# Patient Record
Sex: Male | Born: 1935 | Hispanic: No | Smoking: Never smoker
Health system: Southern US, Community
[De-identification: ages and names within clinical notes are randomized; demographics above are authoritative.]

## PROBLEM LIST (undated history)

## (undated) DIAGNOSIS — H409 Unspecified glaucoma: Secondary | ICD-10-CM

## (undated) DIAGNOSIS — I739 Peripheral vascular disease, unspecified: Secondary | ICD-10-CM

## (undated) DIAGNOSIS — G4730 Sleep apnea, unspecified: Secondary | ICD-10-CM

## (undated) DIAGNOSIS — J45909 Unspecified asthma, uncomplicated: Secondary | ICD-10-CM

## (undated) DIAGNOSIS — I2510 Atherosclerotic heart disease of native coronary artery without angina pectoris: Secondary | ICD-10-CM

## (undated) DIAGNOSIS — E785 Hyperlipidemia, unspecified: Secondary | ICD-10-CM

## (undated) DIAGNOSIS — I639 Cerebral infarction, unspecified: Secondary | ICD-10-CM

## (undated) DIAGNOSIS — I509 Heart failure, unspecified: Secondary | ICD-10-CM

## (undated) DIAGNOSIS — M1990 Unspecified osteoarthritis, unspecified site: Secondary | ICD-10-CM

## (undated) DIAGNOSIS — Z8673 Personal history of transient ischemic attack (TIA), and cerebral infarction without residual deficits: Secondary | ICD-10-CM

## (undated) DIAGNOSIS — K219 Gastro-esophageal reflux disease without esophagitis: Secondary | ICD-10-CM

## (undated) DIAGNOSIS — I10 Essential (primary) hypertension: Secondary | ICD-10-CM

## (undated) DIAGNOSIS — F419 Anxiety disorder, unspecified: Secondary | ICD-10-CM

## (undated) HISTORY — DX: Sleep apnea, unspecified: G47.30

## (undated) HISTORY — DX: Unspecified asthma, uncomplicated: J45.909

## (undated) HISTORY — PX: REPLACEMENT TOTAL KNEE BILATERAL: SUR1225

## (undated) HISTORY — DX: Personal history of transient ischemic attack (TIA), and cerebral infarction without residual deficits: Z86.73

## (undated) HISTORY — DX: Unspecified glaucoma: H40.9

## (undated) HISTORY — DX: Unspecified osteoarthritis, unspecified site: M19.90

## (undated) HISTORY — DX: Heart failure, unspecified: I50.9

## (undated) HISTORY — DX: Hyperlipidemia, unspecified: E78.5

## (undated) HISTORY — DX: Essential (primary) hypertension: I10

## (undated) HISTORY — DX: Atherosclerotic heart disease of native coronary artery without angina pectoris: I25.10

## (undated) HISTORY — DX: Anxiety disorder, unspecified: F41.9

## (undated) HISTORY — DX: Peripheral vascular disease, unspecified: I73.9

## (undated) HISTORY — DX: Cerebral infarction, unspecified: I63.9

## (undated) HISTORY — DX: Gastro-esophageal reflux disease without esophagitis: K21.9

---

## 2004-01-18 ENCOUNTER — Ambulatory Visit: Payer: Self-pay

## 2004-12-05 ENCOUNTER — Ambulatory Visit: Payer: Self-pay | Admitting: Internal Medicine

## 2004-12-12 ENCOUNTER — Ambulatory Visit: Payer: Self-pay | Admitting: Internal Medicine

## 2005-01-12 ENCOUNTER — Ambulatory Visit: Payer: Self-pay | Admitting: Internal Medicine

## 2005-06-05 ENCOUNTER — Other Ambulatory Visit: Payer: Self-pay

## 2005-06-13 ENCOUNTER — Ambulatory Visit: Payer: Self-pay | Admitting: Otolaryngology

## 2005-07-28 ENCOUNTER — Emergency Department: Payer: Self-pay | Admitting: Emergency Medicine

## 2005-07-30 ENCOUNTER — Ambulatory Visit: Payer: Self-pay

## 2008-02-04 ENCOUNTER — Inpatient Hospital Stay: Payer: Self-pay | Admitting: Internal Medicine

## 2009-01-18 ENCOUNTER — Ambulatory Visit: Payer: Self-pay | Admitting: Podiatry

## 2009-05-04 ENCOUNTER — Ambulatory Visit: Payer: Self-pay | Admitting: General Practice

## 2009-05-17 ENCOUNTER — Inpatient Hospital Stay: Payer: Self-pay | Admitting: General Practice

## 2009-05-23 ENCOUNTER — Ambulatory Visit: Payer: Self-pay | Admitting: Cardiology

## 2009-12-30 ENCOUNTER — Emergency Department: Payer: Self-pay | Admitting: Emergency Medicine

## 2010-01-16 ENCOUNTER — Ambulatory Visit: Payer: Self-pay | Admitting: Neurology

## 2010-01-18 ENCOUNTER — Ambulatory Visit: Payer: Self-pay | Admitting: Vascular Surgery

## 2010-09-19 ENCOUNTER — Inpatient Hospital Stay: Payer: Self-pay | Admitting: Emergency Medicine

## 2010-09-22 LAB — CANCER ANTIGEN 19-9: CA 19-9: 57 U/mL — ABNORMAL HIGH

## 2010-09-28 LAB — PATHOLOGY REPORT

## 2011-03-23 ENCOUNTER — Emergency Department: Payer: Self-pay | Admitting: Emergency Medicine

## 2011-03-23 LAB — URINALYSIS, COMPLETE
Ketone: NEGATIVE
Nitrite: NEGATIVE
Ph: 5 (ref 4.5–8.0)
Protein: NEGATIVE
RBC,UR: 3 /HPF (ref 0–5)
Specific Gravity: 1.019 (ref 1.003–1.030)
WBC UR: 2 /HPF (ref 0–5)

## 2011-03-23 LAB — CBC
HCT: 50 % (ref 40.0–52.0)
MCV: 91 fL (ref 80–100)
RBC: 5.49 10*6/uL (ref 4.40–5.90)
WBC: 8.5 10*3/uL (ref 3.8–10.6)

## 2011-03-23 LAB — TROPONIN I: Troponin-I: <0.02 ng/mL

## 2011-03-23 LAB — BASIC METABOLIC PANEL
Anion Gap: 12 (ref 7–16)
Calcium, Total: 8.4 mg/dL — ABNORMAL LOW (ref 8.5–10.1)
Co2: 25 mmol/L (ref 21–32)
Creatinine: 1.17 mg/dL (ref 0.60–1.30)
EGFR (African American): >60
EGFR (Non-African Amer.): >60
Glucose: 126 mg/dL — ABNORMAL HIGH (ref 65–99)
Sodium: 150 mmol/L — ABNORMAL HIGH (ref 136–145)

## 2011-04-18 ENCOUNTER — Encounter: Payer: Self-pay | Admitting: Otolaryngology

## 2011-05-06 ENCOUNTER — Encounter: Payer: Self-pay | Admitting: Otolaryngology

## 2011-06-05 ENCOUNTER — Encounter: Payer: Self-pay | Admitting: Otolaryngology

## 2012-01-23 ENCOUNTER — Ambulatory Visit: Payer: Self-pay | Admitting: Unknown Physician Specialty

## 2012-03-18 DIAGNOSIS — N401 Enlarged prostate with lower urinary tract symptoms: Secondary | ICD-10-CM | POA: Insufficient documentation

## 2012-03-18 DIAGNOSIS — R350 Frequency of micturition: Secondary | ICD-10-CM | POA: Insufficient documentation

## 2012-03-18 DIAGNOSIS — R351 Nocturia: Secondary | ICD-10-CM | POA: Insufficient documentation

## 2012-03-18 DIAGNOSIS — N529 Male erectile dysfunction, unspecified: Secondary | ICD-10-CM | POA: Insufficient documentation

## 2012-03-18 DIAGNOSIS — N400 Enlarged prostate without lower urinary tract symptoms: Secondary | ICD-10-CM | POA: Insufficient documentation

## 2012-04-22 ENCOUNTER — Ambulatory Visit: Payer: Self-pay | Admitting: Specialist

## 2012-09-19 ENCOUNTER — Emergency Department: Payer: Self-pay | Admitting: Emergency Medicine

## 2012-09-19 LAB — BASIC METABOLIC PANEL
Anion Gap: 6 — ABNORMAL LOW (ref 7–16)
BUN: 17 mg/dL (ref 7–18)
Calcium, Total: 8.7 mg/dL (ref 8.5–10.1)
Glucose: 129 mg/dL — ABNORMAL HIGH (ref 65–99)
Sodium: 141 mmol/L (ref 136–145)

## 2012-09-19 LAB — CBC
HCT: 50.1 % (ref 40.0–52.0)
HGB: 17.2 g/dL (ref 13.0–18.0)
MCH: 30.8 pg (ref 26.0–34.0)
MCHC: 34.3 g/dL (ref 32.0–36.0)
MCV: 90 fL (ref 80–100)
RBC: 5.57 10*6/uL (ref 4.40–5.90)
RDW: 15.1 % — ABNORMAL HIGH (ref 11.5–14.5)
WBC: 8.5 10*3/uL (ref 3.8–10.6)

## 2012-09-20 LAB — URINALYSIS, COMPLETE
Bacteria: NONE SEEN
Leukocyte Esterase: NEGATIVE
Specific Gravity: 1.013 (ref 1.003–1.030)
WBC UR: 1 /HPF (ref 0–5)

## 2012-09-20 LAB — TROPONIN I: Troponin-I: <0.02 ng/mL

## 2012-09-28 ENCOUNTER — Ambulatory Visit: Payer: Self-pay | Admitting: Internal Medicine

## 2012-09-28 LAB — CREATININE, SERUM
EGFR (African American): >60
EGFR (Non-African Amer.): >60

## 2012-11-11 ENCOUNTER — Ambulatory Visit: Payer: Self-pay | Admitting: Specialist

## 2013-04-06 DIAGNOSIS — N281 Cyst of kidney, acquired: Secondary | ICD-10-CM | POA: Insufficient documentation

## 2013-05-18 ENCOUNTER — Ambulatory Visit: Payer: Self-pay | Admitting: Specialist

## 2013-06-14 DIAGNOSIS — R918 Other nonspecific abnormal finding of lung field: Secondary | ICD-10-CM | POA: Insufficient documentation

## 2013-06-14 DIAGNOSIS — G4733 Obstructive sleep apnea (adult) (pediatric): Secondary | ICD-10-CM | POA: Insufficient documentation

## 2014-03-03 DIAGNOSIS — D696 Thrombocytopenia, unspecified: Secondary | ICD-10-CM | POA: Insufficient documentation

## 2015-07-16 DIAGNOSIS — R82991 Hypocitraturia: Secondary | ICD-10-CM | POA: Insufficient documentation

## 2016-02-16 ENCOUNTER — Ambulatory Visit (INDEPENDENT_AMBULATORY_CARE_PROVIDER_SITE_OTHER): Payer: Self-pay | Admitting: Vascular Surgery

## 2016-02-16 ENCOUNTER — Encounter (INDEPENDENT_AMBULATORY_CARE_PROVIDER_SITE_OTHER): Payer: Self-pay

## 2016-03-04 ENCOUNTER — Other Ambulatory Visit (INDEPENDENT_AMBULATORY_CARE_PROVIDER_SITE_OTHER): Payer: Self-pay | Admitting: Vascular Surgery

## 2016-03-04 DIAGNOSIS — I6523 Occlusion and stenosis of bilateral carotid arteries: Secondary | ICD-10-CM

## 2016-03-05 ENCOUNTER — Ambulatory Visit (INDEPENDENT_AMBULATORY_CARE_PROVIDER_SITE_OTHER): Payer: Medicare Other

## 2016-03-05 ENCOUNTER — Ambulatory Visit (INDEPENDENT_AMBULATORY_CARE_PROVIDER_SITE_OTHER): Payer: Medicare Other | Admitting: Vascular Surgery

## 2016-03-05 ENCOUNTER — Encounter (INDEPENDENT_AMBULATORY_CARE_PROVIDER_SITE_OTHER): Payer: Self-pay | Admitting: Vascular Surgery

## 2016-03-05 VITALS — BP 129/72 | HR 53 | Resp 18 | Ht 69.0 in | Wt 236.0 lb

## 2016-03-05 DIAGNOSIS — I6529 Occlusion and stenosis of unspecified carotid artery: Secondary | ICD-10-CM | POA: Insufficient documentation

## 2016-03-05 DIAGNOSIS — I6523 Occlusion and stenosis of bilateral carotid arteries: Secondary | ICD-10-CM | POA: Diagnosis not present

## 2016-03-05 DIAGNOSIS — I10 Essential (primary) hypertension: Secondary | ICD-10-CM | POA: Insufficient documentation

## 2016-03-05 DIAGNOSIS — E785 Hyperlipidemia, unspecified: Secondary | ICD-10-CM

## 2016-03-05 NOTE — Assessment & Plan Note (Signed)
The patient remains asymptomatic and has tolerated medical management with Plavix and Crestor. He continues to have less than 50% carotid artery stenosis on duplex today which is stable from his previous studies. There is no role for revascularization at this degree of stenosis. He will continue his medical management and I will see him back in 1 year with a carotid duplex.

## 2016-03-05 NOTE — Assessment & Plan Note (Signed)
lipid control important in reducing the progression of atherosclerotic disease. Continue statin therapy  

## 2016-03-05 NOTE — Progress Notes (Signed)
MRN : [id number]  Chris Kent is a 81 y.o. (09/10/1935) male who presents with chief complaint of  Chief Complaint  Patient presents with  . Re-evaluation    1 year carotid  .  History of Present Illness: Patient returns in follow-up of his carotid disease. He is doing well without any major issues or problems since his last visit 1 year ago. He denies focal neurologic symptoms. Specifically, the patient denies amaurosis fugax, speech or swallowing difficulties, or arm or leg weakness or numbness. He continues to have less than 50% carotid artery stenosis on duplex today which is stable from his previous studies.  Current Outpatient Prescriptions  Medication Sig Dispense Refill  . albuterol (PROVENTIL HFA;VENTOLIN HFA) 108 (90 Base) MCG/ACT inhaler Inhale into the lungs.    Marland Kitchen amLODipine (NORVASC) 5 MG tablet Take 5 mg by mouth.    Marland Kitchen BREO ELLIPTA 200-25 MCG/INH AEPB INL 1 PUFF INTO THE LUNGS QD  5  . clopidogrel (PLAVIX) 75 MG tablet Take by mouth.    . Dorzolamide HCl-Timolol Mal PF 22.3-6.8 MG/ML SOLN Apply to eye.    . dorzolamide-timolol (COSOPT) 22.3-6.8 MG/ML ophthalmic solution     . fluticasone (FLONASE) 50 MCG/ACT nasal spray SHAKE LQ AND U 2 SPRAYS IEN QD PRF RHINITIS OR ALLERGIES    . fluticasone (FLONASE) 50 MCG/ACT nasal spray     . HYDROMET 5-1.5 MG/5ML syrup TK 5 ML PO Q 6 H PRN FOR COUGH  0  . latanoprost (XALATAN) 0.005 % ophthalmic solution INSTILL 1 DROP OU QHS  0  . losartan (COZAAR) 50 MG tablet Take 50 mg by mouth.    . metoprolol succinate (TOPROL-XL) 100 MG 24 hr tablet     . omeprazole (PRILOSEC) 20 MG capsule TK ONE C PO BID  3  . rosuvastatin (CRESTOR) 10 MG tablet Take 10 mg by mouth.    . sildenafil (REVATIO) 20 MG tablet 2-5 tablets 1 hr prior to intercourse as needed    . tamsulosin (FLOMAX) 0.4 MG CAPS capsule Take 0.4 mg by mouth.    . zolpidem (AMBIEN) 10 MG tablet      No current facility-administered medications for this visit.     Past  Medical History:  Diagnosis Date  . Glaucoma   . Hyperlipidemia   . Hypertension   . Peripheral vascular disease ([employer])     No past surgical history on file.  Social History Social History  Substance Use Topics  . Smoking status: Never Smoker  . Smokeless tobacco: Never Used  . Alcohol use No     Family History No bleeding or clotting disorders  No Known Allergies   REVIEW OF SYSTEMS (Negative unless checked)  Constitutional: [] Weight loss  [] Fever  [] Chills Cardiac: [] Chest pain   [] Chest pressure   [] Palpitations   [] Shortness of breath when laying flat   [] Shortness of breath at rest   [x] Shortness of breath with exertion. Vascular:  [] Pain in legs with walking   [] Pain in legs at rest   [] Pain in legs when laying flat   [] Claudication   [] Pain in feet when walking  [] Pain in feet at rest  [] Pain in feet when laying flat   [] History of DVT   [] Phlebitis   [x] Swelling in legs   [] Varicose veins   [] Non-healing ulcers Pulmonary:   [] Uses home oxygen   [] Productive cough   [] Hemoptysis   [] Wheeze  [x] COPD   [] Asthma Neurologic:  [] Dizziness  [] Blackouts   [] Seizures   []   History of stroke   [] History of TIA  [] Aphasia   [] Temporary blindness   [] Dysphagia   [] Weakness or numbness in arms   [] Weakness or numbness in legs Musculoskeletal:  [x] Arthritis   [] Joint swelling   [x] Joint pain   [] Low back pain Hematologic:  [] Easy bruising  [] Easy bleeding   [] Hypercoagulable state   [] Anemic  [] Hepatitis Gastrointestinal:  [] Blood in stool   [] Vomiting blood  [] Gastroesophageal reflux/heartburn   [] Difficulty swallowing. Genitourinary:  [] Chronic kidney disease   [] Difficult urination  [] Frequent urination  [] Burning with urination   [] Blood in urine Skin:  [] Rashes   [] Ulcers   [] Wounds Psychological:  [] History of anxiety   []  History of major depression.  Physical Examination  Vitals:   03/05/16 0948 03/05/16 0949  BP: 133/70 129/72  Pulse: (!) 53   Resp: 18   Weight: 236 lb  (107 kg)   Height: 5\' 9"  (1.753 m)    Body mass index is 34.85 kg/m. Gen:  WD/WN, NAD Head: [place]/AT, No temporalis wasting. Ear/Nose/Throat: Hearing grossly intact, nares w/o erythema or drainage, trachea midline Eyes: Conjunctiva clear. Sclera non-icteric Neck: Supple.  No bruit or JVD.  Pulmonary:  Good air movement, equal and clear to auscultation bilaterally.  Cardiac: RRR, normal S1, S2, no Murmurs, rubs or gallops. Vascular:  Vessel Right Left  Radial Palpable Palpable  Ulnar Palpable Palpable  Brachial Palpable Palpable  Carotid Palpable, without bruit Palpable, without bruit  Aorta Not palpable N/A  Femoral Palpable Palpable  Popliteal Palpable Palpable  PT Palpable Palpable  DP Palpable Palpable   Gastrointestinal: soft, non-tender/non-distended. No guarding/reflex.  Musculoskeletal: M/S 5/5 throughout.  No deformity or atrophy.  Neurologic: CN 2-12 intact. Sensation grossly intact in extremities.  Symmetrical.  Speech is fluent. Motor exam as listed above. Psychiatric: Judgment intact, Mood & affect appropriate for pt's clinical situation. Dermatologic: No rashes or ulcers noted.  No cellulitis or open wounds. Lymph : No Cervical, Axillary, or Inguinal lymphadenopathy.     CBC Lab Results  Component Value Date   WBC 8.5 09/19/2012   HGB 17.2 09/19/2012   HCT 50.1 09/19/2012   MCV 90 09/19/2012   PLT 154 09/19/2012    BMET    Component Value Date/Time   NA 141 09/19/2012 2200   K 4.2 09/19/2012 2200   CL 110 (H) 09/19/2012 2200   CO2 25 09/19/2012 2200   GLUCOSE 129 (H) 09/19/2012 2200   BUN 17 09/19/2012 2200   CREATININE 0.96 09/28/2012 1214   CALCIUM 8.7 09/19/2012 2200   GFRNONAA >60 09/28/2012 1214   GFRAA >60 09/28/2012 1214   CrCl cannot be calculated (Patient's most recent lab result is older than the maximum 21 days allowed.).  COAG No results found for: INR, PROTIME  Radiology No results found.    Assessment/Plan Essential  hypertension, benign blood pressure control important in reducing the progression of atherosclerotic disease. On appropriate oral medications.   Hyperlipidemia lipid control important in reducing the progression of atherosclerotic disease. Continue statin therapy   Carotid stenosis The patient remains asymptomatic and has tolerated medical management with Plavix and Crestor. He continues to have less than 50% carotid artery stenosis on duplex today which is stable from his previous studies. There is no role for revascularization at this degree of stenosis. He will continue his medical management and I will see him back in 1 year with a carotid duplex.    Leotis Pain, MD  03/05/2016 10:15 AM    This note was  created with Dragon medical transcription system.  Any errors from dictation are purely unintentional

## 2016-03-05 NOTE — Assessment & Plan Note (Signed)
blood pressure control important in reducing the progression of atherosclerotic disease. On appropriate oral medications.  

## 2017-01-12 DIAGNOSIS — Z96653 Presence of artificial knee joint, bilateral: Secondary | ICD-10-CM | POA: Insufficient documentation

## 2017-03-07 ENCOUNTER — Ambulatory Visit (INDEPENDENT_AMBULATORY_CARE_PROVIDER_SITE_OTHER): Payer: Medicare Other | Admitting: Vascular Surgery

## 2017-03-07 ENCOUNTER — Encounter (INDEPENDENT_AMBULATORY_CARE_PROVIDER_SITE_OTHER): Payer: Self-pay | Admitting: Vascular Surgery

## 2017-03-07 ENCOUNTER — Ambulatory Visit (INDEPENDENT_AMBULATORY_CARE_PROVIDER_SITE_OTHER): Payer: Medicare Other

## 2017-03-07 VITALS — BP 116/64 | HR 55 | Resp 17 | Ht 69.0 in | Wt 236.0 lb

## 2017-03-07 DIAGNOSIS — I10 Essential (primary) hypertension: Secondary | ICD-10-CM | POA: Diagnosis not present

## 2017-03-07 DIAGNOSIS — E785 Hyperlipidemia, unspecified: Secondary | ICD-10-CM | POA: Diagnosis not present

## 2017-03-07 DIAGNOSIS — I6523 Occlusion and stenosis of bilateral carotid arteries: Secondary | ICD-10-CM

## 2017-03-07 NOTE — Patient Instructions (Signed)
Carotid Artery Disease The carotid arteries are arteries on both sides of the neck. They carry blood to the brain. Carotid artery disease is when the arteries get smaller (narrow) or get blocked. If these arteries get smaller or get blocked, you are more likely to have a stroke or warning stroke (transient ischemic attack). Follow these instructions at home:  Take medicines as told by your doctor. Make sure you understand all your medicine instructions. Do not stop your medicines without talking to your doctor first.  Follow your doctor's diet instructions. It is important to eat a healthy diet that includes plenty of: ? Fresh fruits. ? Vegetables. ? Lean meats.  Avoid: ? High-fat foods. ? High-sodium foods. ? Foods that are fried, overly processed, or have poor nutritional value.  Stay a healthy weight.  Stay active. Get at least 30 minutes of activity every day.  Do not smoke.  Limit alcohol use to: ? No more than 2 drinks a day for men. ? No more than 1 drink a day for women who are not pregnant.  Do not use illegal drugs.  Keep all doctor visits as told. Get help right away if:  You have sudden weakness or loss of feeling (numbness) on one side of the body, such as the face, arm, or leg.  You have sudden confusion.  You have trouble speaking (aphasia) or understanding.  You have sudden trouble seeing out of one or both eyes.  You have sudden trouble walking.  You have dizziness or feel like you might pass out (faint).  You have a loss of balance or your movements are not steady (uncoordinated).  You have a sudden, severe headache with no known cause.  You have trouble swallowing (dysphagia). Call your local emergency services (911 in [place] Do notdrive yourself to the clinic or hospital. This information is not intended to replace advice given to you by your health care provider. Make sure you discuss any questions you have with your health care  provider. Document Released: 01/08/2012 Document Revised: 06/29/2015 Document Reviewed: 07/22/2012 Elsevier Interactive Patient Education  2018 [employer] Inc[employer]  

## 2017-03-07 NOTE — Progress Notes (Signed)
MRN : [id number]  Chris Kent is a 82 y.o. (12-May-1935) male who presents with chief complaint of  Chief Complaint  Patient presents with  . Carotid    1 year carotid f/u  .  History of Present Illness: Patient returns in follow-up of his carotid artery disease.  He is doing well without any focal neurologic symptoms. Specifically, the patient denies amaurosis fugax, speech or swallowing difficulties, or arm or leg weakness or numbness.  He has no new complaints today.  His carotid duplex today shows mild carotid artery stenosis in the bilaterally without significant progression from his previous studies.  Current Outpatient Prescriptions  Medication Sig Dispense Refill  . albuterol (PROVENTIL HFA;VENTOLIN HFA) 108 (90 Base) MCG/ACT inhaler Inhale into the lungs.    Marland Kitchen amLODipine (NORVASC) 5 MG tablet Take 5 mg by mouth.    Marland Kitchen BREO ELLIPTA 200-25 MCG/INH AEPB INL 1 PUFF INTO THE LUNGS QD  5  . clopidogrel (PLAVIX) 75 MG tablet Take by mouth.    . Dorzolamide HCl-Timolol Mal PF 22.3-6.8 MG/ML SOLN Apply to eye.    . dorzolamide-timolol (COSOPT) 22.3-6.8 MG/ML ophthalmic solution     . fluticasone (FLONASE) 50 MCG/ACT nasal spray SHAKE LQ AND U 2 SPRAYS IEN QD PRF RHINITIS OR ALLERGIES    . fluticasone (FLONASE) 50 MCG/ACT nasal spray     . HYDROMET 5-1.5 MG/5ML syrup TK 5 ML PO Q 6 H PRN FOR COUGH  0  . latanoprost (XALATAN) 0.005 % ophthalmic solution INSTILL 1 DROP OU QHS  0  . losartan (COZAAR) 50 MG tablet Take 50 mg by mouth.    . metoprolol succinate (TOPROL-XL) 100 MG 24 hr tablet     . omeprazole (PRILOSEC) 20 MG capsule TK ONE C PO BID  3  . rosuvastatin (CRESTOR) 10 MG tablet Take 10 mg by mouth.    . sildenafil (REVATIO) 20 MG tablet 2-5 tablets 1 hr prior to intercourse as needed    . tamsulosin (FLOMAX) 0.4 MG CAPS capsule Take 0.4 mg by mouth.    . zolpidem (AMBIEN) 10 MG tablet      No current facility-administered medications for  this visit.         Past Medical History:  Diagnosis Date  . Glaucoma   . Hyperlipidemia   . Hypertension   . Peripheral vascular disease ([place])     No past surgical history on file.  Social History     Social History  Substance Use Topics  . Smoking status: Never Smoker  . Smokeless tobacco: Never Used  . Alcohol use No     Family History No bleeding or clotting disorders  No Known Allergies   REVIEW OF SYSTEMS (Negative unless checked)  Constitutional: [] Weight loss  [] Fever  [] Chills Cardiac: [] Chest pain   [] Chest pressure   [] Palpitations   [] Shortness of breath when laying flat   [] Shortness of breath at rest   [x] Shortness of breath with exertion. Vascular:  [] Pain in legs with walking   [] Pain in legs at rest   [] Pain in legs when laying flat   [] Claudication   [] Pain in feet when walking  [] Pain in feet at rest  [] Pain in feet when laying flat   [] History of DVT   [] Phlebitis   [x] Swelling in legs   [] Varicose veins   [] Non-healing ulcers Pulmonary:   [] Uses home oxygen   [] Productive cough   [] Hemoptysis   [] Wheeze  [x] COPD   [] Asthma Neurologic:  [] Dizziness  []   Blackouts   [] Seizures   [] History of stroke   [] History of TIA  [] Aphasia   [] Temporary blindness   [] Dysphagia   [] Weakness or numbness in arms   [] Weakness or numbness in legs Musculoskeletal:  [x] Arthritis   [] Joint swelling   [x] Joint pain   [] Low back pain Hematologic:  [] Easy bruising  [] Easy bleeding   [] Hypercoagulable state   [] Anemic  [] Hepatitis Gastrointestinal:  [] Blood in stool   [] Vomiting blood  [] Gastroesophageal reflux/heartburn   [] Difficulty swallowing. Genitourinary:  [] Chronic kidney disease   [] Difficult urination  [] Frequent urination  [] Burning with urination   [] Blood in urine Skin:  [] Rashes   [] Ulcers   [] Wounds Psychological:  [] History of anxiety   []  History of major depression.      Physical Examination  Vitals:   03/07/17 0959 03/07/17 1000  BP:  125/68 116/64  Pulse: 63 (!) 55  Resp: 17   Weight: 236 lb (107 kg)   Height: 5\' 9"  (1.753 m)    Body mass index is 34.85 kg/m. Gen:  WD/WN, NAD Head: [place]/AT, No temporalis wasting. Ear/Nose/Throat: Hearing decreased, nares w/o erythema or drainage, trachea midline Eyes: Conjunctiva clear. Sclera non-icteric Neck: Supple.  No bruit.  Pulmonary:  Good air movement, equal and clear to auscultation bilaterally.  Cardiac: RRR, no JVD Vascular:  Vessel Right Left  Radial Palpable Palpable                                    Musculoskeletal: M/S 5/5 throughout.  No deformity or atrophy. Trace LE edema. Neurologic: CN 2-12 intact. Sensation grossly intact in extremities.  Symmetrical.  Speech is fluent. Motor exam as listed above. Psychiatric: Judgment intact, Mood & affect appropriate for pt's clinical situation. Dermatologic: No rashes or ulcers noted.  No cellulitis or open wounds.      CBC Lab Results  Component Value Date   WBC 8.5 09/19/2012   HGB 17.2 09/19/2012   HCT 50.1 09/19/2012   MCV 90 09/19/2012   PLT 154 09/19/2012    BMET    Component Value Date/Time   NA 141 09/19/2012 2200   K 4.2 09/19/2012 2200   CL 110 (H) 09/19/2012 2200   CO2 25 09/19/2012 2200   GLUCOSE 129 (H) 09/19/2012 2200   BUN 17 09/19/2012 2200   CREATININE 0.96 09/28/2012 1214   CALCIUM 8.7 09/19/2012 2200   GFRNONAA >60 09/28/2012 1214   GFRAA >60 09/28/2012 1214   CrCl cannot be calculated (Patient's most recent lab result is older than the maximum 21 days allowed.).  COAG No results found for: INR, PROTIME  Radiology No results found.    Assessment/Plan Essential hypertension, benign blood pressure control important in reducing the progression of atherosclerotic disease. On appropriate oral medications.   Hyperlipidemia lipid control important in reducing the progression of atherosclerotic disease. Continue statin therapy   Carotid stenosis The patient  remains asymptomatic and has tolerated medical management with Plavix and Crestor. He continues to have less than 50% carotid artery stenosis on duplex today which is stable from his previous studies. There is no role for revascularization at this degree of stenosis. He will continue his medical management and I will see him back in 1 year with a carotid duplex.      Leotis Pain, MD  03/07/2017 3:11 PM    This note was created with Dragon medical transcription system.  Any errors from dictation are  purely unintentional

## 2017-07-08 ENCOUNTER — Other Ambulatory Visit: Payer: Self-pay | Admitting: Internal Medicine

## 2017-07-08 DIAGNOSIS — R911 Solitary pulmonary nodule: Secondary | ICD-10-CM

## 2017-07-08 DIAGNOSIS — R053 Chronic cough: Secondary | ICD-10-CM

## 2017-07-08 DIAGNOSIS — R61 Generalized hyperhidrosis: Secondary | ICD-10-CM

## 2017-07-08 DIAGNOSIS — R05 Cough: Secondary | ICD-10-CM

## 2017-07-16 ENCOUNTER — Ambulatory Visit
Admission: RE | Admit: 2017-07-16 | Discharge: 2017-07-16 | Disposition: A | Discharge status: Home / Self Care | Payer: Medicare Other | Source: Ambulatory Visit | Attending: Internal Medicine | Admitting: Internal Medicine

## 2017-07-16 DIAGNOSIS — J8410 Pulmonary fibrosis, unspecified: Secondary | ICD-10-CM | POA: Insufficient documentation

## 2017-07-16 DIAGNOSIS — R05 Cough: Secondary | ICD-10-CM | POA: Diagnosis present

## 2017-07-16 DIAGNOSIS — R911 Solitary pulmonary nodule: Secondary | ICD-10-CM

## 2017-07-16 DIAGNOSIS — R61 Generalized hyperhidrosis: Secondary | ICD-10-CM | POA: Diagnosis present

## 2017-07-16 DIAGNOSIS — R918 Other nonspecific abnormal finding of lung field: Secondary | ICD-10-CM | POA: Insufficient documentation

## 2017-07-16 DIAGNOSIS — I700 Atherosclerosis of aorta: Secondary | ICD-10-CM | POA: Insufficient documentation

## 2017-07-16 DIAGNOSIS — R053 Chronic cough: Secondary | ICD-10-CM

## 2017-07-17 ENCOUNTER — Encounter: Payer: Self-pay | Admitting: Emergency Medicine

## 2017-07-17 ENCOUNTER — Other Ambulatory Visit: Payer: Self-pay

## 2017-07-17 ENCOUNTER — Emergency Department
Admission: EM | Admit: 2017-07-17 | Discharge: 2017-07-17 | Disposition: A | Discharge status: Home / Self Care | Payer: Medicare Other | Attending: Emergency Medicine | Admitting: Emergency Medicine

## 2017-07-17 DIAGNOSIS — Z79899 Other long term (current) drug therapy: Secondary | ICD-10-CM | POA: Insufficient documentation

## 2017-07-17 DIAGNOSIS — J181 Lobar pneumonia, unspecified organism: Secondary | ICD-10-CM | POA: Insufficient documentation

## 2017-07-17 DIAGNOSIS — J189 Pneumonia, unspecified organism: Secondary | ICD-10-CM

## 2017-07-17 DIAGNOSIS — R42 Dizziness and giddiness: Secondary | ICD-10-CM | POA: Diagnosis present

## 2017-07-17 DIAGNOSIS — I10 Essential (primary) hypertension: Secondary | ICD-10-CM | POA: Insufficient documentation

## 2017-07-17 LAB — BASIC METABOLIC PANEL
ANION GAP: 7 (ref 5–15)
BUN: 20 mg/dL (ref 6–20)
CALCIUM: 8.5 mg/dL — AB (ref 8.9–10.3)
CHLORIDE: 109 mmol/L (ref 101–111)
CO2: 23 mmol/L (ref 22–32)
CREATININE: 1.03 mg/dL (ref 0.61–1.24)
GFR calc non Af Amer: >60 mL/min (ref >60)
Glucose, Bld: 115 mg/dL — ABNORMAL HIGH (ref 65–99)
Potassium: 4.1 mmol/L (ref 3.5–5.1)
SODIUM: 139 mmol/L (ref 135–145)

## 2017-07-17 LAB — URINALYSIS, COMPLETE (UACMP) WITH MICROSCOPIC
BILIRUBIN URINE: NEGATIVE
Bacteria, UA: NONE SEEN
GLUCOSE, UA: NEGATIVE mg/dL
KETONES UR: NEGATIVE mg/dL
LEUKOCYTES UA: NEGATIVE
Nitrite: NEGATIVE
PROTEIN: NEGATIVE mg/dL
Specific Gravity, Urine: 1.009 (ref 1.005–1.030)
Squamous Epithelial / LPF: NONE SEEN (ref 0–5)
pH: 7 (ref 5.0–8.0)

## 2017-07-17 LAB — CBC
HCT: 48 % (ref 40.0–52.0)
HEMOGLOBIN: 15.8 g/dL (ref 13.0–18.0)
MCH: 30.6 pg (ref 26.0–34.0)
MCHC: 33 g/dL (ref 32.0–36.0)
MCV: 92.6 fL (ref 80.0–100.0)
PLATELETS: 119 10*3/uL — AB (ref 150–440)
RBC: 5.18 MIL/uL (ref 4.40–5.90)
RDW: 14.7 % — ABNORMAL HIGH (ref 11.5–14.5)
WBC: 7.1 10*3/uL (ref 3.8–10.6)

## 2017-07-17 LAB — TROPONIN I: Troponin I: <0.03 ng/mL (ref <0.03)

## 2017-07-17 MED ORDER — AZITHROMYCIN 250 MG PO TABS: ORAL_TABLET | ORAL | 0 refills | Status: AC

## 2017-07-17 NOTE — ED Provider Notes (Signed)
[facility] Emergency Department Provider Note   ____________________________________________   First MD Initiated Contact with Patient 07/17/17 1922     (approximate)  I have reviewed the triage vital signs and the nursing notes.   HISTORY  Chief Complaint No chief complaint on file. Chief complaint is "lightheadedness"  HPI Chris Kent is a 82 y.o. male who comes in today complaining of a "curious feeling" in his head.  He denies any feeling of passing out or lightheadedness he denies any spinning or vertigo or dizziness he just says is a "curious feeling" he is not having any nausea, vomiting, blurry vision, slurry speech, abdominal pain or any other complaints except for a dry cough that is been going on for months.  He does not have a headache or fever   Past Medical History:  Diagnosis Date  . Glaucoma   . Hyperlipidemia   . Hypertension   . Peripheral vascular disease [facility])     Patient Active Problem List   Diagnosis Date Noted  . Essential hypertension, benign 03/05/2016  . Hyperlipidemia 03/05/2016  . Carotid stenosis 03/05/2016    History reviewed. No pertinent surgical history.  Prior to Admission medications   Medication Sig Start Date End Date Taking? Authorizing Provider  albuterol (PROVENTIL HFA;VENTOLIN HFA) 108 (90 Base) MCG/ACT inhaler Inhale into the lungs. 02/27/16 02/26/17  [provider]  amLODipine (NORVASC) 5 MG tablet Take 5 mg by mouth. 04/07/12   [provider]  azithromycin (ZITHROMAX Z-PAK) 250 MG tablet Take 2 tablets (500 mg) on  Day 1,  followed by 1 tablet (250 mg) once daily on Days 2 through 5. 07/17/17 07/22/17  Nena Polio, MD  BREO ELLIPTA 200-25 MCG/INH AEPB INL 1 PUFF INTO THE LUNGS QD 12/29/15   [provider]  clopidogrel (PLAVIX) 75 MG tablet Take by mouth. 03/07/15   [provider]  cyclobenzaprine (FLEXERIL) 10 MG tablet  01/25/17   [provider]    Dorzolamide HCl-Timolol Mal PF 22.3-6.8 MG/ML SOLN Apply to eye.    [provider]  dorzolamide-timolol (COSOPT) 22.3-6.8 MG/ML ophthalmic solution  02/19/16   [provider]  fluticasone (FLONASE) 50 MCG/ACT nasal spray SHAKE LQ AND U 2 SPRAYS IEN QD PRF RHINITIS OR ALLERGIES 06/29/15   [provider]  fluticasone (FLONASE) 50 MCG/ACT nasal spray  02/09/16   [provider]  HYDROMET 5-1.5 MG/5ML syrup TK 5 ML PO Q 6 H PRN FOR COUGH 01/17/16   [provider]  latanoprost (XALATAN) 0.005 % ophthalmic solution INSTILL 1 DROP OU QHS 12/26/15   [provider]  losartan (COZAAR) 50 MG tablet Take 50 mg by mouth.    [provider]  metoprolol succinate (TOPROL-XL) 100 MG 24 hr tablet  02/15/16   [provider]  omeprazole (PRILOSEC) 20 MG capsule TK ONE C PO BID 12/06/15   [provider]  rosuvastatin (CRESTOR) 10 MG tablet Take 10 mg by mouth. 03/18/12   [provider]  sildenafil (REVATIO) 20 MG tablet 2-5 tablets 1 hr prior to intercourse as needed 04/21/15   [provider]  tamsulosin (FLOMAX) 0.4 MG CAPS capsule Take 0.4 mg by mouth.    [provider]  zolpidem (AMBIEN) 10 MG tablet  03/23/13   [provider]    Allergies Patient has no known allergies.  History reviewed. No pertinent family history.  Social History Social History   Tobacco Use  . Smoking status: Never Smoker  .  Smokeless tobacco: Never Used  Substance Use Topics  . Alcohol use: No  . Drug use: No    Review of Systems  Constitutional: No fever/chills Eyes: No visual changes. ENT: No sore throat. Cardiovascular: Denies chest pain. Respiratory: Denies shortness of breath. Gastrointestinal: No abdominal pain.  No nausea, no vomiting.  No diarrhea.  No constipation. Genitourinary: Negative for dysuria. Musculoskeletal: Negative for back pain. Skin: Negative for rash. Neurological: Negative  for headaches, focal weakness  Allergic/Immunilogical: **}  ____________________________________________   PHYSICAL EXAM:  VITAL SIGNS: ED Triage Vitals [07/17/17 1851]  Enc Vitals Group     BP (!) 158/68     Pulse Rate (!) 56     Resp 16     Temp 97.8 F (36.6 C)     Temp Source Oral     SpO2 96 %     Weight      Height      Head Circumference      Peak Flow      Pain Score 0     Pain Loc      Pain Edu?      Excl. in [place]?     Constitutional: Alert and oriented. Well appearing and in no acute distress. Eyes: Conjunctivae are normal. PERRL. EOMI. fundi look normal bilaterally Head: Atraumatic. Nose: No congestion/rhinnorhea. Mouth/Throat: Mucous membranes are moist.  Oropharynx non-erythematous. Neck: No stridor.   Cardiovascular: Normal rate, regular rhythm. Grossly normal heart sounds.  Good peripheral circulation. Respiratory: Normal respiratory effort.  No retractions. Lungs CTAB. Gastrointestinal: Soft and nontender. No distention. No abdominal bruits. No CVA tenderness. Musculoskeletal: No lower extremity tenderness bilateral edema edema.  Patient reports she has a bad right ankle and the edema is worse on the right.  This is old. Neurologic:  Normal speech and language. No gross focal neurologic deficits are appreciated.  Manual nerves II through XII are intact although visual fields were not checked cerebellar finger-nose rapid alternating movements and hands are normal motor strength is 5/5 throughout sensation is intact Skin:  Skin is warm, dry and intact. No rash noted. Psychiatric: Mood and affect are normal. Speech and behavior are normal.  ____________________________________________   LABS (all labs ordered are listed, but only abnormal results are displayed)  Labs Reviewed  BASIC METABOLIC PANEL - Abnormal; Notable for the following components:      Result Value   Glucose, Bld 115 (*)    Calcium 8.5 (*)    All other components within normal limits   CBC - Abnormal; Notable for the following components:   RDW 14.7 (*)    Platelets 119 (*)    All other components within normal limits  URINALYSIS, COMPLETE (UACMP) WITH MICROSCOPIC - Abnormal; Notable for the following components:   Color, Urine STRAW (*)    APPearance CLEAR (*)    Hgb urine dipstick SMALL (*)    All other components within normal limits  TROPONIN I  CBG MONITORING, ED   ____________________________________________  EKG EKG read and interpreted by me shows sinus bradycardia rate of 56 right bundle branch block and left anterior hemiblock no acute changes.  The only difference from previous EKG is that the QRS is slightly longer now. ______________________________________  RADIOLOGY  ED MD interpretation: Chest CT done yesterday shows some faint infiltrates in the right upper lobe.  Official radiology report(s): No results found.  ____________________________________________   PROCEDURES  Procedure(s) performed:   Procedures  Critical Care performed:   ____________________________________________   INITIAL IMPRESSION /  ASSESSMENT AND PLAN / ED COURSE   Discharge patient doing well he wants to go home everything is essentially negative except for the CT from yesterday.  I will let him go and follow-up with his doctor return here if he is worse.        ____________________________________________   FINAL CLINICAL IMPRESSION(S) / ED DIAGNOSES  Final diagnoses:  Community acquired pneumonia of right upper lobe of lung [facility])     ED Discharge Orders        Ordered    azithromycin (ZITHROMAX Z-PAK) 250 MG tablet     07/17/17 2059       Note:  This document was prepared using Dragon voice recognition software and may include unintentional dictation errors.    Nena Polio, MD 07/17/17 2204

## 2017-07-17 NOTE — ED Triage Notes (Signed)
Pt to ED via POV with c/o lightheadedness since yesterday. Denies any CP. VSS. Color WNL, Speech clear

## 2017-07-17 NOTE — Discharge Instructions (Signed)
Drink plenty of fluids.  Take the Zithromax as directed.  Follow-up with your primary care doctor in the next day or 2.  Return here if you are worse.  Worse includes t feeling in your head getting worse or running a fever vomiting or just feeling sicker.

## 2017-11-21 ENCOUNTER — Encounter: Payer: Self-pay | Admitting: Urology

## 2017-11-21 ENCOUNTER — Ambulatory Visit (INDEPENDENT_AMBULATORY_CARE_PROVIDER_SITE_OTHER): Payer: Medicare Other | Admitting: Urology

## 2017-11-21 VITALS — BP 122/63 | HR 58 | Ht 68.0 in | Wt 226.0 lb

## 2017-11-21 DIAGNOSIS — K209 Esophagitis, unspecified without bleeding: Secondary | ICD-10-CM | POA: Insufficient documentation

## 2017-11-21 DIAGNOSIS — N200 Calculus of kidney: Secondary | ICD-10-CM | POA: Diagnosis not present

## 2017-11-21 DIAGNOSIS — R739 Hyperglycemia, unspecified: Secondary | ICD-10-CM | POA: Insufficient documentation

## 2017-11-21 DIAGNOSIS — I10 Essential (primary) hypertension: Secondary | ICD-10-CM | POA: Insufficient documentation

## 2017-11-21 DIAGNOSIS — J45909 Unspecified asthma, uncomplicated: Secondary | ICD-10-CM | POA: Insufficient documentation

## 2017-11-21 DIAGNOSIS — E041 Nontoxic single thyroid nodule: Secondary | ICD-10-CM | POA: Insufficient documentation

## 2017-11-21 DIAGNOSIS — G454 Transient global amnesia: Secondary | ICD-10-CM | POA: Insufficient documentation

## 2017-11-21 DIAGNOSIS — M1990 Unspecified osteoarthritis, unspecified site: Secondary | ICD-10-CM | POA: Insufficient documentation

## 2017-11-21 DIAGNOSIS — H409 Unspecified glaucoma: Secondary | ICD-10-CM | POA: Insufficient documentation

## 2017-11-21 DIAGNOSIS — Z86010 Personal history of colonic polyps: Secondary | ICD-10-CM | POA: Insufficient documentation

## 2017-11-21 DIAGNOSIS — F419 Anxiety disorder, unspecified: Secondary | ICD-10-CM | POA: Insufficient documentation

## 2017-11-21 LAB — URINALYSIS, COMPLETE
BILIRUBIN UA: NEGATIVE
GLUCOSE, UA: NEGATIVE
Ketones, UA: NEGATIVE
LEUKOCYTES UA: NEGATIVE
Nitrite, UA: NEGATIVE
PROTEIN UA: NEGATIVE
Specific Gravity, UA: 1.025 (ref 1.005–1.030)
UUROB: 2 mg/dL — AB (ref 0.2–1.0)
pH, UA: 6 (ref 5.0–7.5)

## 2017-11-21 LAB — MICROSCOPIC EXAMINATION

## 2017-11-21 NOTE — Progress Notes (Signed)
11/21/2017 10:30 AM   Chris Kent 1936-01-17 [id number]  Referring provider: Idelle Crouch, MD [place] [facility] [place], [place] [address]  Chief Complaint  Patient presents with  . Nephrolithiasis    HPI: 82 year old male previously followed for recurrent nephrolithiasis. He was last seen at [facility] in June 2017.  24-hour urine study showed low urine volume and hypocitraturia.  He states he passed 2 stones in August 2019 and 2 in September 2019 and Dr. Doy Hutching recommended follow-up.  He is presently asymptomatic denies flank/abdominal or pelvic pain.  He has no bothersome lower urinary tract symptoms.  He gets up 2-3 times per night to void.  He is on tamsulosin.  PMH: Past Medical History:  Diagnosis Date  . Anxiety   . Arthritis   . Asthma   . GERD (gastroesophageal reflux disease)   . Glaucoma   . Glaucoma   . History of stroke   . Hyperlipidemia   . Hyperlipidemia   . Hypertension   . Peripheral vascular disease ([place])   . Sleep apnea     Surgical History: Past Surgical History:  Procedure Laterality Date  . REPLACEMENT TOTAL KNEE BILATERAL      Home Medications:  Allergies as of 11/21/2017   No Known Allergies     Medication List        Accurate as of 11/21/17 10:30 AM. Always use your most recent med list.          albuterol 108 (90 Base) MCG/ACT inhaler Commonly known as:  PROVENTIL HFA;VENTOLIN HFA Inhale into the lungs.   amLODipine 5 MG tablet Commonly known as:  NORVASC Take 5 mg by mouth.   BREO ELLIPTA 200-25 MCG/INH Aepb Generic drug:  fluticasone furoate-vilanterol INL 1 PUFF INTO THE LUNGS QD   cetirizine 10 MG tablet Commonly known as:  ZYRTEC   clopidogrel 75 MG tablet Commonly known as:  PLAVIX Take by mouth.   cyclobenzaprine 10 MG tablet Commonly known as:  FLEXERIL   dorzolamidel-timolol 22.3-6.8 MG/ML Soln ophthalmic solution Commonly known as:  COSOPT Apply to eye.     dorzolamide-timolol 22.3-6.8 MG/ML ophthalmic solution Commonly known as:  COSOPT   fluticasone 50 MCG/ACT nasal spray Commonly known as:  FLONASE SHAKE LQ AND U 2 SPRAYS IEN QD PRF RHINITIS OR ALLERGIES   fluticasone 50 MCG/ACT nasal spray Commonly known as:  FLONASE   HYDROMET 5-1.5 MG/5ML syrup Generic drug:  HYDROcodone-homatropine TK 5 ML PO Q 6 H PRN FOR COUGH   hydrOXYzine 25 MG tablet Commonly known as:  ATARAX/VISTARIL Take by mouth.   latanoprost 0.005 % ophthalmic solution Commonly known as:  XALATAN INSTILL 1 DROP OU QHS   losartan 50 MG tablet Commonly known as:  COZAAR Take 50 mg by mouth.   metoprolol succinate 100 MG 24 hr tablet Commonly known as:  TOPROL-XL   omeprazole 20 MG capsule Commonly known as:  PRILOSEC TK ONE C PO BID   rosuvastatin 10 MG tablet Commonly known as:  CRESTOR Take 10 mg by mouth.   sildenafil 20 MG tablet Commonly known as:  REVATIO 2-5 tablets 1 hr prior to intercourse as needed   tamsulosin 0.4 MG Caps capsule Commonly known as:  FLOMAX Take 0.4 mg by mouth.   zolpidem 10 MG tablet Commonly known as:  AMBIEN       Allergies: No Known Allergies  Family History: Family History  Problem Relation Age of Onset  . Prostate cancer Father  Social History:  reports that he has never smoked. He has never used smokeless tobacco. He reports that he does not drink alcohol or use drugs.  ROS: UROLOGY Frequent Urination?: Yes Hard to postpone urination?: Yes Burning/pain with urination?: No Get up at night to urinate?: Yes Leakage of urine?: No Urine stream starts and stops?: No Trouble starting stream?: No Do you have to strain to urinate?: No Blood in urine?: No Urinary tract infection?: Yes Sexually transmitted disease?: No Injury to kidneys or bladder?: No Painful intercourse?: No Weak stream?: No Erection problems?: Yes Penile pain?: No  Gastrointestinal Nausea?: No Vomiting?:  No Indigestion/heartburn?: No Diarrhea?: No Constipation?: No  Constitutional Fever: No Night sweats?: No Weight loss?: No Fatigue?: No  Skin Skin rash/lesions?: No Itching?: No  Eyes Blurred vision?: No Double vision?: No  Ears/Nose/Throat Sore throat?: No Sinus problems?: No  Hematologic/Lymphatic Swollen glands?: No Easy bruising?: Yes  Cardiovascular Leg swelling?: No Chest pain?: No  Respiratory Cough?: No Shortness of breath?: No  Endocrine Excessive thirst?: No  Musculoskeletal Back pain?: No Joint pain?: No  Neurological Headaches?: No Dizziness?: No  Psychologic Depression?: No Anxiety?: No  Physical Exam: BP 122/63 (BP Location: Left Arm, Patient Position: Sitting, Cuff Size: Large)   Pulse (!) 58   Ht 5\' 8"  (1.727 m)   Wt 226 lb (102.5 kg)   BMI 34.36 kg/m   Constitutional:  Alert and oriented, No acute distress. HEENT: [place] AT, moist mucus membranes.  Trachea midline, no masses. Cardiovascular: No clubbing, cyanosis, or edema. Respiratory: Normal respiratory effort, no increased work of breathing. GI: Abdomen is soft, nontender, nondistended, no abdominal masses GU: No CVA tenderness Lymph: No cervical or inguinal lymphadenopathy. Skin: No rashes, bruises or suspicious lesions. Neurologic: Grossly intact, no focal deficits, moving all 4 extremities. Psychiatric: Normal mood and affect.   Urinalysis Dipstick 2+ blood Microscopy 3-10 RBC   Assessment & Plan:   82 year old male with recurrent nephrolithiasis.  Recent passage of 4 calculi.  Recommend scheduling KUB/renal ultrasound.  He will be contacted with results and further follow-up recommendations.   Abbie Sons, [place] [address] [place], [place] [address] [phone]

## 2017-11-27 ENCOUNTER — Telehealth: Payer: Self-pay | Admitting: Urology

## 2017-11-27 DIAGNOSIS — N200 Calculus of kidney: Secondary | ICD-10-CM

## 2017-11-27 DIAGNOSIS — Z87442 Personal history of urinary calculi: Secondary | ICD-10-CM

## 2017-11-27 NOTE — Telephone Encounter (Signed)
Can you please put order in for KUB and RUS per last office visit?

## 2017-11-28 NOTE — Telephone Encounter (Signed)
Orders were entered.

## 2017-12-17 ENCOUNTER — Ambulatory Visit
Admission: RE | Admit: 2017-12-17 | Discharge: 2017-12-17 | Disposition: A | Discharge status: Home / Self Care | Payer: Medicare Other | Source: Ambulatory Visit | Attending: Urology | Admitting: Urology

## 2017-12-17 DIAGNOSIS — N200 Calculus of kidney: Secondary | ICD-10-CM

## 2017-12-17 DIAGNOSIS — N281 Cyst of kidney, acquired: Secondary | ICD-10-CM | POA: Diagnosis not present

## 2017-12-19 ENCOUNTER — Telehealth: Payer: Self-pay | Admitting: Family Medicine

## 2017-12-19 NOTE — Telephone Encounter (Signed)
-----   Message from Abbie Sons, MD sent at 12/19/2017  7:47 AM EST ----- Renal ultrasound showed bilateral cysts and a 5 mm right lower pole renal calculus.  There is no evidence of kidney blockage.  The KUB did show smaller calculi in both kidneys.  Recommend follow-up 6 months with a KUB and to return earlier for kidney stone pain.

## 2017-12-19 NOTE — Telephone Encounter (Signed)
Unable to leave message, no voicemail set up.

## 2017-12-24 NOTE — Telephone Encounter (Signed)
Called for pt, someone answered stating pt was unavailable. Unable to discuss further due to no DPR being on file.

## 2017-12-25 NOTE — Telephone Encounter (Signed)
Attempted to contact patient regarding results.  Will send letter to patient regarding results since we are unable to reach patient by phone.

## 2017-12-31 ENCOUNTER — Telehealth: Payer: Self-pay | Admitting: Urology

## 2017-12-31 NOTE — Telephone Encounter (Signed)
Patient called back and results were given over the phone and follow up app made   [facility]

## 2018-02-20 ENCOUNTER — Emergency Department: Payer: Medicare Other

## 2018-02-20 ENCOUNTER — Other Ambulatory Visit: Payer: Self-pay

## 2018-02-20 ENCOUNTER — Emergency Department
Admission: EM | Admit: 2018-02-20 | Discharge: 2018-02-20 | Disposition: A | Discharge status: Other Acute Inpatient Hospital | Payer: Medicare Other | Attending: Emergency Medicine | Admitting: Emergency Medicine

## 2018-02-20 ENCOUNTER — Encounter: Payer: Self-pay | Admitting: Physician Assistant

## 2018-02-20 DIAGNOSIS — I10 Essential (primary) hypertension: Secondary | ICD-10-CM | POA: Diagnosis not present

## 2018-02-20 DIAGNOSIS — Z79899 Other long term (current) drug therapy: Secondary | ICD-10-CM | POA: Diagnosis not present

## 2018-02-20 DIAGNOSIS — S0990XA Unspecified injury of head, initial encounter: Secondary | ICD-10-CM | POA: Diagnosis present

## 2018-02-20 DIAGNOSIS — J45909 Unspecified asthma, uncomplicated: Secondary | ICD-10-CM | POA: Insufficient documentation

## 2018-02-20 DIAGNOSIS — S12600A Unspecified displaced fracture of seventh cervical vertebra, initial encounter for closed fracture: Secondary | ICD-10-CM | POA: Diagnosis not present

## 2018-02-20 DIAGNOSIS — Y999 Unspecified external cause status: Secondary | ICD-10-CM | POA: Diagnosis not present

## 2018-02-20 DIAGNOSIS — Y92410 Unspecified street and highway as the place of occurrence of the external cause: Secondary | ICD-10-CM | POA: Diagnosis not present

## 2018-02-20 DIAGNOSIS — Y9389 Activity, other specified: Secondary | ICD-10-CM | POA: Diagnosis not present

## 2018-02-20 DIAGNOSIS — Z7902 Long term (current) use of antithrombotics/antiplatelets: Secondary | ICD-10-CM | POA: Diagnosis not present

## 2018-02-20 MED ORDER — HYDROCODONE-ACETAMINOPHEN 5-325 MG PO TABS
1.0000 | ORAL_TABLET | Freq: Once | ORAL | Status: AC
  Administered 2018-02-20: 1 via ORAL
  Filled 2018-02-20: qty 1

## 2018-02-20 MED ORDER — ONDANSETRON HCL 4 MG/2ML IJ SOLN
4.0000 mg | Freq: Once | INTRAMUSCULAR | Status: AC
  Administered 2018-02-20: 4 mg via INTRAVENOUS
  Filled 2018-02-20: qty 2

## 2018-02-20 MED ORDER — OXYCODONE HCL 5 MG PO TABS
5.0000 mg | ORAL_TABLET | Freq: Once | ORAL | Status: AC
  Administered 2018-02-20: 5 mg via ORAL
  Filled 2018-02-20: qty 1

## 2018-02-20 MED ORDER — MORPHINE SULFATE (PF) 4 MG/ML IV SOLN
4.0000 mg | Freq: Once | INTRAVENOUS | Status: AC
  Administered 2018-02-20: 4 mg via INTRAVENOUS
  Filled 2018-02-20: qty 1

## 2018-02-20 NOTE — ED Notes (Signed)
Called [facility] for transport to [facility] ED 2152

## 2018-02-20 NOTE — ED Notes (Signed)
Report [phone].

## 2018-02-20 NOTE — ED Notes (Signed)
[facility]  2002
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
## 2018-02-20 NOTE — ED Provider Notes (Signed)
-----------------------------------------   10:34 PM on 02/20/2018 -----------------------------------------   Blood pressure 139/68, pulse (!) 53, temperature 98 F (36.7 C), resp. rate 16, height 5\' 8"  (1.727 m), weight 104.3 kg, SpO2 100 %.  Assuming care from [facility] PA-C  In short, Chris Kent is a 83 y.o. male with a chief complaint of Marine scientist .  Refer to the original H&P for additional details.   The current plan of care is to discuss possible transfer with [facility] neurosurgery team.  Patient presented to the emergency department complaining of neck pain, left shoulder pain, radicular pains in the left upper extremity and numbness in the right hand.  As documented above, patient does have a C7 fracture.  Current plan is to discuss patient history, physical exam, result findings with neurosurgery team at [facility].  I discussed the patient with neurosurgery at [facility] and they are agreeable to transfer for further evaluation.  Patient is in a hard cervical collar at this time.  He is resting comfortably with no complaints.  Patient care is transferred to [facility] for transport to [facility] emergency department for evaluation of his cervical fracture        Brynda Peon 02/20/18 2347    Nena Polio, MD 02/21/18 [phone]

## 2018-02-20 NOTE — ED Notes (Signed)
Called [facility] spoke to [place] for transfer called and spoke to [place]  for images to be poweshared
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
## 2018-02-20 NOTE — ED Notes (Signed)
Called neurosurgery at [facility] for consult  2027

## 2018-02-20 NOTE — ED Notes (Signed)
Accepted by [facility] ED  2142

## 2018-02-20 NOTE — ED Notes (Signed)
EMTALA reviewed by this RN.  

## 2018-03-06 ENCOUNTER — Ambulatory Visit (INDEPENDENT_AMBULATORY_CARE_PROVIDER_SITE_OTHER): Payer: Medicare Other | Admitting: Vascular Surgery

## 2018-03-06 ENCOUNTER — Encounter (INDEPENDENT_AMBULATORY_CARE_PROVIDER_SITE_OTHER): Payer: Medicare Other

## 2018-06-16 ENCOUNTER — Telehealth: Payer: Self-pay | Admitting: Urology

## 2018-06-16 DIAGNOSIS — N200 Calculus of kidney: Secondary | ICD-10-CM

## 2018-06-16 NOTE — Telephone Encounter (Signed)
Patient has a telephone app on 06-25-18 and needs an order for his KUB he said he is going tomorrow   Sharyn Lull

## 2018-06-17 ENCOUNTER — Ambulatory Visit
Admission: RE | Admit: 2018-06-17 | Discharge: 2018-06-17 | Disposition: A | Discharge status: Home / Self Care | Payer: Medicare Other | Source: Ambulatory Visit | Attending: Urology | Admitting: Urology

## 2018-06-17 ENCOUNTER — Other Ambulatory Visit: Payer: Self-pay

## 2018-06-17 DIAGNOSIS — N200 Calculus of kidney: Secondary | ICD-10-CM | POA: Insufficient documentation

## 2018-06-25 ENCOUNTER — Telehealth (INDEPENDENT_AMBULATORY_CARE_PROVIDER_SITE_OTHER): Payer: Medicare Other | Admitting: Urology

## 2018-06-25 ENCOUNTER — Other Ambulatory Visit: Payer: Self-pay

## 2018-06-25 DIAGNOSIS — N401 Enlarged prostate with lower urinary tract symptoms: Secondary | ICD-10-CM | POA: Diagnosis not present

## 2018-06-25 DIAGNOSIS — N200 Calculus of kidney: Secondary | ICD-10-CM | POA: Insufficient documentation
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
## 2018-06-26 ENCOUNTER — Ambulatory Visit: Payer: Medicare Other | Admitting: Urology

## 2019-01-26 ENCOUNTER — Other Ambulatory Visit (INDEPENDENT_AMBULATORY_CARE_PROVIDER_SITE_OTHER): Payer: Self-pay | Admitting: Vascular Surgery

## 2019-01-26 DIAGNOSIS — I6523 Occlusion and stenosis of bilateral carotid arteries: Secondary | ICD-10-CM

## 2019-02-09 ENCOUNTER — Encounter (INDEPENDENT_AMBULATORY_CARE_PROVIDER_SITE_OTHER): Payer: Self-pay | Admitting: Vascular Surgery

## 2019-02-09 ENCOUNTER — Ambulatory Visit (INDEPENDENT_AMBULATORY_CARE_PROVIDER_SITE_OTHER): Payer: Medicare Other | Admitting: Vascular Surgery

## 2019-02-09 ENCOUNTER — Other Ambulatory Visit: Payer: Self-pay

## 2019-02-09 ENCOUNTER — Ambulatory Visit (INDEPENDENT_AMBULATORY_CARE_PROVIDER_SITE_OTHER): Payer: Medicare Other

## 2019-02-09 VITALS — BP 144/67 | HR 55 | Resp 16 | Wt 230.0 lb

## 2019-02-09 DIAGNOSIS — E785 Hyperlipidemia, unspecified: Secondary | ICD-10-CM | POA: Diagnosis not present

## 2019-02-09 DIAGNOSIS — I10 Essential (primary) hypertension: Secondary | ICD-10-CM | POA: Diagnosis not present

## 2019-02-09 DIAGNOSIS — I6523 Occlusion and stenosis of bilateral carotid arteries: Secondary | ICD-10-CM

## 2019-02-09 NOTE — Assessment & Plan Note (Signed)
Bilateral carotid stenosis remains mild in the 1 to 39% range.  No significant progression previous study.  Continue current medical regimen.  Recheck in 1 year.
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
## 2019-06-28 ENCOUNTER — Other Ambulatory Visit: Payer: Self-pay

## 2019-06-28 ENCOUNTER — Ambulatory Visit
Admission: RE | Admit: 2019-06-28 | Discharge: 2019-06-28 | Disposition: A | Discharge status: Home / Self Care | Payer: Medicare Other | Source: Ambulatory Visit | Attending: Urology | Admitting: Urology

## 2019-06-28 ENCOUNTER — Ambulatory Visit: Payer: Medicare Other | Admitting: Urology

## 2019-06-28 DIAGNOSIS — N200 Calculus of kidney: Secondary | ICD-10-CM

## 2019-06-29 ENCOUNTER — Encounter: Payer: Self-pay | Admitting: Urology

## 2019-06-29 ENCOUNTER — Ambulatory Visit (INDEPENDENT_AMBULATORY_CARE_PROVIDER_SITE_OTHER): Payer: Medicare Other | Admitting: Urology

## 2019-06-29 VITALS — BP 136/69 | HR 56 | Ht 69.0 in | Wt 220.0 lb

## 2019-06-29 DIAGNOSIS — N200 Calculus of kidney: Secondary | ICD-10-CM

## 2019-06-29 DIAGNOSIS — N5201 Erectile dysfunction due to arterial insufficiency: Secondary | ICD-10-CM | POA: Insufficient documentation

## 2019-06-29 MED ORDER — SILDENAFIL CITRATE 20 MG PO TABS: ORAL_TABLET | ORAL | 0 refills | Status: DC

## 2019-06-29 NOTE — Progress Notes (Signed)
06/29/2019 9:17 AM   Chris Kent 08-01-1935 [id number]  Referring provider: Idelle Crouch, MD [facility] [place],  [place] [address]  Chief Complaint  Patient presents with  . Follow-up    Urologic history: 1.  Nephrolithiasis -Bilateral, nonobstructing renal calculi -Metabolic evaluation low urine volume, hypocitraturia   HPI: 84 y.o. male presents for annual follow-up  -States he passed to calculi in November approximately 1 week apart -No pain or symptoms at the time of stone passage -Estimated both calculi were approximately 1/4" in diameter - no bothersome LUTS -Denies dysuria, gross hematuria -Requests refill sildenafil  PMH: Past Medical History:  Diagnosis Date  . Anxiety   . Arthritis   . Asthma   . GERD (gastroesophageal reflux disease)   . Glaucoma   . Glaucoma   . History of stroke   . Hyperlipidemia   . Hyperlipidemia   . Hypertension   . Peripheral vascular disease ([place])   . Sleep apnea     Surgical History: Past Surgical History:  Procedure Laterality Date  . REPLACEMENT TOTAL KNEE BILATERAL      Home Medications:  Allergies as of 06/29/2019   No Known Allergies     Medication List       Accurate as of Jun 29, 2019  9:17 AM. If you have any questions, ask your nurse or doctor.        albuterol 108 (90 Base) MCG/ACT inhaler Commonly known as: VENTOLIN HFA Inhale into the lungs.   amLODipine 5 MG tablet Commonly known as: NORVASC Take 5 mg by mouth.   Breo Ellipta 200-25 MCG/INH Aepb Generic drug: fluticasone furoate-vilanterol INL 1 PUFF INTO THE LUNGS QD   cefdinir 300 MG capsule Commonly known as: OMNICEF   cetirizine 10 MG tablet Commonly known as: ZYRTEC   clopidogrel 75 MG tablet Commonly known as: PLAVIX Take by mouth.   cyclobenzaprine 10 MG tablet Commonly known as: FLEXERIL   dorzolamidel-timolol 22.3-6.8 MG/ML Soln ophthalmic solution Commonly known as: COSOPT Apply  to eye.   dorzolamide-timolol 22.3-6.8 MG/ML ophthalmic solution Commonly known as: COSOPT   doxycycline 100 MG tablet Commonly known as: VIBRA-TABS Take 100 mg by mouth 2 (two) times daily.   fluticasone 50 MCG/ACT nasal spray Commonly known as: FLONASE SHAKE LQ AND U 2 SPRAYS IEN QD PRF RHINITIS OR ALLERGIES   fluticasone 50 MCG/ACT nasal spray Commonly known as: FLONASE   Hydromet 5-1.5 MG/5ML syrup Generic drug: HYDROcodone-homatropine TK 5 ML PO Q 6 H PRN FOR COUGH   hydrOXYzine 25 MG tablet Commonly known as: ATARAX/VISTARIL Take by mouth.   latanoprost 0.005 % ophthalmic solution Commonly known as: XALATAN INSTILL 1 DROP OU QHS   losartan 50 MG tablet Commonly known as: COZAAR Take 50 mg by mouth.   metoprolol succinate 100 MG 24 hr tablet Commonly known as: TOPROL-XL   mupirocin ointment 2 % Commonly known as: BACTROBAN APPLY TOPICALLY TO THE AFFECTED AREA THREE TIMES DAILY FOR 7 DAYS   omeprazole 20 MG capsule Commonly known as: PRILOSEC TK ONE C PO BID   rosuvastatin 10 MG tablet Commonly known as: CRESTOR Take 10 mg by mouth.   sildenafil 20 MG tablet Commonly known as: REVATIO 2-5 tablets 1 hr prior to intercourse as needed   tamsulosin 0.4 MG Caps capsule Commonly known as: FLOMAX Take 0.4 mg by mouth.   triamcinolone cream 0.1 % Commonly known as: KENALOG APPLY EXTERNALLY TO THE AFFECTED AREA TWICE DAILY  zolpidem 10 MG tablet Commonly known as: AMBIEN       Allergies: No Known Allergies  Family History: Family History  Problem Relation Age of Onset  . Prostate cancer Father     Social History:  reports that he has never smoked. He has never used smokeless tobacco. He reports that he does not drink alcohol or use drugs.   Physical Exam: BP 136/69   Pulse (!) 56   Ht 5\' 9"  (1.753 m)   Wt 220 lb (99.8 kg)   BMI 32.49 kg/m   Constitutional:  Alert and oriented, No acute distress. HEENT: [place] AT, moist mucus membranes.   Trachea midline, no masses. Cardiovascular: No clubbing, cyanosis, or edema. Respiratory: Normal respiratory effort, no increased work of breathing. Skin: No rashes, bruises or suspicious lesions. Neurologic: Grossly intact, no focal deficits, moving all 4 extremities. Psychiatric: Normal mood and affect.   Assessment & Plan:    1.  Nephrolithiasis -KUB today reviewed and no calcifications were seen.  Prior KUBs did show bilateral renal calculi measuring 3-4 mm and these may have been the calculi he passed in November.  Continue annual follow-up with KUB  2.  Erectile dysfunction -Sildenafil refilled.  Denies use of oral or sublingual nitrates   Abbie Sons, [place] [address], [place], [place] [address] [phone]

## 2019-06-30 ENCOUNTER — Ambulatory Visit: Payer: Self-pay | Admitting: Urology

## 2020-02-11 ENCOUNTER — Encounter (INDEPENDENT_AMBULATORY_CARE_PROVIDER_SITE_OTHER): Payer: Medicare Other

## 2020-02-11 ENCOUNTER — Ambulatory Visit (INDEPENDENT_AMBULATORY_CARE_PROVIDER_SITE_OTHER): Payer: Medicare Other | Admitting: Nurse Practitioner

## 2020-02-23 DIAGNOSIS — D696 Thrombocytopenia, unspecified: Secondary | ICD-10-CM | POA: Diagnosis not present

## 2020-02-23 DIAGNOSIS — R0989 Other specified symptoms and signs involving the circulatory and respiratory systems: Secondary | ICD-10-CM | POA: Diagnosis not present

## 2020-02-23 DIAGNOSIS — I10 Essential (primary) hypertension: Secondary | ICD-10-CM | POA: Diagnosis not present

## 2020-02-23 DIAGNOSIS — J90 Pleural effusion, not elsewhere classified: Secondary | ICD-10-CM | POA: Diagnosis not present

## 2020-02-23 DIAGNOSIS — R739 Hyperglycemia, unspecified: Secondary | ICD-10-CM | POA: Diagnosis not present

## 2020-02-23 DIAGNOSIS — I517 Cardiomegaly: Secondary | ICD-10-CM | POA: Diagnosis not present

## 2020-02-23 DIAGNOSIS — R0689 Other abnormalities of breathing: Secondary | ICD-10-CM | POA: Diagnosis not present

## 2020-02-23 DIAGNOSIS — E7800 Pure hypercholesterolemia, unspecified: Secondary | ICD-10-CM | POA: Diagnosis not present

## 2020-02-23 DIAGNOSIS — Z79899 Other long term (current) drug therapy: Secondary | ICD-10-CM | POA: Diagnosis not present

## 2020-02-25 ENCOUNTER — Ambulatory Visit (INDEPENDENT_AMBULATORY_CARE_PROVIDER_SITE_OTHER): Payer: PPO

## 2020-02-25 ENCOUNTER — Other Ambulatory Visit: Payer: Self-pay

## 2020-02-25 DIAGNOSIS — I6523 Occlusion and stenosis of bilateral carotid arteries: Secondary | ICD-10-CM

## 2020-03-06 ENCOUNTER — Encounter (INDEPENDENT_AMBULATORY_CARE_PROVIDER_SITE_OTHER): Payer: Self-pay | Admitting: *Deleted

## 2020-03-09 DIAGNOSIS — I517 Cardiomegaly: Secondary | ICD-10-CM | POA: Diagnosis not present

## 2020-03-09 DIAGNOSIS — R9389 Abnormal findings on diagnostic imaging of other specified body structures: Secondary | ICD-10-CM | POA: Diagnosis not present

## 2020-03-09 DIAGNOSIS — R4182 Altered mental status, unspecified: Secondary | ICD-10-CM | POA: Diagnosis not present

## 2020-03-09 DIAGNOSIS — J90 Pleural effusion, not elsewhere classified: Secondary | ICD-10-CM | POA: Diagnosis not present

## 2020-03-10 DIAGNOSIS — I872 Venous insufficiency (chronic) (peripheral): Secondary | ICD-10-CM | POA: Diagnosis not present

## 2020-03-10 DIAGNOSIS — R600 Localized edema: Secondary | ICD-10-CM | POA: Diagnosis not present

## 2020-03-10 DIAGNOSIS — I10 Essential (primary) hypertension: Secondary | ICD-10-CM | POA: Diagnosis not present

## 2020-03-10 DIAGNOSIS — D696 Thrombocytopenia, unspecified: Secondary | ICD-10-CM | POA: Diagnosis not present

## 2020-03-15 DIAGNOSIS — J449 Chronic obstructive pulmonary disease, unspecified: Secondary | ICD-10-CM | POA: Diagnosis not present

## 2020-03-24 DIAGNOSIS — J984 Other disorders of lung: Secondary | ICD-10-CM | POA: Diagnosis not present

## 2020-03-24 DIAGNOSIS — Z981 Arthrodesis status: Secondary | ICD-10-CM | POA: Diagnosis not present

## 2020-03-24 DIAGNOSIS — R9389 Abnormal findings on diagnostic imaging of other specified body structures: Secondary | ICD-10-CM | POA: Diagnosis not present

## 2020-03-24 DIAGNOSIS — R918 Other nonspecific abnormal finding of lung field: Secondary | ICD-10-CM | POA: Diagnosis not present

## 2020-04-12 DIAGNOSIS — J449 Chronic obstructive pulmonary disease, unspecified: Secondary | ICD-10-CM | POA: Diagnosis not present

## 2020-05-13 DIAGNOSIS — J449 Chronic obstructive pulmonary disease, unspecified: Secondary | ICD-10-CM | POA: Diagnosis not present

## 2020-05-25 DIAGNOSIS — L820 Inflamed seborrheic keratosis: Secondary | ICD-10-CM | POA: Diagnosis not present

## 2020-05-25 DIAGNOSIS — D225 Melanocytic nevi of trunk: Secondary | ICD-10-CM | POA: Diagnosis not present

## 2020-05-25 DIAGNOSIS — L570 Actinic keratosis: Secondary | ICD-10-CM | POA: Diagnosis not present

## 2020-05-25 DIAGNOSIS — L538 Other specified erythematous conditions: Secondary | ICD-10-CM | POA: Diagnosis not present

## 2020-05-25 DIAGNOSIS — D2262 Melanocytic nevi of left upper limb, including shoulder: Secondary | ICD-10-CM | POA: Diagnosis not present

## 2020-05-25 DIAGNOSIS — D2261 Melanocytic nevi of right upper limb, including shoulder: Secondary | ICD-10-CM | POA: Diagnosis not present

## 2020-05-25 DIAGNOSIS — Z85828 Personal history of other malignant neoplasm of skin: Secondary | ICD-10-CM | POA: Diagnosis not present

## 2020-05-25 DIAGNOSIS — D2271 Melanocytic nevi of right lower limb, including hip: Secondary | ICD-10-CM | POA: Diagnosis not present

## 2020-05-25 DIAGNOSIS — X32XXXA Exposure to sunlight, initial encounter: Secondary | ICD-10-CM | POA: Diagnosis not present

## 2020-06-12 DIAGNOSIS — J449 Chronic obstructive pulmonary disease, unspecified: Secondary | ICD-10-CM | POA: Diagnosis not present

## 2020-06-14 DIAGNOSIS — Z125 Encounter for screening for malignant neoplasm of prostate: Secondary | ICD-10-CM | POA: Diagnosis not present

## 2020-06-14 DIAGNOSIS — R739 Hyperglycemia, unspecified: Secondary | ICD-10-CM | POA: Diagnosis not present

## 2020-06-14 DIAGNOSIS — Z79899 Other long term (current) drug therapy: Secondary | ICD-10-CM | POA: Diagnosis not present

## 2020-06-14 DIAGNOSIS — E7800 Pure hypercholesterolemia, unspecified: Secondary | ICD-10-CM | POA: Diagnosis not present

## 2020-06-14 DIAGNOSIS — I10 Essential (primary) hypertension: Secondary | ICD-10-CM | POA: Diagnosis not present

## 2020-06-14 DIAGNOSIS — D696 Thrombocytopenia, unspecified: Secondary | ICD-10-CM | POA: Diagnosis not present

## 2020-06-14 DIAGNOSIS — Z0000 Encounter for general adult medical examination without abnormal findings: Secondary | ICD-10-CM | POA: Diagnosis not present

## 2020-06-26 ENCOUNTER — Ambulatory Visit: Payer: Self-pay | Admitting: Urology

## 2020-06-29 ENCOUNTER — Other Ambulatory Visit: Payer: Self-pay

## 2020-06-29 ENCOUNTER — Ambulatory Visit: Payer: PPO | Admitting: Urology

## 2020-06-29 ENCOUNTER — Ambulatory Visit
Admission: RE | Admit: 2020-06-29 | Discharge: 2020-06-29 | Disposition: A | Discharge status: Home / Self Care | Payer: PPO | Attending: Urology | Admitting: Urology

## 2020-06-29 ENCOUNTER — Ambulatory Visit
Admission: RE | Admit: 2020-06-29 | Discharge: 2020-06-29 | Disposition: A | Discharge status: Home / Self Care | Payer: PPO | Source: Ambulatory Visit | Attending: Urology | Admitting: Urology

## 2020-06-29 ENCOUNTER — Encounter: Payer: Self-pay | Admitting: Urology

## 2020-06-29 VITALS — BP 152/76 | HR 61 | Ht 68.0 in | Wt 225.0 lb

## 2020-06-29 DIAGNOSIS — N401 Enlarged prostate with lower urinary tract symptoms: Secondary | ICD-10-CM | POA: Diagnosis not present

## 2020-06-29 DIAGNOSIS — N200 Calculus of kidney: Secondary | ICD-10-CM

## 2020-06-29 DIAGNOSIS — Z87442 Personal history of urinary calculi: Secondary | ICD-10-CM | POA: Diagnosis not present

## 2020-06-29 DIAGNOSIS — M4328 Fusion of spine, sacral and sacrococcygeal region: Secondary | ICD-10-CM | POA: Diagnosis not present

## 2020-06-29 NOTE — Progress Notes (Signed)
06/29/2020 11:13 AM   Chris Kent Feb 26, 1935 [id number]  Referring provider: Idelle Crouch, MD [facility] [place],  [place] [address]  Chief Complaint  Patient presents with  . Nephrolithiasis     Urologic history: 1.  Nephrolithiasis -Bilateral, nonobstructing renal calculi -Metabolic evaluation low urine volume, hypocitraturia  2.  BPH with LUTS -Tamsulosin  HPI: 85 y.o. male presents for annual follow-up.   He states he has done well since last years visit and denies flank, abdominal, pelvic pain or stone passage  Since last years visit he has noted some worsening voiding symptoms with some increased daytime frequency urgency and nocturia x4; denies urge incontinence  Remains on tamsulosin  Denies dysuria, gross hematuria  Denies flank, abdominal or pelvic pain   PMH: Past Medical History:  Diagnosis Date  . Anxiety   . Arthritis   . Asthma   . GERD (gastroesophageal reflux disease)   . Glaucoma   . Glaucoma   . History of stroke   . Hyperlipidemia   . Hyperlipidemia   . Hypertension   . Peripheral vascular disease ([place])   . Sleep apnea     Surgical History: Past Surgical History:  Procedure Laterality Date  . REPLACEMENT TOTAL KNEE BILATERAL      Home Medications:  Allergies as of 06/29/2020   No Known Allergies     Medication List       Accurate as of Jun 29, 2020 11:13 AM. If you have any questions, ask your nurse or doctor.        albuterol 108 (90 Base) MCG/ACT inhaler Commonly known as: VENTOLIN HFA Inhale into the lungs.   amLODipine 5 MG tablet Commonly known as: NORVASC Take 5 mg by mouth.   Breo Ellipta 200-25 MCG/INH Aepb Generic drug: fluticasone furoate-vilanterol INL 1 PUFF INTO THE LUNGS QD   cefdinir 300 MG capsule Commonly known as: OMNICEF   cetirizine 10 MG tablet Commonly known as: ZYRTEC   clopidogrel 75 MG tablet Commonly known as: PLAVIX Take by mouth.    cyclobenzaprine 10 MG tablet Commonly known as: FLEXERIL   dorzolamidel-timolol 22.3-6.8 MG/ML Soln ophthalmic solution Commonly known as: COSOPT Apply to eye.   dorzolamide-timolol 22.3-6.8 MG/ML ophthalmic solution Commonly known as: COSOPT   doxycycline 100 MG tablet Commonly known as: VIBRA-TABS Take 100 mg by mouth 2 (two) times daily.   fluticasone 50 MCG/ACT nasal spray Commonly known as: FLONASE SHAKE LQ AND U 2 SPRAYS IEN QD PRF RHINITIS OR ALLERGIES   fluticasone 50 MCG/ACT nasal spray Commonly known as: FLONASE   furosemide 20 MG tablet Commonly known as: LASIX Take 20 mg by mouth daily.   Hydromet 5-1.5 MG/5ML syrup Generic drug: HYDROcodone-homatropine TK 5 ML PO Q 6 H PRN FOR COUGH   hydrOXYzine 25 MG tablet Commonly known as: ATARAX/VISTARIL Take by mouth.   latanoprost 0.005 % ophthalmic solution Commonly known as: XALATAN INSTILL 1 DROP OU QHS   losartan 50 MG tablet Commonly known as: COZAAR Take 50 mg by mouth.   metoprolol succinate 100 MG 24 hr tablet Commonly known as: TOPROL-XL   mupirocin ointment 2 % Commonly known as: BACTROBAN APPLY TOPICALLY TO THE AFFECTED AREA THREE TIMES DAILY FOR 7 DAYS   omeprazole 20 MG capsule Commonly known as: PRILOSEC TK ONE C PO BID   rosuvastatin 10 MG tablet Commonly known as: CRESTOR Take 10 mg by mouth.   sildenafil 20 MG tablet Commonly known as: REVATIO 2-5 tablets  1 hr prior to intercourse as needed   tamsulosin 0.4 MG Caps capsule Commonly known as: FLOMAX Take 0.4 mg by mouth.   triamcinolone cream 0.1 % Commonly known as: KENALOG APPLY EXTERNALLY TO THE AFFECTED AREA TWICE DAILY   zolpidem 10 MG tablet Commonly known as: AMBIEN       Allergies: No Known Allergies  Family History: Family History  Problem Relation Age of Onset  . Prostate cancer Father     Social History:  reports that he has never smoked. He has never used smokeless tobacco. He reports that he does not  drink alcohol and does not use drugs.   Physical Exam: BP (!) 152/76   Pulse 61   Ht 5\' 8"  (1.727 m)   Wt 225 lb (102.1 kg)   BMI 34.21 kg/m   Constitutional:  Alert and oriented, No acute distress. HEENT: [place] AT, moist mucus membranes.  Trachea midline, no masses. Cardiovascular: No clubbing, cyanosis, or edema. Respiratory: Normal respiratory effort, no increased work of breathing. Skin: No rashes, bruises or suspicious lesions. Neurologic: Grossly intact, no focal deficits, moving all 4 extremities. Psychiatric: Normal mood and affect.    Assessment & Plan:    1.  Nephrolithiasis  Asymptomatic  KUB was ordered and he will be notified with results  Continue annual follow-up and prn visit for flank pain  2.  BPH with LUTS  Worsening nocturia, frequency and urgency  States his voiding symptoms are not bothersome enough that he would desire to add a another medication and he will continue tamsulosin  Instructed to call for worsening symptoms   Abbie Sons, MD  [place] [address] [place], [place] [address] [phone]

## 2020-06-30 ENCOUNTER — Encounter: Payer: Self-pay | Admitting: Urology

## 2020-07-04 ENCOUNTER — Telehealth: Payer: Self-pay | Admitting: *Deleted

## 2020-07-04 NOTE — Telephone Encounter (Signed)
Notified patient as instructed, patient pleased °

## 2020-07-04 NOTE — Telephone Encounter (Signed)
-----   Message from Abbie Sons, MD sent at 07/03/2020 10:25 AM EDT ----- KUB with stable left renal calculus

## 2020-07-13 DIAGNOSIS — J449 Chronic obstructive pulmonary disease, unspecified: Secondary | ICD-10-CM | POA: Diagnosis not present

## 2020-07-18 DIAGNOSIS — H401131 Primary open-angle glaucoma, bilateral, mild stage: Secondary | ICD-10-CM | POA: Diagnosis not present

## 2020-08-12 DIAGNOSIS — J449 Chronic obstructive pulmonary disease, unspecified: Secondary | ICD-10-CM | POA: Diagnosis not present

## 2020-09-12 DIAGNOSIS — J449 Chronic obstructive pulmonary disease, unspecified: Secondary | ICD-10-CM | POA: Diagnosis not present

## 2020-09-25 DIAGNOSIS — I872 Venous insufficiency (chronic) (peripheral): Secondary | ICD-10-CM | POA: Diagnosis not present

## 2020-10-10 DIAGNOSIS — M7632 Iliotibial band syndrome, left leg: Secondary | ICD-10-CM | POA: Diagnosis not present

## 2020-10-10 DIAGNOSIS — I779 Disorder of arteries and arterioles, unspecified: Secondary | ICD-10-CM | POA: Diagnosis not present

## 2020-10-10 DIAGNOSIS — Z96653 Presence of artificial knee joint, bilateral: Secondary | ICD-10-CM | POA: Diagnosis not present

## 2020-10-13 DIAGNOSIS — J449 Chronic obstructive pulmonary disease, unspecified: Secondary | ICD-10-CM | POA: Diagnosis not present

## 2020-10-16 DIAGNOSIS — Z79899 Other long term (current) drug therapy: Secondary | ICD-10-CM | POA: Diagnosis not present

## 2020-10-16 DIAGNOSIS — D696 Thrombocytopenia, unspecified: Secondary | ICD-10-CM | POA: Diagnosis not present

## 2020-10-16 DIAGNOSIS — Z1211 Encounter for screening for malignant neoplasm of colon: Secondary | ICD-10-CM | POA: Diagnosis not present

## 2020-10-16 DIAGNOSIS — E7800 Pure hypercholesterolemia, unspecified: Secondary | ICD-10-CM | POA: Diagnosis not present

## 2020-10-16 DIAGNOSIS — R739 Hyperglycemia, unspecified: Secondary | ICD-10-CM | POA: Diagnosis not present

## 2020-10-16 DIAGNOSIS — J4520 Mild intermittent asthma, uncomplicated: Secondary | ICD-10-CM | POA: Diagnosis not present

## 2020-10-16 DIAGNOSIS — I10 Essential (primary) hypertension: Secondary | ICD-10-CM | POA: Diagnosis not present

## 2020-10-20 DIAGNOSIS — Z1211 Encounter for screening for malignant neoplasm of colon: Secondary | ICD-10-CM | POA: Diagnosis not present

## 2020-10-26 ENCOUNTER — Emergency Department: Payer: PPO

## 2020-10-26 ENCOUNTER — Other Ambulatory Visit: Payer: Self-pay

## 2020-10-26 ENCOUNTER — Observation Stay
Admission: EM | Admit: 2020-10-26 | Discharge: 2020-11-01 | Disposition: A | Discharge status: Home / Self Care | Payer: PPO | Attending: Internal Medicine | Admitting: Internal Medicine

## 2020-10-26 DIAGNOSIS — J4520 Mild intermittent asthma, uncomplicated: Secondary | ICD-10-CM | POA: Diagnosis not present

## 2020-10-26 DIAGNOSIS — Z79899 Other long term (current) drug therapy: Secondary | ICD-10-CM | POA: Insufficient documentation

## 2020-10-26 DIAGNOSIS — E782 Mixed hyperlipidemia: Secondary | ICD-10-CM

## 2020-10-26 DIAGNOSIS — G4733 Obstructive sleep apnea (adult) (pediatric): Secondary | ICD-10-CM

## 2020-10-26 DIAGNOSIS — I700 Atherosclerosis of aorta: Secondary | ICD-10-CM | POA: Diagnosis not present

## 2020-10-26 DIAGNOSIS — Z96653 Presence of artificial knee joint, bilateral: Secondary | ICD-10-CM | POA: Diagnosis not present

## 2020-10-26 DIAGNOSIS — R1011 Right upper quadrant pain: Secondary | ICD-10-CM | POA: Diagnosis not present

## 2020-10-26 DIAGNOSIS — T1490XA Injury, unspecified, initial encounter: Secondary | ICD-10-CM

## 2020-10-26 DIAGNOSIS — I10 Essential (primary) hypertension: Secondary | ICD-10-CM | POA: Diagnosis not present

## 2020-10-26 DIAGNOSIS — J45909 Unspecified asthma, uncomplicated: Secondary | ICD-10-CM | POA: Insufficient documentation

## 2020-10-26 DIAGNOSIS — I2510 Atherosclerotic heart disease of native coronary artery without angina pectoris: Secondary | ICD-10-CM | POA: Insufficient documentation

## 2020-10-26 DIAGNOSIS — W19XXXA Unspecified fall, initial encounter: Secondary | ICD-10-CM | POA: Diagnosis present

## 2020-10-26 DIAGNOSIS — I313 Pericardial effusion (noninflammatory): Secondary | ICD-10-CM | POA: Diagnosis not present

## 2020-10-26 DIAGNOSIS — Z8673 Personal history of transient ischemic attack (TIA), and cerebral infarction without residual deficits: Secondary | ICD-10-CM | POA: Diagnosis not present

## 2020-10-26 DIAGNOSIS — K4020 Bilateral inguinal hernia, without obstruction or gangrene, not specified as recurrent: Secondary | ICD-10-CM | POA: Diagnosis not present

## 2020-10-26 DIAGNOSIS — Z20822 Contact with and (suspected) exposure to covid-19: Secondary | ICD-10-CM | POA: Insufficient documentation

## 2020-10-26 DIAGNOSIS — G454 Transient global amnesia: Secondary | ICD-10-CM | POA: Diagnosis not present

## 2020-10-26 DIAGNOSIS — Z7902 Long term (current) use of antithrombotics/antiplatelets: Secondary | ICD-10-CM | POA: Insufficient documentation

## 2020-10-26 DIAGNOSIS — R52 Pain, unspecified: Secondary | ICD-10-CM | POA: Diagnosis not present

## 2020-10-26 DIAGNOSIS — S299XXA Unspecified injury of thorax, initial encounter: Secondary | ICD-10-CM | POA: Diagnosis not present

## 2020-10-26 DIAGNOSIS — W1839XA Other fall on same level, initial encounter: Secondary | ICD-10-CM | POA: Insufficient documentation

## 2020-10-26 DIAGNOSIS — Z043 Encounter for examination and observation following other accident: Secondary | ICD-10-CM | POA: Diagnosis not present

## 2020-10-26 DIAGNOSIS — S199XXA Unspecified injury of neck, initial encounter: Secondary | ICD-10-CM | POA: Diagnosis not present

## 2020-10-26 DIAGNOSIS — R109 Unspecified abdominal pain: Secondary | ICD-10-CM | POA: Diagnosis not present

## 2020-10-26 DIAGNOSIS — J929 Pleural plaque without asbestos: Secondary | ICD-10-CM | POA: Diagnosis not present

## 2020-10-26 DIAGNOSIS — F419 Anxiety disorder, unspecified: Secondary | ICD-10-CM | POA: Diagnosis present

## 2020-10-26 DIAGNOSIS — E785 Hyperlipidemia, unspecified: Secondary | ICD-10-CM | POA: Diagnosis present

## 2020-10-26 DIAGNOSIS — G319 Degenerative disease of nervous system, unspecified: Secondary | ICD-10-CM | POA: Diagnosis not present

## 2020-10-26 LAB — CBC WITH DIFFERENTIAL/PLATELET
Abs Immature Granulocytes: 0.05 10*3/uL (ref 0.00–0.07)
Basophils Absolute: 0.1 10*3/uL (ref 0.0–0.1)
Basophils Relative: 1 %
Eosinophils Absolute: 0.1 10*3/uL (ref 0.0–0.5)
Eosinophils Relative: 1 %
HCT: 46.5 % (ref 39.0–52.0)
Hemoglobin: 16.3 g/dL (ref 13.0–17.0)
Immature Granulocytes: 1 %
Lymphocytes Relative: 22 %
Lymphs Abs: 2.2 10*3/uL (ref 0.7–4.0)
MCH: 32.5 pg (ref 26.0–34.0)
MCHC: 35.1 g/dL (ref 30.0–36.0)
MCV: 92.6 fL (ref 80.0–100.0)
Monocytes Absolute: 0.7 10*3/uL (ref 0.1–1.0)
Monocytes Relative: 7 %
Neutro Abs: 7 10*3/uL (ref 1.7–7.7)
Neutrophils Relative %: 68 %
Platelets: 122 10*3/uL — ABNORMAL LOW (ref 150–400)
RBC: 5.02 MIL/uL (ref 4.22–5.81)
RDW: 14.8 % (ref 11.5–15.5)
WBC: 10.2 10*3/uL (ref 4.0–10.5)
nRBC: 0 % (ref 0.0–0.2)

## 2020-10-26 LAB — SURGICAL PCR SCREEN
MRSA, PCR: NEGATIVE
Staphylococcus aureus: NEGATIVE

## 2020-10-26 LAB — RESP PANEL BY RT-PCR (FLU A&B, COVID) ARPGX2
Influenza A by PCR: NEGATIVE
Influenza B by PCR: NEGATIVE
SARS Coronavirus 2 by RT PCR: NEGATIVE

## 2020-10-26 LAB — COMPREHENSIVE METABOLIC PANEL
ALT: 15 U/L (ref 0–44)
AST: 18 U/L (ref 15–41)
Albumin: 3.5 g/dL (ref 3.5–5.0)
Alkaline Phosphatase: 61 U/L (ref 38–126)
Anion gap: 5 (ref 5–15)
BUN: 18 mg/dL (ref 8–23)
CO2: 25 mmol/L (ref 22–32)
Calcium: 8.5 mg/dL — ABNORMAL LOW (ref 8.9–10.3)
Chloride: 110 mmol/L (ref 98–111)
Creatinine, Ser: 1.2 mg/dL (ref 0.61–1.24)
GFR, Estimated: >60 mL/min (ref >60)
Glucose, Bld: 175 mg/dL — ABNORMAL HIGH (ref 70–99)
Potassium: 4 mmol/L (ref 3.5–5.1)
Sodium: 140 mmol/L (ref 135–145)
Total Bilirubin: 1.1 mg/dL (ref 0.3–1.2)
Total Protein: 6.2 g/dL — ABNORMAL LOW (ref 6.5–8.1)

## 2020-10-26 LAB — URINALYSIS, COMPLETE (UACMP) WITH MICROSCOPIC
Bilirubin Urine: NEGATIVE
Glucose, UA: NEGATIVE mg/dL
Ketones, ur: NEGATIVE mg/dL
Leukocytes,Ua: NEGATIVE
Nitrite: NEGATIVE
Protein, ur: 30 mg/dL — AB
Specific Gravity, Urine: 1.035 — ABNORMAL HIGH (ref 1.005–1.030)
Squamous Epithelial / HPF: NONE SEEN (ref 0–5)
pH: 6 (ref 5.0–8.0)

## 2020-10-26 LAB — TSH: TSH: 1.295 u[IU]/mL (ref 0.350–4.500)

## 2020-10-26 LAB — PROCALCITONIN: Procalcitonin: <0.1 ng/mL

## 2020-10-26 LAB — CK: Total CK: 358 U/L (ref 49–397)

## 2020-10-26 MED ORDER — ACETAMINOPHEN 650 MG RE SUPP: 650.0000 mg | Freq: Four times a day (QID) | RECTAL | Status: DC | PRN

## 2020-10-26 MED ORDER — MORPHINE SULFATE (PF) 2 MG/ML IV SOLN
1.0000 mg | INTRAVENOUS | Status: AC | PRN
  Administered 2020-10-26 – 2020-10-27 (×2): 1 mg via INTRAVENOUS
  Filled 2020-10-26 (×2): qty 1

## 2020-10-26 MED ORDER — ACETAMINOPHEN 325 MG PO TABS: 650.0000 mg | ORAL_TABLET | Freq: Four times a day (QID) | ORAL | Status: DC | PRN

## 2020-10-26 MED ORDER — ALBUTEROL SULFATE (2.5 MG/3ML) 0.083% IN NEBU: 3.0000 mL | INHALATION_SOLUTION | Freq: Four times a day (QID) | RESPIRATORY_TRACT | Status: DC | PRN

## 2020-10-26 MED ORDER — LOSARTAN POTASSIUM 50 MG PO TABS
50.0000 mg | ORAL_TABLET | Freq: Two times a day (BID) | ORAL | Status: DC
  Administered 2020-10-26 – 2020-11-01 (×12): 50 mg via ORAL
  Filled 2020-10-26 (×12): qty 1

## 2020-10-26 MED ORDER — OXYCODONE-ACETAMINOPHEN 5-325 MG PO TABS
1.0000 | ORAL_TABLET | Freq: Once | ORAL | Status: AC
  Administered 2020-10-26: 1 via ORAL
  Filled 2020-10-26: qty 1

## 2020-10-26 MED ORDER — TAMSULOSIN HCL 0.4 MG PO CAPS
0.4000 mg | ORAL_CAPSULE | Freq: Every day | ORAL | Status: DC
  Administered 2020-10-28 – 2020-10-30 (×3): 0.4 mg via ORAL
  Filled 2020-10-26 (×4): qty 1

## 2020-10-26 MED ORDER — MELATONIN 5 MG PO TABS
5.0000 mg | ORAL_TABLET | Freq: Every day | ORAL | Status: DC
  Administered 2020-10-27 – 2020-10-31 (×5): 5 mg via ORAL
  Filled 2020-10-26 (×5): qty 1

## 2020-10-26 MED ORDER — ENOXAPARIN SODIUM 60 MG/0.6ML IJ SOSY
0.5000 mg/kg | PREFILLED_SYRINGE | INTRAMUSCULAR | Status: DC
  Administered 2020-10-26 – 2020-10-31 (×6): 52.5 mg via SUBCUTANEOUS
  Filled 2020-10-26 (×6): qty 0.6

## 2020-10-26 MED ORDER — KETOROLAC TROMETHAMINE 15 MG/ML IJ SOLN
15.0000 mg | Freq: Four times a day (QID) | INTRAMUSCULAR | Status: AC | PRN
  Administered 2020-10-26 – 2020-10-27 (×3): 15 mg via INTRAVENOUS
  Filled 2020-10-26 (×5): qty 1

## 2020-10-26 MED ORDER — AMLODIPINE BESYLATE 5 MG PO TABS
5.0000 mg | ORAL_TABLET | Freq: Every day | ORAL | Status: DC
  Administered 2020-10-27 – 2020-10-31 (×5): 5 mg via ORAL
  Filled 2020-10-26 (×5): qty 1

## 2020-10-26 MED ORDER — FLUTICASONE FUROATE-VILANTEROL 200-25 MCG/INH IN AEPB
1.0000 | INHALATION_SPRAY | Freq: Every day | RESPIRATORY_TRACT | Status: DC
  Administered 2020-10-27 – 2020-11-01 (×6): 1 via RESPIRATORY_TRACT
  Filled 2020-10-26: qty 28

## 2020-10-26 MED ORDER — ONDANSETRON 4 MG PO TBDP
4.0000 mg | ORAL_TABLET | Freq: Once | ORAL | Status: AC
  Administered 2020-10-26: 4 mg via ORAL
  Filled 2020-10-26: qty 1

## 2020-10-26 MED ORDER — ROSUVASTATIN CALCIUM 10 MG PO TABS
10.0000 mg | ORAL_TABLET | Freq: Every day | ORAL | Status: DC
  Administered 2020-10-26 – 2020-10-31 (×6): 10 mg via ORAL
  Filled 2020-10-26 (×7): qty 1

## 2020-10-26 MED ORDER — ONDANSETRON HCL 4 MG PO TABS: 4.0000 mg | ORAL_TABLET | Freq: Four times a day (QID) | ORAL | Status: DC | PRN

## 2020-10-26 MED ORDER — CLOPIDOGREL BISULFATE 75 MG PO TABS
75.0000 mg | ORAL_TABLET | Freq: Every day | ORAL | Status: DC
  Administered 2020-10-27 – 2020-11-01 (×6): 75 mg via ORAL
  Filled 2020-10-26 (×6): qty 1

## 2020-10-26 MED ORDER — IOHEXOL 350 MG/ML SOLN
75.0000 mL | Freq: Once | INTRAVENOUS | Status: AC | PRN
  Administered 2020-10-26: 75 mL via INTRAVENOUS

## 2020-10-26 MED ORDER — METOPROLOL SUCCINATE ER 50 MG PO TB24
100.0000 mg | ORAL_TABLET | Freq: Every day | ORAL | Status: DC
  Administered 2020-10-27 – 2020-10-31 (×5): 100 mg via ORAL
  Filled 2020-10-26 (×5): qty 2

## 2020-10-26 MED ORDER — PANTOPRAZOLE SODIUM 40 MG PO TBEC
40.0000 mg | DELAYED_RELEASE_TABLET | Freq: Every day | ORAL | Status: DC
  Administered 2020-10-27 – 2020-11-01 (×6): 40 mg via ORAL
  Filled 2020-10-26 (×6): qty 1

## 2020-10-26 MED ORDER — ONDANSETRON HCL 4 MG/2ML IJ SOLN
4.0000 mg | Freq: Four times a day (QID) | INTRAMUSCULAR | Status: DC | PRN
Start: 2020-10-26 — End: 2020-11-01
  Filled 2020-10-26: qty 2

## 2020-10-26 MED ORDER — LIDOCAINE 5 % EX PTCH
2.0000 | MEDICATED_PATCH | CUTANEOUS | Status: AC
Start: 2020-10-26 — End: 2020-10-29
  Administered 2020-10-26 – 2020-10-28 (×3): 2 via TRANSDERMAL
  Filled 2020-10-26 (×3): qty 2

## 2020-10-26 MED ORDER — MUPIROCIN 2 % EX OINT
1.0000 "application " | TOPICAL_OINTMENT | Freq: Two times a day (BID) | CUTANEOUS | Status: AC
  Administered 2020-10-27 – 2020-10-30 (×9): 1 via NASAL
  Filled 2020-10-26: qty 22

## 2020-10-26 NOTE — ED Notes (Signed)
Pt unable to stand to give a urine sample at this time due to pain, pt will attempt after pain medicine gives some relief

## 2020-10-26 NOTE — ED Notes (Signed)
Pt to CT

## 2020-10-26 NOTE — Progress Notes (Signed)
PHARMACIST - PHYSICIAN COMMUNICATION  CONCERNING:  Enoxaparin (Lovenox) for DVT Prophylaxis    RECOMMENDATION: Patient was prescribed enoxaparin 40mg  q24 hours for VTE prophylaxis.   Filed Weights   10/26/20 1820  Weight: 104.3 kg (230 lb)    Body mass index is 33.97 kg/m.  Estimated Creatinine Clearance: 55.5 mL/min (by C-G formula based on SCr of 1.2 mg/dL).   Based on [place] patient is candidate for enoxaparin 0.5mg /kg TBW SQ every 24 hours based on BMI being >30.  DESCRIPTION: Pharmacy has adjusted enoxaparin dose per [facility] policy.  Patient is now receiving enoxaparin 52.5 mg every 24 hours    Benita Gutter 10/26/2020 7:13 PM

## 2020-10-26 NOTE — ED Provider Notes (Signed)
[facility]-EMERGENCY DEPARTMENT  ____________________________________________  Time seen: Approximately 5:28 PM  I have reviewed the triage vital signs and the nursing notes.   HISTORY  Chief Complaint Fall   Historian Patient     HPI Chris Kent is a 85 y.o. male presents to the emergency department after patient took a mechanical fall earlier today.  Patient initially complained of right upper quadrant and left-sided flank pain.  Patient is also complaining of pain in his upper back.  Patient states that he is unsure if he is lost consciousness or not.  He denies chest pain, chest tightness or nausea or vomiting.  Patient states that he has been unable to ambulate due to the pain.   Past Medical History:  Diagnosis Date   Anxiety    Arthritis    Asthma    GERD (gastroesophageal reflux disease)    Glaucoma    Glaucoma    History of stroke    Hyperlipidemia    Hyperlipidemia    Hypertension    Peripheral vascular disease ([place])    Sleep apnea      Immunizations up to date:  Yes.     Past Medical History:  Diagnosis Date   Anxiety    Arthritis    Asthma    GERD (gastroesophageal reflux disease)    Glaucoma    Glaucoma    History of stroke    Hyperlipidemia    Hyperlipidemia    Hypertension    Peripheral vascular disease ([place])    Sleep apnea     Patient Active Problem List   Diagnosis Date Noted   Erectile dysfunction due to arterial insufficiency 06/29/2019   Nephrolithiasis 06/25/2018   Anxiety 11/21/2017   Asthma without status asthmaticus 11/21/2017   Esophagitis 11/21/2017   Glaucoma 11/21/2017   History of colonic polyps 11/21/2017   Hyperglycemia 11/21/2017   Hypertension 11/21/2017   Osteoarthritis 11/21/2017   Thyroid nodule 11/21/2017   Transient global amnesia 11/21/2017   Status post total bilateral knee replacement 01/12/2017   Essential hypertension, benign 03/05/2016   Hyperlipidemia 03/05/2016   Carotid stenosis 03/05/2016    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   Thrombocytopenia ([place]) 03/03/2014   Obstructive sleep apnea hypopnea, moderate 06/14/2013   Pulmonary nodules/lesions, multiple 06/14/2013   Renal cyst, acquired 04/06/2013   Enlarged prostate with lower urinary tract symptoms (LUTS) 03/18/2012   Erectile dysfunction 03/18/2012   Increased frequency of urination 03/18/2012   Nocturia 03/18/2012    Past Surgical History:  Procedure Laterality Date   REPLACEMENT TOTAL KNEE BILATERAL      Prior to Admission medications   Medication Sig Start Date End Date Taking? Authorizing Provider  albuterol (PROVENTIL HFA;VENTOLIN HFA) 108 (90 Base) MCG/ACT inhaler Inhale into the lungs. 02/27/16 02/26/17  [provider]  amLODipine (NORVASC) 5 MG tablet Take 5 mg by mouth. 04/07/12   [provider]  Adair Patter 200-25 MCG/INH AEPB INL 1 PUFF INTO THE LUNGS QD 12/29/15   [provider]  cefdinir (OMNICEF) 300 MG capsule  06/23/19   [provider]  cetirizine (ZYRTEC) 10 MG tablet  10/10/17   [provider]  clopidogrel (PLAVIX) 75 MG tablet Take by mouth. 03/07/15   [provider]  cyclobenzaprine (FLEXERIL) 10 MG tablet  01/25/17   [provider]  Dorzolamide HCl-Timolol Mal PF 22.3-6.8 MG/ML SOLN Apply to eye.    [provider]  dorzolamide-timolol (COSOPT) 22.3-6.8 MG/ML ophthalmic solution  02/19/16   [provider]  doxycycline (VIBRA-TABS) 100 MG  tablet Take 100 mg by mouth 2 (two) times daily. 06/21/19   [provider]  fluticasone (FLONASE) 50 MCG/ACT nasal spray SHAKE LQ AND U 2 SPRAYS IEN QD PRF RHINITIS OR ALLERGIES 06/29/15   [provider]  fluticasone Asencion Islam) 50 MCG/ACT nasal spray  02/09/16   [provider]  furosemide (LASIX) 20 MG tablet Take 20 mg by mouth daily. 06/14/20   [provider]  HYDROMET 5-1.5 MG/5ML syrup TK 5 ML PO Q 6 H PRN FOR COUGH 01/17/16   [provider]   hydrOXYzine (ATARAX/VISTARIL) 25 MG tablet Take by mouth. 04/07/17   [provider]  latanoprost (XALATAN) 0.005 % ophthalmic solution INSTILL 1 DROP OU QHS 12/26/15   [provider]  losartan (COZAAR) 50 MG tablet Take 50 mg by mouth.    [provider]  metoprolol succinate (TOPROL-XL) 100 MG 24 hr tablet  02/15/16   [provider]  mupirocin ointment (BACTROBAN) 2 % APPLY TOPICALLY TO THE AFFECTED AREA THREE TIMES DAILY FOR 7 DAYS 06/21/19   [provider]  omeprazole (PRILOSEC) 20 MG capsule TK ONE C PO BID 12/06/15   [provider]  rosuvastatin (CRESTOR) 10 MG tablet Take 10 mg by mouth. 03/18/12   [provider]  sildenafil (REVATIO) 20 MG tablet 2-5 tablets 1 hr prior to intercourse as needed 06/29/19   Stoioff, Ronda Fairly, MD  tamsulosin (FLOMAX) 0.4 MG CAPS capsule Take 0.4 mg by mouth.    [provider]  triamcinolone cream (KENALOG) 0.1 % APPLY EXTERNALLY TO THE AFFECTED AREA TWICE DAILY 03/29/19   [provider]  zolpidem (AMBIEN) 10 MG tablet  03/23/13   [provider]    Allergies Patient has no known allergies.  Family History  Problem Relation Age of Onset   Prostate cancer Father     Social History Social History   Tobacco Use   Smoking status: Never   Smokeless tobacco: Never  Vaping Use   Vaping Use: Never used  Substance Use Topics   Alcohol use: No   Drug use: No     Review of Systems  Constitutional: No fever/chills Eyes:  No discharge ENT: No upper respiratory complaints. Respiratory: no cough. No SOB/ use of accessory muscles to breath Gastrointestinal:   No nausea, no vomiting.  No diarrhea.  No constipation. Musculoskeletal: Patient has upper back pain.  Skin: Negative for rash, abrasions, lacerations, ecchymosis.   ____________________________________________   PHYSICAL EXAM:  VITAL SIGNS: ED Triage Vitals [10/26/20 1304]  Enc Vitals Group     BP  137/62     Pulse Rate (!) 56     Resp 16     Temp 97.9 F (36.6 C)     Temp Source Oral     SpO2 96 %     Weight      Height      Head Circumference      Peak Flow      Pain Score      Pain Loc      Pain Edu?      Excl. in [place]?      Constitutional: Alert and oriented. Well appearing and in no acute distress. Eyes: Conjunctivae are normal. PERRL. EOMI. Head: Atraumatic. ENT:      Nose: No congestion/rhinnorhea.      Mouth/Throat: Mucous membranes are moist.  Neck: No stridor.  No cervical spine tenderness to palpation. Cardiovascular: Normal rate, regular rhythm. Normal S1 and S2.  Good  peripheral circulation. Respiratory: Normal respiratory effort without tachypnea or retractions. Lungs CTAB. Good air entry to the bases with no decreased or absent breath sounds Gastrointestinal: Bowel sounds x 4 quadrants. Soft and nontender to palpation. No guarding or rigidity. No distention. Musculoskeletal: Patient has symmetric strength in the upper and lower extremities.  Full range of motion to all extremities. No obvious deformities noted.  Patient does have paraspinal muscle tenderness along the thoracic spine. Neurologic:  Normal for age. No gross focal neurologic deficits are appreciated.  Skin:  Skin is warm, dry and intact. No rash noted. Psychiatric: Mood and affect are normal for age. Speech and behavior are normal.   ____________________________________________   LABS (all labs ordered are listed, but only abnormal results are displayed)  Labs Reviewed  COMPREHENSIVE METABOLIC PANEL - Abnormal; Notable for the following components:      Result Value   Glucose, Bld 175 (*)    Calcium 8.5 (*)    Total Protein 6.2 (*)    All other components within normal limits  CBC WITH DIFFERENTIAL/PLATELET - Abnormal; Notable for the following components:   Platelets 122 (*)    All other components within normal limits  RESP PANEL BY RT-PCR (FLU A&B, COVID) [id number]    ____________________________________________  EKG   ____________________________________________  RADIOLOGY Unk Pinto, personally viewed and evaluated these images (plain radiographs) as part of my medical decision making, as well as reviewing the written report by the radiologist.  CT CHEST ABDOMEN PELVIS W CONTRAST  Result Date: 10/26/2020 CLINICAL DATA:  Fall from ramp, pain EXAM: CT CHEST, ABDOMEN, AND PELVIS WITH CONTRAST TECHNIQUE: Multidetector CT imaging of the chest, abdomen and pelvis was performed following the standard protocol during bolus administration of intravenous contrast. CONTRAST:  49mL OMNIPAQUE IOHEXOL 350 MG/ML SOLN COMPARISON:  07/16/2017 FINDINGS: CT CHEST FINDINGS Cardiovascular: Aortic atherosclerosis. Cardiomegaly. Left coronary artery calcifications. Trace pericardial effusion. Mediastinum/Nodes: No enlarged mediastinal, hilar, or axillary lymph nodes. Thyroid gland, trachea, and esophagus demonstrate no significant findings. Lungs/Pleura: Diffuse bilateral bronchial wall thickening with dependent bibasilar scarring and or atelectasis. Multiple small pulmonary nodules are present bilaterally, stable compared to prior examination dated 07/16/2017 and benign, for example a 5 mm nodule of the right middle lobe (series 3, image 74). No pleural effusion or pneumothorax. Musculoskeletal: No chest wall mass or suspicious bone lesions identified. 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 There is adjacent perivertebral fat stranding (series 2, image 54). CT ABDOMEN PELVIS FINDINGS Hepatobiliary: No focal liver abnormality is seen. Status post cholecystectomy. No biliary dilatation. Pancreas: Unremarkable. No pancreatic ductal dilatation or surrounding inflammatory changes. Spleen: Normal in size without significant abnormality. Adrenals/Urinary Tract:  Adrenal glands are unremarkable. Kidneys are normal, without renal calculi, solid lesion, or hydronephrosis. Bladder is unremarkable. Stomach/Bowel: Stomach is within normal limits. Appendix appears normal. No evidence of bowel wall thickening, distention, or inflammatory changes. Vascular/Lymphatic: Aortic atherosclerosis. No enlarged abdominal or pelvic lymph nodes. Reproductive: Prostatomegaly. Other: Small, fat containing bilateral inguinal hernias. No abdominopelvic ascites. Musculoskeletal: No acute or significant osseous findings. IMPRESSION: 1. No CT evidence of acute traumatic injury to the organs of the chest, abdomen, or pelvis. 2. 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 There is adjacent perivertebral fat stranding. No obvious epidural hematoma. 3. Multiple small pulmonary nodules  are stable and definitively benign. 4. Prostatomegaly. 5. Coronary artery disease. Aortic Atherosclerosis (ICD10-I70.0). Electronically Signed   By: Eddie Candle M.D.   On: 10/26/2020 14:52    ____________________________________________    PROCEDURES  Procedure(s) performed:     Procedures     Medications  oxyCODONE-acetaminophen (PERCOCET/ROXICET) 5-325 MG per tablet 1 tablet (has no administration in time range)  ondansetron (ZOFRAN-ODT) disintegrating tablet 4 mg (has no administration in time range)  iohexol (OMNIPAQUE) 350 MG/ML injection 75 mL (75 mLs Intravenous Contrast Given 10/26/20 1353)     ____________________________________________   INITIAL IMPRESSION / ASSESSMENT AND PLAN / ED COURSE  Pertinent labs & imaging results that were available during my care of the patient were reviewed by me and considered in my medical decision making (see chart for details).      Assessment and Plan: Fall:  85 year old male presents to the emergency department with abdominal pain and upper back pain  after mechanical fall.  Vital signs were reassuring at triage.  On physical exam, patient was alert and oriented but stated that he could not ambulate due to the pain.  He had symmetric strength in the upper and lower extremities.  CT head and CT cervical spine showed no evidence of intracranial bleed or skull fracture.  CT chest, abdomen and pelvis indicated possible acute fractures of osteophytes of T11 and T12 but no visceral lacerations or other concerning findings.  Patient was given Percocet in the emergency department but states that he still does not feel well enough to return home as he lives independently.  Patient was admitted to the emergency department under the care of attending Dr. Tobie Poet for pain control and social work consult.   ____________________________________________  FINAL CLINICAL IMPRESSION(S) / ED DIAGNOSES  Final diagnoses:  Trauma      NEW MEDICATIONS STARTED DURING THIS VISIT:  ED Discharge Orders     None           This chart was dictated using voice recognition software/Dragon. Despite best efforts to proofread, errors can occur which can change the meaning. Any change was purely unintentional.     Lannie Fields, PA-C 10/26/20 2018    Vladimir Crofts, MD 10/26/20 [phone]

## 2020-10-26 NOTE — H&P (Addendum)
History and Physical   Chris Kent BJS:[id number] DOB: Oct 16, 1935 DOA: 10/26/2020  PCP: Idelle Crouch, MD  Outpatient Specialists: Dr. Bernardo Heater, urology Patient coming from: home via EMS  I have personally briefly reviewed patient's old medical records in [facility].  Chief Concern: Tripped and fell  HPI: Chris Kent is a 85 y.o. male with medical history significant for BPH, hyperlipidemia, hypertension, GERD, CAD, who presents to the emergency department for chief concerns of falling.  He fell while putting a weed feeder up and walked down ramp with a loose board and he tripped over it. He endorses 8/10, sharp pain, that is persistent and improved with the pain medication given in the emergency department. He denies chest pain, shortness of breath, abdominal pain, dysuria, diarrhea, cough, nausea, vomiting. He denies lost of consciousness.   Social history: He currently lives by himself.  He is a widower.  His wife passed away in Mar 09, 2020.  ROS: Constitutional: no weight change, no fever ENT/Mouth: no sore throat, no rhinorrhea Eyes: no eye pain, no vision changes Cardiovascular: no chest pain, no dyspnea,  no edema, no palpitations Respiratory: no cough, no sputum, no wheezing Gastrointestinal: no nausea, no vomiting, no diarrhea, no constipation Genitourinary: no urinary incontinence, no dysuria, no hematuria Musculoskeletal: no arthralgias, no myalgias, +mid back pain Skin: no skin lesions, no pruritus, Neuro: + weakness, no loss of consciousness, no syncope Psych: no anxiety, no depression, no decrease appetite Heme/Lymph: no bruising, no bleeding  ED Course: Discussed with emergency medicine provider, patient requiring hospitalization for chief concerns of mechanical fall and not able to ambulate.  Vitals in the emergency department was remarkable for temperature 97.9, respiration rate of 16, heart rate of 56, initial blood pressure 137/62, improved to  147/89, SPO2 of 96% on room air.  Labs in the emergency department was remarkable for serum sodium 140, potassium 4.0, chloride 110, bicarb 25, BUN 18, serum creatinine of 1.20, nonfasting blood glucose 175, EGFR greater than 60, WBC 10.2, hemoglobin 16.3, platelets 122.  COVID is pending  Assessment/Plan  Active Problems:   Essential hypertension, benign   Hyperlipidemia   Anxiety   Asthma without status asthmaticus   Hypertension   Obstructive sleep apnea hypopnea, moderate   Status post total bilateral knee replacement   Fall   # Mechanical fall - Patient admits states that he cannot go home and he is unable to care for himself and he is requesting placement - Check for reversible etiology including procalcitonin, B12, vitamin D, TSH - PT, OT, TOC consulted - Fall precautions  # Back pain secondary to mechanical fall - Pain control with lidocaine patch daily, 3 days ordered - Ketorolac 15 mg IV every 6 hours as needed for moderate pain, 1 day ordered; morphine 1 mg IV every 4 hours as needed for severe pain, 2 doses ordered  # Acute fracture of the anterior bridging osteophyte of T11 and T12-secondary to mechanical fall, pain control as above  # History of hypertension-amlodipine 5 mg daily, losartan 50 mg twice daily, metoprolol succinate 100 mg daily  # Hyperlipidemia-rosuvastatin 10 mg nightly  # BPH-Flomax resumed  # History of asthma without status asthmaticus-resumed home Breo Ellipta daily, albuterol nebulizer every 6 hours as needed for wheezing and shortness of breath  # Post discharge care-I discussed with patient extensively that if able, I recommended patient move in with his daughter who lives in [place].  I expressed to patient that patient will receive better care  from family members then at a nursing facility. [facility] consulted, PT, OT  Chart reviewed.   DVT prophylaxis: Enoxaparin weightbase, subcutaneous, every 24 hours Code Status: Full code Diet: Heart  healthy Family Communication: No Disposition Plan: Pending clinical course Consults called: TOC, PT, OT Admission status: MedSurg, observation, no telemetry  Past Medical History:  Diagnosis Date   Anxiety    Arthritis    Asthma    GERD (gastroesophageal reflux disease)    Glaucoma    Glaucoma    History of stroke    Hyperlipidemia    Hyperlipidemia    Hypertension    Peripheral vascular disease ([place])    Sleep apnea    Past Surgical History:  Procedure Laterality Date   REPLACEMENT TOTAL KNEE BILATERAL     Social History:  reports that he has never smoked. He has never used smokeless tobacco. He reports that he does not drink alcohol and does not use drugs.  No Known Allergies Family History  Problem Relation Age of Onset   Prostate cancer Father    Family history: Family history reviewed and not pertinent  Prior to Admission medications   Medication Sig Start Date End Date Taking? Authorizing Provider  albuterol (PROVENTIL HFA;VENTOLIN HFA) 108 (90 Base) MCG/ACT inhaler Inhale into the lungs. 02/27/16 02/26/17  [provider]  amLODipine (NORVASC) 5 MG tablet Take 5 mg by mouth. 04/07/12   [provider]  Adair Patter 200-25 MCG/INH AEPB INL 1 PUFF INTO THE LUNGS QD 12/29/15   [provider]  cefdinir (OMNICEF) 300 MG capsule  06/23/19   [provider]  cetirizine (ZYRTEC) 10 MG tablet  10/10/17   [provider]  clopidogrel (PLAVIX) 75 MG tablet Take by mouth. 03/07/15   [provider]  cyclobenzaprine (FLEXERIL) 10 MG tablet  01/25/17   [provider]  Dorzolamide HCl-Timolol Mal PF 22.3-6.8 MG/ML SOLN Apply to eye.    [provider]  dorzolamide-timolol (COSOPT) 22.3-6.8 MG/ML ophthalmic solution  02/19/16   [provider]  doxycycline (VIBRA-TABS) 100 MG tablet Take 100 mg by mouth 2 (two) times daily. 06/21/19   [provider]  fluticasone (FLONASE) 50 MCG/ACT nasal spray  SHAKE LQ AND U 2 SPRAYS IEN QD PRF RHINITIS OR ALLERGIES 06/29/15   [provider]  fluticasone Asencion Islam) 50 MCG/ACT nasal spray  02/09/16   [provider]  furosemide (LASIX) 20 MG tablet Take 20 mg by mouth daily. 06/14/20   [provider]  HYDROMET 5-1.5 MG/5ML syrup TK 5 ML PO Q 6 H PRN FOR COUGH 01/17/16   [provider]  hydrOXYzine (ATARAX/VISTARIL) 25 MG tablet Take by mouth. 04/07/17   [provider]  latanoprost (XALATAN) 0.005 % ophthalmic solution INSTILL 1 DROP OU QHS 12/26/15   [provider]  losartan (COZAAR) 50 MG tablet Take 50 mg by mouth.    [provider]  metoprolol succinate (TOPROL-XL) 100 MG 24 hr tablet  02/15/16   [provider]  mupirocin ointment (BACTROBAN) 2 % APPLY TOPICALLY TO THE AFFECTED AREA THREE TIMES DAILY FOR 7 DAYS 06/21/19   [provider]  omeprazole (PRILOSEC) 20 MG capsule TK ONE C PO BID 12/06/15   [provider]  rosuvastatin (CRESTOR) 10 MG tablet Take 10 mg by mouth. 03/18/12   [provider]  sildenafil (REVATIO) 20 MG tablet 2-5 tablets 1 hr prior to intercourse as needed 06/29/19   Stoioff, Ronda Fairly, MD  tamsulosin (FLOMAX) 0.4 MG  CAPS capsule Take 0.4 mg by mouth.    [provider]  triamcinolone cream (KENALOG) 0.1 % APPLY EXTERNALLY TO THE AFFECTED AREA TWICE DAILY 03/29/19   [provider]  zolpidem (AMBIEN) 10 MG tablet  03/23/13   [provider]   Physical Exam: Vitals:   10/26/20 1304 10/26/20 1626 10/26/20 1820  BP: 137/62 (!) 151/76 (!) 147/83  Pulse: (!) 56 66 71  Resp: '16 16 18  ' Temp: 97.9 F (36.6 C)    TempSrc: Oral    SpO2: 96% 97% 96%  Weight:   104.3 kg  Height:   '5\' 9"'  (1.753 m)   Constitutional: appears age-appropriate, NAD, calm, comfortable Eyes: PERRL, lids and conjunctivae normal ENMT: Mucous membranes are moist. Posterior pharynx clear of any exudate or lesions. Age-appropriate dentition.  Hearing appropriate Neck: normal, supple, no masses, no thyromegaly Respiratory: clear to auscultation bilaterally, no wheezing, no crackles. Normal respiratory effort. No accessory muscle use.  Cardiovascular: Regular rate and rhythm, no murmurs / rubs / gallops. No extremity edema. 2+ pedal pulses. No carotid bruits.  Abdomen: Obese abdomen, no tenderness, no masses palpated, no hepatosplenomegaly. Bowel sounds positive.  Musculoskeletal: no clubbing / cyanosis. No joint deformity upper and lower extremities. Good ROM, no contractures, no atrophy. Normal muscle tone.  Skin: no rashes, lesions, ulcers. No induration Neurologic: Sensation intact. Strength 5/5 in all 4.  Psychiatric: Normal judgment and insight. Alert and oriented x 3. Normal mood.   EKG: independently reviewed, showing sinus rhythm with rate of 73, Qtc 491  Imaging on Admission: I personally reviewed and I agree with radiologist reading as below.  CT HEAD WO CONTRAST (5MM)  Result Date: 10/26/2020 CLINICAL DATA:  Cerebral hemorrhage suspected Patient reports fall this morning. EXAM: CT HEAD WITHOUT CONTRAST TECHNIQUE: Contiguous axial images were obtained from the base of the skull through the vertex without intravenous contrast. COMPARISON:  Head CT 02/20/2018 FINDINGS: Brain: Stable degree of atrophy and chronic small vessel ischemia from prior exam. No intracranial hemorrhage, mass effect, or midline shift. No hydrocephalus. The basilar cisterns are patent. No evidence of territorial infarct or acute ischemia. No extra-axial or intracranial fluid collection. Vascular: Atherosclerosis of skullbase vasculature without hyperdense vessel or abnormal calcification. Skull: No fracture or focal lesion. Sinuses/Orbits: Small mucous retention cyst left maxillary sinus no acute findings. No mastoid effusion. Other: None. IMPRESSION: 1. No acute intracranial abnormality. No skull fracture. 2. Stable atrophy and chronic small vessel ischemia.  Electronically Signed   By: Keith Rake M.D.   On: 10/26/2020 18:23   CT Cervical Spine Wo Contrast  Result Date: 10/26/2020 CLINICAL DATA:  Neck trauma (Age >= 65y) Fall this morning. EXAM: CT CERVICAL SPINE WITHOUT CONTRAST TECHNIQUE: Multidetector CT imaging of the cervical spine was performed without intravenous contrast. Multiplanar CT image reconstructions were also generated. COMPARISON:  Cervical spine CT 02/20/2018 FINDINGS: Alignment: No traumatic subluxation. Skull base and vertebrae: Interval posterior rod with intrapedicular screw fusion from C4 through T2 fixating prior C7 fracture. Intact hardware. Previous fracture has healed. There is no new or acute fracture. Bulky flowing anterior osteophytes throughout the cervical spine are not significantly changed. Chronic degenerative change at C1-C2 with partial bony ankylosis, also stable. Intact dens and skull base. Soft tissues and spinal canal: Partially obscured by streak artifact from spinal fusion hardware. There is no prevertebral soft tissue edema. Disc levels: Large flowing anterior osteophytes. No high-grade canal stenosis. Upper chest: Assessed on earlier chest CT, reported separately. No acute apical findings. Other:  None. IMPRESSION: 1. No acute fracture or subluxation of the cervical spine. 2. Interval posterior rod with intrapedicular screw fusion from C4 through T2 fixating prior C7 fracture. Previous fracture has healed. Intact hardware. 3. Large flowing anterior osteophytes throughout the cervical spine. Electronically Signed   By: Keith Rake M.D.   On: 10/26/2020 18:28   CT CHEST ABDOMEN PELVIS W CONTRAST  Result Date: 10/26/2020 CLINICAL DATA:  Fall from ramp, pain EXAM: CT CHEST, ABDOMEN, AND PELVIS WITH CONTRAST TECHNIQUE: Multidetector CT imaging of the chest, abdomen and pelvis was performed following the standard protocol during bolus administration of intravenous contrast. CONTRAST:  76m OMNIPAQUE IOHEXOL 350  MG/ML SOLN COMPARISON:  07/16/2017 FINDINGS: CT CHEST FINDINGS Cardiovascular: Aortic atherosclerosis. Cardiomegaly. Left coronary artery calcifications. Trace pericardial effusion. Mediastinum/Nodes: No enlarged mediastinal, hilar, or axillary lymph nodes. Thyroid gland, trachea, and esophagus demonstrate no significant findings. Lungs/Pleura: Diffuse bilateral bronchial wall thickening with dependent bibasilar scarring and or atelectasis. Multiple small pulmonary nodules are present bilaterally, stable compared to prior examination dated 07/16/2017 and benign, for example a 5 mm nodule of the right middle lobe (series 3, image 74). No pleural effusion or pneumothorax. Musculoskeletal: No chest wall mass or suspicious bone lesions identified. 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 There is adjacent perivertebral fat stranding (series 2, image 54). CT ABDOMEN PELVIS FINDINGS Hepatobiliary: No focal liver abnormality is seen. Status post cholecystectomy. No biliary dilatation. Pancreas: Unremarkable. No pancreatic ductal dilatation or surrounding inflammatory changes. Spleen: Normal in size without significant abnormality. Adrenals/Urinary Tract: Adrenal glands are unremarkable. Kidneys are normal, without renal calculi, solid lesion, or hydronephrosis. Bladder is unremarkable. Stomach/Bowel: Stomach is within normal limits. Appendix appears normal. No evidence of bowel wall thickening, distention, or inflammatory changes. Vascular/Lymphatic: Aortic atherosclerosis. No enlarged abdominal or pelvic lymph nodes. Reproductive: Prostatomegaly. Other: Small, fat containing bilateral inguinal hernias. No abdominopelvic ascites. Musculoskeletal: No acute or significant osseous findings. IMPRESSION: 1. No CT evidence of acute traumatic injury to the organs of the chest, abdomen, or pelvis. 2. There is  ankylosis of the thoracic and lumbar spine, with a new, likely acute fracture of the anterior bridging osteophytes of T11 and T12. There is adjacent perivertebral fat stranding. No obvious epidural hematoma. 3. Multiple small pulmonary nodules are stable and definitively benign. 4. Prostatomegaly. 5. Coronary artery disease. Aortic Atherosclerosis (ICD10-I70.0). Electronically Signed   By: AEddie CandleM.D.   On: 10/26/2020 14:52    Labs on Admission: I have personally reviewed following labs  CBC: Recent Labs  Lab 10/26/20 1307  WBC 10.2  NEUTROABS 7.0  HGB 16.3  HCT 46.5  MCV 92.6  PLT 1572   Basic Metabolic Panel: Recent Labs  Lab 10/26/20 1307  NA 140  K 4.0  CL 110  CO2 25  GLUCOSE 175*  BUN 18  CREATININE 1.20  CALCIUM 8.5*   GFR: Estimated Creatinine Clearance: 55.5 mL/min (by C-G formula based on SCr of 1.2 mg/dL).  Liver Function Tests: Recent Labs  Lab 10/26/20 1307  AST 18  ALT 15  ALKPHOS 61  BILITOT 1.1  PROT 6.2*  ALBUMIN 3.5   Urine analysis:    Component Value Date/Time   COLORURINE STRAW (A) 07/17/2017 2131   APPEARANCEUR Clear 11/21/2017 1011   LABSPEC 1.009 07/17/2017 2131   LABSPEC 1.013 09/20/2012 0122   PHURINE 7.0 07/17/2017 2131   GLUCOSEU Negative 11/21/2017 1011  GLUCOSEU Negative 09/20/2012 0122   HGBUR SMALL (A) 07/17/2017 2131   BILIRUBINUR Negative 11/21/2017 1011   BILIRUBINUR Negative 09/20/2012 0122   KETONESUR NEGATIVE 07/17/2017 2131   PROTEINUR Negative 11/21/2017 1011   PROTEINUR NEGATIVE 07/17/2017 2131   NITRITE Negative 11/21/2017 1011   NITRITE NEGATIVE 07/17/2017 2131   LEUKOCYTESUR Negative 11/21/2017 1011   LEUKOCYTESUR Negative 09/20/2012 0122   Dr. Tobie Poet [facility]  If 7PM-7AM, please contact overnight-coverage provider If 7AM-7PM, please contact day coverage provider [email or web address]  10/26/2020, 7:08 PM

## 2020-10-26 NOTE — ED Triage Notes (Signed)
Pt comes into the Ed via EMS from home with c/o tripped and fell landing in to the grass, pt c/o RUQ and left flank pain, on plavix  139/75 CBG150 60HR 94%RA

## 2020-10-26 NOTE — ED Provider Notes (Signed)
Emergency Medicine Provider Triage Evaluation Note  Chris Kent , a 85 y.o. male  was evaluated in triage.  Pt complains of left rib, left back, left abdomen pain.  Patient was in his shed, tripped over a broken board and fell off the ramp onto the ground.  He landed on the left side.  He did not hit his head or lose consciousness.  He is complaining of sharp left rib, left upper abdominal pain and left back pain.  Denies frank shortness of breath.  Known substernal chest pain.  No numbness or tingling in his lower extremities, saddle anesthesia or paresthesias..  Review of Systems  Positive: Left rib, left abdomen, left back pain Negative: Did not his head, lose consciousness, no neck pain.  No radicular or neurodeficit symptoms in the lower extremities  Physical Exam  BP 137/62   Pulse (!) 56   Temp 97.9 F (36.6 C) (Oral)   Resp 16   SpO2 96%  Gen:   Awake, no distress   Resp:  Normal effort  MSK:   Moves extremities without difficulty  Other:  Patient is very tender along the inferior left anterolateral ribs, left upper quadrant abdomen, left flank and left paraspinal muscle groups.  Patient is guarding with palpation over his abdomen  Medical Decision Making  Medically screening exam initiated at 1:10 PM.  Appropriate orders placed.  Chris Kent was informed that the remainder of the evaluation will be completed by another provider, this initial triage assessment does not replace that evaluation, and the importance of remaining in the ED until their evaluation is complete.  Patient fell off a ramp onto his left side.  Complaining of left rib, left abdomen and left back pain.  No concerning neuro deficits on exam.  Given the extreme tenderness in the left upper quadrant with guarding and landing on the side patient will have CT scans of the chest, abdomen and pelvis.  Basic labs will also be obtained.   Brynda Peon 10/26/20 1313    Blake Divine,  MD 10/26/20 1710

## 2020-10-26 NOTE — ED Triage Notes (Signed)
Pt denies any abd now, states he had some initially but it has resolved, pt c/o mid/lower back pain. Pt is a/ox4, in NAD

## 2020-10-27 DIAGNOSIS — F419 Anxiety disorder, unspecified: Secondary | ICD-10-CM | POA: Diagnosis not present

## 2020-10-27 DIAGNOSIS — J4520 Mild intermittent asthma, uncomplicated: Secondary | ICD-10-CM | POA: Diagnosis not present

## 2020-10-27 DIAGNOSIS — I10 Essential (primary) hypertension: Secondary | ICD-10-CM | POA: Diagnosis not present

## 2020-10-27 DIAGNOSIS — W19XXXA Unspecified fall, initial encounter: Secondary | ICD-10-CM | POA: Diagnosis not present

## 2020-10-27 LAB — BASIC METABOLIC PANEL
Anion gap: 7 (ref 5–15)
BUN: 18 mg/dL (ref 8–23)
CO2: 27 mmol/L (ref 22–32)
Calcium: 8.3 mg/dL — ABNORMAL LOW (ref 8.9–10.3)
Chloride: 108 mmol/L (ref 98–111)
Creatinine, Ser: 0.71 mg/dL (ref 0.61–1.24)
GFR, Estimated: >60 mL/min (ref >60)
Glucose, Bld: 107 mg/dL — ABNORMAL HIGH (ref 70–99)
Potassium: 3.8 mmol/L (ref 3.5–5.1)
Sodium: 142 mmol/L (ref 135–145)

## 2020-10-27 LAB — CBC
HCT: 46.2 % (ref 39.0–52.0)
Hemoglobin: 15.3 g/dL (ref 13.0–17.0)
MCH: 30.5 pg (ref 26.0–34.0)
MCHC: 33.1 g/dL (ref 30.0–36.0)
MCV: 92.2 fL (ref 80.0–100.0)
Platelets: 100 10*3/uL — ABNORMAL LOW (ref 150–400)
RBC: 5.01 MIL/uL (ref 4.22–5.81)
RDW: 15 % (ref 11.5–15.5)
WBC: 7.6 10*3/uL (ref 4.0–10.5)
nRBC: 0 % (ref 0.0–0.2)

## 2020-10-27 LAB — VITAMIN B12: Vitamin B-12: 156 pg/mL — ABNORMAL LOW (ref 180–914)

## 2020-10-27 LAB — VITAMIN D 25 HYDROXY (VIT D DEFICIENCY, FRACTURES): Vit D, 25-Hydroxy: 28.83 ng/mL — ABNORMAL LOW (ref 30–100)

## 2020-10-27 MED ORDER — ACETAMINOPHEN 500 MG PO TABS
1000.0000 mg | ORAL_TABLET | Freq: Three times a day (TID) | ORAL | Status: DC
Start: 2020-10-27 — End: 2020-11-01
  Administered 2020-10-27 – 2020-11-01 (×15): 1000 mg via ORAL
  Filled 2020-10-27 (×16): qty 2

## 2020-10-27 MED ORDER — OXYCODONE HCL 5 MG PO TABS
5.0000 mg | ORAL_TABLET | Freq: Four times a day (QID) | ORAL | Status: DC | PRN
Start: 2020-10-27 — End: 2020-11-01
  Administered 2020-10-27 – 2020-11-01 (×13): 5 mg via ORAL
  Filled 2020-10-27 (×13): qty 1

## 2020-10-27 MED ORDER — DORZOLAMIDE HCL-TIMOLOL MAL 2-0.5 % OP SOLN
1.0000 [drp] | Freq: Two times a day (BID) | OPHTHALMIC | Status: DC
  Administered 2020-10-27 – 2020-11-01 (×11): 1 [drp] via OPHTHALMIC
  Filled 2020-10-27: qty 10

## 2020-10-27 MED ORDER — LATANOPROST 0.005 % OP SOLN
1.0000 [drp] | Freq: Every day | OPHTHALMIC | Status: DC
  Administered 2020-10-27 – 2020-10-31 (×5): 1 [drp] via OPHTHALMIC
  Filled 2020-10-27: qty 2.5

## 2020-10-27 NOTE — Progress Notes (Signed)
[facility] PROGRESS NOTE    LOGAN BALTIMORE  EYC:[id number] DOB: May 27, 1935 DOA: 10/26/2020 PCP: Idelle Crouch, MD      Brief Narrative:  Mr. Domine is a 85 y.o. M with HTN, obesity, who presented after a fall.  Fell and had flank and upper back pain. In the ER, CT imaging showed no rib or vertebral fractures.        Assessment & Plan:  Back pain Fall resulted in likely contusion, muscle strain.  The chipped osteophyte may be contributing to inflammation/pain but is not a structural bone has no physical limitations nor treatment necessary - Schedule acetaminophen - PRN oxycodone - PT eval   Hypertension -Continue amlodipine  Atherosclerosis of coronary arteries and peripheral arteries, secondary prevention -Continue Plavix, Crestor, metoprolol, ARB  COPD -Continue ICS/LABA  Glaucoma -Continue eye drops  BPH -Continue Flomax  Obesity  BMI >30 -Continue PPI   Thrombocytopenia Mild - Trend with PCP      Disposition: Status is: Observation  The patient remains OBS appropriate and will d/c before 2 midnights.  Dispo: The patient is from: Home              Anticipated d/c is to: Home              Patient currently is medically stable to d/c.   Difficult to place patient No       Level of care: Med-Surg       MDM: The below labs and imaging reports were reviewed and summarized above.  Medication management as above.    DVT prophylaxis: Place TED hose Start: 10/26/20 1904  Code Status: FULL           Subjective: Patient has back pain.  No fever, confusion.  Objective: Vitals:   10/26/20 2206 10/26/20 2339 10/27/20 0459 10/27/20 0814  BP:  128/83 122/73 (!) 145/77  Pulse:  70 64 65  Resp:  20 20 16   Temp:  98.6 F (37 C) 97.8 F (36.6 C) 97.6 F (36.4 C)  TempSrc:   Oral   SpO2: 94% 97% 96%   Weight:      Height:        Intake/Output Summary (Last 24 hours) at 10/27/2020 1302 Last data filed  at 10/27/2020 0500 Gross per 24 hour  Intake --  Output 600 ml  Net -600 ml   Filed Weights   10/26/20 1820  Weight: 104.3 kg    Examination: General appearance: elderly adult male, alert and in no acute distress.   HEENT: Anicteric, conjunctiva pink, lids and lashes normal. No nasal deformity, discharge, epistaxis.  Lips moist.   Skin: Warm and dry.  no jaundice.  No suspicious rashes or lesions. Cardiac: RRR, nl S1-S2, no murmurs appreciated.    Respiratory: Normal respiratory rate and rhythm.  CTAB without rales or wheezes. Abdomen: Abdomen soft.  No TTP. No ascites, distension, hepatosplenomegaly.   MSK: No deformities or effusions. Neuro: Awake and alert.  EOMI, moves upper extremities with generalized weakness, does not lift himself off bed due to pain.  Speech normal   Psych: Sensorium intact and responding to questions, attention normal. Affect appropraite.  Judgment and insight appear normal.    Data Reviewed: I have personally reviewed following labs and imaging studies:  CBC: Recent Labs  Lab 10/26/20 1307 10/27/20 0438  WBC 10.2 7.6  NEUTROABS 7.0  --   HGB 16.3 15.3  HCT 46.5 46.2  MCV 92.6 92.2  PLT 122*  409*   Basic Metabolic Panel: Recent Labs  Lab 10/26/20 1307 10/27/20 0438  NA 140 142  K 4.0 3.8  CL 110 108  CO2 25 27  GLUCOSE 175* 107*  BUN 18 18  CREATININE 1.20 0.71  CALCIUM 8.5* 8.3*   GFR: Estimated Creatinine Clearance: 83.2 mL/min (by C-G formula based on SCr of 0.71 mg/dL). Liver Function Tests: Recent Labs  Lab 10/26/20 1307  AST 18  ALT 15  ALKPHOS 61  BILITOT 1.1  PROT 6.2*  ALBUMIN 3.5   No results for input(s): LIPASE, AMYLASE in the last 168 hours. No results for input(s): AMMONIA in the last 168 hours. Coagulation Profile: No results for input(s): INR, PROTIME in the last 168 hours. Cardiac Enzymes: Recent Labs  Lab 10/26/20 1925  CKTOTAL 358   BNP (last 3 results) No results for input(s): PROBNP in the last  8760 hours. HbA1C: No results for input(s): HGBA1C in the last 72 hours. CBG: No results for input(s): GLUCAP in the last 168 hours. Lipid Profile: No results for input(s): CHOL, HDL, LDLCALC, TRIG, CHOLHDL, LDLDIRECT in the last 72 hours. Thyroid Function Tests: Recent Labs    10/26/20 1905  TSH 1.295   Anemia Panel: Recent Labs    10/26/20 1906  VITAMINB12 156*   Urine analysis:    Component Value Date/Time   COLORURINE YELLOW (A) 10/26/2020 1915   APPEARANCEUR CLEAR (A) 10/26/2020 1915   APPEARANCEUR Clear 11/21/2017 1011   LABSPEC 1.035 (H) 10/26/2020 1915   LABSPEC 1.013 09/20/2012 0122   PHURINE 6.0 10/26/2020 1915   GLUCOSEU NEGATIVE 10/26/2020 1915   GLUCOSEU Negative 09/20/2012 0122   HGBUR MODERATE (A) 10/26/2020 1915   BILIRUBINUR NEGATIVE 10/26/2020 1915   BILIRUBINUR Negative 11/21/2017 1011   BILIRUBINUR Negative 09/20/2012 0122   KETONESUR NEGATIVE 10/26/2020 1915   PROTEINUR 30 (A) 10/26/2020 1915   NITRITE NEGATIVE 10/26/2020 1915   LEUKOCYTESUR NEGATIVE 10/26/2020 1915   LEUKOCYTESUR Negative 09/20/2012 0122   Sepsis Labs: @LABRCNTIP (procalcitonin:4,lacticacidven:4)  ) Recent Results (from the past 240 hour(s))  Resp Panel by RT-PCR (Flu A&B, Covid) Nasopharyngeal Swab     Status: None   Collection Time: 10/26/20  5:23 PM   Specimen: Nasopharyngeal Swab; Nasopharyngeal(NP) swabs in vial transport medium  Result Value Ref Range Status   SARS Coronavirus 2 by RT PCR NEGATIVE NEGATIVE Final    Comment: (NOTE) SARS-CoV-2 target nucleic acids are NOT DETECTED.  The SARS-CoV-2 RNA is generally detectable in upper respiratory specimens during the acute phase of infection. The lowest concentration of SARS-CoV-2 viral copies this assay can detect is 138 copies/mL. A negative result does not preclude SARS-Cov-2 infection and should not be used as the sole basis for treatment or other patient management decisions. A negative result may occur with   improper specimen collection/handling, submission of specimen other than nasopharyngeal swab, presence of viral mutation(s) within the areas targeted by this assay, and inadequate number of viral copies(<138 copies/mL). A negative result must be combined with clinical observations, patient history, and epidemiological information. The expected result is Negative.  Fact Sheet for Patients:  EntrepreneurPulse.com.au  Fact Sheet for Healthcare Providers:  IncredibleEmployment.be  This test is no t yet approved or cleared by the [place] FDA and  has been authorized for detection and/or diagnosis of SARS-CoV-2 by FDA under an Emergency Use Authorization (EUA). This EUA will remain  in effect (meaning this test can be used) for the duration of the COVID-19 declaration under Section 564(b)(1) of the Act,  21 U.S.C.section 360bbb-3(b)(1), unless the authorization is terminated  or revoked sooner.       Influenza A by PCR NEGATIVE NEGATIVE Final   Influenza B by PCR NEGATIVE NEGATIVE Final    Comment: (NOTE) The Xpert Xpress SARS-CoV-2/FLU/RSV plus assay is intended as an aid in the diagnosis of influenza from Nasopharyngeal swab specimens and should not be used as a sole basis for treatment. Nasal washings and aspirates are unacceptable for Xpert Xpress SARS-CoV-2/FLU/RSV testing.  Fact Sheet for Patients: EntrepreneurPulse.com.au  Fact Sheet for Healthcare Providers: IncredibleEmployment.be  This test is not yet approved or cleared by the [place] FDA and has been authorized for detection and/or diagnosis of SARS-CoV-2 by FDA under an Emergency Use Authorization (EUA). This EUA will remain in effect (meaning this test can be used) for the duration of the COVID-19 declaration under Section 564(b)(1) of the Act, 21 U.S.C. section 360bbb-3(b)(1), unless the authorization is terminated  or revoked.  Performed at [facility], [address], [place], [place] [address]   Surgical PCR screen     Status: None   Collection Time: 10/26/20 10:17 PM   Specimen: Nasal Mucosa; Nasal Swab  Result Value Ref Range Status   MRSA, PCR NEGATIVE NEGATIVE Final   Staphylococcus aureus NEGATIVE NEGATIVE Final    Comment: (NOTE) The Xpert SA Assay (FDA approved for NASAL specimens in patients 82 years of age and older), is one component of a comprehensive surveillance program. It is not intended to diagnose infection nor to guide or monitor treatment. Performed at [facility], [address] [place],  [address]          Radiology Studies: CT HEAD WO CONTRAST (5MM)  Result Date: 10/26/2020 CLINICAL DATA:  Cerebral hemorrhage suspected Patient reports fall this morning. EXAM: CT HEAD WITHOUT CONTRAST TECHNIQUE: Contiguous axial images were obtained from the base of the skull through the vertex without intravenous contrast. COMPARISON:  Head CT 02/20/2018 FINDINGS: Brain: Stable degree of atrophy and chronic small vessel ischemia from prior exam. No intracranial hemorrhage, mass effect, or midline shift. No hydrocephalus. The basilar cisterns are patent. No evidence of territorial infarct or acute ischemia. No extra-axial or intracranial fluid collection. Vascular: Atherosclerosis of skullbase vasculature without hyperdense vessel or abnormal calcification. Skull: No fracture or focal lesion. Sinuses/Orbits: Small mucous retention cyst left maxillary sinus no acute findings. No mastoid effusion. Other: None. IMPRESSION: 1. No acute intracranial abnormality. No skull fracture. 2. Stable atrophy and chronic small vessel ischemia. Electronically Signed   By: Keith Rake M.D.   On: 10/26/2020 18:23   CT Cervical Spine Wo Contrast  Result Date: 10/26/2020 CLINICAL DATA:  Neck trauma (Age >= 65y) Fall this morning. EXAM: CT CERVICAL SPINE WITHOUT CONTRAST  TECHNIQUE: Multidetector CT imaging of the cervical spine was performed without intravenous contrast. Multiplanar CT image reconstructions were also generated. COMPARISON:  Cervical spine CT 02/20/2018 FINDINGS: Alignment: No traumatic subluxation. Skull base and vertebrae: Interval posterior rod with intrapedicular screw fusion from C4 through T2 fixating prior C7 fracture. Intact hardware. Previous fracture has healed. There is no new or acute fracture. Bulky flowing anterior osteophytes throughout the cervical spine are not significantly changed. Chronic degenerative change at C1-C2 with partial bony ankylosis, also stable. Intact dens and skull base. Soft tissues and spinal canal: Partially obscured by streak artifact from spinal fusion hardware. There is no prevertebral soft tissue edema. Disc levels: Large flowing anterior osteophytes. No high-grade canal stenosis. Upper chest: Assessed on earlier chest CT,  reported separately. No acute apical findings. Other: None. IMPRESSION: 1. No acute fracture or subluxation of the cervical spine. 2. Interval posterior rod with intrapedicular screw fusion from C4 through T2 fixating prior C7 fracture. Previous fracture has healed. Intact hardware. 3. Large flowing anterior osteophytes throughout the cervical spine. Electronically Signed   By: Keith Rake M.D.   On: 10/26/2020 18:28   CT CHEST ABDOMEN PELVIS W CONTRAST  Result Date: 10/26/2020 CLINICAL DATA:  Fall from ramp, pain EXAM: CT CHEST, ABDOMEN, AND PELVIS WITH CONTRAST TECHNIQUE: Multidetector CT imaging of the chest, abdomen and pelvis was performed following the standard protocol during bolus administration of intravenous contrast. CONTRAST:  25mL OMNIPAQUE IOHEXOL 350 MG/ML SOLN COMPARISON:  07/16/2017 FINDINGS: CT CHEST FINDINGS Cardiovascular: Aortic atherosclerosis. Cardiomegaly. Left coronary artery calcifications. Trace pericardial effusion. Mediastinum/Nodes: No enlarged mediastinal, hilar, or  axillary lymph nodes. Thyroid gland, trachea, and esophagus demonstrate no significant findings. Lungs/Pleura: Diffuse bilateral bronchial wall thickening with dependent bibasilar scarring and or atelectasis. Multiple small pulmonary nodules are present bilaterally, stable compared to prior examination dated 07/16/2017 and benign, for example a 5 mm nodule of the right middle lobe (series 3, image 74). No pleural effusion or pneumothorax. Musculoskeletal: No chest wall mass or suspicious bone lesions identified. 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 There is adjacent perivertebral fat stranding (series 2, image 54). CT ABDOMEN PELVIS FINDINGS Hepatobiliary: No focal liver abnormality is seen. Status post cholecystectomy. No biliary dilatation. Pancreas: Unremarkable. No pancreatic ductal dilatation or surrounding inflammatory changes. Spleen: Normal in size without significant abnormality. Adrenals/Urinary Tract: Adrenal glands are unremarkable. Kidneys are normal, without renal calculi, solid lesion, or hydronephrosis. Bladder is unremarkable. Stomach/Bowel: Stomach is within normal limits. Appendix appears normal. No evidence of bowel wall thickening, distention, or inflammatory changes. Vascular/Lymphatic: Aortic atherosclerosis. No enlarged abdominal or pelvic lymph nodes. Reproductive: Prostatomegaly. Other: Small, fat containing bilateral inguinal hernias. No abdominopelvic ascites. Musculoskeletal: No acute or significant osseous findings. IMPRESSION: 1. No CT evidence of acute traumatic injury to the organs of the chest, abdomen, or pelvis. 2. 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 There is adjacent perivertebral fat stranding. No obvious epidural hematoma. 3. Multiple small pulmonary nodules  are stable and definitively benign. 4. Prostatomegaly. 5. Coronary artery disease. Aortic Atherosclerosis (ICD10-I70.0). Electronically Signed   By: Eddie Candle M.D.   On: 10/26/2020 14:52        Scheduled Meds:  acetaminophen  1,000 mg Oral TID   amLODipine  5 mg Oral Daily   clopidogrel  75 mg Oral Daily   dorzolamide-timolol  1 drop Right Eye BID   enoxaparin (LOVENOX) injection  0.5 mg/kg Subcutaneous Q24H   fluticasone furoate-vilanterol  1 puff Inhalation Daily   latanoprost  1 drop Both Eyes QHS   lidocaine  2 patch Transdermal Q24H   losartan  50 mg Oral BID   melatonin  5 mg Oral QHS   metoprolol succinate  100 mg Oral Daily   mupirocin ointment  1 application Nasal BID   pantoprazole  40 mg Oral Daily   rosuvastatin  10 mg Oral QHS   tamsulosin  0.4 mg Oral QPC breakfast   Continuous Infusions:   LOS: 0 days    Time spent: 15 minutes    Edwin Dada, MD  [facility] 10/27/2020, 1:02 PM     Please page though [place] or Epic secure chat:  For [employer], Adult nurse

## 2020-10-27 NOTE — Evaluation (Signed)
Physical Therapy Evaluation Patient Details Name: Chris Kent MRN: [id number] DOB: 02/13/1935 Today's Date: 10/27/2020  History of Present Illness  Pt is an 85 y/o M admitted from home via EMS on 10/26/20 with c/c of tripping & falling. Pt found to have Acute fracture of the anterior bridging osteophyte of T11 and T12-secondary to mechanical fall. PMH: BPH, HLD, HTN, GERD, CAD  Clinical Impression  Pt seen for PT evaluation with pt reporting significant back pain but agreeable to tx. PT educates pt on log rolling with pt requiring mod assist with use of bed rail for bed mobility. Pt is able to complete transfers & gait in room with RW & min assist. Pt requires extra time to complete all mobility tasks 2/2 pain. Pt reports his daughter can assist him at d/c but cannot provide 24/7 assist & is eager for more rehab. Pt would benefit from STR upon d/c to maximize independence with functional mobility & reduce fall risk prior to return home.         Recommendations for follow up therapy are one component of a multi-disciplinary discharge planning process, led by the attending physician.  Recommendations may be updated based on patient status, additional functional criteria and insurance authorization.  Follow Up Recommendations SNF    Equipment Recommendations  None recommended by PT    Recommendations for Other Services       Precautions / Restrictions Precautions Precautions: Fall;Back Restrictions Weight Bearing Restrictions: No      Mobility  Bed Mobility Overal bed mobility: Needs Assistance Bed Mobility: Rolling;Sidelying to Sit Rolling: Mod assist Sidelying to sit: Mod assist;Min assist;HOB elevated       General bed mobility comments: use of bed rails, cuing for log rolling, extra time to complete movement as pt is limited by pain    Transfers Overall transfer level: Needs assistance   Transfers: Sit to/from Stand Sit to Stand: Min assist;From elevated surface          General transfer comment: significantly elevated EOB, cuing for hand placement  Ambulation/Gait Ambulation/Gait assistance: Min assist;Min guard Gait Distance (Feet): 20 Feet Assistive device: Rolling walker (2 wheeled) Gait Pattern/deviations: Decreased step length - right;Decreased step length - left;Decreased stride length;Trunk flexed Gait velocity: decreased gait speed      Stairs            Wheelchair Mobility    Modified Rankin (Stroke Patients Only)       Balance Overall balance assessment: Needs assistance Sitting-balance support: Feet supported;Bilateral upper extremity supported Sitting balance-Leahy Scale: Fair     Standing balance support: Bilateral upper extremity supported;During functional activity Standing balance-Leahy Scale: Poor Standing balance comment: BUE support on RW                             Pertinent Vitals/Pain Pain Assessment: Faces Faces Pain Scale: Hurts whole lot Pain Location: back, it feels like I'm being "cut in two" Pain Descriptors / Indicators: Aching;Discomfort;Grimacing;Guarding Pain Intervention(s): Monitored during session;Limited activity within patient's tolerance;Patient requesting pain meds-RN notified    Home Living Family/patient expects to be discharged to:: Private residence Living Arrangements: Alone Available Help at Discharge: Family;Available PRN/intermittently Type of Home: House Home Access: Stairs to enter Entrance Stairs-Rails: None Entrance Stairs-Number of Steps: 1 Home Layout: One level        Prior Function Level of Independence: Independent         Comments: only this 1 fall in the  past 6 months, was going to weed-eat yard when this occurred, driving, living alone (wife passed away in 03-14-20)     Hand Dominance        Extremity/Trunk Assessment   Upper Extremity Assessment Upper Extremity Assessment: Generalized weakness    Lower Extremity  Assessment Lower Extremity Assessment: Generalized weakness    Cervical / Trunk Assessment Cervical / Trunk Assessment:  (pt with no cervical ROM, maintains neutral alignment, unable to rotate head L<>R, reports this is chronic)  Communication      Cognition Arousal/Alertness: Awake/alert Behavior During Therapy: WFL for tasks assessed/performed Overall Cognitive Status: Within Functional Limits for tasks assessed                                 General Comments: Pleasant      General Comments General comments (skin integrity, edema, etc.): Pt received on 1L/min via nasal cannula, SpO2 >90%, pt placed on room air & SpO2 reading as low as 81% on one finger but 89% on other, but then pt able to maintain >/= 90% on room air throughout remainder of session & pt left on room air.    Exercises     Assessment/Plan    PT Assessment Patient needs continued PT services  PT Problem List Decreased strength;Decreased mobility;Decreased knowledge of precautions;Decreased activity tolerance;Cardiopulmonary status limiting activity;Pain;Decreased knowledge of use of DME;Decreased balance       PT Treatment Interventions DME instruction;Therapeutic exercise;Gait training;Balance training;Stair training;Neuromuscular re-education;Functional mobility training;Therapeutic activities;Patient/family education;Modalities    PT Goals (Current goals can be found in the Care Plan section)  Acute Rehab PT Goals Patient Stated Goal: decreased pain, go to rehab PT Goal Formulation: With patient Time For Goal Achievement: 11/10/20 Potential to Achieve Goals: Good    Frequency 7X/week   Barriers to discharge Decreased caregiver support lives alone    Co-evaluation               AM-PAC PT "6 Clicks" Mobility  Outcome Measure Help needed turning from your back to your side while in a flat bed without using bedrails?: A Lot Help needed moving from lying on your back to sitting on  the side of a flat bed without using bedrails?: A Lot Help needed moving to and from a bed to a chair (including a wheelchair)?: A Little Help needed standing up from a chair using your arms (e.g., wheelchair or bedside chair)?: A Little Help needed to walk in hospital room?: A Little Help needed climbing 3-5 steps with a railing? : A Lot 6 Click Score: 15    End of Session Equipment Utilized During Treatment: Gait belt Activity Tolerance: Patient limited by pain;Patient tolerated treatment well Patient left: in chair;with chair alarm set;with call bell/phone within reach Nurse Communication: Mobility status (O2, pain) PT Visit Diagnosis: Muscle weakness (generalized) (M62.81);Pain;Difficulty in walking, not elsewhere classified (R26.2) Pain - part of body:  (back)    Time: 1040-1109 PT Time Calculation (min) (ACUTE ONLY): 29 min   Charges:   PT Evaluation $PT Eval Moderate Complexity: 1 Mod PT Treatments $Therapeutic Activity: 8-22 mins        Lavone Nian, PT, DPT 10/27/20, 1:52 PM   Waunita Schooner 10/27/2020, 1:50 PM

## 2020-10-27 NOTE — Plan of Care (Signed)
Patient alert and oriented x 4, complains of severe back pain status post fall at home. Pain relieved with prn pain medications. Vitals stable, no respiratory distress on 2 liters oxygen via nasal canula. Status update provided to daughter. Will continue to monitor.  Problem: Education: Goal: Knowledge of General Education information will improve Description: Including pain rating scale, medication(s)/side effects and non-pharmacologic comfort measures Outcome: Progressing   Problem: Health Behavior/Discharge Planning: Goal: Ability to manage health-related needs will improve Outcome: Progressing   Problem: Clinical Measurements: Goal: Ability to maintain clinical measurements within normal limits will improve Outcome: Progressing Goal: Will remain free from infection Outcome: Progressing Goal: Diagnostic test results will improve Outcome: Progressing Goal: Respiratory complications will improve Outcome: Progressing Goal: Cardiovascular complication will be avoided Outcome: Progressing   Problem: Nutrition: Goal: Adequate nutrition will be maintained Outcome: Progressing   Problem: Elimination: Goal: Will not experience complications related to bowel motility Outcome: Progressing Goal: Will not experience complications related to urinary retention Outcome: Progressing

## 2020-10-28 DIAGNOSIS — J4520 Mild intermittent asthma, uncomplicated: Secondary | ICD-10-CM | POA: Diagnosis not present

## 2020-10-28 DIAGNOSIS — F419 Anxiety disorder, unspecified: Secondary | ICD-10-CM | POA: Diagnosis not present

## 2020-10-28 DIAGNOSIS — W19XXXA Unspecified fall, initial encounter: Secondary | ICD-10-CM | POA: Diagnosis not present

## 2020-10-28 DIAGNOSIS — I10 Essential (primary) hypertension: Secondary | ICD-10-CM | POA: Diagnosis not present

## 2020-10-28 MED ORDER — NAPROXEN 250 MG PO TABS
250.0000 mg | ORAL_TABLET | Freq: Two times a day (BID) | ORAL | Status: DC | PRN
  Administered 2020-10-28 – 2020-10-30 (×3): 250 mg via ORAL
  Filled 2020-10-28 (×4): qty 1

## 2020-10-28 NOTE — Progress Notes (Signed)
Physical Therapy Treatment Patient Details Name: Chris Kent MRN: [id number] DOB: 03-06-1935 Today's Date: 10/28/2020   History of Present Illness Pt is an 85 y/o M admitted from home via EMS on 10/26/20 with c/c of tripping & falling. Pt found to have Acute fracture of the anterior bridging osteophyte of T11 and T12-secondary to mechanical fall. PMH: BPH, HLD, HTN, GERD, CAD    PT Comments    Pt seen for PT tx with pt requiring encouragement for OOB mobility. Pt continues to be limited by pain but requires most physical assistance for bed mobility. Pt is able to complete sit>stand transfers with min assist but with very modified environment (EOB significantly elevated). Pt is able to ambulate around bed to recliner with RW & supervision but requires 4 minutes to do so. Encouraged pt to sit in recliner as much as possible throughout the day. Continue to recommend STR upon d/c as pt is not safe to d/c home alone at this time & does not have 24 hr supervision at d/c.     Recommendations for follow up therapy are one component of a multi-disciplinary discharge planning process, led by the attending physician.  Recommendations may be updated based on patient status, additional functional criteria and insurance authorization.  Follow Up Recommendations  SNF     Equipment Recommendations  None recommended by PT    Recommendations for Other Services       Precautions / Restrictions Precautions Precautions: Fall;Back Restrictions Weight Bearing Restrictions: No     Mobility  Bed Mobility Overal bed mobility: Needs Assistance Bed Mobility: Rolling;Sidelying to Sit Rolling: Max assist Sidelying to sit: Min assist;HOB elevated       General bed mobility comments: max cuing for technique, assistance to reach over to hold to bed rail to participate in rolling, continues to be limited by no cervical ROM (cannot turn to locate bed rail)    Transfers Overall transfer level: Needs  assistance Equipment used: Rolling walker (2 wheeled) Transfers: Sit to/from Stand Sit to Stand: Min assist;From elevated surface         General transfer comment: cuing for hand placement, significantly elevated EOB, 2 attempts before success  Ambulation/Gait Ambulation/Gait assistance: Supervision Gait Distance (Feet): 13 Feet Assistive device: Rolling walker (2 wheeled) Gait Pattern/deviations: Decreased step length - right;Decreased step length - left;Decreased stride length;Trunk flexed Gait velocity: significantly decreased gait speed, requires 4 minutes to ambulate 13 ft       Stairs             Wheelchair Mobility    Modified Rankin (Stroke Patients Only)       Balance Overall balance assessment: Needs assistance Sitting-balance support: Feet supported;Bilateral upper extremity supported Sitting balance-Leahy Scale: Fair     Standing balance support: Bilateral upper extremity supported;During functional activity Standing balance-Leahy Scale: Fair Standing balance comment: BUE support on RW                            Cognition Arousal/Alertness: Awake/alert Behavior During Therapy: Flat affect Overall Cognitive Status: Within Functional Limits for tasks assessed                                        Exercises General Exercises - Lower Extremity Long Arc Quad: AROM;Strengthening;Both;10 reps;Seated Hip Flexion/Marching: AROM;Strengthening;Both;10 reps;Seated    General Comments General comments (skin integrity,  edema, etc.): Pt received on 2L/min & SPO2 >90%, pt placed on room air & after transferring to sitting EOB SPO2 dropped to 86% so placed back on 2L/min via nasal cannula, one reading of 88% but otherwise able to maintain >90% - nurse notified; Reviewed use & frequency of use of incentive spirometer with pt with good return demo      Pertinent Vitals/Pain Pain Assessment: Faces Faces Pain Scale: Hurts whole  lot Pain Location: back Pain Descriptors / Indicators: Aching;Discomfort;Grimacing;Guarding Pain Intervention(s): Monitored during session;Limited activity within patient's tolerance (rest breaks provided)    Home Living                      Prior Function            PT Goals (current goals can now be found in the care plan section) Acute Rehab PT Goals Patient Stated Goal: decreased pain, go to rehab PT Goal Formulation: With patient Time For Goal Achievement: 11/10/20 Potential to Achieve Goals: Good Progress towards PT goals: Progressing toward goals    Frequency    7X/week      PT Plan Current plan remains appropriate    Co-evaluation              AM-PAC PT "6 Clicks" Mobility   Outcome Measure  Help needed turning from your back to your side while in a flat bed without using bedrails?: Total Help needed moving from lying on your back to sitting on the side of a flat bed without using bedrails?: Total Help needed moving to and from a bed to a chair (including a wheelchair)?: A Little Help needed standing up from a chair using your arms (e.g., wheelchair or bedside chair)?: A Little Help needed to walk in hospital room?: A Little Help needed climbing 3-5 steps with a railing? : Total 6 Click Score: 12    End of Session Equipment Utilized During Treatment: Gait belt Activity Tolerance: Patient limited by pain Patient left: in chair;with chair alarm set Nurse Communication:  (O2) PT Visit Diagnosis: Muscle weakness (generalized) (M62.81);Pain;Difficulty in walking, not elsewhere classified (R26.2) Pain - part of body:  (back)     Time: 7972-8206 PT Time Calculation (min) (ACUTE ONLY): 23 min  Charges:  $Therapeutic Activity: 23-37 mins                     Lavone Nian, PT, DPT 10/28/20, 10:15 AM    Chris Kent 10/28/2020, 10:13 AM

## 2020-10-28 NOTE — TOC Initial Note (Signed)
Transition of Care [facility]) - Initial/Assessment Note    Patient Details  Name: Chris Kent MRN: [id number] Date of Birth: [birth year]  Transition of Care [facility]) CM/SW Contact:    Harriet Masson, RN Phone Number:  [phone] 10/28/2020, 3:40 PM  Clinical Narrative:                 Recommendation for SNF (PT only). RN spoke with pt today and inquired on possible choice of facility placement at SNF. Pt preference was [facility] but receptive to other facilities. Pt had a fall and now requires dressing changes and night time only home O2 in liters ([employer]). Pt lives alone with a supportive daughter Henriette Combs Tomasita Crumble) in [place]. Daughter provides all transportation and obtains pt's medications when prescribed at his local pharmacy.  Pt has a cane, rolling walker, shower chair, 3-1 commode, home O2 (1 lts) and a reacher.   TOC will continue to follow for any additional discharge recommendations.  Expected Discharge Plan: Skilled Nursing Facility Barriers to Discharge: Continued Medical Work up   Patient Goals and CMS Choice     Choice offered to / list presented to : Patient  Expected Discharge Plan and Services Expected Discharge Plan: [place]   Discharge Planning Services: CM Consult Post Acute Care Choice: [place] Living arrangements for the past 2 months: Single Family Home                                      Prior Living Arrangements/Services Living arrangements for the past 2 months: Single Family Home Lives with:: Self Patient language and need for interpreter reviewed:: Yes Do you feel safe going back to the place where you live?: Yes      Need for Family Participation in Patient Care: Yes (Comment) Care giver support system in place?: Yes (comment) Current home services: Home OT, Home PT Criminal Activity/Legal Involvement Pertinent to Current Situation/Hospitalization: No - Comment as needed  Activities of Daily  Living Home Assistive Devices/Equipment: Oxygen ADL Screening (condition at time of admission) Patient's cognitive ability adequate to safely complete daily activities?: Yes Is the patient deaf or have difficulty hearing?: No Does the patient have difficulty seeing, even when wearing glasses/contacts?: No Does the patient have difficulty concentrating, remembering, or making decisions?: No Patient able to express need for assistance with ADLs?: Yes Does the patient have difficulty dressing or bathing?: No Independently performs ADLs?: No Communication: Independent Dressing (OT): Needs assistance Is this a change from baseline?: Change from baseline, expected to last <3days Grooming: Independent Feeding: Independent Bathing: Needs assistance Is this a change from baseline?: Change from baseline, expected to last <3 days Toileting: Needs assistance Is this a change from baseline?: Change from baseline, expected to last <3 days In/Out Bed: Needs assistance Is this a change from baseline?: Change from baseline, expected to last <3 days Walks in Home: Independent Does the patient have difficulty walking or climbing stairs?: No Weakness of Legs: None Weakness of Arms/Hands: None  Permission Sought/Granted                  Emotional Assessment Appearance:: Appears stated age Attitude/Demeanor/Rapport: Engaged Affect (typically observed): Accepting Orientation: : Oriented to Self, Oriented to Place, Oriented to  Time, Oriented to Situation Alcohol / Substance Use: Not Applicable Psych Involvement: No (comment)  Admission diagnosis:  Trauma [T14.90XA] Fall [W19.XXXA] Patient Active Problem List  Diagnosis Date Noted   Fall 10/26/2020   Erectile dysfunction due to arterial insufficiency 06/29/2019   Nephrolithiasis 06/25/2018   Anxiety 11/21/2017   Asthma without status asthmaticus 11/21/2017   Esophagitis 11/21/2017   Glaucoma 11/21/2017   History of colonic polyps  11/21/2017   Hyperglycemia 11/21/2017   Hypertension 11/21/2017   Osteoarthritis 11/21/2017   Thyroid nodule 11/21/2017   Transient global amnesia 11/21/2017   Status post total bilateral knee replacement 01/12/2017   Essential hypertension, benign 03/05/2016   Hyperlipidemia 03/05/2016   Carotid stenosis 03/05/2016   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   Thrombocytopenia ([place]) 03/03/2014   Obstructive sleep apnea hypopnea, moderate 06/14/2013   Pulmonary nodules/lesions, multiple 06/14/2013   Renal cyst, acquired 04/06/2013   Enlarged prostate with lower urinary tract symptoms (LUTS) 03/18/2012   Erectile dysfunction 03/18/2012   Increased frequency of urination 03/18/2012   Nocturia 03/18/2012   PCP:  Idelle Crouch, MD Pharmacy:   [facility] [phone] [address], [place] [address] [place] [address] Phone: [phone] Fax: [phone]     Social Determinants of Health (SDOH) Interventions    Readmission Risk Interventions No flowsheet data found.

## 2020-10-28 NOTE — NC FL2 (Signed)
[place] LEVEL OF CARE SCREENING TOOL     IDENTIFICATION  Patient Name: Chris Kent Birthdate: 1935-12-05 Sex: male Admission Date (Current Location): 10/26/2020  [facility] and [place] Number:  Engineering geologist and Address:  [facility], [address], [place], [place] [address]      Provider Number: [id number]  Attending Physician Name and Address:  Edwin Dada, *  Relative Name and Phone Number:  June Tomasita Crumble [phone] (daughter)    Current Level of Care: SNF Recommended Level of Care: [place] Prior Approval Number:    Date Approved/Denied: 02/24/18 PASRR Number: [id number]  Discharge Plan: SNF    Current Diagnoses: Patient Active Problem List   Diagnosis Date Noted   Fall 10/26/2020   Erectile dysfunction due to arterial insufficiency 06/29/2019   Nephrolithiasis 06/25/2018   Anxiety 11/21/2017   Asthma without status asthmaticus 11/21/2017   Esophagitis 11/21/2017   Glaucoma 11/21/2017   History of colonic polyps 11/21/2017   Hyperglycemia 11/21/2017   Hypertension 11/21/2017   Osteoarthritis 11/21/2017   Thyroid nodule 11/21/2017   Transient global amnesia 11/21/2017   Status post total bilateral knee replacement 01/12/2017   Essential hypertension, benign 03/05/2016   Hyperlipidemia 03/05/2016   Carotid stenosis 03/05/2016   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   Thrombocytopenia ([place]) 03/03/2014   Obstructive sleep apnea hypopnea, moderate 06/14/2013   Pulmonary nodules/lesions, multiple 06/14/2013   Renal cyst, acquired 04/06/2013   Enlarged prostate with lower urinary tract symptoms (LUTS) 03/18/2012   Erectile dysfunction 03/18/2012   Increased frequency of urination 03/18/2012   Nocturia 03/18/2012    Orientation RESPIRATION BLADDER Height & Weight     Self, Time, Situation, Place  O2 (1 liter at night (home oxygen)) Continent Weight: 104.3 kg Height:  5\' 9"  (175.3 cm)   BEHAVIORAL SYMPTOMS/MOOD NEUROLOGICAL BOWEL NUTRITION STATUS      Continent Diet  AMBULATORY STATUS COMMUNICATION OF NEEDS Skin   Limited Assist Verbally Skin abrasions                       Personal Care Assistance Level of Assistance  Dressing, Bathing Bathing Assistance: Limited assistance   Dressing Assistance: Limited assistance     Functional Limitations Info             SPECIAL CARE FACTORS FREQUENCY  PT (By licensed PT)     PT Frequency: 5 X weekly              Contractures Contractures Info: Not present    Additional Factors Info                  Current Medications (10/28/2020):  This is the current hospital active medication list Current Facility-Administered Medications  Medication Dose Route Frequency Provider Last Rate Last Admin   acetaminophen (TYLENOL) tablet 1,000 mg  1,000 mg Oral TID Edwin Dada, MD   1,000 mg at 10/28/20 0913   albuterol (PROVENTIL) (2.5 MG/3ML) 0.083% nebulizer solution 3 mL  3 mL Nebulization Q6H PRN Cox, Amy N, DO       amLODipine (NORVASC) tablet 5 mg  5 mg Oral Daily Cox, Amy N, DO   5 mg at 10/28/20 0913   clopidogrel (PLAVIX) tablet 75 mg  75 mg Oral Daily Cox, Amy N, DO   75 mg at 10/28/20 0912   dorzolamide-timolol (COSOPT) 22.3-6.8 MG/ML ophthalmic solution 1 drop  1 drop Right Eye BID Sharion Settler, NP   1 drop at  10/28/20 0910   enoxaparin (LOVENOX) injection 52.5 mg  0.5 mg/kg Subcutaneous Q24H Cox, Amy N, DO   52.5 mg at 10/27/20 2216   fluticasone furoate-vilanterol (BREO ELLIPTA) 200-25 MCG/INH 1 puff  1 puff Inhalation Daily Cox, Amy N, DO   1 puff at 10/28/20 0911   latanoprost (XALATAN) 0.005 % ophthalmic solution 1 drop  1 drop Both Eyes QHS Sharion Settler, NP   1 drop at 10/27/20 2217   lidocaine (LIDODERM) 5 % 2 patch  2 patch Transdermal Q24H Cox, Amy N, DO   2 patch at 10/27/20 1958   losartan (COZAAR) tablet 50 mg  50 mg Oral BID Cox, Amy N, DO   50 mg at 10/28/20 0913    melatonin tablet 5 mg  5 mg Oral QHS Sharion Settler, NP   5 mg at 10/27/20 2214   metoprolol succinate (TOPROL-XL) 24 hr tablet 100 mg  100 mg Oral Daily Cox, Amy N, DO   100 mg at 10/28/20 0912   mupirocin ointment (BACTROBAN) 2 % 1 application  1 application Nasal BID Cox, Amy N, DO   1 application at 00/34/91 1054   naproxen (NAPROSYN) tablet 250 mg  250 mg Oral BID PRN Danford, Suann Larry, MD       ondansetron (ZOFRAN) tablet 4 mg  4 mg Oral Q6H PRN Cox, Amy N, DO       Or   ondansetron (ZOFRAN) injection 4 mg  4 mg Intravenous Q6H PRN Cox, Amy N, DO       oxyCODONE (Oxy IR/ROXICODONE) immediate release tablet 5 mg  5 mg Oral Q6H PRN Edwin Dada, MD   5 mg at 10/28/20 0913   pantoprazole (PROTONIX) EC tablet 40 mg  40 mg Oral Daily Cox, Amy N, DO   40 mg at 10/28/20 0913   rosuvastatin (CRESTOR) tablet 10 mg  10 mg Oral QHS Cox, Amy N, DO   10 mg at 10/27/20 2217   tamsulosin (FLOMAX) capsule 0.4 mg  0.4 mg Oral QPC breakfast Cox, Amy N, DO   0.4 mg at 10/28/20 7915     Discharge Medications: Please see discharge summary for a list of discharge medications.  Relevant Imaging Results:  Relevant Lab Results:   Additional Information SS# [id number]  Harriet Masson, RN

## 2020-10-28 NOTE — Progress Notes (Addendum)
[facility] PROGRESS NOTE    [name]  AST:[id number] DOB: 02/07/1935 DOA: 10/26/2020 PCP: Idelle Crouch, MD      Brief Narrative:  Chris Kent is a 85 y.o. M with HTN, obesity, who presented after a fall.  Fell and had flank and upper back pain. In the ER, CT imaging showed no rib or vertebral fractures only a small age indeterminate osteophyte chip.          Assessment & Plan:  Back pain Fall resulted in likely contusion, muscle strain.  The chipped osteophyte may be contributing to inflammation/pain but is not a structural bone has no physical limitations nor treatment necessary  - Continue acetaminophen  - May have PRN oxycodone, renal function allows PRN NSAIDs - PT eval   Hypertension BP controlled - Continue amlodipine, metoprolol, ARB  Atherosclerosis of coronary arteries and peripheral arteries, secondary prevention - Continue Plavix, Crestor, metoprolol, ARB  COPD - Continue ICS/LAMA  Glaucoma - Continue eyedrops  BPH - Continue Flomax  Obesity  BMI 34 - Continue PPI  Thrombocytopenia Stable - Follow-up with PCP  B12 and vitamin D deficiency - Recommend follow up with PCP             Disposition: Status is: Observation  The patient remains OBS appropriate and has no indication for admission, he is stable for discharge home when a safe disposition can be found by his family and Transitions of care team.  Dispo: The patient is from: Home              Anticipated d/c is to: Home              Patient currently is medically stable to d/c.   Difficult to place patient No       Level of care: Med-Surg       MDM: The below labs and imaging reports were reviewed and summarized above.  Medication management as above.    DVT prophylaxis: Place TED hose Start: 10/26/20 1904  Code Status: FULL           Subjective: Patient complains of back pain, no fever,  confusion.  Objective: Vitals:   10/27/20 2000 10/27/20 2341 10/28/20 0530 10/28/20 0751  BP: 125/65 112/62 115/61 119/64  Pulse: 61 (!) 59 (!) 57 (!) 57  Resp: 17 18  17   Temp: 98.1 F (36.7 C) 98.1 F (36.7 C) 97.7 F (36.5 C) 97.9 F (36.6 C)  TempSrc:   Oral   SpO2: 96% 94% 96% 96%  Weight:      Height:        Intake/Output Summary (Last 24 hours) at 10/28/2020 1216 Last data filed at 10/28/2020 1129 Gross per 24 hour  Intake 360 ml  Output 1000 ml  Net -640 ml   Filed Weights   10/26/20 1820  Weight: 104.3 kg    Examination: General appearance: Elderly adult male, lying in bed, no acute distress, interactive HEENT:    Skin:   Cardiac:    Respiratory:       Abdomen:     MSK: Normal muscle bulk and tone of the arms and legs.  Normal range of motion of the shoulders and elbows (given age).  He has no deformities of the large joints of the upper lower extremities bilaterally.  The patient refuses examination of the back due to pain. Neuro: Awake and alert, extraocular movements intact, face symmetric, memory seems normal, recall both short-term and long-term seems normal,  speech is fluent, upper extremity strength is 5 -/5 bilaterally, no sensory deficits reported. Psych: Sensorium intact, responding to questions, attention normal, affect appropriate, judgment and insight appear impaired    Data Reviewed: I have personally reviewed following labs and imaging studies:  CBC: Recent Labs  Lab 10/26/20 1307 10/27/20 0438  WBC 10.2 7.6  NEUTROABS 7.0  --   HGB 16.3 15.3  HCT 46.5 46.2  MCV 92.6 92.2  PLT 122* 124*   Basic Metabolic Panel: Recent Labs  Lab 10/26/20 1307 10/27/20 0438  NA 140 142  K 4.0 3.8  CL 110 108  CO2 25 27  GLUCOSE 175* 107*  BUN 18 18  CREATININE 1.20 0.71  CALCIUM 8.5* 8.3*   GFR: Estimated Creatinine Clearance: 83.2 mL/min (by C-G formula based on SCr of 0.71 mg/dL). Liver Function Tests: Recent Labs  Lab 10/26/20 1307   AST 18  ALT 15  ALKPHOS 61  BILITOT 1.1  PROT 6.2*  ALBUMIN 3.5   No results for input(s): LIPASE, AMYLASE in the last 168 hours. No results for input(s): AMMONIA in the last 168 hours. Coagulation Profile: No results for input(s): INR, PROTIME in the last 168 hours. Cardiac Enzymes: Recent Labs  Lab 10/26/20 1925  CKTOTAL 358   BNP (last 3 results) No results for input(s): PROBNP in the last 8760 hours. HbA1C: No results for input(s): HGBA1C in the last 72 hours. CBG: No results for input(s): GLUCAP in the last 168 hours. Lipid Profile: No results for input(s): CHOL, HDL, LDLCALC, TRIG, CHOLHDL, LDLDIRECT in the last 72 hours. Thyroid Function Tests: Recent Labs    10/26/20 1905  TSH 1.295   Anemia Panel: Recent Labs    10/26/20 1906  VITAMINB12 156*   Urine analysis:    Component Value Date/Time   COLORURINE YELLOW (A) 10/26/2020 1915   APPEARANCEUR CLEAR (A) 10/26/2020 1915   APPEARANCEUR Clear 11/21/2017 1011   LABSPEC 1.035 (H) 10/26/2020 1915   LABSPEC 1.013 09/20/2012 0122   PHURINE 6.0 10/26/2020 1915   GLUCOSEU NEGATIVE 10/26/2020 1915   GLUCOSEU Negative 09/20/2012 0122   HGBUR MODERATE (A) 10/26/2020 1915   BILIRUBINUR NEGATIVE 10/26/2020 1915   BILIRUBINUR Negative 11/21/2017 1011   BILIRUBINUR Negative 09/20/2012 0122   KETONESUR NEGATIVE 10/26/2020 1915   PROTEINUR 30 (A) 10/26/2020 1915   NITRITE NEGATIVE 10/26/2020 1915   LEUKOCYTESUR NEGATIVE 10/26/2020 1915   LEUKOCYTESUR Negative 09/20/2012 0122   Sepsis Labs: @LABRCNTIP (procalcitonin:4,lacticacidven:4)  ) Recent Results (from the past 240 hour(s))  Resp Panel by RT-PCR (Flu A&B, Covid) Nasopharyngeal Swab     Status: None   Collection Time: 10/26/20  5:23 PM   Specimen: Nasopharyngeal Swab; Nasopharyngeal(NP) swabs in vial transport medium  Result Value Ref Range Status   SARS Coronavirus 2 by RT PCR NEGATIVE NEGATIVE Final    Comment: (NOTE) SARS-CoV-2 target nucleic acids  are NOT DETECTED.  The SARS-CoV-2 RNA is generally detectable in upper respiratory specimens during the acute phase of infection. The lowest concentration of SARS-CoV-2 viral copies this assay can detect is 138 copies/mL. A negative result does not preclude SARS-Cov-2 infection and should not be used as the sole basis for treatment or other patient management decisions. A negative result may occur with  improper specimen collection/handling, submission of specimen other than nasopharyngeal swab, presence of viral mutation(s) within the areas targeted by this assay, and inadequate number of viral copies(<138 copies/mL). A negative result must be combined with clinical observations, patient history, and epidemiological information. The expected  result is Negative.  Fact Sheet for Patients:  EntrepreneurPulse.com.au  Fact Sheet for Healthcare Providers:  IncredibleEmployment.be  This test is no t yet approved or cleared by the [place] FDA and  has been authorized for detection and/or diagnosis of SARS-CoV-2 by FDA under an Emergency Use Authorization (EUA). This EUA will remain  in effect (meaning this test can be used) for the duration of the COVID-19 declaration under Section 564(b)(1) of the Act, 21 U.S.C.section 360bbb-3(b)(1), unless the authorization is terminated  or revoked sooner.       Influenza A by PCR NEGATIVE NEGATIVE Final   Influenza B by PCR NEGATIVE NEGATIVE Final    Comment: (NOTE) The Xpert Xpress SARS-CoV-2/FLU/RSV plus assay is intended as an aid in the diagnosis of influenza from Nasopharyngeal swab specimens and should not be used as a sole basis for treatment. Nasal washings and aspirates are unacceptable for Xpert Xpress SARS-CoV-2/FLU/RSV testing.  Fact Sheet for Patients: EntrepreneurPulse.com.au  Fact Sheet for Healthcare Providers: IncredibleEmployment.be  This test is  not yet approved or cleared by the [place] FDA and has been authorized for detection and/or diagnosis of SARS-CoV-2 by FDA under an Emergency Use Authorization (EUA). This EUA will remain in effect (meaning this test can be used) for the duration of the COVID-19 declaration under Section 564(b)(1) of the Act, 21 U.S.C. section 360bbb-3(b)(1), unless the authorization is terminated or revoked.  Performed at [facility], [address], [place], [place] [address]   Surgical PCR screen     Status: None   Collection Time: 10/26/20 10:17 PM   Specimen: Nasal Mucosa; Nasal Swab  Result Value Ref Range Status   MRSA, PCR NEGATIVE NEGATIVE Final   Staphylococcus aureus NEGATIVE NEGATIVE Final    Comment: (NOTE) The Xpert SA Assay (FDA approved for NASAL specimens in patients 57 years of age and older), is one component of a comprehensive surveillance program. It is not intended to diagnose infection nor to guide or monitor treatment. Performed at [facility], [address], [place], [place] [address]          Radiology Studies: CT HEAD WO CONTRAST (5MM)  Result Date: 10/26/2020 CLINICAL DATA:  Cerebral hemorrhage suspected Patient reports fall this morning. EXAM: CT HEAD WITHOUT CONTRAST TECHNIQUE: Contiguous axial images were obtained from the base of the skull through the vertex without intravenous contrast. COMPARISON:  Head CT 02/20/2018 FINDINGS: Brain: Stable degree of atrophy and chronic small vessel ischemia from prior exam. No intracranial hemorrhage, mass effect, or midline shift. No hydrocephalus. The basilar cisterns are patent. No evidence of territorial infarct or acute ischemia. No extra-axial or intracranial fluid collection. Vascular: Atherosclerosis of skullbase vasculature without hyperdense vessel or abnormal calcification. Skull: No fracture or focal lesion. Sinuses/Orbits: Small mucous retention cyst left maxillary sinus no acute findings.  No mastoid effusion. Other: None. IMPRESSION: 1. No acute intracranial abnormality. No skull fracture. 2. Stable atrophy and chronic small vessel ischemia. Electronically Signed   By: Keith Rake M.D.   On: 10/26/2020 18:23   CT Cervical Spine Wo Contrast  Result Date: 10/26/2020 CLINICAL DATA:  Neck trauma (Age >= 65y) Fall this morning. EXAM: CT CERVICAL SPINE WITHOUT CONTRAST TECHNIQUE: Multidetector CT imaging of the cervical spine was performed without intravenous contrast. Multiplanar CT image reconstructions were also generated. COMPARISON:  Cervical spine CT 02/20/2018 FINDINGS: Alignment: No traumatic subluxation. Skull base and vertebrae: Interval posterior rod with intrapedicular screw fusion from C4 through T2 fixating prior C7 fracture. Intact  hardware. Previous fracture has healed. There is no new or acute fracture. Bulky flowing anterior osteophytes throughout the cervical spine are not significantly changed. Chronic degenerative change at C1-C2 with partial bony ankylosis, also stable. Intact dens and skull base. Soft tissues and spinal canal: Partially obscured by streak artifact from spinal fusion hardware. There is no prevertebral soft tissue edema. Disc levels: Large flowing anterior osteophytes. No high-grade canal stenosis. Upper chest: Assessed on earlier chest CT, reported separately. No acute apical findings. Other: None. IMPRESSION: 1. No acute fracture or subluxation of the cervical spine. 2. Interval posterior rod with intrapedicular screw fusion from C4 through T2 fixating prior C7 fracture. Previous fracture has healed. Intact hardware. 3. Large flowing anterior osteophytes throughout the cervical spine. Electronically Signed   By: Keith Rake M.D.   On: 10/26/2020 18:28   CT CHEST ABDOMEN PELVIS W CONTRAST  Result Date: 10/26/2020 CLINICAL DATA:  Fall from ramp, pain EXAM: CT CHEST, ABDOMEN, AND PELVIS WITH CONTRAST TECHNIQUE: Multidetector CT imaging of the chest,  abdomen and pelvis was performed following the standard protocol during bolus administration of intravenous contrast. CONTRAST:  33mL OMNIPAQUE IOHEXOL 350 MG/ML SOLN COMPARISON:  07/16/2017 FINDINGS: CT CHEST FINDINGS Cardiovascular: Aortic atherosclerosis. Cardiomegaly. Left coronary artery calcifications. Trace pericardial effusion. Mediastinum/Nodes: No enlarged mediastinal, hilar, or axillary lymph nodes. Thyroid gland, trachea, and esophagus demonstrate no significant findings. Lungs/Pleura: Diffuse bilateral bronchial wall thickening with dependent bibasilar scarring and or atelectasis. Multiple small pulmonary nodules are present bilaterally, stable compared to prior examination dated 07/16/2017 and benign, for example a 5 mm nodule of the right middle lobe (series 3, image 74). No pleural effusion or pneumothorax. Musculoskeletal: No chest wall mass or suspicious bone lesions identified. 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 There is adjacent perivertebral fat stranding (series 2, image 54). CT ABDOMEN PELVIS FINDINGS Hepatobiliary: No focal liver abnormality is seen. Status post cholecystectomy. No biliary dilatation. Pancreas: Unremarkable. No pancreatic ductal dilatation or surrounding inflammatory changes. Spleen: Normal in size without significant abnormality. Adrenals/Urinary Tract: Adrenal glands are unremarkable. Kidneys are normal, without renal calculi, solid lesion, or hydronephrosis. Bladder is unremarkable. Stomach/Bowel: Stomach is within normal limits. Appendix appears normal. No evidence of bowel wall thickening, distention, or inflammatory changes. Vascular/Lymphatic: Aortic atherosclerosis. No enlarged abdominal or pelvic lymph nodes. Reproductive: Prostatomegaly. Other: Small, fat containing bilateral inguinal hernias. No abdominopelvic ascites. Musculoskeletal: No acute  or significant osseous findings. IMPRESSION: 1. No CT evidence of acute traumatic injury to the organs of the chest, abdomen, or pelvis. 2. 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 There is adjacent perivertebral fat stranding. No obvious epidural hematoma. 3. Multiple small pulmonary nodules are stable and definitively benign. 4. Prostatomegaly. 5. Coronary artery disease. Aortic Atherosclerosis (ICD10-I70.0). Electronically Signed   By: Eddie Candle M.D.   On: 10/26/2020 14:52        Scheduled Meds:  acetaminophen  1,000 mg Oral TID   amLODipine  5 mg Oral Daily   clopidogrel  75 mg Oral Daily   dorzolamide-timolol  1 drop Right Eye BID   enoxaparin (LOVENOX) injection  0.5 mg/kg Subcutaneous Q24H   fluticasone furoate-vilanterol  1 puff Inhalation Daily   latanoprost  1 drop Both Eyes QHS   lidocaine  2 patch Transdermal Q24H   losartan  50 mg Oral BID   melatonin  5 mg Oral QHS   metoprolol succinate  100 mg Oral Daily   mupirocin ointment  1 application Nasal BID   pantoprazole  40 mg Oral Daily   rosuvastatin  10 mg Oral QHS   tamsulosin  0.4 mg Oral QPC breakfast   Continuous Infusions:   LOS: 0 days    Time spent: 15 minutes    Edwin Dada, MD [facility] 10/28/2020, 12:16 PM     Please page though Many or Epic secure chat:  For [employer], Adult nurse

## 2020-10-29 DIAGNOSIS — M549 Dorsalgia, unspecified: Secondary | ICD-10-CM | POA: Diagnosis not present

## 2020-10-29 DIAGNOSIS — W19XXXA Unspecified fall, initial encounter: Secondary | ICD-10-CM | POA: Diagnosis not present

## 2020-10-29 DIAGNOSIS — R338 Other retention of urine: Secondary | ICD-10-CM | POA: Diagnosis not present

## 2020-10-29 DIAGNOSIS — N401 Enlarged prostate with lower urinary tract symptoms: Secondary | ICD-10-CM | POA: Diagnosis not present

## 2020-10-29 DIAGNOSIS — E538 Deficiency of other specified B group vitamins: Secondary | ICD-10-CM | POA: Diagnosis not present

## 2020-10-29 DIAGNOSIS — I10 Essential (primary) hypertension: Secondary | ICD-10-CM | POA: Diagnosis not present

## 2020-10-29 MED ORDER — TAMSULOSIN HCL 0.4 MG PO CAPS: 0.4000 mg | ORAL_CAPSULE | Freq: Every day | ORAL | Status: DC

## 2020-10-29 MED ORDER — SODIUM CHLORIDE 0.45 % IV SOLN: INTRAVENOUS | Status: AC

## 2020-10-29 MED ORDER — SENNOSIDES-DOCUSATE SODIUM 8.6-50 MG PO TABS
2.0000 | ORAL_TABLET | Freq: Every day | ORAL | Status: DC
  Administered 2020-10-29 – 2020-10-31 (×2): 2 via ORAL
  Filled 2020-10-29 (×3): qty 2

## 2020-10-29 MED ORDER — VITAMIN B-12 1000 MCG PO TABS
1000.0000 ug | ORAL_TABLET | Freq: Every day | ORAL | Status: DC
  Administered 2020-10-29 – 2020-11-01 (×4): 1000 ug via ORAL
  Filled 2020-10-29 (×4): qty 1

## 2020-10-29 MED ORDER — VITAMIN D (ERGOCALCIFEROL) 1.25 MG (50000 UNIT) PO CAPS
50000.0000 [IU] | ORAL_CAPSULE | ORAL | Status: DC
  Filled 2020-10-29: qty 1

## 2020-10-29 MED ORDER — BISACODYL 10 MG RE SUPP
10.0000 mg | Freq: Every day | RECTAL | Status: DC | PRN
  Administered 2020-10-30: 10 mg via RECTAL
  Filled 2020-10-29: qty 1

## 2020-10-29 MED ORDER — POLYETHYLENE GLYCOL 3350 17 G PO PACK
17.0000 g | PACK | Freq: Every day | ORAL | Status: DC
  Administered 2020-10-29 – 2020-11-01 (×3): 17 g via ORAL
  Filled 2020-10-29 (×4): qty 1

## 2020-10-29 NOTE — Progress Notes (Addendum)
[facility] PROGRESS NOTE    Chris Kent  FKC:[id number] DOB: Jan 02, 1936 DOA: 10/26/2020 PCP: Idelle Crouch, MD    Brief Narrative:  Chris Kent is a 85 y.o. M with HTN, obesity, who presented after a fall. Had flank and upper back pain. In the ER, CT imaging showed no rib or vertebral fractures only a small age indeterminate osteophyte chip.    Consultants: None  Procedures: None yet   Assessment & Plan:  Back pain Fall resulted in likely contusion, muscle strain.  The chipped osteophyte may be contributing to inflammation/pain but is not a structural bone has no physical limitations nor treatment necessary Pain is reasonably well controlled as long as he is resting.  Experiences worsening pain with movement.  Continue current pain regimen.  PT and OT evaluation.  Essential hypertension Blood pressure is reasonably well controlled. Continue amlodipine, metoprolol, ARB.  Urine noted to be dark in color.  Could be mildly dehydrated.  We will gently hydrate him for a few hours.  Atherosclerosis of coronary arteries and peripheral arteries, secondary prevention Continue Plavix, Crestor, metoprolol, ARB  COPD Continue inhalers.  Stable.  Uses oxygen mainly at nighttime at home.  Glaucoma Continue eyedrops  BPH Continue Flomax  Obesity  Estimated body mass index is 33.97 kg/m as calculated from the following:   Height as of this encounter: 5\' 9"  (1.753 m).   Weight as of this encounter: 104.3 kg.  Thrombocytopenia Stable. Follow-up with PCP  B12 and vitamin D deficiency Will supplement.  Levels to be rechecked in a few weeks.   Disposition: Status is: Observation  The patient remains OBS appropriate and has no indication for admission, he is stable for discharge home when a safe disposition can be found by his family and Transitions of care team.  Dispo: The patient is from: Home              Anticipated d/c is to: Home               Patient currently is medically stable to d/c.   Difficult to place patient No   DVT prophylaxis: Lovenox  Code Status: FULL      Subjective: Feels well.  Denies any pain currently.  No shortness of breath.  No nausea or vomiting   Objective: Vitals:   10/28/20 1940 10/29/20 0118 10/29/20 0443 10/29/20 0801  BP: 130/61 105/61 127/67 126/62  Pulse: (!) 58 (!) 53 (!) 57 (!) 55  Resp: 17 17 16 16   Temp: 98 F (36.7 C) 97.8 F (36.6 C) 98.2 F (36.8 C) 98.1 F (36.7 C)  TempSrc:  Oral    SpO2: 99% 95% 95% 94%  Weight:      Height:        Intake/Output Summary (Last 24 hours) at 10/29/2020 1025 Last data filed at 10/29/2020 1009 Gross per 24 hour  Intake 120 ml  Output 600 ml  Net -480 ml    Filed Weights   10/26/20 1820  Weight: 104.3 kg    Examination:  General appearance: Awake alert.  In no distress Resp: Clear to auscultation bilaterally.  Normal effort Cardio: S1-S2 is normal regular.  No S3-S4.  No rubs murmurs or bruit GI: Abdomen is soft.  Nontender nondistended.  Bowel sounds are present normal.  No masses organomegaly Extremities: Able to move all of his extremities.  Some limitation is noted in the lower extremities but no focal weakness Neurologic: Alert and oriented x3.  No  focal neurological deficits.      Data Reviewed: I have personally reviewed following labs and imaging studies:  CBC: Recent Labs  Lab 10/26/20 1307 10/27/20 0438  WBC 10.2 7.6  NEUTROABS 7.0  --   HGB 16.3 15.3  HCT 46.5 46.2  MCV 92.6 92.2  PLT 122* 100*    Basic Metabolic Panel: Recent Labs  Lab 10/26/20 1307 10/27/20 0438  NA 140 142  K 4.0 3.8  CL 110 108  CO2 25 27  GLUCOSE 175* 107*  BUN 18 18  CREATININE 1.20 0.71  CALCIUM 8.5* 8.3*    GFR: Estimated Creatinine Clearance: 83.2 mL/min (by C-G formula based on SCr of 0.71 mg/dL). Liver Function Tests: Recent Labs  Lab 10/26/20 1307  AST 18  ALT 15  ALKPHOS 61  BILITOT 1.1  PROT 6.2*   ALBUMIN 3.5     Cardiac Enzymes: Recent Labs  Lab 10/26/20 1925  CKTOTAL 358     Thyroid Function Tests: Recent Labs    10/26/20 1905  TSH 1.295    Anemia Panel: Recent Labs    10/26/20 1906  VITAMINB12 156*    Recent Results (from the past 240 hour(s))  Resp Panel by RT-PCR (Flu A&B, Covid) Nasopharyngeal Swab     Status: None   Collection Time: 10/26/20  5:23 PM   Specimen: Nasopharyngeal Swab; Nasopharyngeal(NP) swabs in vial transport medium  Result Value Ref Range Status   SARS Coronavirus 2 by RT PCR NEGATIVE NEGATIVE Final    Comment: (NOTE) SARS-CoV-2 target nucleic acids are NOT DETECTED.  The SARS-CoV-2 RNA is generally detectable in upper respiratory specimens during the acute phase of infection. The lowest concentration of SARS-CoV-2 viral copies this assay can detect is 138 copies/mL. A negative result does not preclude SARS-Cov-2 infection and should not be used as the sole basis for treatment or other patient management decisions. A negative result may occur with  improper specimen collection/handling, submission of specimen other than nasopharyngeal swab, presence of viral mutation(s) within the areas targeted by this assay, and inadequate number of viral copies(<138 copies/mL). A negative result must be combined with clinical observations, patient history, and epidemiological information. The expected result is Negative.  Fact Sheet for Patients:  EntrepreneurPulse.com.au  Fact Sheet for Healthcare Providers:  IncredibleEmployment.be  This test is no t yet approved or cleared by the [place] FDA and  has been authorized for detection and/or diagnosis of SARS-CoV-2 by FDA under an Emergency Use Authorization (EUA). This EUA will remain  in effect (meaning this test can be used) for the duration of the COVID-19 declaration under Section 564(b)(1) of the Act, 21 U.S.C.section 360bbb-3(b)(1), unless the  authorization is terminated  or revoked sooner.       Influenza A by PCR NEGATIVE NEGATIVE Final   Influenza B by PCR NEGATIVE NEGATIVE Final    Comment: (NOTE) The Xpert Xpress SARS-CoV-2/FLU/RSV plus assay is intended as an aid in the diagnosis of influenza from Nasopharyngeal swab specimens and should not be used as a sole basis for treatment. Nasal washings and aspirates are unacceptable for Xpert Xpress SARS-CoV-2/FLU/RSV testing.  Fact Sheet for Patients: EntrepreneurPulse.com.au  Fact Sheet for Healthcare Providers: IncredibleEmployment.be  This test is not yet approved or cleared by the [place] FDA and has been authorized for detection and/or diagnosis of SARS-CoV-2 by FDA under an Emergency Use Authorization (EUA). This EUA will remain in effect (meaning this test can be used) for the duration of the COVID-19 declaration under Section  564(b)(1) of the Act, 21 U.S.C. section 360bbb-3(b)(1), unless the authorization is terminated or revoked.  Performed at [facility], [address], [place], [place] [address]   Surgical PCR screen     Status: None   Collection Time: 10/26/20 10:17 PM   Specimen: Nasal Mucosa; Nasal Swab  Result Value Ref Range Status   MRSA, PCR NEGATIVE NEGATIVE Final   Staphylococcus aureus NEGATIVE NEGATIVE Final    Comment: (NOTE) The Xpert SA Assay (FDA approved for NASAL specimens in patients 68 years of age and older), is one component of a comprehensive surveillance program. It is not intended to diagnose infection nor to guide or monitor treatment. Performed at [facility], [address] [place], [place] [address]          Radiology Studies: No results found.      Scheduled Meds:  acetaminophen  1,000 mg Oral TID   amLODipine  5 mg Oral Daily   clopidogrel  75 mg Oral Daily   dorzolamide-timolol  1 drop Right Eye BID   enoxaparin (LOVENOX) injection  0.5  mg/kg Subcutaneous Q24H   fluticasone furoate-vilanterol  1 puff Inhalation Daily   latanoprost  1 drop Both Eyes QHS   lidocaine  2 patch Transdermal Q24H   losartan  50 mg Oral BID   melatonin  5 mg Oral QHS   metoprolol succinate  100 mg Oral Daily   mupirocin ointment  1 application Nasal BID   pantoprazole  40 mg Oral Daily   polyethylene glycol  17 g Oral Daily   rosuvastatin  10 mg Oral QHS   senna-docusate  2 tablet Oral QHS   tamsulosin  0.4 mg Oral QPC breakfast   vitamin B-12  1,000 mcg Oral Daily   Vitamin D (Ergocalciferol)  50,000 Units Oral Q7 days   Continuous Infusions:  sodium chloride 75 mL/hr at 10/29/20 0859     LOS: 0 days     Bonnielee Haff, MD [facility] 10/29/2020, 10:25 AM     Please page though AMION or Epic secure chat:  For [employer], Adult nurse

## 2020-10-29 NOTE — Progress Notes (Signed)
Physical Therapy Treatment Patient Details Name: Chris Kent MRN: [id number] DOB: Jan 08, 1936 Today's Date: 10/29/2020   History of Present Illness Pt is an 85 y/o M admitted from home via EMS on 10/26/20 with c/c of tripping & falling. Pt found to have Acute fracture of the anterior bridging osteophyte of T11 and T12-secondary to mechanical fall. PMH: BPH, HLD, HTN, GERD, CAD    PT Comments    Pt seen for PT tx with pt still lying in bed. PT educated pt on need for OOB mobility to recliner/BSC throughout the day. Pt demonstrates improvement on this date as he is able to complete bed mobility, sit<>stand, and ambulate in room with RW & supervision overall. Pt is deconditioned & unable to tolerate much physical activity as he required rest break after short distance gait in room. Pt has significant pain & requires significantly extra time to complete all mobility & is still unsafe to d/c home alone. Continue to recommend STR upon d/c to maximize independence with functional mobility & reduce fall risk prior to return home.    Recommendations for follow up therapy are one component of a multi-disciplinary discharge planning process, led by the attending physician.  Recommendations may be updated based on patient status, additional functional criteria and insurance authorization.  Follow Up Recommendations  SNF     Equipment Recommendations  Rolling walker with 5" wheels;3in1 (PT)    Recommendations for Other Services       Precautions / Restrictions Precautions Precautions: Fall;Back Restrictions Weight Bearing Restrictions: No     Mobility  Bed Mobility Overal bed mobility: Needs Assistance Bed Mobility: Rolling;Sidelying to Sit Rolling: Supervision Sidelying to sit: Supervision       General bed mobility comments: use of bed rails, extra time    Transfers Overall transfer level: Needs assistance Equipment used: Rolling walker (2 wheeled) Transfers: Sit to/from  Stand Sit to Stand: Supervision         General transfer comment: 2 attempts for successful sit>stand from EOB  Ambulation/Gait Ambulation/Gait assistance: Supervision Gait Distance (Feet): 20 Feet Assistive device: Rolling walker (2 wheeled) Gait Pattern/deviations: Decreased step length - right;Decreased step length - left;Decreased dorsiflexion - right;Decreased dorsiflexion - left;Decreased stride length;Trunk flexed Gait velocity: significantly decreased gait speed       Stairs             Wheelchair Mobility    Modified Rankin (Stroke Patients Only)       Balance Overall balance assessment: Needs assistance Sitting-balance support: Feet supported;Bilateral upper extremity supported Sitting balance-Leahy Scale: Good     Standing balance support: Bilateral upper extremity supported;During functional activity Standing balance-Leahy Scale: Fair Standing balance comment: BUE support on RW                            Cognition Arousal/Alertness: Awake/alert Behavior During Therapy: Flat affect Overall Cognitive Status: Within Functional Limits for tasks assessed                                 General Comments: Pleasant      Exercises      General Comments General comments (skin integrity, edema, etc.): Pt on 2L/min via nasal cannula, SpO2 >90% throughout session      Pertinent Vitals/Pain Pain Assessment: Faces Faces Pain Scale: Hurts whole lot Pain Location: back, "feels like a knife sticking me" Pain Descriptors / Indicators:  Grimacing;Discomfort;Sharp Pain Intervention(s): Monitored during session;Limited activity within patient's tolerance    Home Living                      Prior Function            PT Goals (current goals can now be found in the care plan section) Acute Rehab PT Goals Patient Stated Goal: decreased pain, go to rehab PT Goal Formulation: With patient Time For Goal Achievement:  11/10/20 Potential to Achieve Goals: Good Progress towards PT goals: Progressing toward goals    Frequency    7X/week      PT Plan Current plan remains appropriate    Co-evaluation              AM-PAC PT "6 Clicks" Mobility   Outcome Measure  Help needed turning from your back to your side while in a flat bed without using bedrails?: A Little Help needed moving from lying on your back to sitting on the side of a flat bed without using bedrails?: A Little Help needed moving to and from a bed to a chair (including a wheelchair)?: A Little Help needed standing up from a chair using your arms (e.g., wheelchair or bedside chair)?: A Little Help needed to walk in hospital room?: A Little Help needed climbing 3-5 steps with a railing? : A Lot 6 Click Score: 17    End of Session Equipment Utilized During Treatment: Gait belt;Oxygen Activity Tolerance: Patient tolerated treatment well;Patient limited by pain Patient left: in chair;with call bell/phone within reach Nurse Communication: Mobility status PT Visit Diagnosis: Muscle weakness (generalized) (M62.81);Pain;Difficulty in walking, not elsewhere classified (R26.2) Pain - part of body:  (back)     Time: 3086-5784 PT Time Calculation (min) (ACUTE ONLY): 16 min  Charges:  $Therapeutic Activity: 8-22 mins                     Lavone Nian, PT, DPT 10/29/20, 12:30 PM    Waunita Schooner 10/29/2020, 12:28 PM

## 2020-10-29 NOTE — Progress Notes (Signed)
Patient alert and oriented x 4, complains of severe back pain status post fall at home. Pain relieved with prn pain medications. Vitals stable, no respiratory distress on 2 liters oxygen via nasal canula. No adverse events during shift. Will continue to monitor.

## 2020-10-29 NOTE — TOC Progression Note (Signed)
Transition of Care [facility]) - Progression Note    Patient Details  Name: Chris Kent MRN: [id number] Date of Birth: 1935/03/13  Transition of Care [facility]) CM/SW [place], RN Phone Number: 10/29/2020, 10:14 AM  Clinical Narrative:    Sent out Bedsearch to all area facilities thru [employer], The patient prefers [facility], [facility] CM left a Secure VM for [employer] at [facility] requesting a bed   Expected Discharge Plan: [place] Barriers to Discharge: Continued Medical Work up  Expected Discharge Plan and Services Expected Discharge Plan: [place]   Discharge Planning Services: CM Consult Post Acute Care Choice: [place] Living arrangements for the past 2 months: Single Family Home                                       Social Determinants of Health (SDOH) Interventions    Readmission Risk Interventions No flowsheet data found.

## 2020-10-30 DIAGNOSIS — E538 Deficiency of other specified B group vitamins: Secondary | ICD-10-CM

## 2020-10-30 DIAGNOSIS — I10 Essential (primary) hypertension: Secondary | ICD-10-CM | POA: Diagnosis not present

## 2020-10-30 DIAGNOSIS — R338 Other retention of urine: Secondary | ICD-10-CM

## 2020-10-30 DIAGNOSIS — N401 Enlarged prostate with lower urinary tract symptoms: Secondary | ICD-10-CM

## 2020-10-30 DIAGNOSIS — W19XXXA Unspecified fall, initial encounter: Secondary | ICD-10-CM | POA: Diagnosis not present

## 2020-10-30 DIAGNOSIS — M549 Dorsalgia, unspecified: Secondary | ICD-10-CM | POA: Diagnosis not present

## 2020-10-30 MED ORDER — TAMSULOSIN HCL 0.4 MG PO CAPS
0.8000 mg | ORAL_CAPSULE | Freq: Every day | ORAL | Status: DC
  Administered 2020-10-30 – 2020-10-31 (×2): 0.8 mg via ORAL
  Filled 2020-10-30 (×2): qty 2

## 2020-10-30 MED ORDER — POTASSIUM CHLORIDE CRYS ER 20 MEQ PO TBCR
40.0000 meq | EXTENDED_RELEASE_TABLET | Freq: Once | ORAL | Status: AC
  Administered 2020-10-30: 40 meq via ORAL
  Filled 2020-10-30: qty 2

## 2020-10-30 NOTE — TOC Progression Note (Signed)
Transition of Care [facility]) - Progression Note    Patient Details  Name: ROCKFORD LEINEN MRN: [id number] Date of Birth: 29-May-1935  Transition [facility]) CM/SW Contact  Su Hilt, RN Phone Number: 10/30/2020, 10:56 AM  Clinical Narrative:   Reached out to all area facilities and requested a bed, awaiting bed offers to review    Expected Discharge Plan: [place] Barriers to Discharge: Continued Medical Work up  Expected Discharge Plan and Services Expected Discharge Plan: [place]   Discharge Planning Services: CM Consult Post Acute Care Choice: [place] arrangements for the past 2 months: Single Family Home                                       Social Determinants of Health (SDOH) Interventions    Readmission Risk Interventions No flowsheet data found.

## 2020-10-30 NOTE — TOC Progression Note (Signed)
Transition of [facility]) - Progression Note    Patient Details  Name: Chris Kent MRN: [id number] Date of Birth: 10-16-1935  Transition of Care [facility]) CM/SW Contact  Su Hilt, RN Phone Number: 10/30/2020, 2:01 PM  Clinical Narrative:   Spoke with the patient and reviewed the bed offers, he chose [facility], I called and notified his daughter and called [employer] and requested auth approval    Expected Discharge Plan: [place] Barriers to Discharge: Continued Medical Work up  Expected Discharge Plan and Services Expected Discharge Plan: [place]   Discharge Planning Services: CM Consult Post Acute Care Choice: [place] Living arrangements for the past 2 months: Single Family Home                                       Social Determinants of Health (SDOH) Interventions    Readmission Risk Interventions No flowsheet data found.

## 2020-10-30 NOTE — Progress Notes (Signed)
[facility] PROGRESS NOTE    Chris Kent  YTK:[id number] DOB: 06-14-1935 DOA: 10/26/2020 PCP: Chris Crouch, MD    Brief Narrative:  Chris Kent is a 85 y.o. M with HTN, obesity, who presented after a fall. Had flank and upper back pain. In the ER, CT imaging showed no rib or vertebral fractures only a small age indeterminate osteophyte chip.    Consultants: None  Procedures: None yet   Assessment & Plan:  Back pain secondary to fall Fall resulted in likely contusion, muscle strain.  The chipped osteophyte may be contributing to inflammation/pain.  Likely contributing to limited range of motion as well as need for assistance with ambulation.  Pain is reasonably well controlled at this time. Continue physical and Occupational Therapy.  Continue pain medications.  No neurological deficits noted.  Essential hypertension Blood pressure is reasonably well controlled. Continue amlodipine, metoprolol, ARB.  Noted to be mildly dehydrated yesterday for which he was given IV fluids.  Atherosclerosis of coronary arteries and peripheral arteries, secondary prevention Continue Plavix, Crestor, metoprolol, ARB  COPD Continue inhalers.  Stable.  Uses oxygen mainly at nighttime at home.  Glaucoma Continue eyedrops  BPH/urinary retention Noted to have a more than 500 mL of urine in his bladder yesterday evening.  In and out catheterization was ordered.  It does not look like this was done but patient mentions that he was eventually able to urinate on his own.  We will do bladder scans every shift.  Increase his Flomax to twice a day.  Patient also mentions that recently his PCP had discontinued his Flomax for unclear reasons.  PCP notes reviewed.  No mention of reasoning for discontinuation of Flomax.  Patient does not report any side effects.  We will continue for now.  Obesity  Estimated body mass index is 33.97 kg/m as calculated from the following:   Height as of  this encounter: 5\' 9"  (1.753 m).   Weight as of this encounter: 104.3 kg.  Thrombocytopenia Possibly due to nutritional deficiencies.  Follow-up with PCP.  B12 and vitamin D deficiency Will supplement.  Levels to be rechecked in a few weeks.   Disposition: Status is: Observation  The patient remains OBS appropriate and has no indication for admission, he is stable for discharge home when a safe disposition can be found by his family and Transitions of care team.  Patient lives by himself which makes discharging home an unsafe option.  Dispo: The patient is from: Home              Anticipated d/c is to: Home              Patient currently is medically stable to d/c.   Difficult to place patient No   DVT prophylaxis: Lovenox  Code Status: FULL      Subjective: Feels well.  Mentions that he was able to urinate on his own last evening.  Denies any shortness of breath.  Back pain is reasonably well controlled.   Objective: Vitals:   10/29/20 1550 10/29/20 1957 10/30/20 0501 10/30/20 0751  BP: (!) 105/58 128/63 136/69 139/69  Pulse: (!) 49 62 63 61  Resp: 16 18 18 14   Temp: (!) 97.3 F (36.3 C) 98.4 F (36.9 C) 98.2 F (36.8 C) 97.7 F (36.5 C)  TempSrc: Oral Oral Oral Oral  SpO2: 99% 98% 96% 95%  Weight:      Height:        Intake/Output Summary (  Last 24 hours) at 10/30/2020 1113 Last data filed at 10/30/2020 1007 Gross per 24 hour  Intake 1840.45 ml  Output 1300 ml  Net 540.45 ml    Filed Weights   10/26/20 1820  Weight: 104.3 kg    Examination:  General appearance: Awake alert.  In no distress Resp: Clear to auscultation bilaterally.  Normal effort Cardio: S1-S2 is normal regular.  No S3-S4.  No rubs murmurs or bruit GI: Abdomen is soft.  Nontender nondistended.  Bowel sounds are present normal.  No masses organomegaly Extremities: Able to move both of his lower extremities.  Was able to lift them off the bed. Neurologic: Alert and oriented x3.  No  focal neurological deficits.      Data Reviewed: I have personally reviewed following labs and imaging studies:  CBC: Recent Labs  Lab 10/26/20 1307 10/27/20 0438  WBC 10.2 7.6  NEUTROABS 7.0  --   HGB 16.3 15.3  HCT 46.5 46.2  MCV 92.6 92.2  PLT 122* 100*    Basic Metabolic Panel: Recent Labs  Lab 10/26/20 1307 10/27/20 0438  NA 140 142  K 4.0 3.8  CL 110 108  CO2 25 27  GLUCOSE 175* 107*  BUN 18 18  CREATININE 1.20 0.71  CALCIUM 8.5* 8.3*    GFR: Estimated Creatinine Clearance: 83.2 mL/min (by C-G formula based on SCr of 0.71 mg/dL). Liver Function Tests: Recent Labs  Lab 10/26/20 1307  AST 18  ALT 15  ALKPHOS 61  BILITOT 1.1  PROT 6.2*  ALBUMIN 3.5     Cardiac Enzymes: Recent Labs  Lab 10/26/20 1925  CKTOTAL 358      Recent Results (from the past 240 hour(s))  Resp Panel by RT-PCR (Flu A&B, Covid) Nasopharyngeal Swab     Status: None   Collection Time: 10/26/20  5:23 PM   Specimen: Nasopharyngeal Swab; Nasopharyngeal(NP) swabs in vial transport medium  Result Value Ref Range Status   SARS Coronavirus 2 by RT PCR NEGATIVE NEGATIVE Final    Comment: (NOTE) SARS-CoV-2 target nucleic acids are NOT DETECTED.  The SARS-CoV-2 RNA is generally detectable in upper respiratory specimens during the acute phase of infection. The lowest concentration of SARS-CoV-2 viral copies this assay can detect is 138 copies/mL. A negative result does not preclude SARS-Cov-2 infection and should not be used as the sole basis for treatment or other patient management decisions. A negative result may occur with  improper specimen collection/handling, submission of specimen other than nasopharyngeal swab, presence of viral mutation(s) within the areas targeted by this assay, and inadequate number of viral copies(<138 copies/mL). A negative result must be combined with clinical observations, patient history, and epidemiological information. The expected result is  Negative.  Fact Sheet for Patients:  EntrepreneurPulse.com.au  Fact Sheet for Healthcare Providers:  IncredibleEmployment.be  This test is no t yet approved or cleared by the [place] FDA and  has been authorized for detection and/or diagnosis of SARS-CoV-2 by FDA under an Emergency Use Authorization (EUA). This EUA will remain  in effect (meaning this test can be used) for the duration of the COVID-19 declaration under Section 564(b)(1) of the Act, 21 U.S.C.section 360bbb-3(b)(1), unless the authorization is terminated  or revoked sooner.       Influenza A by PCR NEGATIVE NEGATIVE Final   Influenza B by PCR NEGATIVE NEGATIVE Final    Comment: (NOTE) The Xpert Xpress SARS-CoV-2/FLU/RSV plus assay is intended as an aid in the diagnosis of influenza from Nasopharyngeal swab specimens  and should not be used as a sole basis for treatment. Nasal washings and aspirates are unacceptable for Xpert Xpress SARS-CoV-2/FLU/RSV testing.  Fact Sheet for Patients: EntrepreneurPulse.com.au  Fact Sheet for Healthcare Providers: IncredibleEmployment.be  This test is not yet approved or cleared by the [place] FDA and has been authorized for detection and/or diagnosis of SARS-CoV-2 by FDA under an Emergency Use Authorization (EUA). This EUA will remain in effect (meaning this test can be used) for the duration of the COVID-19 declaration under Section 564(b)(1) of the Act, 21 U.S.C. section 360bbb-3(b)(1), unless the authorization is terminated or revoked.  Performed at [facility], [address], [place], [place] [address]   Surgical PCR screen     Status: None   Collection Time: 10/26/20 10:17 PM   Specimen: Nasal Mucosa; Nasal Swab  Result Value Ref Range Status   MRSA, PCR NEGATIVE NEGATIVE Final   Staphylococcus aureus NEGATIVE NEGATIVE Final    Comment: (NOTE) The Xpert SA Assay (FDA  approved for NASAL specimens in patients 15 years of age and older), is one component of a comprehensive surveillance program. It is not intended to diagnose infection nor to guide or monitor treatment. Performed at [facility], [address], [place], [place] [address]          Radiology Studies: No results found.      Scheduled Meds:  acetaminophen  1,000 mg Oral TID   amLODipine  5 mg Oral Daily   clopidogrel  75 mg Oral Daily   dorzolamide-timolol  1 drop Right Eye BID   enoxaparin (LOVENOX) injection  0.5 mg/kg Subcutaneous Q24H   fluticasone furoate-vilanterol  1 puff Inhalation Daily   latanoprost  1 drop Both Eyes QHS   losartan  50 mg Oral BID   melatonin  5 mg Oral QHS   metoprolol succinate  100 mg Oral Daily   mupirocin ointment  1 application Nasal BID   pantoprazole  40 mg Oral Daily   polyethylene glycol  17 g Oral Daily   rosuvastatin  10 mg Oral QHS   senna-docusate  2 tablet Oral QHS   tamsulosin  0.4 mg Oral QPC breakfast   vitamin B-12  1,000 mcg Oral Daily   Vitamin D (Ergocalciferol)  50,000 Units Oral Q7 days   Continuous Infusions:    LOS: 0 days     Bonnielee Haff, MD Chris [facility] 10/30/2020, 11:13 AM     Please page though AMION or Epic secure chat:  For [employer], Adult nurse

## 2020-10-30 NOTE — Progress Notes (Signed)
Physical Therapy Treatment Patient Details Name: Chris Kent MRN: [id number] DOB: 11-15-1935 Today's Date: 10/30/2020   History of Present Illness Pt is an 85 y/o M admitted from home via EMS on 10/26/20 with c/c of tripping & falling. Pt found to have Acute fracture of the anterior bridging osteophyte of T11 and T12-secondary to mechanical fall. PMH: BPH, HLD, HTN, GERD, CAD    PT Comments    Pt seated in recliner beginning of session, denies pain at rest, elevates to 9/10 with mobility. Progressed amb distance w/ RW, SUPV. Overall amb remains limited 2/2 to pain but improves with standing rest breaks. Pt required 3x attempts for STS transfer w/ MIN A on last attempt and cues for forward weight shift. Skilled PT intervention is indicated to address deficits in function, mobility, and to return to PLOF as able.  Discharge recommendations remain SNF to maximize function.    Recommendations for follow up therapy are one component of a multi-disciplinary discharge planning process, led by the attending physician.  Recommendations may be updated based on patient status, additional functional criteria and insurance authorization.  Follow Up Recommendations  SNF     Equipment Recommendations  Rolling walker with 5" wheels;3in1 (PT)    Recommendations for Other Services       Precautions / Restrictions Precautions Precautions: Fall;Back Restrictions Weight Bearing Restrictions: No     Mobility  Bed Mobility               General bed mobility comments: Pt seated in recliner beginning & end of session    Transfers Overall transfer level: Needs assistance Equipment used: Rolling walker (2 wheeled) Transfers: Sit to/from Stand Sit to Stand: Min assist         General transfer comment: x 3 attempts w/ cues to keep COM over BOS, MIN A for momentum  Ambulation/Gait Ambulation/Gait assistance: Supervision Gait Distance (Feet): 35 Feet Assistive device: Rolling walker (2  wheeled) Gait Pattern/deviations: Decreased step length - right;Decreased step length - left;Decreased dorsiflexion - right;Decreased dorsiflexion - left;Decreased stride length;Trunk flexed     General Gait Details: Decreased gait speed, rest breaks x 3 2/2 to pain   Stairs             Wheelchair Mobility    Modified Rankin (Stroke Patients Only)       Balance Overall balance assessment: Needs assistance Sitting-balance support: Feet supported;Bilateral upper extremity supported Sitting balance-Leahy Scale: Good     Standing balance support: Bilateral upper extremity supported;During functional activity Standing balance-Leahy Scale: Fair Standing balance comment: requires BUE support                            Cognition Arousal/Alertness: Awake/alert Behavior During Therapy: Flat affect Overall Cognitive Status: Within Functional Limits for tasks assessed                                 General Comments: cooperative, follows one step commands      Exercises      General Comments General comments (skin integrity, edema, etc.): Room air 94% throughout mobility      Pertinent Vitals/Pain Pain Assessment: 0-10 Pain Score: 9  Pain Location: Back Pain Descriptors / Indicators: Stabbing Pain Intervention(s): Limited activity within patient's tolerance;Monitored during session;Repositioned    Home Living  Prior Function            PT Goals (current goals can now be found in the care plan section) Progress towards PT goals: Progressing toward goals    Frequency    7X/week      PT Plan Current plan remains appropriate    Co-evaluation              AM-PAC PT "6 Clicks" Mobility   Outcome Measure  Help needed turning from your back to your side while in a flat bed without using bedrails?: A Little Help needed moving from lying on your back to sitting on the side of a flat bed without  using bedrails?: A Little   Help needed standing up from a chair using your arms (e.g., wheelchair or bedside chair)?: A Little Help needed to walk in hospital room?: A Little Help needed climbing 3-5 steps with a railing? : A Lot 6 Click Score: 14    End of Session Equipment Utilized During Treatment: Gait belt Activity Tolerance: Patient tolerated treatment well;Patient limited by pain Patient left: in chair;with call bell/phone within reach;with chair alarm set Nurse Communication: Mobility status PT Visit Diagnosis: Muscle weakness (generalized) (M62.81);Pain;Difficulty in walking, not elsewhere classified (R26.2)     Time: 6568-1275 PT Time Calculation (min) (ACUTE ONLY): 26 min  Charges:                        [employer], SPT

## 2020-10-31 DIAGNOSIS — M549 Dorsalgia, unspecified: Secondary | ICD-10-CM | POA: Diagnosis not present

## 2020-10-31 LAB — RESP PANEL BY RT-PCR (FLU A&B, COVID) ARPGX2
Influenza A by PCR: NEGATIVE
Influenza B by PCR: NEGATIVE
SARS Coronavirus 2 by RT PCR: NEGATIVE

## 2020-10-31 MED ORDER — POLYETHYLENE GLYCOL 3350 17 G PO PACK: 17.0000 g | PACK | Freq: Every day | ORAL | 0 refills | Status: DC

## 2020-10-31 MED ORDER — CYCLOBENZAPRINE HCL 10 MG PO TABS: 10.0000 mg | ORAL_TABLET | Freq: Three times a day (TID) | ORAL | 0 refills | Status: DC | PRN

## 2020-10-31 MED ORDER — BISACODYL 10 MG RE SUPP: 10.0000 mg | Freq: Every day | RECTAL | 0 refills | Status: DC | PRN

## 2020-10-31 MED ORDER — METHOCARBAMOL 500 MG PO TABS: 500.0000 mg | ORAL_TABLET | Freq: Three times a day (TID) | ORAL | 0 refills | Status: DC | PRN

## 2020-10-31 MED ORDER — NAPROXEN 250 MG PO TABS: 250.0000 mg | ORAL_TABLET | Freq: Two times a day (BID) | ORAL | Status: DC | PRN

## 2020-10-31 MED ORDER — TAMSULOSIN HCL 0.4 MG PO CAPS: 0.8000 mg | ORAL_CAPSULE | Freq: Every day | ORAL | Status: DC

## 2020-10-31 MED ORDER — VITAMIN D (ERGOCALCIFEROL) 1.25 MG (50000 UNIT) PO CAPS: 50000.0000 [IU] | ORAL_CAPSULE | ORAL | Status: DC

## 2020-10-31 MED ORDER — MELATONIN 5 MG PO TABS: 5.0000 mg | ORAL_TABLET | Freq: Every day | ORAL | 0 refills | Status: DC

## 2020-10-31 MED ORDER — CYANOCOBALAMIN 1000 MCG PO TABS: 1000.0000 ug | ORAL_TABLET | Freq: Every day | ORAL | Status: DC

## 2020-10-31 MED ORDER — BUDESONIDE-FORMOTEROL FUMARATE 160-4.5 MCG/ACT IN AERO: 2.0000 | INHALATION_SPRAY | Freq: Every day | RESPIRATORY_TRACT | 12 refills | Status: DC

## 2020-10-31 MED ORDER — PANTOPRAZOLE SODIUM 40 MG PO TBEC: 40.0000 mg | DELAYED_RELEASE_TABLET | Freq: Every day | ORAL | 3 refills | Status: DC

## 2020-10-31 MED ORDER — ACETAMINOPHEN 500 MG PO TABS: 1000.0000 mg | ORAL_TABLET | Freq: Three times a day (TID) | ORAL | 0 refills | Status: DC

## 2020-10-31 MED ORDER — SENNOSIDES-DOCUSATE SODIUM 8.6-50 MG PO TABS: 2.0000 | ORAL_TABLET | Freq: Every day | ORAL | Status: DC

## 2020-10-31 MED ORDER — METHOCARBAMOL 500 MG PO TABS
500.0000 mg | ORAL_TABLET | Freq: Three times a day (TID) | ORAL | Status: DC | PRN
  Administered 2020-10-31: 500 mg via ORAL
  Filled 2020-10-31: qty 1

## 2020-10-31 MED ORDER — ENOXAPARIN SODIUM 60 MG/0.6ML IJ SOSY: 0.5000 mg/kg | PREFILLED_SYRINGE | INTRAMUSCULAR | Status: DC

## 2020-10-31 MED ORDER — ALBUTEROL SULFATE (2.5 MG/3ML) 0.083% IN NEBU: 3.0000 mL | INHALATION_SOLUTION | Freq: Four times a day (QID) | RESPIRATORY_TRACT | 12 refills | Status: DC | PRN

## 2020-10-31 MED ORDER — OXYCODONE HCL 5 MG PO TABS: 5.0000 mg | ORAL_TABLET | Freq: Four times a day (QID) | ORAL | 0 refills | Status: DC | PRN

## 2020-10-31 NOTE — Discharge Summary (Signed)
[facility]  Physician Discharge Summary   Patient ID: Chris Kent MRN: [id number] DOB/AGE: May 04, 1935 85 y.o.  Admit date: 10/26/2020 Discharge date:   10/31/2020   PCP: Idelle Crouch, MD  DISCHARGE DIAGNOSES:  Back pain secondary to fall Essential hypertension Atherosclerosis of coronary arteries COPD Glaucoma History of BPH Thrombocytopenia Vitamin B12 and vitamin D deficiencies  RECOMMENDATIONS FOR OUTPATIENT FOLLOW UP: Please check vitamin B12 and vitamin D levels in 4 to 6 weeks CBC and basic metabolic panel every week   Home Health: SNF Equipment/Devices: None  CODE STATUS: Full code  DISCHARGE CONDITION: fair  Diet recommendation: Heart healthy  INITIAL HISTORY: Chris Kent is a 85 y.o. M with HTN, obesity, who presented after a fall. Had flank and upper back pain. In the ER, CT imaging showed no rib or vertebral fractures only a small age indeterminate osteophyte chip.    HOSPITAL COURSE:   Back pain secondary to fall Fall resulted in likely contusion, muscle strain.  The chipped osteophyte may be contributing to inflammation/pain.  Likely contributing to limited range of motion as well as need for assistance with ambulation.  Pain is reasonably well controlled at this time.  No neurological deficits noted.  Seen by PT and OT who recommend skilled nursing facility for short-term rehab.  Patient lives by himself and will not be able to go back home and is present condition.  Medications.  Muscle relaxants.   Essential hypertension Has been on amlodipine, metoprolol and ARB.  Blood pressure is noted to be borderline today.  We will stop his amlodipine going forward.  Can be reintroduced depending on his blood pressure trends at skilled nursing facility.     Atherosclerosis of coronary arteries and peripheral arteries, secondary prevention Continue Plavix, Crestor, metoprolol, ARB   COPD Continue inhalers.  Stable.  Uses oxygen mainly at  nighttime at home.   Glaucoma Continue eyedrops  BPH/urinary retention Flomax being continued.  Did retain urine versus was able to void on his own.  Did not require Foley catheterization.  Continue to monitor for now.  Outpatient follow-up with urology.  He follows with Dr. Glori Luis   Thrombocytopenia Possibly due to nutritional deficiencies.  Follow-up with PCP.  Check levels periodically at skilled nursing facility.   B12 and vitamin D deficiency Will supplement.  Levels to be rechecked in a few weeks.    Obesity  Estimated body mass index is 33.97 kg/m as calculated from the following:   Height as of this encounter: 5\' 9"  (1.753 m).   Weight as of this encounter: 104.3 kg.   Patient is stable.  Okay for discharge to SNF.  It is available   PERTINENT LABS:  The results of significant diagnostics from this hospitalization (including imaging, microbiology, ancillary and laboratory) are listed below for reference.    Microbiology: Recent Results (from the past 240 hour(s))  Resp Panel by RT-PCR (Flu A&B, Covid) Nasopharyngeal Swab     Status: None   Collection Time: 10/26/20  5:23 PM   Specimen: Nasopharyngeal Swab; Nasopharyngeal(NP) swabs in vial transport medium  Result Value Ref Range Status   SARS Coronavirus 2 by RT PCR NEGATIVE NEGATIVE Final    Comment: (NOTE) SARS-CoV-2 target nucleic acids are NOT DETECTED.  The SARS-CoV-2 RNA is generally detectable in upper respiratory specimens during the acute phase of infection. The lowest concentration of SARS-CoV-2 viral copies this assay can detect is 138 copies/mL. A negative result does not preclude SARS-Cov-2 infection and should not be  used as the sole basis for treatment or other patient management decisions. A negative result may occur with  improper specimen collection/handling, submission of specimen other than nasopharyngeal swab, presence of viral mutation(s) within the areas targeted by this assay, and  inadequate number of viral copies(<138 copies/mL). A negative result must be combined with clinical observations, patient history, and epidemiological information. The expected result is Negative.  Fact Sheet for Patients:  EntrepreneurPulse.com.au  Fact Sheet for Healthcare Providers:  IncredibleEmployment.be  This test is no t yet approved or cleared by the [place] FDA and  has been authorized for detection and/or diagnosis of SARS-CoV-2 by FDA under an Emergency Use Authorization (EUA). This EUA will remain  in effect (meaning this test can be used) for the duration of the COVID-19 declaration under Section 564(b)(1) of the Act, 21 U.S.C.section 360bbb-3(b)(1), unless the authorization is terminated  or revoked sooner.       Influenza A by PCR NEGATIVE NEGATIVE Final   Influenza B by PCR NEGATIVE NEGATIVE Final    Comment: (NOTE) The Xpert Xpress SARS-CoV-2/FLU/RSV plus assay is intended as an aid in the diagnosis of influenza from Nasopharyngeal swab specimens and should not be used as a sole basis for treatment. Nasal washings and aspirates are unacceptable for Xpert Xpress SARS-CoV-2/FLU/RSV testing.  Fact Sheet for Patients: EntrepreneurPulse.com.au  Fact Sheet for Healthcare Providers: IncredibleEmployment.be  This test is not yet approved or cleared by the [place] FDA and has been authorized for detection and/or diagnosis of SARS-CoV-2 by FDA under an Emergency Use Authorization (EUA). This EUA will remain in effect (meaning this test can be used) for the duration of the COVID-19 declaration under Section 564(b)(1) of the Act, 21 U.S.C. section 360bbb-3(b)(1), unless the authorization is terminated or revoked.  Performed at [facility], [address] [place], [place] [address]   Surgical PCR screen     Status: None   Collection Time: 10/26/20 10:17 PM   Specimen:  Nasal Mucosa; Nasal Swab  Result Value Ref Range Status   MRSA, PCR NEGATIVE NEGATIVE Final   Staphylococcus aureus NEGATIVE NEGATIVE Final    Comment: (NOTE) The Xpert SA Assay (FDA approved for NASAL specimens in patients 37 years of age and older), is one component of a comprehensive surveillance program. It is not intended to diagnose infection nor to guide or monitor treatment. Performed at [facility], [address] [place], [place] [address]      Labs:  COVID-19 Labs  Lab Results  Component Value Date   [place] 10/26/2020      Basic Metabolic Panel: Recent Labs  Lab 10/26/20 1307 10/27/20 0438  NA 140 142  K 4.0 3.8  CL 110 108  CO2 25 27  GLUCOSE 175* 107*  BUN 18 18  CREATININE 1.20 0.71  CALCIUM 8.5* 8.3*   Liver Function Tests: Recent Labs  Lab 10/26/20 1307  AST 18  ALT 15  ALKPHOS 61  BILITOT 1.1  PROT 6.2*  ALBUMIN 3.5    CBC: Recent Labs  Lab 10/26/20 1307 10/27/20 0438  WBC 10.2 7.6  NEUTROABS 7.0  --   HGB 16.3 15.3  HCT 46.5 46.2  MCV 92.6 92.2  PLT 122* 100*   Cardiac Enzymes: Recent Labs  Lab 10/26/20 1925  CKTOTAL 26      IMAGING STUDIES CT HEAD WO CONTRAST (5MM)  Result Date: 10/26/2020 CLINICAL DATA:  Cerebral hemorrhage suspected Patient reports fall this morning. EXAM: CT HEAD WITHOUT CONTRAST TECHNIQUE:  Contiguous axial images were obtained from the base of the skull through the vertex without intravenous contrast. COMPARISON:  Head CT 02/20/2018 FINDINGS: Brain: Stable degree of atrophy and chronic small vessel ischemia from prior exam. No intracranial hemorrhage, mass effect, or midline shift. No hydrocephalus. The basilar cisterns are patent. No evidence of territorial infarct or acute ischemia. No extra-axial or intracranial fluid collection. Vascular: Atherosclerosis of skullbase vasculature without hyperdense vessel or abnormal calcification. Skull: No fracture or focal lesion.  Sinuses/Orbits: Small mucous retention cyst left maxillary sinus no acute findings. No mastoid effusion. Other: None. IMPRESSION: 1. No acute intracranial abnormality. No skull fracture. 2. Stable atrophy and chronic small vessel ischemia. Electronically Signed   By: Keith Rake M.D.   On: 10/26/2020 18:23   CT Cervical Spine Wo Contrast  Result Date: 10/26/2020 CLINICAL DATA:  Neck trauma (Age >= 65y) Fall this morning. EXAM: CT CERVICAL SPINE WITHOUT CONTRAST TECHNIQUE: Multidetector CT imaging of the cervical spine was performed without intravenous contrast. Multiplanar CT image reconstructions were also generated. COMPARISON:  Cervical spine CT 02/20/2018 FINDINGS: Alignment: No traumatic subluxation. Skull base and vertebrae: Interval posterior rod with intrapedicular screw fusion from C4 through T2 fixating prior C7 fracture. Intact hardware. Previous fracture has healed. There is no new or acute fracture. Bulky flowing anterior osteophytes throughout the cervical spine are not significantly changed. Chronic degenerative change at C1-C2 with partial bony ankylosis, also stable. Intact dens and skull base. Soft tissues and spinal canal: Partially obscured by streak artifact from spinal fusion hardware. There is no prevertebral soft tissue edema. Disc levels: Large flowing anterior osteophytes. No high-grade canal stenosis. Upper chest: Assessed on earlier chest CT, reported separately. No acute apical findings. Other: None. IMPRESSION: 1. No acute fracture or subluxation of the cervical spine. 2. Interval posterior rod with intrapedicular screw fusion from C4 through T2 fixating prior C7 fracture. Previous fracture has healed. Intact hardware. 3. Large flowing anterior osteophytes throughout the cervical spine. Electronically Signed   By: Keith Rake M.D.   On: 10/26/2020 18:28   CT CHEST ABDOMEN PELVIS W CONTRAST  Result Date: 10/26/2020 CLINICAL DATA:  Fall from ramp, pain EXAM: CT CHEST,  ABDOMEN, AND PELVIS WITH CONTRAST TECHNIQUE: Multidetector CT imaging of the chest, abdomen and pelvis was performed following the standard protocol during bolus administration of intravenous contrast. CONTRAST:  69mL OMNIPAQUE IOHEXOL 350 MG/ML SOLN COMPARISON:  07/16/2017 FINDINGS: CT CHEST FINDINGS Cardiovascular: Aortic atherosclerosis. Cardiomegaly. Left coronary artery calcifications. Trace pericardial effusion. Mediastinum/Nodes: No enlarged mediastinal, hilar, or axillary lymph nodes. Thyroid gland, trachea, and esophagus demonstrate no significant findings. Lungs/Pleura: Diffuse bilateral bronchial wall thickening with dependent bibasilar scarring and or atelectasis. Multiple small pulmonary nodules are present bilaterally, stable compared to prior examination dated 07/16/2017 and benign, for example a 5 mm nodule of the right middle lobe (series 3, image 74). No pleural effusion or pneumothorax. Musculoskeletal: No chest wall mass or suspicious bone lesions identified. 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 There is adjacent perivertebral fat stranding (series 2, image 54). CT ABDOMEN PELVIS FINDINGS Hepatobiliary: No focal liver abnormality is seen. Status post cholecystectomy. No biliary dilatation. Pancreas: Unremarkable. No pancreatic ductal dilatation or surrounding inflammatory changes. Spleen: Normal in size without significant abnormality. Adrenals/Urinary Tract: Adrenal glands are unremarkable. Kidneys are normal, without renal calculi, solid lesion, or hydronephrosis. Bladder is unremarkable. Stomach/Bowel: Stomach is within normal limits. Appendix  appears normal. No evidence of bowel wall thickening, distention, or inflammatory changes. Vascular/Lymphatic: Aortic atherosclerosis. No enlarged abdominal or pelvic lymph nodes. Reproductive: Prostatomegaly. Other: Small, fat  containing bilateral inguinal hernias. No abdominopelvic ascites. Musculoskeletal: No acute or significant osseous findings. IMPRESSION: 1. No CT evidence of acute traumatic injury to the organs of the chest, abdomen, or pelvis. 2. 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 There is adjacent perivertebral fat stranding. No obvious epidural hematoma. 3. Multiple small pulmonary nodules are stable and definitively benign. 4. Prostatomegaly. 5. Coronary artery disease. Aortic Atherosclerosis (ICD10-I70.0). Electronically Signed   By: Eddie Candle M.D.   On: 10/26/2020 14:52    DISCHARGE EXAMINATION: Vitals:   10/30/20 2004 10/31/20 0515 10/31/20 0830 10/31/20 1104  BP: 126/60 129/66 (!) 110/58 (!) 113/58  Pulse: (!) 58 (!) 58 60 (!) 50  Resp: 19 18 15 14   Temp: 97.9 F (36.6 C) 98 F (36.7 C) 97.7 F (36.5 C) 97.6 F (36.4 C)  TempSrc:      SpO2: 99% 97% 98% 100%  Weight:      Height:       General appearance: Awake alert.  In no distress Resp: Clear to auscultation bilaterally.  Normal effort Cardio: S1-S2 is normal regular.  No S3-S4.  No rubs murmurs or bruit GI: Abdomen is soft.  Nontender nondistended.  Bowel sounds are present normal.  No masses organomegaly Extremities: No edema.  Able to lift both legs off the bed. Neurologic: Alert and oriented x3.  No focal neurological deficits.    DISPOSITION: SNF  Discharge Instructions     Call MD for:  difficulty breathing, headache or visual disturbances   Complete by: As directed    Call MD for:  extreme fatigue   Complete by: As directed    Call MD for:  persistant dizziness or light-headedness   Complete by: As directed    Call MD for:  persistant nausea and vomiting   Complete by: As directed    Call MD for:  severe uncontrolled pain   Complete by: As directed    Call MD for:  temperature >100.4   Complete by: As directed    Diet  - low sodium heart healthy   Complete by: As directed    Discharge instructions   Complete by: As directed    Please review instructions o the discharge summary.  You were cared for by a hospitalist during your hospital stay. If you have any questions about your discharge medications or the care you received while you were in the hospital after you are discharged, you can call the unit and asked to speak with the hospitalist on call if the hospitalist that took care of you is not available. Once you are discharged, your primary care physician will handle any further medical issues. Please note that NO REFILLS for any discharge medications will be authorized once you are discharged, as it is imperative that you return to your primary care physician (or establish a relationship with a primary care physician if you do not have one) for your aftercare needs so that they can reassess your need for medications and monitor your lab values. If you do not have a primary care physician, you can call [phone] for a physician referral.   Increase activity slowly   Complete by: As directed          Allergies as  of 10/31/2020   No Known Allergies      Medication List     STOP taking these medications    albuterol 108 (90 Base) MCG/ACT inhaler Commonly known as: VENTOLIN HFA Replaced by: albuterol (2.5 MG/3ML) 0.083% nebulizer solution   amLODipine 5 MG tablet Commonly known as: NORVASC   Breo Ellipta 200-25 MCG/INH Aepb Generic drug: fluticasone furoate-vilanterol Replaced by: budesonide-formoterol 160-4.5 MCG/ACT inhaler   cyclobenzaprine 10 MG tablet Commonly known as: FLEXERIL   omeprazole 20 MG capsule Commonly known as: PRILOSEC Replaced by: pantoprazole 40 MG tablet   zolpidem 10 MG tablet Commonly known as: AMBIEN       TAKE these medications    acetaminophen 500 MG tablet Commonly known as: TYLENOL Take 2 tablets (1,000 mg total) by mouth 3 (three) times daily.    albuterol (2.5 MG/3ML) 0.083% nebulizer solution Commonly known as: PROVENTIL Take 3 mLs by nebulization every 6 (six) hours as needed for wheezing or shortness of breath. Replaces: albuterol 108 (90 Base) MCG/ACT inhaler   bisacodyl 10 MG suppository Commonly known as: DULCOLAX Place 1 suppository (10 mg total) rectally daily as needed for moderate constipation.   budesonide-formoterol 160-4.5 MCG/ACT inhaler Commonly known as: SYMBICORT Inhale 2 puffs into the lungs daily. Replaces: Breo Ellipta 200-25 MCG/INH Aepb   cetirizine 10 MG tablet Commonly known as: ZYRTEC Take 10 mg by mouth at bedtime.   clopidogrel 75 MG tablet Commonly known as: PLAVIX Take 75 mg by mouth daily.   cyanocobalamin 1000 MCG tablet Take 1 tablet (1,000 mcg total) by mouth daily.   dorzolamidel-timolol 22.3-6.8 MG/ML Soln ophthalmic solution Commonly known as: COSOPT Place 1 drop into the right eye 2 (two) times daily.   enoxaparin 60 MG/0.6ML injection Commonly known as: LOVENOX Inject 0.525 mLs (52.5 mg total) into the skin daily for 7 days. May discontinue once mobility improves   latanoprost 0.005 % ophthalmic solution Commonly known as: XALATAN Place 1 drop into both eyes at bedtime.   losartan 50 MG tablet Commonly known as: COZAAR Take 50 mg by mouth 2 (two) times daily.   melatonin 5 MG Tabs Take 1 tablet (5 mg total) by mouth at bedtime.   methocarbamol 500 MG tablet Commonly known as: ROBAXIN Take 1 tablet (500 mg total) by mouth every 8 (eight) hours as needed for muscle spasms.   metoprolol succinate 100 MG 24 hr tablet Commonly known as: TOPROL-XL Take 100 mg by mouth daily.   naproxen 250 MG tablet Commonly known as: NAPROSYN Take 1 tablet (250 mg total) by mouth 2 (two) times daily as needed (for pain not relieved with oxycodone).   oxyCODONE 5 MG immediate release tablet Commonly known as: Oxy IR/ROXICODONE Take 1 tablet (5 mg total) by mouth every 6 (six) hours  as needed for breakthrough pain.   pantoprazole 40 MG tablet Commonly known as: PROTONIX Take 1 tablet (40 mg total) by mouth daily. Replaces: omeprazole 20 MG capsule   polyethylene glycol 17 g packet Commonly known as: MIRALAX / GLYCOLAX Take 17 g by mouth daily.   rosuvastatin 10 MG tablet Commonly known as: CRESTOR Take 10 mg by mouth at bedtime.   senna-docusate 8.6-50 MG tablet Commonly known as: Senokot-S Take 2 tablets by mouth at bedtime.   tamsulosin 0.4 MG Caps capsule Commonly known as: FLOMAX Take 2 capsules (0.8 mg total) by mouth daily after supper. What changed:  how much to take when to take this   triamcinolone cream 0.1 % Commonly known  as: KENALOG Apply 1 application topically 3 (three) times daily. (Apply to rash on legs)   Vitamin D (Ergocalciferol) 1.25 MG (50000 UNIT) Caps capsule Commonly known as: DRISDOL Take 1 capsule (50,000 Units total) by mouth every 7 (seven) days. Start taking on: November 05, 2020          Contact information for after-discharge care     Destination     [facility] .   Service: Skilled Nursing Contact information: [place] [phone]                     TOTAL DISCHARGE TIME: 35-minute  Bonnielee Haff  [facility] Pager on [email or web address]  10/31/2020, 11:27 AM

## 2020-10-31 NOTE — TOC Progression Note (Signed)
Transition of Care [facility]) - Progression Note    Patient Details  Name: Chris Kent MRN: [id number] Date of Birth: 1935-11-23  [facility]) CM/SW [place], RN Phone Number: 10/31/2020, 3:02 PM  Clinical Narrative:    Received a call from [employer] with auth approval to go to [facility], [place] number [phone], EMS approval for [facility] [id number], I notified the patient and [facility]   Expected Discharge Plan: [place] Barriers to Discharge: Continued Medical Work up  Expected Discharge Plan and Services Expected Discharge Plan: [place]   Discharge Planning Services: CM Consult Post Acute Care Choice: [place] arrangements for the past 2 months: Single Family Home                                       Social Determinants of Health (SDOH) Interventions    Readmission Risk Interventions No flowsheet data found.

## 2020-10-31 NOTE — Plan of Care (Signed)
Pt remains A&O X4, on 2L O2 via [place]. Pt has been urinating adequately with qshift bladder scan 171ml. No BM overnight but patient passing gas. Pt c/o 5/10 pain but declined PRN oxy stating, he is comfortable and would like to take it if pain doesn't get better with rest. Pt remains on falls precaution but has been ambulating from [facility] to bed with contact guard assist.

## 2020-10-31 NOTE — TOC Progression Note (Signed)
Transition of Care [facility]) - Progression Note    Patient Details  Name: Chris Kent MRN: [id number] Date of Birth: 20-Apr-1935  Transition of Care [facility]) CM/SW [place], RN Phone Number: 10/31/2020, 1:24 PM  Clinical Narrative:   Reached out to [employer] and inquired about insurance auth, left a secure VM for a call back with status    Expected Discharge Plan: [place] Barriers to Discharge: Continued Medical Work up  Expected Discharge Plan and Services Expected Discharge Plan: [place]   Discharge Planning Services: CM Consult Post Acute Care Choice: [place] Living arrangements for the past 2 months: Single Family Home                                       Social Determinants of Health (SDOH) Interventions    Readmission Risk Interventions No flowsheet data found.

## 2020-10-31 NOTE — Progress Notes (Signed)
Physical Therapy Treatment Patient Details Name: Chris Kent MRN: [id number] DOB: 1936/01/07 Today's Date: 10/31/2020   History of Present Illness Pt is an 85 y/o M admitted from home via EMS on 10/26/20 with c/c of tripping & falling. Pt found to have Acute fracture of the anterior bridging osteophyte of T11 and T12-secondary to mechanical fall. PMH: BPH, HLD, HTN, GERD, CAD    PT Comments    Pt alert sitting in recliner beginning of session stating pain 2/10 to mid back. Progressed ambulation distance w/ RW, SUPERVISION w/ ~ 4 standing rest breaks secondary to pain on RA (SpO2 > 90%). Pt demonstrated improvement in cadence and overall gait velocity this session compared to previous. Transfers still require MIN A due to lack of strength at end of transfer w/ RW. Discharge recommendations remain SNF to maximize pt function. Skilled PT intervention is indicated to address deficits in function, mobility, and to return to PLOF as able.     Recommendations for follow up therapy are one component of a multi-disciplinary discharge planning process, led by the attending physician.  Recommendations may be updated based on patient status, additional functional criteria and insurance authorization.  Follow Up Recommendations  SNF     Equipment Recommendations  Rolling walker with 5" wheels;3in1 (PT)    Recommendations for Other Services       Precautions / Restrictions Precautions Precautions: Fall;Back Restrictions Weight Bearing Restrictions: No     Mobility  Bed Mobility               General bed mobility comments: Pt seated in recliner beginning & end of session    Transfers Overall transfer level: Needs assistance Equipment used: Rolling walker (2 wheeled) Transfers: Sit to/from Stand Sit to Stand: Min assist         General transfer comment: MIN A for end of transfer, physical support  Ambulation/Gait Ambulation/Gait assistance: Supervision Gait Distance  (Feet): 190 Feet Assistive device: Rolling walker (2 wheeled) Gait Pattern/deviations: Decreased step length - right;Decreased step length - left;Decreased dorsiflexion - right;Decreased dorsiflexion - left;Decreased stride length;Trunk flexed     General Gait Details: ~ 4 standing rest breaks 2/2 to pain, improved cadence and gait speed,   Stairs             Wheelchair Mobility    Modified Rankin (Stroke Patients Only)       Balance Overall balance assessment: Needs assistance Sitting-balance support: Single extremity supported;Feet supported Sitting balance-Leahy Scale: Good     Standing balance support: Bilateral upper extremity supported;During functional activity Standing balance-Leahy Scale: Fair Standing balance comment: requires BUE support                            Cognition Arousal/Alertness: Awake/alert Behavior During Therapy: Flat affect;WFL for tasks assessed/performed Overall Cognitive Status: Within Functional Limits for tasks assessed                                        Exercises      General Comments General comments (skin integrity, edema, etc.): RA throughout mobility 92% SpO2      Pertinent Vitals/Pain Pain Assessment: 0-10 Pain Score: 2  Pain Location: Back Pain Descriptors / Indicators: Aching Pain Intervention(s): Limited activity within patient's tolerance;Monitored during session;Premedicated before session;Repositioned    Home Living  Prior Function            PT Goals (current goals can now be found in the care plan section) Progress towards PT goals: Progressing toward goals    Frequency    7X/week      PT Plan Current plan remains appropriate    Co-evaluation              AM-PAC PT "6 Clicks" Mobility   Outcome Measure  Help needed turning from your back to your side while in a flat bed without using bedrails?: None Help needed moving from  lying on your back to sitting on the side of a flat bed without using bedrails?: A Little Help needed moving to and from a bed to a chair (including a wheelchair)?: A Little Help needed standing up from a chair using your arms (e.g., wheelchair or bedside chair)?: A Little Help needed to walk in hospital room?: A Little Help needed climbing 3-5 steps with a railing? : A Lot 6 Click Score: 18    End of Session Equipment Utilized During Treatment: Gait belt Activity Tolerance: Patient tolerated treatment well Patient left: in chair;with call bell/phone within reach;with chair alarm set Nurse Communication: Mobility status PT Visit Diagnosis: Muscle weakness (generalized) (M62.81);Pain;Difficulty in walking, not elsewhere classified (R26.2)     Time: 9741-6384 PT Time Calculation (min) (ACUTE ONLY): 24 min  Charges:  $Gait Training: 8-22 mins $Therapeutic Exercise: 8-22 mins                     [employer], SPT

## 2020-11-01 DIAGNOSIS — R5381 Other malaise: Secondary | ICD-10-CM | POA: Diagnosis not present

## 2020-11-01 DIAGNOSIS — R270 Ataxia, unspecified: Secondary | ICD-10-CM | POA: Diagnosis not present

## 2020-11-01 DIAGNOSIS — I10 Essential (primary) hypertension: Secondary | ICD-10-CM | POA: Diagnosis not present

## 2020-11-01 DIAGNOSIS — M5440 Lumbago with sciatica, unspecified side: Secondary | ICD-10-CM | POA: Diagnosis not present

## 2020-11-01 DIAGNOSIS — R296 Repeated falls: Secondary | ICD-10-CM | POA: Diagnosis not present

## 2020-11-01 DIAGNOSIS — S299XXA Unspecified injury of thorax, initial encounter: Secondary | ICD-10-CM | POA: Diagnosis not present

## 2020-11-01 DIAGNOSIS — N400 Enlarged prostate without lower urinary tract symptoms: Secondary | ICD-10-CM | POA: Diagnosis not present

## 2020-11-01 DIAGNOSIS — J449 Chronic obstructive pulmonary disease, unspecified: Secondary | ICD-10-CM | POA: Diagnosis not present

## 2020-11-01 DIAGNOSIS — I2510 Atherosclerotic heart disease of native coronary artery without angina pectoris: Secondary | ICD-10-CM | POA: Diagnosis not present

## 2020-11-01 DIAGNOSIS — M6281 Muscle weakness (generalized): Secondary | ICD-10-CM | POA: Diagnosis not present

## 2020-11-01 DIAGNOSIS — R279 Unspecified lack of coordination: Secondary | ICD-10-CM | POA: Diagnosis not present

## 2020-11-01 LAB — CBC
HCT: 43.4 % (ref 39.0–52.0)
Hemoglobin: 15.2 g/dL (ref 13.0–17.0)
MCH: 32.3 pg (ref 26.0–34.0)
MCHC: 35 g/dL (ref 30.0–36.0)
MCV: 92.1 fL (ref 80.0–100.0)
Platelets: 116 10*3/uL — ABNORMAL LOW (ref 150–400)
RBC: 4.71 MIL/uL (ref 4.22–5.81)
RDW: 14.6 % (ref 11.5–15.5)
WBC: 6 10*3/uL (ref 4.0–10.5)
nRBC: 0 % (ref 0.0–0.2)

## 2020-11-01 LAB — BASIC METABOLIC PANEL
Anion gap: 8 (ref 5–15)
BUN: 27 mg/dL — ABNORMAL HIGH (ref 8–23)
CO2: 26 mmol/L (ref 22–32)
Calcium: 8.6 mg/dL — ABNORMAL LOW (ref 8.9–10.3)
Chloride: 105 mmol/L (ref 98–111)
Creatinine, Ser: 0.84 mg/dL (ref 0.61–1.24)
GFR, Estimated: >60 mL/min (ref >60)
Glucose, Bld: 95 mg/dL (ref 70–99)
Potassium: 4.1 mmol/L (ref 3.5–5.1)
Sodium: 139 mmol/L (ref 135–145)

## 2020-11-01 MED ORDER — METOPROLOL SUCCINATE ER 50 MG PO TB24
50.0000 mg | ORAL_TABLET | Freq: Every day | ORAL | Status: DC
  Administered 2020-11-01: 50 mg via ORAL
  Filled 2020-11-01: qty 1

## 2020-11-01 MED ORDER — METOPROLOL SUCCINATE ER 50 MG PO TB24: 50.0000 mg | ORAL_TABLET | Freq: Every day | ORAL | Status: DC

## 2020-11-01 NOTE — Discharge Summary (Signed)
[facility]  Physician Discharge Summary   Patient ID: Chris Kent MRN: [id number] DOB/AGE: [birth year] 85 y.o.  Admit date: 10/26/2020 Discharge date:   11/01/2020   PCP: Chris Crouch, MD  DISCHARGE DIAGNOSES:  Back pain secondary to fall Essential hypertension Atherosclerosis of coronary arteries COPD Glaucoma History of BPH Thrombocytopenia Vitamin B12 and vitamin D deficiencies  RECOMMENDATIONS FOR OUTPATIENT FOLLOW UP: Please check vitamin B12 and vitamin D levels in 4 to 6 weeks CBC and basic metabolic panel every week starting next week   Home Health: SNF Equipment/Devices: None  CODE STATUS: Full code  DISCHARGE CONDITION: fair  Diet recommendation: Heart healthy  INITIAL HISTORY: Chris Kent is a 85 y.o. M with HTN, obesity, who presented after a fall. Had flank and upper back pain. In the ER, CT imaging showed no rib or vertebral fractures only a small age indeterminate osteophyte chip.   HOSPITAL COURSE:   Back pain secondary to fall Fall resulted in likely contusion, muscle strain.  The chipped osteophyte may be contributing to inflammation/pain.  Likely contributing to limited range of motion as well as need for assistance with ambulation.  Pain is reasonably well controlled at this time.  No neurological deficits noted.  Seen by PT and OT who recommend skilled nursing facility for short-term rehab.  Patient lives by himself and will not be able to go back home in his present condition.  Pain medications.  Muscle relaxants.   Essential hypertension Has been on amlodipine, metoprolol and ARB.  Blood pressure is noted to be borderline low.  We will stop his amlodipine and decrease dose of metoprolol. Can be reintroduced depending on his blood pressure trends at skilled nursing facility.     Atherosclerosis of coronary arteries and peripheral arteries, secondary prevention Continue Plavix, Crestor, metoprolol, ARB   COPD Continue  inhalers.  Stable.  Uses oxygen mainly at nighttime at home.   Glaucoma Continue eyedrops  BPH/urinary retention Flomax being continued.  Did retain urine versus was able to void on his own.  Did not require Foley catheterization.  Continue to monitor for now.  Outpatient follow-up with urology.  He follows with Dr. Glori Luis   Thrombocytopenia Possibly due to nutritional deficiencies.  Follow-up with PCP.  Check levels periodically at skilled nursing facility.   B12 and vitamin D deficiency Will supplement.  Levels to be rechecked in a few weeks.    Obesity  Estimated body mass index is 33.97 kg/m as calculated from the following:   Height as of this encounter: 5\' 9"  (1.753 m).   Weight as of this encounter: 104.3 kg.   Patient is stable.  Okay for discharge to SNF.    PERTINENT LABS:  The results of significant diagnostics from this hospitalization (including imaging, microbiology, ancillary and laboratory) are listed below for reference.    Microbiology: Recent Results (from the past 240 hour(s))  Resp Panel by RT-PCR (Flu A&B, Covid) Nasopharyngeal Swab     Status: None   Collection Time: 10/26/20  5:23 PM   Specimen: Nasopharyngeal Swab; Nasopharyngeal(NP) swabs in vial transport medium  Result Value Ref Range Status   SARS Coronavirus 2 by RT PCR NEGATIVE NEGATIVE Final    Comment: (NOTE) SARS-CoV-2 target nucleic acids are NOT DETECTED.  The SARS-CoV-2 RNA is generally detectable in upper respiratory specimens during the acute phase of infection. The lowest concentration of SARS-CoV-2 viral copies this assay can detect is 138 copies/mL. A negative result does not preclude SARS-Cov-2 infection and should  not be used as the sole basis for treatment or other patient management decisions. A negative result may occur with  improper specimen collection/handling, submission of specimen other than nasopharyngeal swab, presence of viral mutation(s) within the areas targeted  by this assay, and inadequate number of viral copies(<138 copies/mL). A negative result must be combined with clinical observations, patient history, and epidemiological information. The expected result is Negative.  Fact Sheet for Patients:  EntrepreneurPulse.com.au  Fact Sheet for Healthcare Providers:  IncredibleEmployment.be  This test is no t yet approved or cleared by the [place] FDA and  has been authorized for detection and/or diagnosis of SARS-CoV-2 by FDA under an Emergency Use Authorization (EUA). This EUA will remain  in effect (meaning this test can be used) for the duration of the COVID-19 declaration under Section 564(b)(1) of the Act, 21 U.S.C.section 360bbb-3(b)(1), unless the authorization is terminated  or revoked sooner.       Influenza A by PCR NEGATIVE NEGATIVE Final   Influenza B by PCR NEGATIVE NEGATIVE Final    Comment: (NOTE) The Xpert Xpress SARS-CoV-2/FLU/RSV plus assay is intended as an aid in the diagnosis of influenza from Nasopharyngeal swab specimens and should not be used as a sole basis for treatment. Nasal washings and aspirates are unacceptable for Xpert Xpress SARS-CoV-2/FLU/RSV testing.  Fact Sheet for Patients: EntrepreneurPulse.com.au  Fact Sheet for Healthcare Providers: IncredibleEmployment.be  This test is not yet approved or cleared by the [place] FDA and has been authorized for detection and/or diagnosis of SARS-CoV-2 by FDA under an Emergency Use Authorization (EUA). This EUA will remain in effect (meaning this test can be used) for the duration of the COVID-19 declaration under Section 564(b)(1) of the Act, 21 U.S.C. section 360bbb-3(b)(1), unless the authorization is terminated or revoked.  Performed at [facility], [address], [place], [place] [address]   Surgical PCR screen     Status: None   Collection Time: 10/26/20 10:17  PM   Specimen: Nasal Mucosa; Nasal Swab  Result Value Ref Range Status   MRSA, PCR NEGATIVE NEGATIVE Final   Staphylococcus aureus NEGATIVE NEGATIVE Final    Comment: (NOTE) The Xpert SA Assay (FDA approved for NASAL specimens in patients 60 years of age and older), is one component of a comprehensive surveillance program. It is not intended to diagnose infection nor to guide or monitor treatment. Performed at [facility], [address] [place],  [address]   Resp Panel by RT-PCR (Flu A&B, Covid) Nasopharyngeal Swab     Status: None   Collection Time: 10/31/20  2:29 PM   Specimen: Nasopharyngeal Swab; Nasopharyngeal(NP) swabs in vial transport medium  Result Value Ref Range Status   SARS Coronavirus 2 by RT PCR NEGATIVE NEGATIVE Final    Comment: (NOTE) SARS-CoV-2 target nucleic acids are NOT DETECTED.  The SARS-CoV-2 RNA is generally detectable in upper respiratory specimens during the acute phase of infection. The lowest concentration of SARS-CoV-2 viral copies this assay can detect is 138 copies/mL. A negative result does not preclude SARS-Cov-2 infection and should not be used as the sole basis for treatment or other patient management decisions. A negative result may occur with  improper specimen collection/handling, submission of specimen other than nasopharyngeal swab, presence of viral mutation(s) within the areas targeted by this assay, and inadequate number of viral copies(<138 copies/mL). A negative result must be combined with clinical observations, patient history, and epidemiological information. The expected result is Negative.  Fact Sheet for Patients:  EntrepreneurPulse.com.au  Fact Sheet for Healthcare Providers:  IncredibleEmployment.be  This test is no t yet approved or cleared by the [place] FDA and  has been authorized for detection and/or diagnosis of SARS-CoV-2 by FDA under an Emergency Use  Authorization (EUA). This EUA will remain  in effect (meaning this test can be used) for the duration of the COVID-19 declaration under Section 564(b)(1) of the Act, 21 U.S.C.section 360bbb-3(b)(1), unless the authorization is terminated  or revoked sooner.       Influenza A by PCR NEGATIVE NEGATIVE Final   Influenza B by PCR NEGATIVE NEGATIVE Final    Comment: (NOTE) The Xpert Xpress SARS-CoV-2/FLU/RSV plus assay is intended as an aid in the diagnosis of influenza from Nasopharyngeal swab specimens and should not be used as a sole basis for treatment. Nasal washings and aspirates are unacceptable for Xpert Xpress SARS-CoV-2/FLU/RSV testing.  Fact Sheet for Patients: EntrepreneurPulse.com.au  Fact Sheet for Healthcare Providers: IncredibleEmployment.be  This test is not yet approved or cleared by the [place] FDA and has been authorized for detection and/or diagnosis of SARS-CoV-2 by FDA under an Emergency Use Authorization (EUA). This EUA will remain in effect (meaning this test can be used) for the duration of the COVID-19 declaration under Section 564(b)(1) of the Act, 21 U.S.C. section 360bbb-3(b)(1), unless the authorization is terminated or revoked.  Performed at [facility], [address] [place], [place] [address]      Labs:  COVID-19 Labs  Lab Results  Component Value Date   [place]abel 10/31/2020   [place] NEGATIVE 10/26/2020      Basic Metabolic Panel: Recent Labs  Lab 10/26/20 1307 10/27/20 0438 11/01/20 0608  NA 140 142 139  K 4.0 3.8 4.1  CL 110 108 105  CO2 25 27 26   GLUCOSE 175* 107* 95  BUN 18 18 27*  CREATININE 1.20 0.71 0.84  CALCIUM 8.5* 8.3* 8.6*   Liver Function Tests: Recent Labs  Lab 10/26/20 1307  AST 18  ALT 15  ALKPHOS 61  BILITOT 1.1  PROT 6.2*  ALBUMIN 3.5    CBC: Recent Labs  Lab 10/26/20 1307 10/27/20 0438 11/01/20 0608  WBC 10.2 7.6 6.0   NEUTROABS 7.0  --   --   HGB 16.3 15.3 15.2  HCT 46.5 46.2 43.4  MCV 92.6 92.2 92.1  PLT 122* 100* 116*   Cardiac Enzymes: Recent Labs  Lab 10/26/20 1925  CKTOTAL 68      IMAGING STUDIES CT HEAD WO CONTRAST (5MM)  Result Date: 10/26/2020 CLINICAL DATA:  Cerebral hemorrhage suspected Patient reports fall this morning. EXAM: CT HEAD WITHOUT CONTRAST TECHNIQUE: Contiguous axial images were obtained from the base of the skull through the vertex without intravenous contrast. COMPARISON:  Head CT 02/20/2018 FINDINGS: Brain: Stable degree of atrophy and chronic small vessel ischemia from prior exam. No intracranial hemorrhage, mass effect, or midline shift. No hydrocephalus. The basilar cisterns are patent. No evidence of territorial infarct or acute ischemia. No extra-axial or intracranial fluid collection. Vascular: Atherosclerosis of skullbase vasculature without hyperdense vessel or abnormal calcification. Skull: No fracture or focal lesion. Sinuses/Orbits: Small mucous retention cyst left maxillary sinus no acute findings. No mastoid effusion. Other: None. IMPRESSION: 1. No acute intracranial abnormality. No skull fracture. 2. Stable atrophy and chronic small vessel ischemia. Electronically Signed   By: Keith Rake M.D.   On: 10/26/2020 18:23   CT Cervical Spine Wo Contrast  Result Date: 10/26/2020 CLINICAL DATA:  Neck trauma (Age >=  65y) Fall this morning. EXAM: CT CERVICAL SPINE WITHOUT CONTRAST TECHNIQUE: Multidetector CT imaging of the cervical spine was performed without intravenous contrast. Multiplanar CT image reconstructions were also generated. COMPARISON:  Cervical spine CT 02/20/2018 FINDINGS: Alignment: No traumatic subluxation. Skull base and vertebrae: Interval posterior rod with intrapedicular screw fusion from C4 through T2 fixating prior C7 fracture. Intact hardware. Previous fracture has healed. There is no new or acute fracture. Bulky flowing anterior osteophytes  throughout the cervical spine are not significantly changed. Chronic degenerative change at C1-C2 with partial bony ankylosis, also stable. Intact dens and skull base. Soft tissues and spinal canal: Partially obscured by streak artifact from spinal fusion hardware. There is no prevertebral soft tissue edema. Disc levels: Large flowing anterior osteophytes. No high-grade canal stenosis. Upper chest: Assessed on earlier chest CT, reported separately. No acute apical findings. Other: None. IMPRESSION: 1. No acute fracture or subluxation of the cervical spine. 2. Interval posterior rod with intrapedicular screw fusion from C4 through T2 fixating prior C7 fracture. Previous fracture has healed. Intact hardware. 3. Large flowing anterior osteophytes throughout the cervical spine. Electronically Signed   By: Keith Rake M.D.   On: 10/26/2020 18:28   CT CHEST ABDOMEN PELVIS W CONTRAST  Result Date: 10/26/2020 CLINICAL DATA:  Fall from ramp, pain EXAM: CT CHEST, ABDOMEN, AND PELVIS WITH CONTRAST TECHNIQUE: Multidetector CT imaging of the chest, abdomen and pelvis was performed following the standard protocol during bolus administration of intravenous contrast. CONTRAST:  18mL OMNIPAQUE IOHEXOL 350 MG/ML SOLN COMPARISON:  07/16/2017 FINDINGS: CT CHEST FINDINGS Cardiovascular: Aortic atherosclerosis. Cardiomegaly. Left coronary artery calcifications. Trace pericardial effusion. Mediastinum/Nodes: No enlarged mediastinal, hilar, or axillary lymph nodes. Thyroid gland, trachea, and esophagus demonstrate no significant findings. Lungs/Pleura: Diffuse bilateral bronchial wall thickening with dependent bibasilar scarring and or atelectasis. Multiple small pulmonary nodules are present bilaterally, stable compared to prior examination dated 07/16/2017 and benign, for example a 5 mm nodule of the right middle lobe (series 3, image 74). No pleural effusion or pneumothorax. Musculoskeletal: No chest wall mass or suspicious  bone lesions identified. 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 There is adjacent perivertebral fat stranding (series 2, image 54). CT ABDOMEN PELVIS FINDINGS Hepatobiliary: No focal liver abnormality is seen. Status post cholecystectomy. No biliary dilatation. Pancreas: Unremarkable. No pancreatic ductal dilatation or surrounding inflammatory changes. Spleen: Normal in size without significant abnormality. Adrenals/Urinary Tract: Adrenal glands are unremarkable. Kidneys are normal, without renal calculi, solid lesion, or hydronephrosis. Bladder is unremarkable. Stomach/Bowel: Stomach is within normal limits. Appendix appears normal. No evidence of bowel wall thickening, distention, or inflammatory changes. Vascular/Lymphatic: Aortic atherosclerosis. No enlarged abdominal or pelvic lymph nodes. Reproductive: Prostatomegaly. Other: Small, fat containing bilateral inguinal hernias. No abdominopelvic ascites. Musculoskeletal: No acute or significant osseous findings. IMPRESSION: 1. No CT evidence of acute traumatic injury to the organs of the chest, abdomen, or pelvis. 2. 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 There is adjacent perivertebral fat stranding. No obvious epidural hematoma. 3. Multiple small pulmonary nodules are stable and definitively benign. 4. Prostatomegaly. 5. Coronary artery disease. Aortic Atherosclerosis (ICD10-I70.0). Electronically Signed   By: Eddie Candle M.D.   On: 10/26/2020 14:52    DISCHARGE EXAMINATION: Vitals:   10/31/20 2007 10/31/20 2359 11/01/20 0425 11/01/20 0730  BP: (!) 104/56  117/65 118/63 122/70  Pulse: (!) 55 (!) 58 (!) 53 (!) 58  Resp: 19 19 17 14   Temp: 98 F (36.7 C) 97.7 F (36.5 C) 97.8 F (36.6 C) 98 F (36.7 C)  TempSrc:    Oral  SpO2: 95% 93%  93%   Weight:      Height:       General appearance: Awake alert.  In no distress Resp: Clear to auscultation bilaterally.  Normal effort Cardio: S1-S2 is normal regular.  No S3-S4.  No rubs murmurs or bruit GI: Abdomen is soft.  Nontender nondistended.  Bowel sounds are present normal.  No masses organomegaly Extremities: No edema.  Able to lift both legs off the bed Neurologic: Alert and oriented x3.  No focal neurological deficits.     DISPOSITION: SNF  Discharge Instructions     Call MD for:  difficulty breathing, headache or visual disturbances   Complete by: As directed    Call MD for:  extreme fatigue   Complete by: As directed    Call MD for:  persistant dizziness or light-headedness   Complete by: As directed    Call MD for:  persistant nausea and vomiting   Complete by: As directed    Call MD for:  severe uncontrolled pain   Complete by: As directed    Call MD for:  temperature >100.4   Complete by: As directed    Diet - low sodium heart healthy   Complete by: As directed    Discharge instructions   Complete by: As directed    Please review instructions o the discharge summary.  You were cared for by a hospitalist during your hospital stay. If you have any questions about your discharge medications or the care you received while you were in the hospital after you are discharged, you can call the unit and asked to speak with the hospitalist on call if the hospitalist that took care of you is not available. Once you are discharged, your primary care physician will handle any further medical issues. Please note that NO REFILLS for any discharge medications will be authorized once you are discharged, as it is imperative that you return to your primary care physician (or establish a relationship with a primary care physician if you do not have one) for your aftercare needs so that they can reassess your need for medications and monitor your lab values. If you do not have a  primary care physician, you can call [phone] for a physician referral.   Increase activity slowly   Complete by: As directed          Allergies as of 11/01/2020   No Known Allergies      Medication List     STOP taking these medications    albuterol 108 (90 Base) MCG/ACT inhaler Commonly known as: VENTOLIN HFA Replaced by: albuterol (2.5 MG/3ML) 0.083% nebulizer solution   amLODipine 5 MG tablet Commonly known as: NORVASC   Breo Ellipta 200-25 MCG/INH Aepb Generic drug: fluticasone furoate-vilanterol Replaced by: budesonide-formoterol 160-4.5 MCG/ACT inhaler   cyclobenzaprine 10 MG tablet Commonly known as: FLEXERIL   omeprazole 20 MG capsule Commonly known as: PRILOSEC Replaced by: pantoprazole 40 MG tablet   zolpidem 10 MG tablet Commonly known as: AMBIEN       TAKE these medications    acetaminophen 500 MG tablet Commonly known as: TYLENOL Take 2 tablets (1,000 mg total) by mouth 3 (three) times daily.   albuterol (2.5 MG/3ML) 0.083% nebulizer  solution Commonly known as: PROVENTIL Take 3 mLs by nebulization every 6 (six) hours as needed for wheezing or shortness of breath. Replaces: albuterol 108 (90 Base) MCG/ACT inhaler   bisacodyl 10 MG suppository Commonly known as: DULCOLAX Place 1 suppository (10 mg total) rectally daily as needed for moderate constipation.   budesonide-formoterol 160-4.5 MCG/ACT inhaler Commonly known as: SYMBICORT Inhale 2 puffs into the lungs daily. Replaces: Breo Ellipta 200-25 MCG/INH Aepb   cetirizine 10 MG tablet Commonly known as: ZYRTEC Take 10 mg by mouth at bedtime.   clopidogrel 75 MG tablet Commonly known as: PLAVIX Take 75 mg by mouth daily.   cyanocobalamin 1000 MCG tablet Take 1 tablet (1,000 mcg total) by mouth daily.   dorzolamidel-timolol 22.3-6.8 MG/ML Soln ophthalmic solution Commonly known as: COSOPT Place 1 drop into the right eye 2 (two) times daily.   enoxaparin 60 MG/0.6ML  injection Commonly known as: LOVENOX Inject 0.525 mLs (52.5 mg total) into the skin daily for 7 days. May discontinue once mobility improves   latanoprost 0.005 % ophthalmic solution Commonly known as: XALATAN Place 1 drop into both eyes at bedtime.   losartan 50 MG tablet Commonly known as: COZAAR Take 50 mg by mouth 2 (two) times daily.   melatonin 5 MG Tabs Take 1 tablet (5 mg total) by mouth at bedtime.   methocarbamol 500 MG tablet Commonly known as: ROBAXIN Take 1 tablet (500 mg total) by mouth every 8 (eight) hours as needed for muscle spasms.   metoprolol succinate 50 MG 24 hr tablet Commonly known as: TOPROL-XL Take 1 tablet (50 mg total) by mouth daily. Take with or immediately following a meal. What changed:  medication strength how much to take additional instructions   naproxen 250 MG tablet Commonly known as: NAPROSYN Take 1 tablet (250 mg total) by mouth 2 (two) times daily as needed (for pain not relieved with oxycodone).   oxyCODONE 5 MG immediate release tablet Commonly known as: Oxy IR/ROXICODONE Take 1 tablet (5 mg total) by mouth every 6 (six) hours as needed for breakthrough pain.   pantoprazole 40 MG tablet Commonly known as: PROTONIX Take 1 tablet (40 mg total) by mouth daily. Replaces: omeprazole 20 MG capsule   polyethylene glycol 17 g packet Commonly known as: MIRALAX / GLYCOLAX Take 17 g by mouth daily.   rosuvastatin 10 MG tablet Commonly known as: CRESTOR Take 10 mg by mouth at bedtime.   senna-docusate 8.6-50 MG tablet Commonly known as: Senokot-S Take 2 tablets by mouth at bedtime.   tamsulosin 0.4 MG Caps capsule Commonly known as: FLOMAX Take 2 capsules (0.8 mg total) by mouth daily after supper. What changed:  how much to take when to take this   triamcinolone cream 0.1 % Commonly known as: KENALOG Apply 1 application topically 3 (three) times daily. (Apply to rash on legs)   Vitamin D (Ergocalciferol) 1.25 MG (50000  UNIT) Caps capsule Commonly known as: DRISDOL Take 1 capsule (50,000 Units total) by mouth every 7 (seven) days. Start taking on: November 05, 2020          Contact information for after-discharge care     Destination     [facility] .   Service: Skilled Nursing Contact information: [place] [phone]                     TOTAL DISCHARGE TIME: 35-minute  Bonnielee Haff  [facility] Pager on  [email or web address]  11/01/2020, 9:09 AM

## 2020-11-01 NOTE — TOC Progression Note (Addendum)
Transition of Care [facility]) - Progression Note    Patient Details  Name: Chris Kent MRN: [id number] Date of Birth: 21-Sep-1935  Transition of Care [facility]) CM/SW [place], RN Phone Number: 11/01/2020, 8:50 AM  Clinical Narrative:    The patient has insurance auth to go to [facility] and transport by [employer], He will go to room [address] [place] [address], The bedside nurse to call report to [phone], [facility] to pick up at 1 PM, Daughter June aware   Expected Discharge Plan: [place] Barriers to Discharge: Continued Medical Work up  Expected Discharge Plan and Services Expected Discharge Plan: [place]   Discharge Planning Services: CM Consult Post Acute Care Choice: [place] Living arrangements for the past 2 months: Single Family Home Expected Discharge Date: 10/31/20                                     Social Determinants of Health (SDOH) Interventions    Readmission Risk Interventions No flowsheet data found.

## 2020-11-01 NOTE — Plan of Care (Signed)
Patient discharged per MD orders at this time.All discharge instructions,education and medications was reviewed with patient.Pt expressed understanding and will comply with dc instructions.follow up appointments was also communicated to patient.no verbal c/o or any ssx of distress.Pt was discharged to [facility] for PT/skill nursing per order. Report was called to Ms Dante Gang, admissions nurse before transport.Pt was transported by 2 EMS personnel on a stretcher.

## 2020-11-06 DIAGNOSIS — J449 Chronic obstructive pulmonary disease, unspecified: Secondary | ICD-10-CM | POA: Diagnosis not present

## 2020-11-06 DIAGNOSIS — R296 Repeated falls: Secondary | ICD-10-CM | POA: Diagnosis not present

## 2020-11-06 DIAGNOSIS — I10 Essential (primary) hypertension: Secondary | ICD-10-CM | POA: Diagnosis not present

## 2020-11-06 DIAGNOSIS — N400 Enlarged prostate without lower urinary tract symptoms: Secondary | ICD-10-CM | POA: Diagnosis not present

## 2020-11-06 DIAGNOSIS — M5440 Lumbago with sciatica, unspecified side: Secondary | ICD-10-CM | POA: Diagnosis not present

## 2020-11-13 DIAGNOSIS — M5440 Lumbago with sciatica, unspecified side: Secondary | ICD-10-CM | POA: Diagnosis not present

## 2020-11-13 DIAGNOSIS — N400 Enlarged prostate without lower urinary tract symptoms: Secondary | ICD-10-CM | POA: Diagnosis not present

## 2020-11-13 DIAGNOSIS — J449 Chronic obstructive pulmonary disease, unspecified: Secondary | ICD-10-CM | POA: Diagnosis not present

## 2020-11-13 DIAGNOSIS — I10 Essential (primary) hypertension: Secondary | ICD-10-CM | POA: Diagnosis not present

## 2020-11-17 DIAGNOSIS — R296 Repeated falls: Secondary | ICD-10-CM | POA: Diagnosis not present

## 2020-11-17 DIAGNOSIS — I10 Essential (primary) hypertension: Secondary | ICD-10-CM | POA: Diagnosis not present

## 2020-11-17 DIAGNOSIS — J449 Chronic obstructive pulmonary disease, unspecified: Secondary | ICD-10-CM | POA: Diagnosis not present

## 2020-11-17 DIAGNOSIS — M5440 Lumbago with sciatica, unspecified side: Secondary | ICD-10-CM | POA: Diagnosis not present

## 2020-11-17 DIAGNOSIS — R270 Ataxia, unspecified: Secondary | ICD-10-CM | POA: Diagnosis not present

## 2020-11-17 DIAGNOSIS — N400 Enlarged prostate without lower urinary tract symptoms: Secondary | ICD-10-CM | POA: Diagnosis not present

## 2020-11-20 DIAGNOSIS — N401 Enlarged prostate with lower urinary tract symptoms: Secondary | ICD-10-CM | POA: Diagnosis not present

## 2020-11-20 DIAGNOSIS — Z79891 Long term (current) use of opiate analgesic: Secondary | ICD-10-CM | POA: Diagnosis not present

## 2020-11-20 DIAGNOSIS — R918 Other nonspecific abnormal finding of lung field: Secondary | ICD-10-CM | POA: Diagnosis not present

## 2020-11-20 DIAGNOSIS — E785 Hyperlipidemia, unspecified: Secondary | ICD-10-CM | POA: Diagnosis not present

## 2020-11-20 DIAGNOSIS — W19XXXD Unspecified fall, subsequent encounter: Secondary | ICD-10-CM | POA: Diagnosis not present

## 2020-11-20 DIAGNOSIS — E559 Vitamin D deficiency, unspecified: Secondary | ICD-10-CM | POA: Diagnosis not present

## 2020-11-20 DIAGNOSIS — K2100 Gastro-esophageal reflux disease with esophagitis, without bleeding: Secondary | ICD-10-CM | POA: Diagnosis not present

## 2020-11-20 DIAGNOSIS — Z9181 History of falling: Secondary | ICD-10-CM | POA: Diagnosis not present

## 2020-11-20 DIAGNOSIS — S22082D Unstable burst fracture of T11-T12 vertebra, subsequent encounter for fracture with routine healing: Secondary | ICD-10-CM | POA: Diagnosis not present

## 2020-11-20 DIAGNOSIS — F419 Anxiety disorder, unspecified: Secondary | ICD-10-CM | POA: Diagnosis not present

## 2020-11-20 DIAGNOSIS — I2510 Atherosclerotic heart disease of native coronary artery without angina pectoris: Secondary | ICD-10-CM | POA: Diagnosis not present

## 2020-11-20 DIAGNOSIS — R350 Frequency of micturition: Secondary | ICD-10-CM | POA: Diagnosis not present

## 2020-11-20 DIAGNOSIS — J449 Chronic obstructive pulmonary disease, unspecified: Secondary | ICD-10-CM | POA: Diagnosis not present

## 2020-11-20 DIAGNOSIS — Z7951 Long term (current) use of inhaled steroids: Secondary | ICD-10-CM | POA: Diagnosis not present

## 2020-11-20 DIAGNOSIS — M1990 Unspecified osteoarthritis, unspecified site: Secondary | ICD-10-CM | POA: Diagnosis not present

## 2020-11-20 DIAGNOSIS — G4733 Obstructive sleep apnea (adult) (pediatric): Secondary | ICD-10-CM | POA: Diagnosis not present

## 2020-11-20 DIAGNOSIS — I10 Essential (primary) hypertension: Secondary | ICD-10-CM | POA: Diagnosis not present

## 2020-11-20 DIAGNOSIS — D696 Thrombocytopenia, unspecified: Secondary | ICD-10-CM | POA: Diagnosis not present

## 2020-11-20 DIAGNOSIS — G454 Transient global amnesia: Secondary | ICD-10-CM | POA: Diagnosis not present

## 2020-11-20 DIAGNOSIS — I739 Peripheral vascular disease, unspecified: Secondary | ICD-10-CM | POA: Diagnosis not present

## 2020-11-20 DIAGNOSIS — Z9981 Dependence on supplemental oxygen: Secondary | ICD-10-CM | POA: Diagnosis not present

## 2020-11-20 DIAGNOSIS — M5440 Lumbago with sciatica, unspecified side: Secondary | ICD-10-CM | POA: Diagnosis not present

## 2020-11-20 DIAGNOSIS — Z6833 Body mass index (BMI) 33.0-33.9, adult: Secondary | ICD-10-CM | POA: Diagnosis not present

## 2020-11-20 DIAGNOSIS — E669 Obesity, unspecified: Secondary | ICD-10-CM | POA: Diagnosis not present

## 2020-11-20 DIAGNOSIS — R338 Other retention of urine: Secondary | ICD-10-CM | POA: Diagnosis not present

## 2020-11-20 DIAGNOSIS — R351 Nocturia: Secondary | ICD-10-CM | POA: Diagnosis not present

## 2020-11-20 DIAGNOSIS — H409 Unspecified glaucoma: Secondary | ICD-10-CM | POA: Diagnosis not present

## 2020-11-20 DIAGNOSIS — Z7902 Long term (current) use of antithrombotics/antiplatelets: Secondary | ICD-10-CM | POA: Diagnosis not present

## 2020-11-20 DIAGNOSIS — E538 Deficiency of other specified B group vitamins: Secondary | ICD-10-CM | POA: Diagnosis not present

## 2020-11-20 DIAGNOSIS — S32019D Unspecified fracture of first lumbar vertebra, subsequent encounter for fracture with routine healing: Secondary | ICD-10-CM | POA: Diagnosis not present

## 2020-11-20 DIAGNOSIS — R296 Repeated falls: Secondary | ICD-10-CM | POA: Diagnosis not present

## 2020-11-21 ENCOUNTER — Other Ambulatory Visit: Payer: Self-pay

## 2020-11-21 ENCOUNTER — Other Ambulatory Visit: Payer: Self-pay | Admitting: Family Medicine

## 2020-11-21 ENCOUNTER — Ambulatory Visit
Admission: RE | Admit: 2020-11-21 | Discharge: 2020-11-21 | Disposition: A | Discharge status: Home / Self Care | Payer: PPO | Source: Ambulatory Visit | Attending: Family Medicine | Admitting: Family Medicine

## 2020-11-21 ENCOUNTER — Encounter: Payer: Self-pay | Admitting: *Deleted

## 2020-11-21 ENCOUNTER — Other Ambulatory Visit (HOSPITAL_COMMUNITY): Payer: Self-pay | Admitting: Family Medicine

## 2020-11-21 DIAGNOSIS — W1839XA Other fall on same level, initial encounter: Secondary | ICD-10-CM | POA: Diagnosis present

## 2020-11-21 DIAGNOSIS — J45909 Unspecified asthma, uncomplicated: Secondary | ICD-10-CM | POA: Diagnosis present

## 2020-11-21 DIAGNOSIS — M4314 Spondylolisthesis, thoracic region: Secondary | ICD-10-CM | POA: Diagnosis not present

## 2020-11-21 DIAGNOSIS — N401 Enlarged prostate with lower urinary tract symptoms: Secondary | ICD-10-CM | POA: Diagnosis present

## 2020-11-21 DIAGNOSIS — M549 Dorsalgia, unspecified: Secondary | ICD-10-CM

## 2020-11-21 DIAGNOSIS — I739 Peripheral vascular disease, unspecified: Secondary | ICD-10-CM | POA: Diagnosis present

## 2020-11-21 DIAGNOSIS — Y846 Urinary catheterization as the cause of abnormal reaction of the patient, or of later complication, without mention of misadventure at the time of the procedure: Secondary | ICD-10-CM | POA: Diagnosis not present

## 2020-11-21 DIAGNOSIS — Z8673 Personal history of transient ischemic attack (TIA), and cerebral infarction without residual deficits: Secondary | ICD-10-CM | POA: Diagnosis not present

## 2020-11-21 DIAGNOSIS — Z8042 Family history of malignant neoplasm of prostate: Secondary | ICD-10-CM | POA: Diagnosis not present

## 2020-11-21 DIAGNOSIS — K219 Gastro-esophageal reflux disease without esophagitis: Secondary | ICD-10-CM | POA: Diagnosis present

## 2020-11-21 DIAGNOSIS — Z20822 Contact with and (suspected) exposure to covid-19: Secondary | ICD-10-CM | POA: Diagnosis present

## 2020-11-21 DIAGNOSIS — S22082A Unstable burst fracture of T11-T12 vertebra, initial encounter for closed fracture: Secondary | ICD-10-CM | POA: Diagnosis present

## 2020-11-21 DIAGNOSIS — E785 Hyperlipidemia, unspecified: Secondary | ICD-10-CM | POA: Diagnosis present

## 2020-11-21 DIAGNOSIS — N390 Urinary tract infection, site not specified: Secondary | ICD-10-CM | POA: Diagnosis not present

## 2020-11-21 DIAGNOSIS — Z96653 Presence of artificial knee joint, bilateral: Secondary | ICD-10-CM | POA: Diagnosis present

## 2020-11-21 DIAGNOSIS — S22089D Unspecified fracture of T11-T12 vertebra, subsequent encounter for fracture with routine healing: Secondary | ICD-10-CM | POA: Diagnosis not present

## 2020-11-21 DIAGNOSIS — I10 Essential (primary) hypertension: Secondary | ICD-10-CM | POA: Diagnosis present

## 2020-11-21 DIAGNOSIS — S22089A Unspecified fracture of T11-T12 vertebra, initial encounter for closed fracture: Secondary | ICD-10-CM | POA: Diagnosis not present

## 2020-11-21 DIAGNOSIS — S22080A Wedge compression fracture of T11-T12 vertebra, initial encounter for closed fracture: Secondary | ICD-10-CM | POA: Diagnosis not present

## 2020-11-21 DIAGNOSIS — M545 Low back pain, unspecified: Secondary | ICD-10-CM | POA: Diagnosis not present

## 2020-11-21 DIAGNOSIS — Z7902 Long term (current) use of antithrombotics/antiplatelets: Secondary | ICD-10-CM

## 2020-11-21 DIAGNOSIS — M454 Ankylosing spondylitis of thoracic region: Secondary | ICD-10-CM | POA: Diagnosis present

## 2020-11-21 DIAGNOSIS — S22082G Unstable burst fracture of T11-T12 vertebra, subsequent encounter for fracture with delayed healing: Secondary | ICD-10-CM | POA: Diagnosis not present

## 2020-11-21 DIAGNOSIS — R296 Repeated falls: Secondary | ICD-10-CM | POA: Diagnosis not present

## 2020-11-21 DIAGNOSIS — Y92008 Other place in unspecified non-institutional (private) residence as the place of occurrence of the external cause: Secondary | ICD-10-CM | POA: Diagnosis not present

## 2020-11-21 DIAGNOSIS — M4324 Fusion of spine, thoracic region: Secondary | ICD-10-CM | POA: Diagnosis not present

## 2020-11-21 DIAGNOSIS — J449 Chronic obstructive pulmonary disease, unspecified: Secondary | ICD-10-CM | POA: Diagnosis not present

## 2020-11-21 DIAGNOSIS — Z79899 Other long term (current) drug therapy: Secondary | ICD-10-CM | POA: Diagnosis not present

## 2020-11-21 DIAGNOSIS — T83518A Infection and inflammatory reaction due to other urinary catheter, initial encounter: Secondary | ICD-10-CM | POA: Diagnosis not present

## 2020-11-21 DIAGNOSIS — G4733 Obstructive sleep apnea (adult) (pediatric): Secondary | ICD-10-CM | POA: Diagnosis not present

## 2020-11-21 DIAGNOSIS — Z9981 Dependence on supplemental oxygen: Secondary | ICD-10-CM

## 2020-11-21 DIAGNOSIS — Z23 Encounter for immunization: Secondary | ICD-10-CM

## 2020-11-21 DIAGNOSIS — Z7951 Long term (current) use of inhaled steroids: Secondary | ICD-10-CM | POA: Diagnosis not present

## 2020-11-21 DIAGNOSIS — I2510 Atherosclerotic heart disease of native coronary artery without angina pectoris: Secondary | ICD-10-CM | POA: Diagnosis present

## 2020-11-21 DIAGNOSIS — M532X4 Spinal instabilities, thoracic region: Secondary | ICD-10-CM | POA: Diagnosis not present

## 2020-11-21 DIAGNOSIS — Z09 Encounter for follow-up examination after completed treatment for conditions other than malignant neoplasm: Secondary | ICD-10-CM | POA: Diagnosis not present

## 2020-11-21 DIAGNOSIS — F419 Anxiety disorder, unspecified: Secondary | ICD-10-CM | POA: Diagnosis present

## 2020-11-21 DIAGNOSIS — E559 Vitamin D deficiency, unspecified: Secondary | ICD-10-CM | POA: Diagnosis not present

## 2020-11-21 DIAGNOSIS — H409 Unspecified glaucoma: Secondary | ICD-10-CM | POA: Diagnosis not present

## 2020-11-21 DIAGNOSIS — B952 Enterococcus as the cause of diseases classified elsewhere: Secondary | ICD-10-CM | POA: Diagnosis present

## 2020-11-21 NOTE — ED Notes (Signed)
Reviewed pt's cc and results with Dr Kerman Passey; no further orders given at this time

## 2020-11-21 NOTE — ED Triage Notes (Signed)
Pt brought in by step son.  Pt had a ctscan today with abnormal findings.  Pt told to come to er for eval.  Pt has back pain with movement.  Pt alert   speech clear

## 2020-11-22 ENCOUNTER — Emergency Department: Payer: PPO

## 2020-11-22 ENCOUNTER — Inpatient Hospital Stay
Admission: EM | Admit: 2020-11-22 | Discharge: 2020-11-30 | DRG: 517 | Disposition: A | Discharge status: Home Health | Payer: PPO | Attending: Hospitalist | Admitting: Hospitalist

## 2020-11-22 DIAGNOSIS — S22089A Unspecified fracture of T11-T12 vertebra, initial encounter for closed fracture: Secondary | ICD-10-CM | POA: Diagnosis not present

## 2020-11-22 DIAGNOSIS — N390 Urinary tract infection, site not specified: Secondary | ICD-10-CM | POA: Diagnosis not present

## 2020-11-22 DIAGNOSIS — N401 Enlarged prostate with lower urinary tract symptoms: Secondary | ICD-10-CM | POA: Diagnosis present

## 2020-11-22 DIAGNOSIS — S22082G Unstable burst fracture of T11-T12 vertebra, subsequent encounter for fracture with delayed healing: Secondary | ICD-10-CM | POA: Diagnosis not present

## 2020-11-22 DIAGNOSIS — S22009A Unspecified fracture of unspecified thoracic vertebra, initial encounter for closed fracture: Secondary | ICD-10-CM | POA: Diagnosis present

## 2020-11-22 DIAGNOSIS — B952 Enterococcus as the cause of diseases classified elsewhere: Secondary | ICD-10-CM | POA: Diagnosis present

## 2020-11-22 DIAGNOSIS — Y92008 Other place in unspecified non-institutional (private) residence as the place of occurrence of the external cause: Secondary | ICD-10-CM | POA: Diagnosis not present

## 2020-11-22 DIAGNOSIS — Z419 Encounter for procedure for purposes other than remedying health state, unspecified: Secondary | ICD-10-CM

## 2020-11-22 DIAGNOSIS — I10 Essential (primary) hypertension: Secondary | ICD-10-CM | POA: Diagnosis present

## 2020-11-22 DIAGNOSIS — Z8673 Personal history of transient ischemic attack (TIA), and cerebral infarction without residual deficits: Secondary | ICD-10-CM

## 2020-11-22 DIAGNOSIS — N400 Enlarged prostate without lower urinary tract symptoms: Secondary | ICD-10-CM | POA: Diagnosis present

## 2020-11-22 DIAGNOSIS — S22082A Unstable burst fracture of T11-T12 vertebra, initial encounter for closed fracture: Secondary | ICD-10-CM | POA: Diagnosis present

## 2020-11-22 DIAGNOSIS — Z79899 Other long term (current) drug therapy: Secondary | ICD-10-CM | POA: Diagnosis not present

## 2020-11-22 DIAGNOSIS — K219 Gastro-esophageal reflux disease without esophagitis: Secondary | ICD-10-CM | POA: Diagnosis present

## 2020-11-22 DIAGNOSIS — J45909 Unspecified asthma, uncomplicated: Secondary | ICD-10-CM | POA: Diagnosis present

## 2020-11-22 DIAGNOSIS — Z20822 Contact with and (suspected) exposure to covid-19: Secondary | ICD-10-CM | POA: Diagnosis present

## 2020-11-22 DIAGNOSIS — Z7951 Long term (current) use of inhaled steroids: Secondary | ICD-10-CM | POA: Diagnosis not present

## 2020-11-22 DIAGNOSIS — E785 Hyperlipidemia, unspecified: Secondary | ICD-10-CM | POA: Diagnosis present

## 2020-11-22 DIAGNOSIS — Z23 Encounter for immunization: Secondary | ICD-10-CM | POA: Diagnosis not present

## 2020-11-22 DIAGNOSIS — F419 Anxiety disorder, unspecified: Secondary | ICD-10-CM | POA: Diagnosis present

## 2020-11-22 DIAGNOSIS — Z8042 Family history of malignant neoplasm of prostate: Secondary | ICD-10-CM | POA: Diagnosis not present

## 2020-11-22 DIAGNOSIS — M454 Ankylosing spondylitis of thoracic region: Secondary | ICD-10-CM | POA: Diagnosis present

## 2020-11-22 DIAGNOSIS — I2510 Atherosclerotic heart disease of native coronary artery without angina pectoris: Secondary | ICD-10-CM | POA: Diagnosis present

## 2020-11-22 DIAGNOSIS — G4733 Obstructive sleep apnea (adult) (pediatric): Secondary | ICD-10-CM | POA: Diagnosis not present

## 2020-11-22 DIAGNOSIS — Z9981 Dependence on supplemental oxygen: Secondary | ICD-10-CM | POA: Diagnosis not present

## 2020-11-22 DIAGNOSIS — T83518A Infection and inflammatory reaction due to other urinary catheter, initial encounter: Secondary | ICD-10-CM | POA: Diagnosis not present

## 2020-11-22 DIAGNOSIS — W1839XA Other fall on same level, initial encounter: Secondary | ICD-10-CM | POA: Diagnosis present

## 2020-11-22 DIAGNOSIS — Z981 Arthrodesis status: Secondary | ICD-10-CM

## 2020-11-22 DIAGNOSIS — I739 Peripheral vascular disease, unspecified: Secondary | ICD-10-CM | POA: Diagnosis present

## 2020-11-22 DIAGNOSIS — Y846 Urinary catheterization as the cause of abnormal reaction of the patient, or of later complication, without mention of misadventure at the time of the procedure: Secondary | ICD-10-CM | POA: Diagnosis not present

## 2020-11-22 DIAGNOSIS — Z7902 Long term (current) use of antithrombotics/antiplatelets: Secondary | ICD-10-CM | POA: Diagnosis not present

## 2020-11-22 DIAGNOSIS — Z96653 Presence of artificial knee joint, bilateral: Secondary | ICD-10-CM | POA: Diagnosis present

## 2020-11-22 LAB — RESP PANEL BY RT-PCR (FLU A&B, COVID) ARPGX2
Influenza A by PCR: NEGATIVE
Influenza B by PCR: NEGATIVE
SARS Coronavirus 2 by RT PCR: NEGATIVE

## 2020-11-22 LAB — CBC
HCT: 46.9 % (ref 39.0–52.0)
Hemoglobin: 15.7 g/dL (ref 13.0–17.0)
MCH: 31.8 pg (ref 26.0–34.0)
MCHC: 33.5 g/dL (ref 30.0–36.0)
MCV: 95.1 fL (ref 80.0–100.0)
Platelets: 146 10*3/uL — ABNORMAL LOW (ref 150–400)
RBC: 4.93 MIL/uL (ref 4.22–5.81)
RDW: 15 % (ref 11.5–15.5)
WBC: 9.9 10*3/uL (ref 4.0–10.5)
nRBC: 0 % (ref 0.0–0.2)

## 2020-11-22 LAB — BASIC METABOLIC PANEL
Anion gap: 5 (ref 5–15)
BUN: 21 mg/dL (ref 8–23)
CO2: 30 mmol/L (ref 22–32)
Calcium: 8.9 mg/dL (ref 8.9–10.3)
Chloride: 108 mmol/L (ref 98–111)
Creatinine, Ser: 0.82 mg/dL (ref 0.61–1.24)
GFR, Estimated: >60 mL/min (ref >60)
Glucose, Bld: 100 mg/dL — ABNORMAL HIGH (ref 70–99)
Potassium: 4.1 mmol/L (ref 3.5–5.1)
Sodium: 143 mmol/L (ref 135–145)

## 2020-11-22 LAB — BRAIN NATRIURETIC PEPTIDE: B Natriuretic Peptide: 36.8 pg/mL (ref 0.0–100.0)

## 2020-11-22 LAB — TYPE AND SCREEN
ABO/RH(D): A POS
Antibody Screen: NEGATIVE

## 2020-11-22 LAB — PROTIME-INR
INR: 1.1 (ref 0.8–1.2)
Prothrombin Time: 13.9 seconds (ref 11.4–15.2)

## 2020-11-22 LAB — APTT: aPTT: 25 seconds (ref 24–36)

## 2020-11-22 MED ORDER — MORPHINE SULFATE (PF) 2 MG/ML IV SOLN
0.5000 mg | INTRAVENOUS | Status: DC | PRN
  Administered 2020-11-23: 0.5 mg via INTRAVENOUS
  Filled 2020-11-22: qty 1

## 2020-11-22 MED ORDER — LATANOPROST 0.005 % OP SOLN
1.0000 [drp] | Freq: Every day | OPHTHALMIC | Status: DC
  Administered 2020-11-22 – 2020-11-29 (×6): 1 [drp] via OPHTHALMIC
  Filled 2020-11-22 (×3): qty 2.5

## 2020-11-22 MED ORDER — IPRATROPIUM-ALBUTEROL 0.5-2.5 (3) MG/3ML IN SOLN
3.0000 mL | Freq: Four times a day (QID) | RESPIRATORY_TRACT | Status: DC
  Administered 2020-11-22: 3 mL via RESPIRATORY_TRACT
  Filled 2020-11-22: qty 3

## 2020-11-22 MED ORDER — PNEUMOCOCCAL VAC POLYVALENT 25 MCG/0.5ML IJ INJ
0.5000 mL | INJECTION | INTRAMUSCULAR | Status: AC
  Administered 2020-11-23: 0.5 mL via INTRAMUSCULAR
  Filled 2020-11-22: qty 0.5

## 2020-11-22 MED ORDER — HYDRALAZINE HCL 20 MG/ML IJ SOLN: 5.0000 mg | INTRAMUSCULAR | Status: DC | PRN

## 2020-11-22 MED ORDER — MOMETASONE FURO-FORMOTEROL FUM 200-5 MCG/ACT IN AERO
2.0000 | INHALATION_SPRAY | Freq: Two times a day (BID) | RESPIRATORY_TRACT | Status: DC
Start: 2020-11-22 — End: 2020-11-30
  Administered 2020-11-22 – 2020-11-30 (×15): 2 via RESPIRATORY_TRACT
  Filled 2020-11-22: qty 8.8

## 2020-11-22 MED ORDER — FENTANYL CITRATE PF 50 MCG/ML IJ SOSY
50.0000 ug | PREFILLED_SYRINGE | INTRAMUSCULAR | Status: DC | PRN
  Administered 2020-11-22 – 2020-11-27 (×6): 50 ug via INTRAVENOUS
  Filled 2020-11-22 (×4): qty 1

## 2020-11-22 MED ORDER — LORATADINE 10 MG PO TABS
10.0000 mg | ORAL_TABLET | Freq: Every day | ORAL | Status: DC
  Administered 2020-11-22 – 2020-11-30 (×9): 10 mg via ORAL
  Filled 2020-11-22 (×9): qty 1

## 2020-11-22 MED ORDER — BISACODYL 10 MG RE SUPP: 10.0000 mg | Freq: Every day | RECTAL | Status: DC | PRN

## 2020-11-22 MED ORDER — LACTATED RINGERS IV SOLN: INTRAVENOUS | Status: DC

## 2020-11-22 MED ORDER — ALBUTEROL SULFATE (2.5 MG/3ML) 0.083% IN NEBU
2.5000 mg | INHALATION_SOLUTION | RESPIRATORY_TRACT | Status: DC | PRN
  Administered 2020-11-28: 2.5 mg via RESPIRATORY_TRACT
  Filled 2020-11-22: qty 3

## 2020-11-22 MED ORDER — LOSARTAN POTASSIUM 50 MG PO TABS
50.0000 mg | ORAL_TABLET | Freq: Two times a day (BID) | ORAL | Status: DC
  Administered 2020-11-22 – 2020-11-30 (×15): 50 mg via ORAL
  Filled 2020-11-22 (×15): qty 1

## 2020-11-22 MED ORDER — TAMSULOSIN HCL 0.4 MG PO CAPS
0.8000 mg | ORAL_CAPSULE | Freq: Every day | ORAL | Status: DC
  Administered 2020-11-22 – 2020-11-29 (×7): 0.8 mg via ORAL
  Filled 2020-11-22 (×8): qty 2

## 2020-11-22 MED ORDER — ROSUVASTATIN CALCIUM 10 MG PO TABS
10.0000 mg | ORAL_TABLET | Freq: Every day | ORAL | Status: DC
  Administered 2020-11-22 – 2020-11-29 (×7): 10 mg via ORAL
  Filled 2020-11-22 (×7): qty 1

## 2020-11-22 MED ORDER — DM-GUAIFENESIN ER 30-600 MG PO TB12
1.0000 | ORAL_TABLET | Freq: Two times a day (BID) | ORAL | Status: DC | PRN
  Filled 2020-11-22: qty 1

## 2020-11-22 MED ORDER — ZOLPIDEM TARTRATE 5 MG PO TABS: 10.0000 mg | ORAL_TABLET | Freq: Every evening | ORAL | Status: DC | PRN

## 2020-11-22 MED ORDER — OXYCODONE-ACETAMINOPHEN 5-325 MG PO TABS
1.0000 | ORAL_TABLET | ORAL | Status: DC | PRN
  Administered 2020-11-23 – 2020-11-26 (×8): 1 via ORAL
  Filled 2020-11-22 (×9): qty 1

## 2020-11-22 MED ORDER — DORZOLAMIDE HCL-TIMOLOL MAL PF 22.3-6.8 MG/ML OP SOLN
1.0000 [drp] | Freq: Two times a day (BID) | OPHTHALMIC | Status: DC
  Administered 2020-11-22 – 2020-11-30 (×15): 1 [drp] via OPHTHALMIC
  Filled 2020-11-22 (×9): qty 0.1

## 2020-11-22 MED ORDER — FENTANYL CITRATE PF 50 MCG/ML IJ SOSY
50.0000 ug | PREFILLED_SYRINGE | Freq: Once | INTRAMUSCULAR | Status: AC
  Administered 2020-11-22: 50 ug via INTRAVENOUS
  Filled 2020-11-22: qty 1

## 2020-11-22 MED ORDER — IPRATROPIUM-ALBUTEROL 0.5-2.5 (3) MG/3ML IN SOLN: 3.0000 mL | Freq: Four times a day (QID) | RESPIRATORY_TRACT | Status: DC | PRN

## 2020-11-22 MED ORDER — FENTANYL CITRATE PF 50 MCG/ML IJ SOSY
PREFILLED_SYRINGE | INTRAMUSCULAR | Status: AC
  Filled 2020-11-22: qty 1

## 2020-11-22 MED ORDER — ACETAMINOPHEN 325 MG PO TABS: 650.0000 mg | ORAL_TABLET | Freq: Four times a day (QID) | ORAL | Status: DC | PRN

## 2020-11-22 MED ORDER — METHOCARBAMOL 500 MG PO TABS
500.0000 mg | ORAL_TABLET | Freq: Three times a day (TID) | ORAL | Status: DC | PRN
  Administered 2020-11-22 – 2020-11-28 (×7): 500 mg via ORAL
  Filled 2020-11-22 (×8): qty 1

## 2020-11-22 MED ORDER — ZOLPIDEM TARTRATE 5 MG PO TABS
5.0000 mg | ORAL_TABLET | Freq: Every evening | ORAL | Status: DC | PRN
  Filled 2020-11-22: qty 1

## 2020-11-22 MED ORDER — METOPROLOL SUCCINATE ER 50 MG PO TB24
50.0000 mg | ORAL_TABLET | Freq: Every day | ORAL | Status: DC
  Administered 2020-11-22 – 2020-11-30 (×9): 50 mg via ORAL
  Filled 2020-11-22 (×9): qty 1

## 2020-11-22 MED ORDER — LACTATED RINGERS IV BOLUS: 500.0000 mL | Freq: Once | INTRAVENOUS | Status: DC

## 2020-11-22 MED ORDER — ONDANSETRON HCL 4 MG/2ML IJ SOLN: 4.0000 mg | Freq: Three times a day (TID) | INTRAMUSCULAR | Status: DC | PRN

## 2020-11-22 MED ORDER — ONDANSETRON HCL 4 MG/2ML IJ SOLN
4.0000 mg | Freq: Once | INTRAMUSCULAR | Status: AC
  Administered 2020-11-22: 4 mg via INTRAVENOUS
  Filled 2020-11-22: qty 2

## 2020-11-22 NOTE — ED Provider Notes (Signed)
[facility] Emergency Department Provider Note  ____________________________________________  Time seen: Approximately 1:50 AM  I have reviewed the triage vital signs and the nursing notes.   HISTORY  Chief Complaint No chief complaint on file.   HPI Chris Kent is a 85 y.o. male with a history of hypertension, hyperlipidemia, peripheral vascular disease on Plavix, OSA, glaucoma, asthma who presents for evaluation of an abnormal outpatient CT.  Patient is here accompanied by his granddaughter.  According to them, patient had a fall almost a month ago.  Came to the emergency room was found to have an acute T11 fracture.  He was admitted for pain control and seen by PT OT.  He was sent to rehab.  Today followed up with his primary care doctor after being sent home from rehab 5 days ago due to persistent pain.  According to the family, patient was very independent and took care of his entire yard prior to the fall.  Now he can barely walk due to the severity of the pain.  The pain is located in the middle of his back, 5 out of 10 at rest and sitting down and 10 out of 10 with movement or ambulation.  Patient denies weakness or numbness of his lower extremities, saddle anesthesia, urinary or bowel incontinence or retention.  His primary care doctor ordered a CT of the lumbar and thoracic spine which showed an unstable T11 fracture and therefore patient was sent here for evaluation.   Past Medical History:  Diagnosis Date   Anxiety    Arthritis    Asthma    GERD (gastroesophageal reflux disease)    Glaucoma    Glaucoma    History of stroke    Hyperlipidemia    Hyperlipidemia    Hypertension    Peripheral vascular disease ([place])    Sleep apnea     Patient Active Problem List   Diagnosis Date Noted   Fall 10/26/2020   Erectile dysfunction due to arterial insufficiency 06/29/2019   Nephrolithiasis 06/25/2018   Anxiety 11/21/2017   Asthma without status  asthmaticus 11/21/2017   Esophagitis 11/21/2017   Glaucoma 11/21/2017   History of colonic polyps 11/21/2017   Hyperglycemia 11/21/2017   Hypertension 11/21/2017   Osteoarthritis 11/21/2017   Thyroid nodule 11/21/2017   Transient global amnesia 11/21/2017   Status post total bilateral knee replacement 01/12/2017   Essential hypertension, benign 03/05/2016   Hyperlipidemia 03/05/2016   Carotid stenosis 03/05/2016   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   Thrombocytopenia ([place]) 03/03/2014   Obstructive sleep apnea hypopnea, moderate 06/14/2013   Pulmonary nodules/lesions, multiple 06/14/2013   Renal cyst, acquired 04/06/2013   Enlarged prostate with lower urinary tract symptoms (LUTS) 03/18/2012   Erectile dysfunction 03/18/2012   Increased frequency of urination 03/18/2012   Nocturia 03/18/2012    Past Surgical History:  Procedure Laterality Date   REPLACEMENT TOTAL KNEE BILATERAL      Prior to Admission medications   Medication Sig Start Date End Date Taking? Authorizing Provider  acetaminophen (TYLENOL) 500 MG tablet Take 2 tablets (1,000 mg total) by mouth 3 (three) times daily. 10/31/20   Bonnielee Haff, MD  albuterol (PROVENTIL) (2.5 MG/3ML) 0.083% nebulizer solution Take 3 mLs by nebulization every 6 (six) hours as needed for wheezing or shortness of breath. 10/31/20   Bonnielee Haff, MD  bisacodyl (DULCOLAX) 10 MG suppository Place 1 suppository (10 mg total) rectally daily as needed for moderate constipation. 10/31/20   Bonnielee Haff, MD  budesonide-formoterol [facility]) 160-4.5  MCG/ACT inhaler Inhale 2 puffs into the lungs daily. 10/31/20   Bonnielee Haff, MD  cetirizine (ZYRTEC) 10 MG tablet Take 10 mg by mouth at bedtime.    [provider]  clopidogrel (PLAVIX) 75 MG tablet Take 75 mg by mouth daily.    [provider]  Dorzolamide HCl-Timolol Mal PF 22.3-6.8 MG/ML SOLN Place 1 drop into the right eye 2 (two) times daily.    [provider]   enoxaparin (LOVENOX) 60 MG/0.6ML injection Inject 0.525 mLs (52.5 mg total) into the skin daily for 7 days. May discontinue once mobility improves 10/31/20 11/07/20  Bonnielee Haff, MD  latanoprost (XALATAN) 0.005 % ophthalmic solution Place 1 drop into both eyes at bedtime.    [provider]  losartan (COZAAR) 50 MG tablet Take 50 mg by mouth 2 (two) times daily.    [provider]  melatonin 5 MG TABS Take 1 tablet (5 mg total) by mouth at bedtime. 10/31/20   Bonnielee Haff, MD  methocarbamol (ROBAXIN) 500 MG tablet Take 1 tablet (500 mg total) by mouth every 8 (eight) hours as needed for muscle spasms. 10/31/20   Bonnielee Haff, MD  metoprolol succinate (TOPROL-XL) 50 MG 24 hr tablet Take 1 tablet (50 mg total) by mouth daily. Take with or immediately following a meal. 11/01/20   Bonnielee Haff, MD  naproxen (NAPROSYN) 250 MG tablet Take 1 tablet (250 mg total) by mouth 2 (two) times daily as needed (for pain not relieved with oxycodone). 10/31/20   Bonnielee Haff, MD  oxyCODONE (OXY IR/ROXICODONE) 5 MG immediate release tablet Take 1 tablet (5 mg total) by mouth every 6 (six) hours as needed for breakthrough pain. 10/31/20   Bonnielee Haff, MD  pantoprazole (PROTONIX) 40 MG tablet Take 1 tablet (40 mg total) by mouth daily. 10/31/20   Bonnielee Haff, MD  polyethylene glycol (MIRALAX / GLYCOLAX) 17 g packet Take 17 g by mouth daily. 10/31/20   Bonnielee Haff, MD  rosuvastatin (CRESTOR) 10 MG tablet Take 10 mg by mouth at bedtime.    [provider]  senna-docusate (SENOKOT-S) 8.6-50 MG tablet Take 2 tablets by mouth at bedtime. 10/31/20   Bonnielee Haff, MD  tamsulosin (FLOMAX) 0.4 MG CAPS capsule Take 2 capsules (0.8 mg total) by mouth daily after supper. 10/31/20   Bonnielee Haff, MD  triamcinolone cream (KENALOG) 0.1 % Apply 1 application topically 3 (three) times daily. (Apply to rash on legs)    [provider]  vitamin B-12 1000 MCG tablet Take 1 tablet  (1,000 mcg total) by mouth daily. 10/31/20   Bonnielee Haff, MD  Vitamin D, Ergocalciferol, (DRISDOL) 1.25 MG (50000 UNIT) CAPS capsule Take 1 capsule (50,000 Units total) by mouth every 7 (seven) days. 11/05/20   Bonnielee Haff, MD    Allergies Patient has no known allergies.  Family History  Problem Relation Age of Onset   Prostate cancer Father     Social History Social History   Tobacco Use   Smoking status: Never   Smokeless tobacco: Never  Vaping Use   Vaping Use: Never used  Substance Use Topics   Alcohol use: No   Drug use: No    Review of Systems  Constitutional: Negative for fever. Eyes: Negative for visual changes. ENT: Negative for sore throat. Neck: No neck pain  Cardiovascular: Negative for chest pain. Respiratory: Negative for shortness of breath. Gastrointestinal: Negative for abdominal pain, vomiting or diarrhea. Genitourinary: Negative for dysuria. Musculoskeletal: + back pain. Skin: Negative for rash.  Neurological: Negative for headaches, weakness or numbness. Psych: No SI or HI  ____________________________________________   PHYSICAL EXAM:  VITAL SIGNS: ED Triage Vitals  Enc Vitals Group     BP 11/21/20 1727 (!) 144/72     Pulse Rate 11/21/20 1727 79     Resp 11/21/20 1727 20     Temp 11/21/20 1727 98.2 F (36.8 C)     Temp Source 11/21/20 1727 Oral     SpO2 11/21/20 1727 100 %     Weight 11/21/20 1725 225 lb (102.1 kg)     Height 11/21/20 1725 5\' 9"  (1.753 m)     Head Circumference --      Peak Flow --      Pain Score 11/21/20 1725 4     Pain Loc --      Pain Edu? --      Excl. in [place]? --     Constitutional: Alert and oriented. Well appearing and in no apparent distress. HEENT:      Head: Normocephalic and atraumatic.         Eyes: Conjunctivae are normal. Sclera is non-icteric.       Mouth/Throat: Mucous membranes are moist.       Neck: Supple with no signs of meningismus. Cardiovascular: Regular rate and rhythm. No murmurs,  gallops, or rubs. 2+ symmetrical distal pulses are present in all extremities. No JVD. Respiratory: Normal respiratory effort. Lungs are clear to auscultation bilaterally.  Gastrointestinal: Soft, non tender, and non distended with positive bowel sounds. No rebound or guarding. Genitourinary: No CVA tenderness. Musculoskeletal:  No edema, cyanosis, or erythema of extremities. Midline ttp over the lower thoracic spine Neurologic: Normal speech and language. Face is symmetric. Intact strength and sensation x4.  2+ patellar DTR bilaterally Skin: Skin is warm, dry and intact. No rash noted. Psychiatric: Mood and affect are normal. Speech and behavior are normal.  ____________________________________________   LABS (all labs ordered are listed, but only abnormal results are displayed)  Labs Reviewed  RESP PANEL BY RT-PCR (FLU A&B, COVID) ARPGX2  CBC  BASIC METABOLIC PANEL  PROTIME-INR  APTT  TYPE AND SCREEN   ____________________________________________  EKG  none  ____________________________________________  RADIOLOGY  I have personally reviewed the images performed during this visit and I agree with the Radiologist's read.   Interpretation by Radiologist:  CT THORACIC SPINE WO CONTRAST  Result Date: 11/21/2020 CLINICAL DATA:  Thoracic region back pain. EXAM: CT THORACIC SPINE WITHOUT CONTRAST TECHNIQUE: Multidetector CT images of the thoracic were obtained using the standard protocol without intravenous contrast. COMPARISON:  Chest CT 10/26/2020 FINDINGS: Alignment: Normal except for 2 mm of anterolisthesis at the T11 level related to the fracture described below. Vertebrae: Solid ankylosis from the lower cervical region 2 T11. Previous posterior fusion procedure in the cervical and upper thoracic region to the level of T2. There is an unstable fracture of the T11 level involving the posterior elements and inferior vertebral body. There is anterolisthesis of 2 mm. T12 and L1  appear normal. Paraspinal and other soft tissues: Negative Disc levels: No evidence of compressive canal or foraminal stenosis. IMPRESSION: Background pattern of ankylosis throughout the region consistent with the diagnosis of ankylosing spondylitis. Fracture at T11 affecting the posterior elements and vertebral body with anterolisthesis of 2 mm. This would be an unstable or potentially unstable fracture. Call report in progress. Electronically Signed   By: Nelson Chimes M.D.   On: 11/21/2020 15:55   CT LUMBAR SPINE WO CONTRAST  Result Date: 11/21/2020 CLINICAL DATA:  Back and lumbar region pain EXAM: CT LUMBAR SPINE WITHOUT CONTRAST TECHNIQUE: Multidetector CT imaging of the lumbar spine was performed without intravenous contrast administration. Multiplanar CT image reconstructions were also generated. COMPARISON:  None. FINDINGS: Segmentation: 5 lumbar type vertebral bodies. Alignment: No malalignment. Vertebrae and disc levels: Ankylosis throughout the region at each intervertebral level and complete ankylosis of the sacroiliac joints, typical of ankylosing spondylitis. No evidence of regional fracture. There is chronic bony foraminal stenosis on the right at C2-3. Other foramina appear sufficiently patent. No canal stenosis. Paraspinal and other soft tissues: Negative other than aortic atherosclerosis without visible aneurysm. IMPRESSION: Chronic findings of ankylosing spondylitis with solid ankylosis throughout the region, including both sacroiliac joints. No acute or traumatic finding. 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 No other compressive canal or foraminal narrowing suspected. Electronically Signed   By: Nelson Chimes M.D.   On: 11/21/2020 15:51   MR THORACIC SPINE WO CONTRAST  Result Date: 11/22/2020 CLINICAL DATA:  Spine fracture, traumatic EXAM: MRI THORACIC SPINE WITHOUT CONTRAST TECHNIQUE: Multiplanar, multisequence MR imaging of the thoracic spine was  performed. No intravenous contrast was administered. COMPARISON:  11/21/2020 CT thoracic spine FINDINGS: Evaluation is somewhat limited by inability to cross reference from the axial to sagittal series, as the localizer had to be re-run. Alignment: Physiologic. Vertebrae: Decreased T1 signal and increased T2 signal at the inferior aspect of T11 and the superior aspect of T12, concerning for acute fractures. The T11 fracture extends into the bilateral posterior elements. The T12 fracture does not appear to involve the posterior elements. Increased T2 signal is also seen at the posteroinferior aspect of L1. No evidence of retropulsion of fracture fragments. Cord: Normal signal and morphology. No evidence of cord compression or epidural hematoma. Paraspinal and other soft tissues: Negative. Disc levels: No high-grade spinal canal stenosis. Neural foraminal narrowing at T11-T12 bilaterally, secondary to facet fracture. IMPRESSION: 1. Increased T2 signal at the inferior aspect of T11, extending into the bilateral posterior elements, concerning for 3 column injury. 2. Increased T2 signal at the superior aspect of T12 and at the inferior posterior aspect of L1, also concerning for acute fractures, without involvement of the posterior elements. 3. No evidence of retropulsion, cord compression, or epidural hematoma. These results were called by telephone at the time of interpretation on 11/22/2020 at 4:02 am to provider [facility] , who verbally acknowledged these results. Electronically Signed   By: Merilyn Baba M.D.   On: 11/22/2020 04:04     ____________________________________________   PROCEDURES  Procedure(s) performed: None Procedures   Critical Care performed:  yes  CRITICAL CARE Performed by: Rudene Re  ?  Total critical care time: 30 min  Critical care time was exclusive of separately billable procedures and treating other patients.  Critical care was necessary to treat or  prevent imminent or life-threatening deterioration.  Critical care was time spent personally by me on the following activities: development of treatment plan with patient and/or surrogate as well as nursing, discussions with consultants, evaluation of patient's response to treatment, examination of patient, obtaining history from patient or surrogate, ordering and performing treatments and interventions, ordering and review of laboratory studies, ordering and review of radiographic studies, pulse oximetry and re-evaluation of patient's condition.  ____________________________________________   INITIAL IMPRESSION / ASSESSMENT AND PLAN / ED COURSE  85 y.o. male with a history of hypertension, hyperlipidemia, peripheral vascular disease on Plavix, OSA, glaucoma, asthma  who presents for evaluation of an abnormal outpatient CT. patient had a CT done today as an outpatient by his PCP showing a potentially unstable T11 fracture.  Patient sustained a fall almost a month ago.  He has tenderness to palpation in the midline of the lower thoracic spine but he is otherwise neurologically intact with normal reflexes, intact strength and sensation of bilateral lower extremities.  Imaging done as an outpatient was reviewed by me and read was confirmed by radiology.  We will get an MRI of the spine.   History gathered from patient and family who was at bedside.  Plan discussed with both of them.  I reviewed notes from patient's primary care visit yesterday.  We will provide patient with IV fentanyl for the pain.   _________________________ 6:04 AM on 11/22/2020 ----------------------------------------- MRI confirms a 3 column unstable T11 fracture and also T12 and L1 fractures.  Discussed with Dr. [name] from neurosurgery who recommended head of bed flat, TLSO brace, and emergent surgery.  Recommended admitting patient to the hospitalist for medical clearance.  The findings and recommendations were discussed with  patient's daughter Ms. June and over the phone.  We will get surgical labs.  Strict recommendations including do not ambulate and keep patient flat at all times were communicated with nursing staff and hospitalist.      _____________________________________________ Please note:  Patient was evaluated in Emergency Department today for the symptoms described in the history of present illness. Patient was evaluated in the context of the global COVID-19 pandemic, which necessitated consideration that the patient might be at risk for infection with the SARS-CoV-2 virus that causes COVID-19. Institutional protocols and algorithms that pertain to the evaluation of patients at risk for COVID-19 are in a state of rapid change based on information released by regulatory bodies including the CDC and federal and state organizations. These policies and algorithms were followed during the patient's care in the ED.  Some ED evaluations and interventions may be delayed as a result of limited staffing during the pandemic.   [place] Controlled Substance Database was reviewed by me. ____________________________________________   FINAL CLINICAL IMPRESSION(S) / ED DIAGNOSES   Final diagnoses:  Unstable burst fracture of t11-T12 vertebra, subsequent encounter for fracture with delayed healing      NEW MEDICATIONS STARTED DURING THIS VISIT:  ED Discharge Orders     None        Note:  This document was prepared using Dragon voice recognition software and may include unintentional dictation errors.    Rudene Re, MD 11/22/20 [phone]

## 2020-11-22 NOTE — ED Notes (Signed)
Called for Ortho Brace to [facility]) TLSO brace ordered per Alfred Levins, MD

## 2020-11-22 NOTE — Progress Notes (Signed)
Orthopedic Tech Progress Note Patient Details:  Chris Kent 16-Sep-1935 [id number]  [facility] needed more info on patient regarding TLSO BRACE.    Patient ID: Chris Kent, male   DOB: 10-08-1935, 85 y.o.   MRN: [id number]  [name] 11/22/2020, 8:16 AM

## 2020-11-22 NOTE — ED Notes (Signed)
Pt to MRI at this time.

## 2020-11-22 NOTE — H&P (Signed)
History and Physical    Chris Kent WNU:[id number] DOB: 07-Jun-1935 DOA: 11/22/2020  Referring MD/NP/PA:   PCP: Idelle Crouch, MD   Patient coming from:  The patient is coming from home.   Chief Complaint: back pain  HPI: Chris Kent is a 85 y.o. male with medical history significant of hypertension, hyperlipidemia, asthma on 1 L oxygen, stroke, GERD, anxiety, OSA not on CPAP, PVD, BPH, who presents with back pain.  Pt had fall resulting in possible acute fracture of the anterior bridging osteophytes of T11 and T12,. Pt was hospitalized from 9/20 to 9/28 and discharged to nursing home. He continues to have back pain after he went home. He returned to his PCP yesterday for hospital follow up and had CT scan showing unstable T11 fracture.  Patient states the pain is moderate to severe, sharp, nonradiating.  Denies leg numbness. Patient does not have chest pain or shortness.  Patient has mild dry cough.  No nausea, vomiting, diarrhea or abdominal pain. He states that he took last dose of Plavix yesterday.  ED Course: pt was found to have WBC 9.9, INR 1.1, PTT 24, negative COVID PCR, electrolytes renal function okay, temperature normal, blood pressure 144/61, heart rate 79, RR 20, oxygen saturation 98% on 1 L home level oxygen.  Patient is admitted to [place] bed as inpatient, Dr. [name] of neurosurgery is consulted.  CT-T spin Background pattern of ankylosis throughout the region consistent with the diagnosis of ankylosing spondylitis. Fracture at T11 affecting the posterior elements and vertebral body with anterolisthesis of 2 mm. This would be an unstable or potentially unstable fracture.  CT-L spin: Chronic findings of ankylosing spondylitis with solid ankylosis throughout the region, including both sacroiliac joints. No acute or traumatic finding. 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 No other compressive canal or foraminal narrowing  suspected.  MRI-L spin 1. Increased T2 signal at the inferior aspect of T11, extending into the bilateral posterior elements, concerning for 3 column injury. 2. Increased T2 signal at the superior aspect of T12 and at the inferior posterior aspect of L1, also concerning for acute fractures, without involvement of the posterior elements. 3. No evidence of retropulsion, cord compression, or epidural hematoma.  Review of Systems:   General: no fevers, chills, no body weight gain, has fatigue HEENT: no blurry vision, hearing changes or sore throat Respiratory: no dyspnea, coughing, wheezing CV: no chest pain, no palpitations GI: no nausea, vomiting, abdominal pain, diarrhea, constipation GU: no dysuria, burning on urination, increased urinary frequency, hematuria  Ext: has leg edema Neuro: no unilateral weakness, numbness, or tingling, no vision change or hearing loss Skin: no rash, no skin tear. MSK: has back pain Heme: No easy bruising.  Travel history: No recent long distant travel.  Allergy: No Known Allergies  Past Medical History:  Diagnosis Date   Anxiety    Arthritis    Asthma    GERD (gastroesophageal reflux disease)    Glaucoma    Glaucoma    History of stroke    Hyperlipidemia    Hyperlipidemia    Hypertension    Peripheral vascular disease ([place])    Sleep apnea     Past Surgical History:  Procedure Laterality Date   REPLACEMENT TOTAL KNEE BILATERAL      Social History:  reports that he has never smoked. He has never used smokeless tobacco. He reports that he does not drink alcohol and does not use drugs.  Family History:  Family History  Problem Relation Age of Onset   Prostate cancer Father      Prior to Admission medications   Medication Sig Start Date End Date Taking? Authorizing Provider  acetaminophen (TYLENOL) 500 MG tablet Take 2 tablets (1,000 mg total) by mouth 3 (three) times daily. 10/31/20   Bonnielee Haff, MD  albuterol (PROVENTIL) (2.5  MG/3ML) 0.083% nebulizer solution Take 3 mLs by nebulization every 6 (six) hours as needed for wheezing or shortness of breath. 10/31/20   Bonnielee Haff, MD  bisacodyl (DULCOLAX) 10 MG suppository Place 1 suppository (10 mg total) rectally daily as needed for moderate constipation. 10/31/20   Bonnielee Haff, MD  budesonide-formoterol [facility]) 160-4.5 MCG/ACT inhaler Inhale 2 puffs into the lungs daily. 10/31/20   Bonnielee Haff, MD  cetirizine (ZYRTEC) 10 MG tablet Take 10 mg by mouth at bedtime.    [provider]  clopidogrel (PLAVIX) 75 MG tablet Take 75 mg by mouth daily.    [provider]  Dorzolamide HCl-Timolol Mal PF 22.3-6.8 MG/ML SOLN Place 1 drop into the right eye 2 (two) times daily.    [provider]  enoxaparin (LOVENOX) 60 MG/0.6ML injection Inject 0.525 mLs (52.5 mg total) into the skin daily for 7 days. May discontinue once mobility improves 10/31/20 11/07/20  Bonnielee Haff, MD  latanoprost (XALATAN) 0.005 % ophthalmic solution Place 1 drop into both eyes at bedtime.    [provider]  losartan (COZAAR) 50 MG tablet Take 50 mg by mouth 2 (two) times daily.    [provider]  melatonin 5 MG TABS Take 1 tablet (5 mg total) by mouth at bedtime. 10/31/20   Bonnielee Haff, MD  methocarbamol (ROBAXIN) 500 MG tablet Take 1 tablet (500 mg total) by mouth every 8 (eight) hours as needed for muscle spasms. 10/31/20   Bonnielee Haff, MD  metoprolol succinate (TOPROL-XL) 50 MG 24 hr tablet Take 1 tablet (50 mg total) by mouth daily. Take with or immediately following a meal. 11/01/20   Bonnielee Haff, MD  naproxen (NAPROSYN) 250 MG tablet Take 1 tablet (250 mg total) by mouth 2 (two) times daily as needed (for pain not relieved with oxycodone). 10/31/20   Bonnielee Haff, MD  oxyCODONE (OXY IR/ROXICODONE) 5 MG immediate release tablet Take 1 tablet (5 mg total) by mouth every 6 (six) hours as needed for breakthrough pain. 10/31/20   Bonnielee Haff, MD  pantoprazole (PROTONIX) 40 MG tablet Take 1 tablet (40 mg total) by mouth daily. 10/31/20   Bonnielee Haff, MD  polyethylene glycol (MIRALAX / GLYCOLAX) 17 g packet Take 17 g by mouth daily. 10/31/20   Bonnielee Haff, MD  rosuvastatin (CRESTOR) 10 MG tablet Take 10 mg by mouth at bedtime.    [provider]  senna-docusate (SENOKOT-S) 8.6-50 MG tablet Take 2 tablets by mouth at bedtime. 10/31/20   Bonnielee Haff, MD  tamsulosin (FLOMAX) 0.4 MG CAPS capsule Take 2 capsules (0.8 mg total) by mouth daily after supper. 10/31/20   Bonnielee Haff, MD  triamcinolone cream (KENALOG) 0.1 % Apply 1 application topically 3 (three) times daily. (Apply to rash on legs)    [provider]  vitamin B-12 1000 MCG tablet Take 1 tablet (1,000 mcg total) by mouth daily. 10/31/20   Bonnielee Haff, MD  Vitamin D, Ergocalciferol, (DRISDOL) 1.25 MG (50000 UNIT) CAPS capsule Take 1 capsule (50,000 Units total) by mouth every 7 (seven) days. 11/05/20   Bonnielee Haff, MD    Physical Exam:  Vitals:   11/22/20 1230 11/22/20 1233 11/22/20 1426 11/22/20 1600  BP:  131/78 138/70 131/67  Pulse: 73 84  70  Resp: 14 14 16 13   Temp:   98.2 F (36.8 C) 98.3 F (36.8 C)  TempSrc:   Oral Oral  SpO2: 99% 98%  100%  Weight:      Height:       General: Not in acute distress HEENT:       Eyes: PERRL, EOMI, no scleral icterus.       ENT: No discharge from the ears and nose, no pharynx injection, no tonsillar enlargement.        Neck: No JVD, no bruit, no mass felt. Heme: No neck lymph node enlargement. Cardiac: S1/S2, RRR, No murmurs, No gallops or rubs. Respiratory: No rales, wheezing, rhonchi or rubs. GI: Soft, nondistended, nontender, no rebound pain, no organomegaly, BS present. GU: No hematuria Ext: 1+ pitting leg edema bilaterally. 1+DP/PT pulse bilaterally. Musculoskeletal: has tenderness in the midline of middle and lower back Skin: No rashes.  Neuro: Alert, oriented X3, cranial  nerves II-XII grossly intact, moves all extremities  Psych: Patient is not psychotic, no suicidal or hemocidal ideation.  Labs on Admission: I have personally reviewed following labs and imaging studies  CBC: Recent Labs  Lab 11/22/20 0630  WBC 9.9  HGB 15.7  HCT 46.9  MCV 95.1  PLT 665*   Basic Metabolic Panel: Recent Labs  Lab 11/22/20 0630  NA 143  K 4.1  CL 108  CO2 30  GLUCOSE 100*  BUN 21  CREATININE 0.82  CALCIUM 8.9   GFR: Estimated Creatinine Clearance: 80.4 mL/min (by C-G formula based on SCr of 0.82 mg/dL). Liver Function Tests: No results for input(s): AST, ALT, ALKPHOS, BILITOT, PROT, ALBUMIN in the last 168 hours. No results for input(s): LIPASE, AMYLASE in the last 168 hours. No results for input(s): AMMONIA in the last 168 hours. Coagulation Profile: Recent Labs  Lab 11/22/20 0630  INR 1.1   Cardiac Enzymes: No results for input(s): CKTOTAL, CKMB, CKMBINDEX, TROPONINI in the last 168 hours. BNP (last 3 results) No results for input(s): PROBNP in the last 8760 hours. HbA1C: No results for input(s): HGBA1C in the last 72 hours. CBG: No results for input(s): GLUCAP in the last 168 hours. Lipid Profile: No results for input(s): CHOL, HDL, LDLCALC, TRIG, CHOLHDL, LDLDIRECT in the last 72 hours. Thyroid Function Tests: No results for input(s): TSH, T4TOTAL, FREET4, T3FREE, THYROIDAB in the last 72 hours. Anemia Panel: No results for input(s): VITAMINB12, FOLATE, FERRITIN, TIBC, IRON, RETICCTPCT in the last 72 hours. Urine analysis:    Component Value Date/Time   COLORURINE YELLOW (A) 10/26/2020 1915   APPEARANCEUR CLEAR (A) 10/26/2020 1915   APPEARANCEUR Clear 11/21/2017 1011   LABSPEC 1.035 (H) 10/26/2020 1915   LABSPEC 1.013 09/20/2012 0122   PHURINE 6.0 10/26/2020 1915   GLUCOSEU NEGATIVE 10/26/2020 1915   GLUCOSEU Negative 09/20/2012 0122   HGBUR MODERATE (A) 10/26/2020 1915   BILIRUBINUR NEGATIVE 10/26/2020 1915   BILIRUBINUR Negative  11/21/2017 1011   BILIRUBINUR Negative 09/20/2012 0122   KETONESUR NEGATIVE 10/26/2020 1915   PROTEINUR 30 (A) 10/26/2020 1915   NITRITE NEGATIVE 10/26/2020 1915   LEUKOCYTESUR NEGATIVE 10/26/2020 1915   LEUKOCYTESUR Negative 09/20/2012 0122   Sepsis Labs: @LABRCNTIP (procalcitonin:4,lacticidven:4) ) Recent Results (from the past 240 hour(s))  Resp Panel by RT-PCR (Flu A&B, Covid) Nasopharyngeal Swab     Status: None   Collection Time: 11/22/20  6:30 AM  Specimen: Nasopharyngeal Swab; Nasopharyngeal(NP) swabs in vial transport medium  Result Value Ref Range Status   SARS Coronavirus 2 by RT PCR NEGATIVE NEGATIVE Final    Comment: (NOTE) SARS-CoV-2 target nucleic acids are NOT DETECTED.  The SARS-CoV-2 RNA is generally detectable in upper respiratory specimens during the acute phase of infection. The lowest concentration of SARS-CoV-2 viral copies this assay can detect is 138 copies/mL. A negative result does not preclude SARS-Cov-2 infection and should not be used as the sole basis for treatment or other patient management decisions. A negative result may occur with  improper specimen collection/handling, submission of specimen other than nasopharyngeal swab, presence of viral mutation(s) within the areas targeted by this assay, and inadequate number of viral copies(<138 copies/mL). A negative result must be combined with clinical observations, patient history, and epidemiological information. The expected result is Negative.  Fact Sheet for Patients:  EntrepreneurPulse.com.au  Fact Sheet for Healthcare Providers:  IncredibleEmployment.be  This test is no t yet approved or cleared by the [place] FDA and  has been authorized for detection and/or diagnosis of SARS-CoV-2 by FDA under an Emergency Use Authorization (EUA). This EUA will remain  in effect (meaning this test can be used) for the duration of the COVID-19 declaration under  Section 564(b)(1) of the Act, 21 U.S.C.section 360bbb-3(b)(1), unless the authorization is terminated  or revoked sooner.       Influenza A by PCR NEGATIVE NEGATIVE Final   Influenza B by PCR NEGATIVE NEGATIVE Final    Comment: (NOTE) The Xpert Xpress SARS-CoV-2/FLU/RSV plus assay is intended as an aid in the diagnosis of influenza from Nasopharyngeal swab specimens and should not be used as a sole basis for treatment. Nasal washings and aspirates are unacceptable for Xpert Xpress SARS-CoV-2/FLU/RSV testing.  Fact Sheet for Patients: EntrepreneurPulse.com.au  Fact Sheet for Healthcare Providers: IncredibleEmployment.be  This test is not yet approved or cleared by the [place] FDA and has been authorized for detection and/or diagnosis of SARS-CoV-2 by FDA under an Emergency Use Authorization (EUA). This EUA will remain in effect (meaning this test can be used) for the duration of the COVID-19 declaration under Section 564(b)(1) of the Act, 21 U.S.C. section 360bbb-3(b)(1), unless the authorization is terminated or revoked.  Performed at [facility], [address], [place], [place] [address]      Radiological Exams on Admission: CT THORACIC SPINE WO CONTRAST  Result Date: 11/21/2020 CLINICAL DATA:  Thoracic region back pain. EXAM: CT THORACIC SPINE WITHOUT CONTRAST TECHNIQUE: Multidetector CT images of the thoracic were obtained using the standard protocol without intravenous contrast. COMPARISON:  Chest CT 10/26/2020 FINDINGS: Alignment: Normal except for 2 mm of anterolisthesis at the T11 level related to the fracture described below. Vertebrae: Solid ankylosis from the lower cervical region 2 T11. Previous posterior fusion procedure in the cervical and upper thoracic region to the level of T2. There is an unstable fracture of the T11 level involving the posterior elements and inferior vertebral body. There is anterolisthesis  of 2 mm. T12 and L1 appear normal. Paraspinal and other soft tissues: Negative Disc levels: No evidence of compressive canal or foraminal stenosis. IMPRESSION: Background pattern of ankylosis throughout the region consistent with the diagnosis of ankylosing spondylitis. Fracture at T11 affecting the posterior elements and vertebral body with anterolisthesis of 2 mm. This would be an unstable or potentially unstable fracture. Call report in progress. Electronically Signed   By: Nelson Chimes M.D.   On: 11/21/2020 15:55   CT  LUMBAR SPINE WO CONTRAST  Result Date: 11/21/2020 CLINICAL DATA:  Back and lumbar region pain EXAM: CT LUMBAR SPINE WITHOUT CONTRAST TECHNIQUE: Multidetector CT imaging of the lumbar spine was performed without intravenous contrast administration. Multiplanar CT image reconstructions were also generated. COMPARISON:  None. FINDINGS: Segmentation: 5 lumbar type vertebral bodies. Alignment: No malalignment. Vertebrae and disc levels: Ankylosis throughout the region at each intervertebral level and complete ankylosis of the sacroiliac joints, typical of ankylosing spondylitis. No evidence of regional fracture. There is chronic bony foraminal stenosis on the right at C2-3. Other foramina appear sufficiently patent. No canal stenosis. Paraspinal and other soft tissues: Negative other than aortic atherosclerosis without visible aneurysm. IMPRESSION: Chronic findings of ankylosing spondylitis with solid ankylosis throughout the region, including both sacroiliac joints. No acute or traumatic finding. 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 No other compressive canal or foraminal narrowing suspected. Electronically Signed   By: Nelson Chimes M.D.   On: 11/21/2020 15:51   MR THORACIC SPINE WO CONTRAST  Result Date: 11/22/2020 CLINICAL DATA:  Spine fracture, traumatic EXAM: MRI THORACIC SPINE WITHOUT CONTRAST TECHNIQUE: Multiplanar, multisequence MR imaging of the  thoracic spine was performed. No intravenous contrast was administered. COMPARISON:  11/21/2020 CT thoracic spine FINDINGS: Evaluation is somewhat limited by inability to cross reference from the axial to sagittal series, as the localizer had to be re-run. Alignment: Physiologic. Vertebrae: Decreased T1 signal and increased T2 signal at the inferior aspect of T11 and the superior aspect of T12, concerning for acute fractures. The T11 fracture extends into the bilateral posterior elements. The T12 fracture does not appear to involve the posterior elements. Increased T2 signal is also seen at the posteroinferior aspect of L1. No evidence of retropulsion of fracture fragments. Cord: Normal signal and morphology. No evidence of cord compression or epidural hematoma. Paraspinal and other soft tissues: Negative. Disc levels: No high-grade spinal canal stenosis. Neural foraminal narrowing at T11-T12 bilaterally, secondary to facet fracture. IMPRESSION: 1. Increased T2 signal at the inferior aspect of T11, extending into the bilateral posterior elements, concerning for 3 column injury. 2. Increased T2 signal at the superior aspect of T12 and at the inferior posterior aspect of L1, also concerning for acute fractures, without involvement of the posterior elements. 3. No evidence of retropulsion, cord compression, or epidural hematoma. These results were called by telephone at the time of interpretation on 11/22/2020 at 4:02 am to provider [facility] , who verbally acknowledged these results. Electronically Signed   By: Merilyn Baba M.D.   On: 11/22/2020 04:04     EKG:  Not done in ED, will get one.   Assessment/Plan Principal Problem:   Fracture, thoracic vertebra (HCC) Active Problems:   Hyperlipidemia   Enlarged prostate with lower urinary tract symptoms (LUTS)   Hypertension   History of stroke   Asthma   Fracture, thoracic vertebra ([place]):  pt has unstable T11 vertebral fx. Dr. [name] of neurosurgery  is consulted, planning to do surgery when people is to wean off of Plavix.  Last dose of Plavix was yesterday  -Admitted to [place] bed as inpatient -Hold Plavix -Per neurosurgeon, "TLSO at all times when upright or OOB. Ok to sit up and ambulate for short periods of time as needed with TLSO on". -As needed Percocet, morphine for pain, as needed Robaxin -INR/PTT, type screen   Enlarged prostate with lower urinary tract symptoms (LUTS) -Flomax  HLD: -Crestor  Hypertension -IV hydralazine as needed -Cozaar, metoprolol,  History of stroke -Crestor -Hold Plavix  Asthma: Stable -Bronchodilators   Perioperative Cardiac Risk: pt has multiple comorbidities as listed above. Patient was active and independent of ADLs and, IADLs before his fall. No recent acute cardiac issues.  Patient does not have chest pain, shortness of breath, palpitation. Pt has 1+ leg edema, but no history of CHF.  2D echo on 03/15/2015 showed EF > 55%.  BNP 36.8. No signs of acute CHF. At this time point, no further work up is needed. Patient's [name] score perioperative myocardial infarction or cardaic arrest is 0.89% and Revised Cardiac Risk Index [place] Criteria) is 0.90%.  DVT ppx: SCD Code Status: Partial code (I discussed with the patient and explained the meaning of CODE STATUS, patient wants to be partial code, OK for CPR, but no intubation). Family Communication:   Yes, patient's grandson by phone at bed side Disposition Plan:  Anticipate discharge back to SNF Consults called:  Dr. [name] of neurosurgery Admission status and Level of care: Med-Surg:    as inpt       Status is: Inpatient  Remains inpatient appropriate           Date of Service 11/22/2020    [facility]   If 7PM-7AM, please contact night-coverage [email or web address] 11/22/2020, 6:45 PM

## 2020-11-22 NOTE — ED Notes (Signed)
Informed RN bed assigned 

## 2020-11-22 NOTE — ED Notes (Signed)
Pt asking for water. Water offered to pt. Pt took two sips. Pt c/o being uncomfortable at this time d/t pain in his back.

## 2020-11-22 NOTE — Consult Note (Signed)
Neurosurgery-New Consultation Evaluation 11/22/2020 Chris Kent [id number]  Identifying Statement: Chris Kent is a 85 y.o. male from [place] [id number] with back pain and unstable thoracic fracture  Physician Requesting Consultation: No ref. provider found  History of Present Illness:  Chris Kent is a 85 y.o with a history of BPH, HLD, HTN, GERD, CAD, and CVA on Plavix presenting with 1 month of mid back pain. He had a fall while weed eating about 1 month ago resulting in a thoracic fracture. He was subsequently placed at a SNF due to the need for additional assistance as he lives alone.  He returned to his PCP yesterday for hospital follow up and continued back pain and was subsequently sent for updated CT scan showing T11 fracture with 3 column involvement. He presented to the ER for further evaluation and treatment. Today he reports continued mid back pain without radiation into his abdomen or legs. He denies any lower extremity weakness or numbness. He denies any bowel or bladder incontinence.  Of note the patient reports a last dose of Plavix yesterday.   Past Medical History:  Past Medical History:  Diagnosis Date   Anxiety    Arthritis    Asthma    GERD (gastroesophageal reflux disease)    Glaucoma    Glaucoma    History of stroke    Hyperlipidemia    Hyperlipidemia    Hypertension    Peripheral vascular disease (HCC)    Sleep apnea     Social History: Social History   Socioeconomic History   Marital status: Married    Spouse name: Not on file   Number of children: Not on file   Years of education: Not on file   Highest education level: Not on file  Occupational History   Not on file  Tobacco Use   Smoking status: Never   Smokeless tobacco: Never  Vaping Use   Vaping Use: Never used  Substance and Sexual Activity   Alcohol use: No   Drug use: No   Sexual activity: Yes    Birth control/protection: None  Other Topics Concern   Not on  file  Social History Narrative   Not on file   Social Determinants of Health   Financial Resource Strain: Not on file  Food Insecurity: Not on file  Transportation Needs: Not on file  Physical Activity: Not on file  Stress: Not on file  Social Connections: Not on file  Intimate Partner Violence: Not on file   Living arrangements (living alone, with partner): Lives alone  Family History: Family History  Problem Relation Age of Onset   Prostate cancer Father     Review of Systems:  Review of Systems - General ROS: Negative Psychological ROS: Negative Ophthalmic ROS: Negative ENT ROS: Negative Hematological and Lymphatic ROS: Negative  Endocrine ROS: Negative Respiratory ROS: Negative Cardiovascular ROS: Negative Gastrointestinal ROS: Negative Genito-Urinary ROS: Negative Musculoskeletal ROS: Negative Neurological ROS: Negative Dermatological ROS: Negative  Physical Exam: BP (!) 144/61   Pulse 74   Temp 98 F (36.7 C) (Oral)   Resp 16   Ht 5\' 9"  (1.753 m)   Wt 102.1 kg   SpO2 96%   BMI 33.23 kg/m  Body mass index is 33.23 kg/m. Body surface area is 2.23 meters squared. General appearance: Alert, cooperative, in no acute distress Head: Normocephalic, atraumatic Eyes: Normal, EOM intact Oropharynx: Moist without lesions Neck: Supple, no tenderness Abdomen: distended but non tender Ext: No edema in LE bilaterally, good  distal pulses  Neurologic exam:  Mental status: alertness: alert, orientation: person, place, time, affect: normal Speech: fluent and clear Cranial nerves:  II: Visual fields are full by confrontation, no ptosis III/IV/VI: extra-ocular motions intact bilaterally V/VII:no evidence of facial droop or weakness and facial sensation intact VIII: hearing normal XI: trapezius strength symmetric,  sternocleidomastoid strength symmetric XII: tongue strength symmetric  Motor:strength symmetric 5/5, normal muscle mass and tone in all extremities and  no pronator drift Sensory: intact to light touch in all extremities Reflexes: 2+ and symmetric bilaterally for arms and legs Coordination: intact finger to nose Gait: untested  Laboratory: Results for orders placed or performed during the hospital encounter of 11/22/20  Resp Panel by RT-PCR (Flu A&B, Covid) Nasopharyngeal Swab   Specimen: Nasopharyngeal Swab; Nasopharyngeal(NP) swabs in vial transport medium  Result Value Ref Range   SARS Coronavirus 2 by RT PCR NEGATIVE NEGATIVE   Influenza A by PCR NEGATIVE NEGATIVE   Influenza B by PCR NEGATIVE NEGATIVE  CBC  Result Value Ref Range   WBC 9.9 4.0 - 10.5 K/uL   RBC 4.93 4.22 - 5.81 MIL/uL   Hemoglobin 15.7 13.0 - 17.0 g/dL   HCT 46.9 39.0 - 52.0 %   MCV 95.1 80.0 - 100.0 fL   MCH 31.8 26.0 - 34.0 pg   MCHC 33.5 30.0 - 36.0 g/dL   RDW 15.0 11.5 - 15.5 %   Platelets 146 (L) 150 - 400 K/uL   nRBC 0.0 0.0 - 0.2 %  Basic metabolic panel  Result Value Ref Range   Sodium 143 135 - 145 mmol/L   Potassium 4.1 3.5 - 5.1 mmol/L   Chloride 108 98 - 111 mmol/L   CO2 30 22 - 32 mmol/L   Glucose, Bld 100 (H) 70 - 99 mg/dL   BUN 21 8 - 23 mg/dL   Creatinine, Ser 0.82 0.61 - 1.24 mg/dL   Calcium 8.9 8.9 - 10.3 mg/dL   GFR, Estimated >60 >60 mL/min   Anion gap 5 5 - 15  Protime-INR  Result Value Ref Range   Prothrombin Time 13.9 11.4 - 15.2 seconds   INR 1.1 0.8 - 1.2  APTT  Result Value Ref Range   aPTT 25 24 - 36 seconds   I personally reviewed labs  Imaging:  11/21/20 Thoracic CT IMPRESSION: Background pattern of ankylosis throughout the region consistent with the diagnosis of ankylosing spondylitis. Fracture at T11 affecting the posterior elements and vertebral body with anterolisthesis of 2 mm. This would be an unstable or potentially unstable fracture.   Call report in progress.  Electronically Signed   By: Nelson Chimes M.D.   On: 11/21/2020 15:55  11/21/20 Lumbar CT IMPRESSION: Chronic findings of ankylosing  spondylitis with solid ankylosis throughout the region, including both sacroiliac joints. No acute or traumatic finding. 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 No other compressive canal or foraminal narrowing suspected.   Electronically Signed   By: Nelson Chimes M.D.   On: 11/21/2020 15:51  11/22/20 MRI thoracic spine IMPRESSION: 1. Increased T2 signal at the inferior aspect of T11, extending into the bilateral posterior elements, concerning for 3 column injury. 2. Increased T2 signal at the superior aspect of T12 and at the inferior posterior aspect of L1, also concerning for acute fractures, without involvement of the posterior elements. 3. No evidence of retropulsion, cord compression, or epidural hematoma. These results were called by telephone at the time of interpretation on 11/22/2020  at 4:02 am to provider Rudene Re , who verbally acknowledged these results.   Electronically Signed   By: Merilyn Baba M.D.   On: 11/22/2020 04:04  I personally reviewed radiology studies to include:   Impression/Plan:     1.  Diagnosis: Unstable T11 fracture  2.  Plan - hold Plavix - TLSO at all times when upright or OOB. Ok to sit up and ambulate for short periods of time as needed with TLSO on. - admit to medicine for pain control and medical management - plan for thoracic fusion in a delayed fashion due to recent Plavix use  - ok for diet from neurosurgical standpoint  Cooper Render PA-C Neurosurgery

## 2020-11-22 NOTE — ED Notes (Signed)
MRI tech called writer to inform that pt could not complete study due to intensity of pain and position. MD made aware.

## 2020-11-23 DIAGNOSIS — I10 Essential (primary) hypertension: Secondary | ICD-10-CM

## 2020-11-23 DIAGNOSIS — S22089A Unspecified fracture of T11-T12 vertebra, initial encounter for closed fracture: Secondary | ICD-10-CM

## 2020-11-23 DIAGNOSIS — N401 Enlarged prostate with lower urinary tract symptoms: Secondary | ICD-10-CM

## 2020-11-23 DIAGNOSIS — S22082G Unstable burst fracture of T11-T12 vertebra, subsequent encounter for fracture with delayed healing: Secondary | ICD-10-CM

## 2020-11-23 DIAGNOSIS — Z8673 Personal history of transient ischemic attack (TIA), and cerebral infarction without residual deficits: Secondary | ICD-10-CM

## 2020-11-23 LAB — CBC
HCT: 44 % (ref 39.0–52.0)
Hemoglobin: 14.4 g/dL (ref 13.0–17.0)
MCH: 30.6 pg (ref 26.0–34.0)
MCHC: 32.7 g/dL (ref 30.0–36.0)
MCV: 93.4 fL (ref 80.0–100.0)
Platelets: 125 10*3/uL — ABNORMAL LOW (ref 150–400)
RBC: 4.71 MIL/uL (ref 4.22–5.81)
RDW: 14.5 % (ref 11.5–15.5)
WBC: 8.4 10*3/uL (ref 4.0–10.5)
nRBC: 0 % (ref 0.0–0.2)

## 2020-11-23 LAB — GLUCOSE, CAPILLARY
Glucose-Capillary: 87 mg/dL (ref 70–99)
Glucose-Capillary: 88 mg/dL (ref 70–99)
Glucose-Capillary: 92 mg/dL (ref 70–99)
Glucose-Capillary: 95 mg/dL (ref 70–99)
Glucose-Capillary: 109 mg/dL — ABNORMAL HIGH (ref 70–99)

## 2020-11-23 NOTE — Plan of Care (Signed)

## 2020-11-23 NOTE — Progress Notes (Signed)
PROGRESS NOTE    Chris Kent  VEL:[id number] DOB: [birth year] DOA: 11/22/2020 PCP: Idelle Crouch, MD   Brief Narrative: Taken from H&P. Pt had fall resulting in possible acute fracture of the anterior bridging osteophytes of T11 and T12,. Pt was hospitalized from 9/20 to 9/28 and discharged to nursing home. He continues to have back pain after he went home. He returned to his PCP yesterday for hospital follow up and had CT scan showing unstable T11 fracture.  Rest of the imaging was without any other significant abnormality. Neurosurgery was consulted and they will take him to the OR for fusion after Plavix washout.  Last Plavix dose was on 11/21/2020.  Admitted for pain control.   At this time point, no further work up is needed. Patient's [name] score perioperative myocardial infarction or cardaic arrest is 0.89% and Revised Cardiac Risk Index [place] Criteria) is 0.90%. Chris Kent is a 85 y.o. male with medical history significant of hypertension, hyperlipidemia, asthma on 1 L oxygen, stroke, GERD, anxiety, OSA not on CPAP, PVD, BPH, who presents with back pain.  Subjective: Patient continued to have back pain.  We discussed about waiting for Plavix washout before proceeding for surgery.  Patient seems understanding.  Assessment & Plan:   Principal Problem:   Fracture, thoracic vertebra (HCC) Active Problems:   Hyperlipidemia   Enlarged prostate with lower urinary tract symptoms (LUTS)   Hypertension   History of stroke   Asthma  Fracture, thoracic vertebra ([place]):  pt has unstable T11 vertebral fracture.  Neurosurgery was consulted and they are advising TLSO all the time and they will wait for Plavix washout, last dose was on 11/21/2020.  Not sure whether 3 or 5 days.  As 3 days are appropriate for surgery. -Continue with pain management  Enlarged prostate with lower urinary tract symptoms (LUTS) -Continue Flomax   HLD: -Continue Crestor   Hypertension -Continue IV  hydralazine as needed -Continue home dose of Cozaar, metoprolol,   History of stroke -Crestor -Hold Plavix  Asthma: Stable -Bronchodilators as needed.  Objective: Vitals:   11/23/20 0013 11/23/20 0431 11/23/20 0812 11/23/20 1138  BP: 129/72 (!) 145/75 140/68 130/72  Pulse: 66 61 73 63  Resp: 17 16  18   Temp: 97.8 F (36.6 C) 97.7 F (36.5 C) 97.8 F (36.6 C) 97.8 F (36.6 C)  TempSrc: Oral Oral    SpO2: 98% 97% 98% 98%  Weight:      Height:        Intake/Output Summary (Last 24 hours) at 11/23/2020 1423 Last data filed at 11/23/2020 0600 Gross per 24 hour  Intake --  Output 200 ml  Net -200 ml   Filed Weights   11/21/20 1725  Weight: 102.1 kg    Examination:  General exam: Appears calm and comfortable, back braces on Respiratory system: Clear to auscultation. Respiratory effort normal. Cardiovascular system: S1 & S2 heard, RRR.  Gastrointestinal system: Soft, nontender, nondistended, bowel sounds positive. Central nervous system: Alert and oriented. No focal neurological deficits. Extremities: No edema, no cyanosis, pulses intact and symmetrical. Psychiatry: Judgement and insight appear normal.    DVT prophylaxis: SCDs Code Status: Full-patient was initially partial code with DNI ordered, now agreeable to undergo intubation for surgery. Family Communication: Discussed with daughter on phone Disposition Plan:  Status is: Inpatient  Remains inpatient appropriate because: Severity of illness.   Level of care: Med-Surg  All the records are reviewed and case discussed with Care Management/Social Worker. Management plans discussed  with the patient, nursing and they are in agreement.  Consultants:  Neurosurgery  Procedures:  Antimicrobials:   Data Reviewed: I have personally reviewed following labs and imaging studies  CBC: Recent Labs  Lab 11/22/20 0630 11/23/20 0647  WBC 9.9 8.4  HGB 15.7 14.4  HCT 46.9 44.0  MCV 95.1 93.4  PLT 146* 125*    Basic Metabolic Panel: Recent Labs  Lab 11/22/20 0630  NA 143  K 4.1  CL 108  CO2 30  GLUCOSE 100*  BUN 21  CREATININE 0.82  CALCIUM 8.9   GFR: Estimated Creatinine Clearance: 80.4 mL/min (by C-G formula based on SCr of 0.82 mg/dL). Liver Function Tests: No results for input(s): AST, ALT, ALKPHOS, BILITOT, PROT, ALBUMIN in the last 168 hours. No results for input(s): LIPASE, AMYLASE in the last 168 hours. No results for input(s): AMMONIA in the last 168 hours. Coagulation Profile: Recent Labs  Lab 11/22/20 0630  INR 1.1   Cardiac Enzymes: No results for input(s): CKTOTAL, CKMB, CKMBINDEX, TROPONINI in the last 168 hours. BNP (last 3 results) No results for input(s): PROBNP in the last 8760 hours. HbA1C: No results for input(s): HGBA1C in the last 72 hours. CBG: Recent Labs  Lab 11/23/20 0736 11/23/20 0858 11/23/20 1134  GLUCAP 87 88 92   Lipid Profile: No results for input(s): CHOL, HDL, LDLCALC, TRIG, CHOLHDL, LDLDIRECT in the last 72 hours. Thyroid Function Tests: No results for input(s): TSH, T4TOTAL, FREET4, T3FREE, THYROIDAB in the last 72 hours. Anemia Panel: No results for input(s): VITAMINB12, FOLATE, FERRITIN, TIBC, IRON, RETICCTPCT in the last 72 hours. Sepsis Labs: No results for input(s): PROCALCITON, LATICACIDVEN in the last 168 hours.  Recent Results (from the past 240 hour(s))  Resp Panel by RT-PCR (Flu A&B, Covid) Nasopharyngeal Swab     Status: None   Collection Time: 11/22/20  6:30 AM   Specimen: Nasopharyngeal Swab; Nasopharyngeal(NP) swabs in vial transport medium  Result Value Ref Range Status   SARS Coronavirus 2 by RT PCR NEGATIVE NEGATIVE Final    Comment: (NOTE) SARS-CoV-2 target nucleic acids are NOT DETECTED.  The SARS-CoV-2 RNA is generally detectable in upper respiratory specimens during the acute phase of infection. The lowest concentration of SARS-CoV-2 viral copies this assay can detect is 138 copies/mL. A negative result  does not preclude SARS-Cov-2 infection and should not be used as the sole basis for treatment or other patient management decisions. A negative result may occur with  improper specimen collection/handling, submission of specimen other than nasopharyngeal swab, presence of viral mutation(s) within the areas targeted by this assay, and inadequate number of viral copies(<138 copies/mL). A negative result must be combined with clinical observations, patient history, and epidemiological information. The expected result is Negative.  Fact Sheet for Patients:  EntrepreneurPulse.com.au  Fact Sheet for Healthcare Providers:  IncredibleEmployment.be  This test is no t yet approved or cleared by the [place] FDA and  has been authorized for detection and/or diagnosis of SARS-CoV-2 by FDA under an Emergency Use Authorization (EUA). This EUA will remain  in effect (meaning this test can be used) for the duration of the COVID-19 declaration under Section 564(b)(1) of the Act, 21 U.S.C.section 360bbb-3(b)(1), unless the authorization is terminated  or revoked sooner.       Influenza A by PCR NEGATIVE NEGATIVE Final   Influenza B by PCR NEGATIVE NEGATIVE Final    Comment: (NOTE) The Xpert Xpress SARS-CoV-2/FLU/RSV plus assay is intended as an aid in the diagnosis of influenza from  Nasopharyngeal swab specimens and should not be used as a sole basis for treatment. Nasal washings and aspirates are unacceptable for Xpert Xpress SARS-CoV-2/FLU/RSV testing.  Fact Sheet for Patients: EntrepreneurPulse.com.au  Fact Sheet for Healthcare Providers: IncredibleEmployment.be  This test is not yet approved or cleared by the [place] FDA and has been authorized for detection and/or diagnosis of SARS-CoV-2 by FDA under an Emergency Use Authorization (EUA). This EUA will remain in effect (meaning this test can be used) for  the duration of the COVID-19 declaration under Section 564(b)(1) of the Act, 21 U.S.C. section 360bbb-3(b)(1), unless the authorization is terminated or revoked.  Performed at [facility], [address], [place], [place] [address]      Radiology Studies: CT THORACIC SPINE WO CONTRAST  Result Date: 11/21/2020 CLINICAL DATA:  Thoracic region back pain. EXAM: CT THORACIC SPINE WITHOUT CONTRAST TECHNIQUE: Multidetector CT images of the thoracic were obtained using the standard protocol without intravenous contrast. COMPARISON:  Chest CT 10/26/2020 FINDINGS: Alignment: Normal except for 2 mm of anterolisthesis at the T11 level related to the fracture described below. Vertebrae: Solid ankylosis from the lower cervical region 2 T11. Previous posterior fusion procedure in the cervical and upper thoracic region to the level of T2. There is an unstable fracture of the T11 level involving the posterior elements and inferior vertebral body. There is anterolisthesis of 2 mm. T12 and L1 appear normal. Paraspinal and other soft tissues: Negative Disc levels: No evidence of compressive canal or foraminal stenosis. IMPRESSION: Background pattern of ankylosis throughout the region consistent with the diagnosis of ankylosing spondylitis. Fracture at T11 affecting the posterior elements and vertebral body with anterolisthesis of 2 mm. This would be an unstable or potentially unstable fracture. Call report in progress. Electronically Signed   By: Nelson Chimes M.D.   On: 11/21/2020 15:55   CT LUMBAR SPINE WO CONTRAST  Result Date: 11/21/2020 CLINICAL DATA:  Back and lumbar region pain EXAM: CT LUMBAR SPINE WITHOUT CONTRAST TECHNIQUE: Multidetector CT imaging of the lumbar spine was performed without intravenous contrast administration. Multiplanar CT image reconstructions were also generated. COMPARISON:  None. FINDINGS: Segmentation: 5 lumbar type vertebral bodies. Alignment: No malalignment. Vertebrae and  disc levels: Ankylosis throughout the region at each intervertebral level and complete ankylosis of the sacroiliac joints, typical of ankylosing spondylitis. No evidence of regional fracture. There is chronic bony foraminal stenosis on the right at C2-3. Other foramina appear sufficiently patent. No canal stenosis. Paraspinal and other soft tissues: Negative other than aortic atherosclerosis without visible aneurysm. IMPRESSION: Chronic findings of ankylosing spondylitis with solid ankylosis throughout the region, including both sacroiliac joints. No acute or traumatic finding. 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 No other compressive canal or foraminal narrowing suspected. Electronically Signed   By: Nelson Chimes M.D.   On: 11/21/2020 15:51   MR THORACIC SPINE WO CONTRAST  Result Date: 11/22/2020 CLINICAL DATA:  Spine fracture, traumatic EXAM: MRI THORACIC SPINE WITHOUT CONTRAST TECHNIQUE: Multiplanar, multisequence MR imaging of the thoracic spine was performed. No intravenous contrast was administered. COMPARISON:  11/21/2020 CT thoracic spine FINDINGS: Evaluation is somewhat limited by inability to cross reference from the axial to sagittal series, as the localizer had to be re-run. Alignment: Physiologic. Vertebrae: Decreased T1 signal and increased T2 signal at the inferior aspect of T11 and the superior aspect of T12, concerning for acute fractures. The T11 fracture extends into the bilateral posterior elements. The T12 fracture does not appear  to involve the posterior elements. Increased T2 signal is also seen at the posteroinferior aspect of L1. No evidence of retropulsion of fracture fragments. Cord: Normal signal and morphology. No evidence of cord compression or epidural hematoma. Paraspinal and other soft tissues: Negative. Disc levels: No high-grade spinal canal stenosis. Neural foraminal narrowing at T11-T12 bilaterally, secondary to facet fracture.  IMPRESSION: 1. Increased T2 signal at the inferior aspect of T11, extending into the bilateral posterior elements, concerning for 3 column injury. 2. Increased T2 signal at the superior aspect of T12 and at the inferior posterior aspect of L1, also concerning for acute fractures, without involvement of the posterior elements. 3. No evidence of retropulsion, cord compression, or epidural hematoma. These results were called by telephone at the time of interpretation on 11/22/2020 at 4:02 am to provider [facility] , who verbally acknowledged these results. Electronically Signed   By: Merilyn Baba M.D.   On: 11/22/2020 04:04    Scheduled Meds:  dorzolamidel-timolol  1 drop Right Eye BID   latanoprost  1 drop Both Eyes QHS   loratadine  10 mg Oral Daily   losartan  50 mg Oral BID   metoprolol succinate  50 mg Oral Daily   mometasone-formoterol  2 puff Inhalation BID   rosuvastatin  10 mg Oral QHS   tamsulosin  0.8 mg Oral QPC supper   Continuous Infusions:   LOS: 1 day   Time spent: 40 minutes. More than 50% of the time was spent in counseling/coordination of care  Lorella Nimrod, MD [facility]  If 7PM-7AM, please contact night-coverage [email or web address]  11/23/2020, 2:23 PM   This record has been created using Systems analyst. Errors have been sought and corrected,but may not always be located. Such creation errors do not reflect on the standard of care.

## 2020-11-24 LAB — GLUCOSE, CAPILLARY
Glucose-Capillary: 103 mg/dL — ABNORMAL HIGH (ref 70–99)
Glucose-Capillary: 156 mg/dL — ABNORMAL HIGH (ref 70–99)
Glucose-Capillary: 109 mg/dL — ABNORMAL HIGH (ref 70–99)

## 2020-11-24 MED ORDER — POLYETHYLENE GLYCOL 3350 17 G PO PACK
17.0000 g | PACK | Freq: Every day | ORAL | Status: DC
  Administered 2020-11-24 – 2020-11-30 (×7): 17 g via ORAL
  Filled 2020-11-24 (×7): qty 1

## 2020-11-24 MED ORDER — SENNOSIDES-DOCUSATE SODIUM 8.6-50 MG PO TABS
2.0000 | ORAL_TABLET | Freq: Two times a day (BID) | ORAL | Status: DC
  Administered 2020-11-24 – 2020-11-30 (×11): 2 via ORAL
  Filled 2020-11-24 (×12): qty 2

## 2020-11-24 NOTE — Progress Notes (Signed)
Patient is on schedule for OR Monday 10/24 Will need NPO at midnight day prior and stop DVT ppx the day prior

## 2020-11-24 NOTE — TOC Progression Note (Signed)
Transition of Care [facility]) - Progression Note    Patient Details  Name: Chris Kent MRN: [id number] Date of Birth: 19-Jan-1936  Transition of Care [facility] CM/SW [place], RN Phone Number: 11/24/2020, 9:26 AM  Clinical Narrative:   Surgery Pending a Plavix Washout Anticipate Sun Or Monday.  TOC to follow to assist with Discharge after surgery         Expected Discharge Plan and Services                                                 Social Determinants of Health (SDOH) Interventions    Readmission Risk Interventions No flowsheet data found.

## 2020-11-24 NOTE — Plan of Care (Signed)

## 2020-11-24 NOTE — Progress Notes (Signed)
PROGRESS NOTE    Chris Kent  SKA:[id number] DOB: [birth year] DOA: 11/22/2020 PCP: Idelle Crouch, MD   Brief Narrative: Taken from H&P. Pt had fall resulting in possible acute fracture of the anterior bridging osteophytes of T11 and T12,. Pt was hospitalized from 9/20 to 9/28 and discharged to nursing home. He continues to have back pain after he went home. He returned to his PCP yesterday for hospital follow up and had CT scan showing unstable T11 fracture.  Rest of the imaging was without any other significant abnormality. Neurosurgery was consulted and they will take him to the OR for fusion after Plavix washout.  Last Plavix dose was on 11/21/2020.  Admitted for pain control.   At this time point, no further work up is needed. Patient's [name] score perioperative myocardial infarction or cardaic arrest is 0.89% and Revised Cardiac Risk Index Truman Hayward Criteria) is 0.90%. Chris Kent is a 85 y.o. male with medical history significant of hypertension, hyperlipidemia, asthma on 1 L oxygen, stroke, GERD, anxiety, OSA not on CPAP, PVD, BPH, who presents with back pain.  10/21: Called Dr. [name] and patient will most likely have his surgery on Monday.  Subjective: Patient was having pain only with moving around.  Pain is well controlled with rest.  Assessment & Plan:   Principal Problem:   Fracture, thoracic vertebra (HCC) Active Problems:   Hyperlipidemia   Enlarged prostate with lower urinary tract symptoms (LUTS)   Hypertension   History of stroke   Asthma   Unstable burst fracture of t11-T12 vertebra, subsequent encounter for fracture with delayed healing  Fracture, thoracic vertebra [facility]):  pt has unstable T11 vertebral fracture.  Neurosurgery was consulted and they are advising TLSO all the time and they will wait for Plavix washout, last dose was on 11/21/2020.  Not sure whether 3 or 5 days.  As 3 days are appropriate for surgery. Per Dr. [name] on phone-surgery will most  likely be on Monday. -Continue with pain management  Enlarged prostate with lower urinary tract symptoms (LUTS) -Continue Flomax   HLD: -Continue Crestor   Hypertension -Continue IV hydralazine as needed -Continue home dose of Cozaar, metoprolol,   History of stroke -Crestor -Hold Plavix  Asthma: Stable -Bronchodilators as needed.  Objective: Vitals:   11/24/20 0402 11/24/20 0732 11/24/20 1105 11/24/20 1424  BP: 124/62 131/78 (!) 92/52 (!) 114/58  Pulse: 72 68 87 79  Resp: 16 15 14 16   Temp: (!) 97.5 F (36.4 C) 98.3 F (36.8 C) (!) 97.5 F (36.4 C) 98.1 F (36.7 C)  TempSrc:      SpO2: 95% 97% 94% 98%  Weight:      Height:        Intake/Output Summary (Last 24 hours) at 11/24/2020 1646 Last data filed at 11/24/2020 0403 Gross per 24 hour  Intake --  Output 1300 ml  Net -1300 ml    Filed Weights   11/21/20 1725  Weight: 102.1 kg    Examination:  General.  Well-developed elderly man, in no acute distress. Pulmonary.  Lungs clear bilaterally, normal respiratory effort. CV.  Regular rate and rhythm, no JVD, rub or murmur. Abdomen.  Soft, nontender, nondistended, BS positive. CNS.  Alert and oriented .  No focal neurologic deficit. Extremities.  No edema, no cyanosis, pulses intact and symmetrical. Psychiatry.  Judgment and insight appears normal.     DVT prophylaxis: SCDs Code Status: Full-patient was initially partial code with DNI ordered, now agreeable to undergo intubation for  surgery. Family Communication:  Disposition Plan:  Status is: Inpatient  Remains inpatient appropriate because: Severity of illness.   Level of care: Med-Surg  All the records are reviewed and case discussed with Care Management/Social Worker. Management plans discussed with the patient, nursing and they are in agreement.  Consultants:  Neurosurgery  Procedures:  Antimicrobials:   Data Reviewed: I have personally reviewed following labs and imaging  studies  CBC: Recent Labs  Lab 11/22/20 0630 11/23/20 0647  WBC 9.9 8.4  HGB 15.7 14.4  HCT 46.9 44.0  MCV 95.1 93.4  PLT 146* 125*    Basic Metabolic Panel: Recent Labs  Lab 11/22/20 0630  NA 143  K 4.1  CL 108  CO2 30  GLUCOSE 100*  BUN 21  CREATININE 0.82  CALCIUM 8.9    GFR: Estimated Creatinine Clearance: 80.4 mL/min (by C-G formula based on SCr of 0.82 mg/dL). Liver Function Tests: No results for input(s): AST, ALT, ALKPHOS, BILITOT, PROT, ALBUMIN in the last 168 hours. No results for input(s): LIPASE, AMYLASE in the last 168 hours. No results for input(s): AMMONIA in the last 168 hours. Coagulation Profile: Recent Labs  Lab 11/22/20 0630  INR 1.1    Cardiac Enzymes: No results for input(s): CKTOTAL, CKMB, CKMBINDEX, TROPONINI in the last 168 hours. BNP (last 3 results) No results for input(s): PROBNP in the last 8760 hours. HbA1C: No results for input(s): HGBA1C in the last 72 hours. CBG: Recent Labs  Lab 11/23/20 1134 11/23/20 1743 11/23/20 2030 11/24/20 0734 11/24/20 1106  GLUCAP 92 95 109* 103* 156*    Lipid Profile: No results for input(s): CHOL, HDL, LDLCALC, TRIG, CHOLHDL, LDLDIRECT in the last 72 hours. Thyroid Function Tests: No results for input(s): TSH, T4TOTAL, FREET4, T3FREE, THYROIDAB in the last 72 hours. Anemia Panel: No results for input(s): VITAMINB12, FOLATE, FERRITIN, TIBC, IRON, RETICCTPCT in the last 72 hours. Sepsis Labs: No results for input(s): PROCALCITON, LATICACIDVEN in the last 168 hours.  Recent Results (from the past 240 hour(s))  Resp Panel by RT-PCR (Flu A&B, Covid) Nasopharyngeal Swab     Status: None   Collection Time: 11/22/20  6:30 AM   Specimen: Nasopharyngeal Swab; Nasopharyngeal(NP) swabs in vial transport medium  Result Value Ref Range Status   SARS Coronavirus 2 by RT PCR NEGATIVE NEGATIVE Final    Comment: (NOTE) SARS-CoV-2 target nucleic acids are NOT DETECTED.  The SARS-CoV-2 RNA is  generally detectable in upper respiratory specimens during the acute phase of infection. The lowest concentration of SARS-CoV-2 viral copies this assay can detect is 138 copies/mL. A negative result does not preclude SARS-Cov-2 infection and should not be used as the sole basis for treatment or other patient management decisions. A negative result may occur with  improper specimen collection/handling, submission of specimen other than nasopharyngeal swab, presence of viral mutation(s) within the areas targeted by this assay, and inadequate number of viral copies(<138 copies/mL). A negative result must be combined with clinical observations, patient history, and epidemiological information. The expected result is Negative.  Fact Sheet for Patients:  EntrepreneurPulse.com.au  Fact Sheet for Healthcare Providers:  IncredibleEmployment.be  This test is no t yet approved or cleared by the [place] FDA and  has been authorized for detection and/or diagnosis of SARS-CoV-2 by FDA under an Emergency Use Authorization (EUA). This EUA will remain  in effect (meaning this test can be used) for the duration of the COVID-19 declaration under Section 564(b)(1) of the Act, 21 U.S.C.section 360bbb-3(b)(1), unless the authorization  is terminated  or revoked sooner.       Influenza A by PCR NEGATIVE NEGATIVE Final   Influenza B by PCR NEGATIVE NEGATIVE Final    Comment: (NOTE) The Xpert Xpress SARS-CoV-2/FLU/RSV plus assay is intended as an aid in the diagnosis of influenza from Nasopharyngeal swab specimens and should not be used as a sole basis for treatment. Nasal washings and aspirates are unacceptable for Xpert Xpress SARS-CoV-2/FLU/RSV testing.  Fact Sheet for Patients: EntrepreneurPulse.com.au  Fact Sheet for Healthcare Providers: IncredibleEmployment.be  This test is not yet approved or cleared by the [place] FDA and has been authorized for detection and/or diagnosis of SARS-CoV-2 by FDA under an Emergency Use Authorization (EUA). This EUA will remain in effect (meaning this test can be used) for the duration of the COVID-19 declaration under Section 564(b)(1) of the Act, 21 U.S.C. section 360bbb-3(b)(1), unless the authorization is terminated or revoked.  Performed at [facility], [address] [place],  [address]       Radiology Studies: No results found.  Scheduled Meds:  dorzolamidel-timolol  1 drop Right Eye BID   latanoprost  1 drop Both Eyes QHS   loratadine  10 mg Oral Daily   losartan  50 mg Oral BID   metoprolol succinate  50 mg Oral Daily   mometasone-formoterol  2 puff Inhalation BID   polyethylene glycol  17 g Oral Daily   rosuvastatin  10 mg Oral QHS   senna-docusate  2 tablet Oral BID   tamsulosin  0.8 mg Oral QPC supper   Continuous Infusions:   LOS: 2 days   Time spent: 35 minutes. More than 50% of the time was spent in counseling/coordination of care  Lorella Nimrod, MD [facility]  If 7PM-7AM, please contact night-coverage [email or web address]  11/24/2020, 4:46 PM   This record has been created using Systems analyst. Errors have been sought and corrected,but may not always be located. Such creation errors do not reflect on the standard of care.

## 2020-11-25 LAB — GLUCOSE, CAPILLARY: Glucose-Capillary: 103 mg/dL — ABNORMAL HIGH (ref 70–99)

## 2020-11-25 MED ORDER — CHLORHEXIDINE GLUCONATE CLOTH 2 % EX PADS
6.0000 | MEDICATED_PAD | Freq: Every day | CUTANEOUS | Status: DC
  Administered 2020-11-25 – 2020-11-30 (×6): 6 via TOPICAL

## 2020-11-25 NOTE — Progress Notes (Signed)
Progress Note   Date: 11/25/2020  History of Present Illness:  Chris Kent is a 85 y.o with a history of BPH, HLD, HTN, GERD, CAD, and CVA on Plavix presenting with 1 month of mid back pain. He had a fall while weed eating about 1 month ago resulting in a thoracic fracture. He was subsequently placed at a SNF due to the need for additional assistance as he lives alone.  He returned to his PCP yesterday for hospital follow up and continued back pain and was subsequently sent for updated CT scan showing T11 fracture with 3 column involvement. He presented to the ER for further evaluation and treatment.  Interval Events: Patient in TLSO brace, appears comfortable in bed. Has been off Plavix for 3 days. No new or acute events  Vital Signs: Temp:  [97.3 F (36.3 C)-98.1 F (36.7 C)] 97.7 F (36.5 C) (10/22 0831) Pulse Rate:  [63-87] 72 (10/22 0831) Resp:  [14-18] 18 (10/22 0831) BP: (92-116)/(52-74) 116/65 (10/22 0831) SpO2:  [93 %-98 %] 95 % (10/22 0831) Temp (24hrs), Avg:97.6 F (36.4 C), Min:97.3 F (36.3 C), Max:98.1 F (36.7 C)  Weight: 102.1 kg   Problem List Patient Active Problem List   Diagnosis Date Noted   Unstable burst fracture of t11-T12 vertebra, subsequent encounter for fracture with delayed healing    Fracture, thoracic vertebra ([place]) 11/22/2020   History of stroke    Asthma    Fall 10/26/2020   Erectile dysfunction due to arterial insufficiency 06/29/2019   Nephrolithiasis 06/25/2018   Anxiety 11/21/2017   Asthma without status asthmaticus 11/21/2017   Esophagitis 11/21/2017   Glaucoma 11/21/2017   History of colonic polyps 11/21/2017   Hyperglycemia 11/21/2017   Hypertension 11/21/2017   Osteoarthritis 11/21/2017   Thyroid nodule 11/21/2017   Transient global amnesia 11/21/2017   Status post total bilateral knee replacement 01/12/2017   Essential hypertension, benign 03/05/2016   Hyperlipidemia 03/05/2016   Carotid stenosis 03/05/2016    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   Thrombocytopenia ([place]) 03/03/2014   Obstructive sleep apnea hypopnea, moderate 06/14/2013   Pulmonary nodules/lesions, multiple 06/14/2013   Renal cyst, acquired 04/06/2013   Enlarged prostate with lower urinary tract symptoms (LUTS) 03/18/2012   Erectile dysfunction 03/18/2012   Increased frequency of urination 03/18/2012   Nocturia 03/18/2012    Medications: Scheduled Meds:  dorzolamidel-timolol  1 drop Right Eye BID   latanoprost  1 drop Both Eyes QHS   loratadine  10 mg Oral Daily   losartan  50 mg Oral BID   metoprolol succinate  50 mg Oral Daily   mometasone-formoterol  2 puff Inhalation BID   polyethylene glycol  17 g Oral Daily   rosuvastatin  10 mg Oral QHS   senna-docusate  2 tablet Oral BID   tamsulosin  0.8 mg Oral QPC supper   Continuous Infusions: PRN Meds:.acetaminophen, albuterol, bisacodyl, dextromethorphan-guaiFENesin, fentaNYL (SUBLIMAZE) injection, hydrALAZINE, ipratropium-albuterol, methocarbamol, morphine injection, ondansetron (ZOFRAN) IV, oxyCODONE-acetaminophen, zolpidem  Labs:  Lab Results  Component Value Date   WBC 8.4 11/23/2020   WBC 9.9 11/22/2020   HCT 44.0 11/23/2020   HCT 46.9 11/22/2020   HCT 50.1 09/19/2012   HCT 50.0 03/23/2011   PLT 125 (L) 11/23/2020   PLT 146 (L) 11/22/2020   PLT 154 09/19/2012   PLT 132 (L) 03/23/2011    Lab Results  Component Value Date   INR 1.1 11/22/2020   APTT 25 11/22/2020    Lab Results  Component Value Date   NA 143 11/22/2020  NA 139 11/01/2020   NA 141 09/19/2012   NA 150 (H) 03/23/2011   K 4.1 11/22/2020   K 4.1 11/01/2020   K 4.2 09/19/2012   K 4.4 03/23/2011   BUN 21 11/22/2020   BUN 27 (H) 11/01/2020   BUN 17 09/19/2012   BUN 18 03/23/2011   No results found for: MG  Exam: General: Awake, in bed, in TLSO  Neurologic exam:  Mental status: alertness: alert,  affect: normal Speech: fluent and clear Motor:strength symmetric 5/5 in bilateral lower  extremities Sensory: intact to light touch in bilateral lower extremities Gait: untested   Imaging: CT Thoracic Spine: Background pattern of ankylosis throughout the region consistent with the diagnosis of ankylosing spondylitis. Fracture at T11 affecting the posterior elements and vertebral body with anterolisthesis of 2 mm. This would be an unstable or potentially unstable fracture.   Assessment/Plan: [name] is here with 3 column fracture at T11/12 in setting of ankylosis and need stabilization. We have discussed the roll of surgery and the post operative course including wearing brace for 3 months. He understands risks of surgery including infection, spinal cord injury, anesthesia, bleeding, death. He knows alternative of no surgery which could cause spinal cord injury.   He would like to proceed with surgery  Plan - T9-L2 fusion on 10/24 - Remain off Plavix - Keep in TLSO - Please make NPO at midnight prior to surgery - OK for DVT prophylaxis, last dose morning of 10/23    Deetta Perla, MD

## 2020-11-25 NOTE — Progress Notes (Signed)
PROGRESS NOTE    Chris Kent  UJW:[id number] DOB: July 29, 1935 DOA: 11/22/2020 PCP: Idelle Crouch, MD   Brief Narrative: Taken from H&P. Pt had fall resulting in possible acute fracture of the anterior bridging osteophytes of T11 and T12,. Pt was hospitalized from 9/20 to 9/28 and discharged to nursing home. He continues to have back pain after he went home. He returned to his PCP yesterday for hospital follow up and had CT scan showing unstable T11 fracture.  Rest of the imaging was without any other significant abnormality. Neurosurgery was consulted and they will take him to the OR for fusion after Plavix washout.  Last Plavix dose was on 11/21/2020.  Admitted for pain control.   At this time point, no further work up is needed. Patient's [name] score perioperative myocardial infarction or cardaic arrest is 0.89% and Revised Cardiac Risk Index Truman Hayward Criteria) is 0.90%. Chris Kent is a 85 y.o. male with medical history significant of hypertension, hyperlipidemia, asthma on 1 L oxygen, stroke, GERD, anxiety, OSA not on CPAP, PVD, BPH, who presents with back pain.  10/21: Called Dr. [name] and patient will most likely have his surgery on Monday.  Subjective: Pain seems well controlled while resting.  Patient is mostly staying in bed, encouraged to sit in the chair and keep the back brace on.  No other complaints.  Assessment & Plan:   Principal Problem:   Fracture, thoracic vertebra (HCC) Active Problems:   Hyperlipidemia   Enlarged prostate with lower urinary tract symptoms (LUTS)   Hypertension   History of stroke   Asthma   Unstable burst fracture of t11-T12 vertebra, subsequent encounter for fracture with delayed healing  Fracture, thoracic vertebra [facility]):  pt has unstable T11 vertebral fracture.  Neurosurgery was consulted and they are advising TLSO all the time and they will wait for Plavix washout, last dose was on 11/21/2020.   Per Dr. [name] on phone-surgery will most  likely be on Monday. -Continue with pain management  Enlarged prostate with lower urinary tract symptoms (LUTS) -Continue Flomax   HLD: -Continue Crestor   Hypertension -Continue IV hydralazine as needed -Continue home dose of Cozaar, metoprolol,   History of stroke -Crestor -Keep holding Plavix  Asthma: Stable -Bronchodilators as needed.  Objective: Vitals:   11/24/20 2017 11/25/20 0019 11/25/20 0442 11/25/20 0831  BP: (!) 100/58 107/67 107/74 116/65  Pulse: 63 67 76 72  Resp: 18 17 18 18   Temp: (!) 97.3 F (36.3 C) (!) 97.4 F (36.3 C) (!) 97.5 F (36.4 C) 97.7 F (36.5 C)  TempSrc: Oral   Oral  SpO2: 95% 95% 93% 95%  Weight:      Height:        Intake/Output Summary (Last 24 hours) at 11/25/2020 1412 Last data filed at 11/25/2020 1411 Gross per 24 hour  Intake 360 ml  Output 900 ml  Net -540 ml    Filed Weights   11/21/20 1725  Weight: 102.1 kg    Examination:  General.  Well-developed elderly man, in no acute distress. Pulmonary.  Lungs clear bilaterally, normal respiratory effort. CV.  Regular rate and rhythm, no JVD, rub or murmur. Abdomen.  Soft, nontender, nondistended, BS positive. CNS.  Alert and oriented .  No focal neurologic deficit. Extremities.  No edema, no cyanosis, pulses intact and symmetrical. Psychiatry.  Judgment and insight appears normal.   DVT prophylaxis: SCDs Code Status: Full-patient was initially partial code with DNI ordered, now agreeable to undergo intubation for  surgery. Family Communication:  Disposition Plan:  Status is: Inpatient  Remains inpatient appropriate because: Severity of illness.   Level of care: Med-Surg  All the records are reviewed and case discussed with Care Management/Social Worker. Management plans discussed with the patient, nursing and they are in agreement.  Consultants:  Neurosurgery  Procedures:  Antimicrobials:   Data Reviewed: I have personally reviewed following labs and  imaging studies  CBC: Recent Labs  Lab 11/22/20 0630 11/23/20 0647  WBC 9.9 8.4  HGB 15.7 14.4  HCT 46.9 44.0  MCV 95.1 93.4  PLT 146* 125*    Basic Metabolic Panel: Recent Labs  Lab 11/22/20 0630  NA 143  K 4.1  CL 108  CO2 30  GLUCOSE 100*  BUN 21  CREATININE 0.82  CALCIUM 8.9    GFR: Estimated Creatinine Clearance: 80.4 mL/min (by C-G formula based on SCr of 0.82 mg/dL). Liver Function Tests: No results for input(s): AST, ALT, ALKPHOS, BILITOT, PROT, ALBUMIN in the last 168 hours. No results for input(s): LIPASE, AMYLASE in the last 168 hours. No results for input(s): AMMONIA in the last 168 hours. Coagulation Profile: Recent Labs  Lab 11/22/20 0630  INR 1.1    Cardiac Enzymes: No results for input(s): CKTOTAL, CKMB, CKMBINDEX, TROPONINI in the last 168 hours. BNP (last 3 results) No results for input(s): PROBNP in the last 8760 hours. HbA1C: No results for input(s): HGBA1C in the last 72 hours. CBG: Recent Labs  Lab 11/23/20 2030 11/24/20 0734 11/24/20 1106 11/24/20 2020 11/25/20 0805  GLUCAP 109* 103* 156* 109* 103*    Lipid Profile: No results for input(s): CHOL, HDL, LDLCALC, TRIG, CHOLHDL, LDLDIRECT in the last 72 hours. Thyroid Function Tests: No results for input(s): TSH, T4TOTAL, FREET4, T3FREE, THYROIDAB in the last 72 hours. Anemia Panel: No results for input(s): VITAMINB12, FOLATE, FERRITIN, TIBC, IRON, RETICCTPCT in the last 72 hours. Sepsis Labs: No results for input(s): PROCALCITON, LATICACIDVEN in the last 168 hours.  Recent Results (from the past 240 hour(s))  Resp Panel by RT-PCR (Flu A&B, Covid) Nasopharyngeal Swab     Status: None   Collection Time: 11/22/20  6:30 AM   Specimen: Nasopharyngeal Swab; Nasopharyngeal(NP) swabs in vial transport medium  Result Value Ref Range Status   SARS Coronavirus 2 by RT PCR NEGATIVE NEGATIVE Final    Comment: (NOTE) SARS-CoV-2 target nucleic acids are NOT DETECTED.  The SARS-CoV-2 RNA  is generally detectable in upper respiratory specimens during the acute phase of infection. The lowest concentration of SARS-CoV-2 viral copies this assay can detect is 138 copies/mL. A negative result does not preclude SARS-Cov-2 infection and should not be used as the sole basis for treatment or other patient management decisions. A negative result may occur with  improper specimen collection/handling, submission of specimen other than nasopharyngeal swab, presence of viral mutation(s) within the areas targeted by this assay, and inadequate number of viral copies(<138 copies/mL). A negative result must be combined with clinical observations, patient history, and epidemiological information. The expected result is Negative.  Fact Sheet for Patients:  EntrepreneurPulse.com.au  Fact Sheet for Healthcare Providers:  IncredibleEmployment.be  This test is no t yet approved or cleared by the [place] FDA and  has been authorized for detection and/or diagnosis of SARS-CoV-2 by FDA under an Emergency Use Authorization (EUA). This EUA will remain  in effect (meaning this test can be used) for the duration of the COVID-19 declaration under Section 564(b)(1) of the Act, 21 U.S.C.section 360bbb-3(b)(1), unless the authorization  is terminated  or revoked sooner.       Influenza A by PCR NEGATIVE NEGATIVE Final   Influenza B by PCR NEGATIVE NEGATIVE Final    Comment: (NOTE) The Xpert Xpress SARS-CoV-2/FLU/RSV plus assay is intended as an aid in the diagnosis of influenza from Nasopharyngeal swab specimens and should not be used as a sole basis for treatment. Nasal washings and aspirates are unacceptable for Xpert Xpress SARS-CoV-2/FLU/RSV testing.  Fact Sheet for Patients: EntrepreneurPulse.com.au  Fact Sheet for Healthcare Providers: IncredibleEmployment.be  This test is not yet approved or cleared by the  [place] FDA and has been authorized for detection and/or diagnosis of SARS-CoV-2 by FDA under an Emergency Use Authorization (EUA). This EUA will remain in effect (meaning this test can be used) for the duration of the COVID-19 declaration under Section 564(b)(1) of the Act, 21 U.S.C. section 360bbb-3(b)(1), unless the authorization is terminated or revoked.  Performed at [facility], [address], [place], [place] [address]       Radiology Studies: No results found.  Scheduled Meds:  dorzolamidel-timolol  1 drop Right Eye BID   latanoprost  1 drop Both Eyes QHS   loratadine  10 mg Oral Daily   losartan  50 mg Oral BID   metoprolol succinate  50 mg Oral Daily   mometasone-formoterol  2 puff Inhalation BID   polyethylene glycol  17 g Oral Daily   rosuvastatin  10 mg Oral QHS   senna-docusate  2 tablet Oral BID   tamsulosin  0.8 mg Oral QPC supper   Continuous Infusions:   LOS: 3 days   Time spent: 33 minutes. More than 50% of the time was spent in counseling/coordination of care  Lorella Nimrod, MD [facility]  If 7PM-7AM, please contact night-coverage [email or web address]  11/25/2020, 2:12 PM   This record has been created using Systems analyst. Errors have been sought and corrected,but may not always be located. Such creation errors do not reflect on the standard of care.

## 2020-11-25 NOTE — Progress Notes (Signed)
Patient resting in bed, TLSO brace in place and on patient. O2 2L at night. Bed low, alarm on, call bell in reach. No needs at this time by patient.

## 2020-11-26 LAB — GLUCOSE, CAPILLARY: Glucose-Capillary: 121 mg/dL — ABNORMAL HIGH (ref 70–99)

## 2020-11-26 NOTE — Progress Notes (Signed)
Pt exhibited intermittent confusion during this shift. Family of pt called sharing the same concerns. VSS, pain well controlled this shift with 5mg  oxycodone x1, pt not impulsive, easily re-directed and re-orientated. No signs of distress, bed in low position, room near nurses station, call bell in reach, bed alarm activated and audibile

## 2020-11-26 NOTE — Plan of Care (Signed)

## 2020-11-26 NOTE — Progress Notes (Addendum)
PROGRESS NOTE    Chris Kent  UMP:[id number] DOB: 1936/01/14 DOA: 11/22/2020 PCP: Idelle Crouch, MD   Brief Narrative: Taken from H&P. Pt had fall resulting in possible acute fracture of the anterior bridging osteophytes of T11 and T12,. Pt was hospitalized from 9/20 to 9/28 and discharged to nursing home. He continues to have back pain after he went home. He returned to his PCP yesterday for hospital follow up and had CT scan showing unstable T11 fracture.  Rest of the imaging was without any other significant abnormality. Neurosurgery was consulted and they will take him to the OR for fusion after Plavix washout.  Last Plavix dose was on 11/21/2020.  Admitted for pain control.   At this time point, no further work up is needed. Patient's [name] score perioperative myocardial infarction or cardaic arrest is 0.89% and Revised Cardiac Risk Index Truman Hayward Criteria) is 0.90%. Chris Kent is a 85 y.o. male with medical history significant of hypertension, hyperlipidemia, asthma on 1 L oxygen, stroke, GERD, anxiety, OSA not on CPAP, PVD, BPH, who presents with back pain.  10/21: Called Dr. [name] and patient will most likely have his surgery on Monday.  Subjective: Patient was sitting in chair when seen during morning rounds.  Eating his breakfast.  Still having pain with body movements.  Assessment & Plan:   Principal Problem:   Fracture, thoracic vertebra (HCC) Active Problems:   Hyperlipidemia   Enlarged prostate with lower urinary tract symptoms (LUTS)   Hypertension   History of stroke   Asthma   Unstable burst fracture of t11-T12 vertebra, subsequent encounter for fracture with delayed healing  Fracture, thoracic vertebra [facility]):  pt has unstable T11 vertebral fracture.  Neurosurgery was consulted and they are advising TLSO all the time and they will wait for Plavix washout, last dose was on 11/21/2020.   Per Dr. [name] on phone-surgery will be on Monday. -N.p.o. after  midnight -Continue with pain management  Enlarged prostate with lower urinary tract symptoms (LUTS) -Continue Flomax   HLD: -Continue Crestor   Hypertension -Continue IV hydralazine as needed -Continue home dose of Cozaar, metoprolol,   History of stroke -Crestor -Keep holding Plavix  Asthma: Stable -Bronchodilators as needed.  Objective: Vitals:   11/25/20 2038 11/26/20 0421 11/26/20 0806 11/26/20 0806  BP: 118/65 117/67 (!) 99/58 (!) 99/58  Pulse: 80 69 72 73  Resp: 20 18 17 17   Temp: 98.5 F (36.9 C) 98.6 F (37 C) 98.5 F (36.9 C) 98.5 F (36.9 C)  TempSrc: Oral Oral    SpO2: 92% 98% 94% 94%  Weight:      Height:        Intake/Output Summary (Last 24 hours) at 11/26/2020 0834 Last data filed at 11/26/2020 0300 Gross per 24 hour  Intake 360 ml  Output 600 ml  Net -240 ml    Filed Weights   11/21/20 1725  Weight: 102.1 kg    Examination:  General.  Well-developed elderly man, in no acute distress. Pulmonary.  Lungs clear bilaterally, normal respiratory effort. CV.  Regular rate and rhythm, no JVD, rub or murmur. Abdomen.  Soft, nontender, nondistended, BS positive. CNS.  Alert and oriented .  No focal neurologic deficit. Extremities.  No edema, no cyanosis, pulses intact and symmetrical. Psychiatry.  Judgment and insight appears normal.   DVT prophylaxis: SCDs Code Status: Full-patient was initially partial code with DNI ordered, now agreeable to undergo intubation for surgery. Family Communication:  Disposition Plan:  Status is:  Inpatient  Remains inpatient appropriate because: Severity of illness.   Level of care: Med-Surg  All the records are reviewed and case discussed with Care Management/Social Worker. Management plans discussed with the patient, nursing and they are in agreement.  Consultants:  Neurosurgery  Procedures:  Antimicrobials:   Data Reviewed: I have personally reviewed following labs and imaging  studies  CBC: Recent Labs  Lab 11/22/20 0630 11/23/20 0647  WBC 9.9 8.4  HGB 15.7 14.4  HCT 46.9 44.0  MCV 95.1 93.4  PLT 146* 125*    Basic Metabolic Panel: Recent Labs  Lab 11/22/20 0630  NA 143  K 4.1  CL 108  CO2 30  GLUCOSE 100*  BUN 21  CREATININE 0.82  CALCIUM 8.9    GFR: Estimated Creatinine Clearance: 80.4 mL/min (by C-G formula based on SCr of 0.82 mg/dL). Liver Function Tests: No results for input(s): AST, ALT, ALKPHOS, BILITOT, PROT, ALBUMIN in the last 168 hours. No results for input(s): LIPASE, AMYLASE in the last 168 hours. No results for input(s): AMMONIA in the last 168 hours. Coagulation Profile: Recent Labs  Lab 11/22/20 0630  INR 1.1    Cardiac Enzymes: No results for input(s): CKTOTAL, CKMB, CKMBINDEX, TROPONINI in the last 168 hours. BNP (last 3 results) No results for input(s): PROBNP in the last 8760 hours. HbA1C: No results for input(s): HGBA1C in the last 72 hours. CBG: Recent Labs  Lab 11/24/20 0734 11/24/20 1106 11/24/20 2020 11/25/20 0805 11/26/20 0811  GLUCAP 103* 156* 109* 103* 121*    Lipid Profile: No results for input(s): CHOL, HDL, LDLCALC, TRIG, CHOLHDL, LDLDIRECT in the last 72 hours. Thyroid Function Tests: No results for input(s): TSH, T4TOTAL, FREET4, T3FREE, THYROIDAB in the last 72 hours. Anemia Panel: No results for input(s): VITAMINB12, FOLATE, FERRITIN, TIBC, IRON, RETICCTPCT in the last 72 hours. Sepsis Labs: No results for input(s): PROCALCITON, LATICACIDVEN in the last 168 hours.  Recent Results (from the past 240 hour(s))  Resp Panel by RT-PCR (Flu A&B, Covid) Nasopharyngeal Swab     Status: None   Collection Time: 11/22/20  6:30 AM   Specimen: Nasopharyngeal Swab; Nasopharyngeal(NP) swabs in vial transport medium  Result Value Ref Range Status   SARS Coronavirus 2 by RT PCR NEGATIVE NEGATIVE Final    Comment: (NOTE) SARS-CoV-2 target nucleic acids are NOT DETECTED.  The SARS-CoV-2 RNA is  generally detectable in upper respiratory specimens during the acute phase of infection. The lowest concentration of SARS-CoV-2 viral copies this assay can detect is 138 copies/mL. A negative result does not preclude SARS-Cov-2 infection and should not be used as the sole basis for treatment or other patient management decisions. A negative result may occur with  improper specimen collection/handling, submission of specimen other than nasopharyngeal swab, presence of viral mutation(s) within the areas targeted by this assay, and inadequate number of viral copies(<138 copies/mL). A negative result must be combined with clinical observations, patient history, and epidemiological information. The expected result is Negative.  Fact Sheet for Patients:  EntrepreneurPulse.com.au  Fact Sheet for Healthcare Providers:  IncredibleEmployment.be  This test is no t yet approved or cleared by the [place] FDA and  has been authorized for detection and/or diagnosis of SARS-CoV-2 by FDA under an Emergency Use Authorization (EUA). This EUA will remain  in effect (meaning this test can be used) for the duration of the COVID-19 declaration under Section 564(b)(1) of the Act, 21 U.S.C.section 360bbb-3(b)(1), unless the authorization is terminated  or revoked sooner.  Influenza A by PCR NEGATIVE NEGATIVE Final   Influenza B by PCR NEGATIVE NEGATIVE Final    Comment: (NOTE) The Xpert Xpress SARS-CoV-2/FLU/RSV plus assay is intended as an aid in the diagnosis of influenza from Nasopharyngeal swab specimens and should not be used as a sole basis for treatment. Nasal washings and aspirates are unacceptable for Xpert Xpress SARS-CoV-2/FLU/RSV testing.  Fact Sheet for Patients: EntrepreneurPulse.com.au  Fact Sheet for Healthcare Providers: IncredibleEmployment.be  This test is not yet approved or cleared by the [place] FDA and has been authorized for detection and/or diagnosis of SARS-CoV-2 by FDA under an Emergency Use Authorization (EUA). This EUA will remain in effect (meaning this test can be used) for the duration of the COVID-19 declaration under Section 564(b)(1) of the Act, 21 U.S.C. section 360bbb-3(b)(1), unless the authorization is terminated or revoked.  Performed at [facility], [address] [place], [place] [address]       Radiology Studies: No results found.  Scheduled Meds:  Chlorhexidine Gluconate Cloth  6 each Topical Daily   dorzolamidel-timolol  1 drop Right Eye BID   latanoprost  1 drop Both Eyes QHS   loratadine  10 mg Oral Daily   losartan  50 mg Oral BID   metoprolol succinate  50 mg Oral Daily   mometasone-formoterol  2 puff Inhalation BID   polyethylene glycol  17 g Oral Daily   rosuvastatin  10 mg Oral QHS   senna-docusate  2 tablet Oral BID   tamsulosin  0.8 mg Oral QPC supper   Continuous Infusions:   LOS: 4 days   Time spent: 34 minutes. More than 50% of the time was spent in counseling/coordination of care  Lorella Nimrod, MD [facility]  If 7PM-7AM, please contact night-coverage [email or web address]  11/26/2020, 8:34 AM   This record has been created using Systems analyst. Errors have been sought and corrected,but may not always be located. Such creation errors do not reflect on the standard of care.

## 2020-11-27 ENCOUNTER — Inpatient Hospital Stay: Payer: PPO | Admitting: Registered Nurse

## 2020-11-27 ENCOUNTER — Inpatient Hospital Stay: Payer: PPO

## 2020-11-27 ENCOUNTER — Encounter: Payer: Self-pay | Admitting: Internal Medicine

## 2020-11-27 ENCOUNTER — Encounter
Admission: EM | Disposition: A | Discharge status: Still In Hospital | Payer: Self-pay | Source: Home / Self Care | Attending: Internal Medicine

## 2020-11-27 DIAGNOSIS — Z8673 Personal history of transient ischemic attack (TIA), and cerebral infarction without residual deficits: Secondary | ICD-10-CM | POA: Diagnosis not present

## 2020-11-27 DIAGNOSIS — S22089A Unspecified fracture of T11-T12 vertebra, initial encounter for closed fracture: Secondary | ICD-10-CM | POA: Diagnosis not present

## 2020-11-27 DIAGNOSIS — S22082G Unstable burst fracture of T11-T12 vertebra, subsequent encounter for fracture with delayed healing: Secondary | ICD-10-CM | POA: Diagnosis not present

## 2020-11-27 DIAGNOSIS — N401 Enlarged prostate with lower urinary tract symptoms: Secondary | ICD-10-CM | POA: Diagnosis not present

## 2020-11-27 LAB — GLUCOSE, CAPILLARY
Glucose-Capillary: 107 mg/dL — ABNORMAL HIGH (ref 70–99)
Glucose-Capillary: 98 mg/dL (ref 70–99)

## 2020-11-27 LAB — CBC
HCT: 44.5 % (ref 39.0–52.0)
Hemoglobin: 15 g/dL (ref 13.0–17.0)
MCH: 31.7 pg (ref 26.0–34.0)
MCHC: 33.7 g/dL (ref 30.0–36.0)
MCV: 94.1 fL (ref 80.0–100.0)
Platelets: 106 10*3/uL — ABNORMAL LOW (ref 150–400)
RBC: 4.73 MIL/uL (ref 4.22–5.81)
RDW: 14.3 % (ref 11.5–15.5)
WBC: 9.3 10*3/uL (ref 4.0–10.5)
nRBC: 0 % (ref 0.0–0.2)

## 2020-11-27 LAB — URINALYSIS, COMPLETE (UACMP) WITH MICROSCOPIC
Bilirubin Urine: NEGATIVE
Glucose, UA: NEGATIVE mg/dL
Ketones, ur: NEGATIVE mg/dL
Nitrite: NEGATIVE
Protein, ur: 30 mg/dL — AB
RBC / HPF: >50 RBC/hpf — ABNORMAL HIGH (ref 0–5)
Specific Gravity, Urine: 1.02 (ref 1.005–1.030)
WBC, UA: >50 WBC/hpf — ABNORMAL HIGH (ref 0–5)
pH: 5 (ref 5.0–8.0)

## 2020-11-27 LAB — SURGICAL PCR SCREEN
MRSA, PCR: NEGATIVE
Staphylococcus aureus: NEGATIVE

## 2020-11-27 LAB — BASIC METABOLIC PANEL
Anion gap: 8 (ref 5–15)
BUN: 25 mg/dL — ABNORMAL HIGH (ref 8–23)
CO2: 27 mmol/L (ref 22–32)
Calcium: 8.8 mg/dL — ABNORMAL LOW (ref 8.9–10.3)
Chloride: 101 mmol/L (ref 98–111)
Creatinine, Ser: 0.86 mg/dL (ref 0.61–1.24)
GFR, Estimated: >60 mL/min (ref >60)
Glucose, Bld: 106 mg/dL — ABNORMAL HIGH (ref 70–99)
Potassium: 4 mmol/L (ref 3.5–5.1)
Sodium: 136 mmol/L (ref 135–145)

## 2020-11-27 LAB — TYPE AND SCREEN
ABO/RH(D): A POS
Antibody Screen: NEGATIVE

## 2020-11-27 SURGERY — POSTERIOR THORACIC FUSION 4 LEVELS
Anesthesia: General | Site: Back

## 2020-11-27 MED ORDER — PHENYLEPHRINE HCL (PRESSORS) 10 MG/ML IV SOLN
INTRAVENOUS | Status: DC | PRN
  Administered 2020-11-27: 100 ug via INTRAVENOUS
  Administered 2020-11-27 (×2): 200 ug via INTRAVENOUS
  Administered 2020-11-27: 100 ug via INTRAVENOUS

## 2020-11-27 MED ORDER — REMIFENTANIL HCL 1 MG IV SOLR
INTRAVENOUS | Status: AC
  Filled 2020-11-27: qty 2000

## 2020-11-27 MED ORDER — VANCOMYCIN HCL 1000 MG IV SOLR
INTRAVENOUS | Status: DC | PRN
  Administered 2020-11-27: 1 g via TOPICAL

## 2020-11-27 MED ORDER — ONDANSETRON HCL 4 MG/2ML IJ SOLN: 4.0000 mg | Freq: Once | INTRAMUSCULAR | Status: DC | PRN

## 2020-11-27 MED ORDER — REMIFENTANIL HCL 1 MG IV SOLR
INTRAVENOUS | Status: AC
  Filled 2020-11-27: qty 1000

## 2020-11-27 MED ORDER — MORPHINE SULFATE (PF) 2 MG/ML IV SOLN: 2.0000 mg | INTRAVENOUS | Status: DC | PRN

## 2020-11-27 MED ORDER — ONDANSETRON HCL 4 MG/2ML IJ SOLN
INTRAMUSCULAR | Status: DC | PRN
  Administered 2020-11-27: 4 mg via INTRAVENOUS

## 2020-11-27 MED ORDER — FENTANYL CITRATE (PF) 100 MCG/2ML IJ SOLN
INTRAMUSCULAR | Status: DC | PRN
  Administered 2020-11-27 (×2): 50 ug via INTRAVENOUS

## 2020-11-27 MED ORDER — OXYCODONE HCL 5 MG PO TABS
ORAL_TABLET | ORAL | Status: AC
  Administered 2020-11-27: 5 mg via ORAL
  Filled 2020-11-27: qty 1

## 2020-11-27 MED ORDER — SODIUM CHLORIDE (PF) 0.9 % IJ SOLN
INTRAMUSCULAR | Status: AC
  Filled 2020-11-27: qty 20

## 2020-11-27 MED ORDER — HYDROMORPHONE HCL 1 MG/ML IJ SOLN
INTRAMUSCULAR | Status: AC
  Filled 2020-11-27: qty 1

## 2020-11-27 MED ORDER — SODIUM CHLORIDE 0.9 % IV SOLN
INTRAVENOUS | Status: DC | PRN
  Administered 2020-11-27: .15 ug/kg/min via INTRAVENOUS

## 2020-11-27 MED ORDER — PROPOFOL 1000 MG/100ML IV EMUL
INTRAVENOUS | Status: AC
  Filled 2020-11-27: qty 100

## 2020-11-27 MED ORDER — LACTATED RINGERS IV SOLN: INTRAVENOUS | Status: DC | PRN

## 2020-11-27 MED ORDER — ONDANSETRON HCL 4 MG/2ML IJ SOLN
INTRAMUSCULAR | Status: AC
  Filled 2020-11-27: qty 2

## 2020-11-27 MED ORDER — BUPIVACAINE-EPINEPHRINE 0.5% -1:200000 IJ SOLN
INTRAMUSCULAR | Status: DC | PRN
  Administered 2020-11-27: 20 mL

## 2020-11-27 MED ORDER — METHYLPREDNISOLONE ACETATE 40 MG/ML IJ SUSP
INTRAMUSCULAR | Status: AC
  Filled 2020-11-27: qty 1

## 2020-11-27 MED ORDER — CEFAZOLIN SODIUM-DEXTROSE 1-4 GM/50ML-% IV SOLN
INTRAVENOUS | Status: DC | PRN
  Administered 2020-11-27: 2 g via INTRAVENOUS

## 2020-11-27 MED ORDER — VANCOMYCIN HCL 1000 MG IV SOLR
INTRAVENOUS | Status: AC
  Filled 2020-11-27: qty 20

## 2020-11-27 MED ORDER — SURGIFLO WITH THROMBIN (HEMOSTATIC MATRIX KIT) OPTIME
TOPICAL | Status: DC | PRN
  Administered 2020-11-27: 1 via TOPICAL

## 2020-11-27 MED ORDER — PROPOFOL 500 MG/50ML IV EMUL
INTRAVENOUS | Status: DC | PRN
  Administered 2020-11-27: 100 ug/kg/min via INTRAVENOUS

## 2020-11-27 MED ORDER — PHENYLEPHRINE HCL-NACL 20-0.9 MG/250ML-% IV SOLN
INTRAVENOUS | Status: DC | PRN
  Administered 2020-11-27: 50 ug/min via INTRAVENOUS

## 2020-11-27 MED ORDER — SUCCINYLCHOLINE CHLORIDE 200 MG/10ML IV SOSY
PREFILLED_SYRINGE | INTRAVENOUS | Status: DC | PRN
  Administered 2020-11-27: 120 mg via INTRAVENOUS

## 2020-11-27 MED ORDER — OXYCODONE HCL 5 MG/5ML PO SOLN: 5.0000 mg | Freq: Once | ORAL | Status: AC | PRN

## 2020-11-27 MED ORDER — EPHEDRINE SULFATE 50 MG/ML IJ SOLN
INTRAMUSCULAR | Status: DC | PRN
  Administered 2020-11-27 (×2): 5 mg via INTRAVENOUS

## 2020-11-27 MED ORDER — OXYCODONE HCL 5 MG PO TABS
5.0000 mg | ORAL_TABLET | Freq: Once | ORAL | Status: AC | PRN
Start: 2020-11-27 — End: 2020-11-27

## 2020-11-27 MED ORDER — DEXAMETHASONE SODIUM PHOSPHATE 10 MG/ML IJ SOLN
INTRAMUSCULAR | Status: DC | PRN
Start: 2020-11-27 — End: 2020-11-27
  Administered 2020-11-27: 10 mg via INTRAVENOUS

## 2020-11-27 MED ORDER — FENTANYL CITRATE (PF) 100 MCG/2ML IJ SOLN
INTRAMUSCULAR | Status: AC
  Filled 2020-11-27: qty 2

## 2020-11-27 MED ORDER — EPHEDRINE 5 MG/ML INJ
INTRAVENOUS | Status: AC
  Filled 2020-11-27: qty 5

## 2020-11-27 MED ORDER — BUPIVACAINE-EPINEPHRINE (PF) 0.5% -1:200000 IJ SOLN
INTRAMUSCULAR | Status: AC
  Filled 2020-11-27: qty 30

## 2020-11-27 MED ORDER — DEXAMETHASONE SODIUM PHOSPHATE 10 MG/ML IJ SOLN
INTRAMUSCULAR | Status: AC
  Filled 2020-11-27: qty 1

## 2020-11-27 MED ORDER — OXYCODONE-ACETAMINOPHEN 5-325 MG PO TABS
1.0000 | ORAL_TABLET | ORAL | Status: DC | PRN
  Administered 2020-11-28 – 2020-11-29 (×4): 1 via ORAL
  Filled 2020-11-27 (×4): qty 1

## 2020-11-27 MED ORDER — SODIUM CHLORIDE 0.9% IV SOLUTION: Freq: Once | INTRAVENOUS | Status: DC

## 2020-11-27 MED ORDER — SODIUM CHLORIDE 0.9 % IV SOLN: INTRAVENOUS | Status: DC | PRN

## 2020-11-27 MED ORDER — HYDROMORPHONE HCL 1 MG/ML IJ SOLN
INTRAMUSCULAR | Status: DC | PRN
  Administered 2020-11-27 (×2): .5 mg via INTRAVENOUS

## 2020-11-27 MED ORDER — ACETAMINOPHEN 10 MG/ML IV SOLN
INTRAVENOUS | Status: AC
  Filled 2020-11-27: qty 100

## 2020-11-27 MED ORDER — FENTANYL CITRATE (PF) 100 MCG/2ML IJ SOLN: 25.0000 ug | INTRAMUSCULAR | Status: DC | PRN

## 2020-11-27 MED ORDER — ACETAMINOPHEN 10 MG/ML IV SOLN: 1000.0000 mg | Freq: Once | INTRAVENOUS | Status: DC | PRN

## 2020-11-27 MED ORDER — LIDOCAINE HCL (CARDIAC) PF 100 MG/5ML IV SOSY
PREFILLED_SYRINGE | INTRAVENOUS | Status: DC | PRN
  Administered 2020-11-27: 100 mg via INTRAVENOUS

## 2020-11-27 MED ORDER — FENTANYL CITRATE PF 50 MCG/ML IJ SOSY
PREFILLED_SYRINGE | INTRAMUSCULAR | Status: AC
  Filled 2020-11-27: qty 1

## 2020-11-27 MED ORDER — HYDROMORPHONE HCL 1 MG/ML IJ SOLN: 0.5000 mg | INTRAMUSCULAR | Status: DC | PRN

## 2020-11-27 MED ORDER — PROPOFOL 10 MG/ML IV BOLUS
INTRAVENOUS | Status: DC | PRN
  Administered 2020-11-27: 80 mg via INTRAVENOUS

## 2020-11-27 MED ORDER — ACETAMINOPHEN 10 MG/ML IV SOLN
INTRAVENOUS | Status: DC | PRN
  Administered 2020-11-27: 1000 mg via INTRAVENOUS

## 2020-11-27 MED ORDER — OXYCODONE-ACETAMINOPHEN 5-325 MG PO TABS
2.0000 | ORAL_TABLET | ORAL | Status: DC | PRN
  Administered 2020-11-28 – 2020-11-29 (×5): 2 via ORAL
  Filled 2020-11-27 (×5): qty 2

## 2020-11-27 MED ORDER — 0.9 % SODIUM CHLORIDE (POUR BTL) OPTIME
TOPICAL | Status: DC | PRN
  Administered 2020-11-27: 1000 mL

## 2020-11-27 SURGICAL SUPPLY — 83 items
BUR NEURO DRILL SOFT 3.0X3.8M (BURR) ×2 IMPLANT
CATH COUDE FOLEY 5CC 14FR (CATHETERS) ×2 IMPLANT
CHLORAPREP W/TINT 26 (MISCELLANEOUS) ×4 IMPLANT
CORD BIP STRL DISP 12FT (MISCELLANEOUS) ×2 IMPLANT
COUNTER NEEDLE 20/40 LG (NEEDLE) ×2 IMPLANT
COVER BACK TABLE REUSABLE LG (DRAPES) ×4 IMPLANT
COVER LIGHT HANDLE STERIS (MISCELLANEOUS) ×8 IMPLANT
CUP MEDICINE 2OZ PLAST GRAD ST (MISCELLANEOUS) ×2 IMPLANT
DERMABOND ADVANCED (GAUZE/BANDAGES/DRESSINGS) ×2
DERMABOND ADVANCED .7 DNX12 (GAUZE/BANDAGES/DRESSINGS) ×2 IMPLANT
DIGITIZER BENDINI (MISCELLANEOUS) ×2 IMPLANT
DRAPE C-ARM 42X72 X-RAY (DRAPES) ×2 IMPLANT
DRAPE C-ARMOR (DRAPES) ×2 IMPLANT
DRAPE INCISE IOBAN 66X45 STRL (DRAPES) ×4 IMPLANT
DRAPE LAPAROTOMY 100X77 ABD (DRAPES) ×4 IMPLANT
DRAPE MICROSCOPE SPINE 48X150 (DRAPES) IMPLANT
DRAPE SURG 17X11 SM STRL (DRAPES) ×16 IMPLANT
DRSG OPSITE POSTOP 4X6 (GAUZE/BANDAGES/DRESSINGS) IMPLANT
DRSG TEGADERM 2-3/8X2-3/4 SM (GAUZE/BANDAGES/DRESSINGS) IMPLANT
DRSG TEGADERM 4X4.75 (GAUZE/BANDAGES/DRESSINGS) IMPLANT
DRSG TEGADERM 6X8 (GAUZE/BANDAGES/DRESSINGS) IMPLANT
DRSG TELFA 3X8 NADH (GAUZE/BANDAGES/DRESSINGS) IMPLANT
DRSG TELFA 4X3 1S NADH ST (GAUZE/BANDAGES/DRESSINGS) IMPLANT
ELECT CAUTERY BLADE TIP 2.5 (TIP) ×2
ELECT EZSTD 165MM 6.5IN (MISCELLANEOUS) ×2
ELECT REM PT RETURN 9FT ADLT (ELECTROSURGICAL) ×4
ELECTRODE CAUTERY BLDE TIP 2.5 (TIP) ×1 IMPLANT
ELECTRODE EZSTD 165MM 6.5IN (MISCELLANEOUS) ×1 IMPLANT
ELECTRODE REM PT RTRN 9FT ADLT (ELECTROSURGICAL) ×2 IMPLANT
FEE INTRAOP CADWELL SUPPLY NCS (MISCELLANEOUS) ×1 IMPLANT
FEE INTRAOP MONITOR IMPULS NCS (MISCELLANEOUS) ×1 IMPLANT
FRAME EYE SHIELD (PROTECTIVE WEAR) IMPLANT
GAUZE 4X4 16PLY ~~LOC~~+RFID DBL (SPONGE) ×4 IMPLANT
GAUZE XEROFORM 1X8 LF (GAUZE/BANDAGES/DRESSINGS) ×4 IMPLANT
GLOVE SRG 8 PF TXTR STRL LF DI (GLOVE) ×4 IMPLANT
GLOVE SURG SYN 6.5 ES PF (GLOVE) ×6 IMPLANT
GLOVE SURG SYN 8.0 (GLOVE) ×12 IMPLANT
GLOVE SURG UNDER POLY LF SZ6.5 (GLOVE) ×6 IMPLANT
GLOVE SURG UNDER POLY LF SZ8 (GLOVE) ×4
GOWN SRG LRG LVL 4 IMPRV REINF (GOWNS) ×3 IMPLANT
GOWN STRL REIN LRG LVL4 (GOWNS) ×3
GOWN STRL REUS W/ TWL XL LVL3 (GOWN DISPOSABLE) ×2 IMPLANT
GOWN STRL REUS W/TWL XL LVL3 (GOWN DISPOSABLE) ×2
GRADUATE 1200CC STRL 31836 (MISCELLANEOUS) ×2 IMPLANT
GUIDEWIRE NITINOL BEVEL TIP (WIRE) ×8 IMPLANT
HEMOVAC 400CC 10FR (MISCELLANEOUS) ×2 IMPLANT
INTRAOP CADWELL SUPPLY FEE NCS (MISCELLANEOUS) ×1
INTRAOP DISP SUPPLY FEE NCS (MISCELLANEOUS) ×1
INTRAOP MONITOR FEE IMPULS NCS (MISCELLANEOUS) ×1
INTRAOP MONITOR FEE IMPULSE (MISCELLANEOUS) ×1
KIT SPINAL PRONEVIEW (KITS) ×2 IMPLANT
KIT TURNOVER KIT A (KITS) ×2 IMPLANT
KNIFE BAYONET SHORT DISCETOMY (MISCELLANEOUS) IMPLANT
MANIFOLD NEPTUNE II (INSTRUMENTS) ×2 IMPLANT
MARKER SKIN DUAL TIP RULER LAB (MISCELLANEOUS) ×6 IMPLANT
NDL SAFETY ECLIPSE 18X1.5 (NEEDLE) IMPLANT
NEEDLE HYPO 18GX1.5 SHARP (NEEDLE)
NEEDLE HYPO 22GX1.5 SAFETY (NEEDLE) ×2 IMPLANT
NEEDLE I PASS (NEEDLE) ×4 IMPLANT
PACK LAMINECTOMY NEURO (CUSTOM PROCEDURE TRAY) ×2 IMPLANT
PAD ARMBOARD 7.5X6 YLW CONV (MISCELLANEOUS) ×6 IMPLANT
PENCIL ELECTRO HAND CTR (MISCELLANEOUS) IMPLANT
ROD RELINE MAS ST 5.5X300MM (Rod) ×4 IMPLANT
SCREW LOCK RELINE 5.5 TULIP (Screw) ×24 IMPLANT
SCREW RELINE MAS RED 5.5X45MM (Screw) ×10 IMPLANT
SCREW RELINE RED 6.5X45MM POLY (Screw) ×12 IMPLANT
SCREW SPINAL REDUCE 5.5X40 C2 (Screw) ×2 IMPLANT
SPONGE GAUZE 2X2 8PLY STRL LF (GAUZE/BANDAGES/DRESSINGS) ×2 IMPLANT
STAPLER SKIN PROX 35W (STAPLE) ×4 IMPLANT
SURGIFLO W/THROMBIN 8M KIT (HEMOSTASIS) ×2 IMPLANT
SUT ETHILON 3-0 FS-10 30 BLK (SUTURE) ×2
SUT POLYSORB 2-0 5X18 GS-10 (SUTURE) ×20 IMPLANT
SUT VIC AB 0 CT1 18XCR BRD 8 (SUTURE) ×5 IMPLANT
SUT VIC AB 0 CT1 27 (SUTURE) ×2
SUT VIC AB 0 CT1 27XCR 8 STRN (SUTURE) ×2 IMPLANT
SUT VIC AB 0 CT1 8-18 (SUTURE) ×5
SUTURE EHLN 3-0 FS-10 30 BLK (SUTURE) ×1 IMPLANT
SYR 30ML LL (SYRINGE) ×4 IMPLANT
TAPE CLOTH SURG 6X10 WHT LF (GAUZE/BANDAGES/DRESSINGS) ×2 IMPLANT
TOWEL OR 17X26 4PK STRL BLUE (TOWEL DISPOSABLE) ×6 IMPLANT
TRAY FOLEY MTR SLVR 16FR STAT (SET/KITS/TRAYS/PACK) ×2 IMPLANT
TUBING CONNECTING 10 (TUBING) ×2 IMPLANT
WATER STERILE IRR 500ML POUR (IV SOLUTION) ×2 IMPLANT

## 2020-11-27 NOTE — Op Note (Signed)
SURGERY DATE: 11/27/2020   PRE-OP DIAGNOSIS: Thoracic Spine Fracture   POST-OP DIAGNOSIS: Post-Op Diagnosis Codes:  Thoracic Spine Fracture   Procedure(s) with comments:  T9-L2 Pedicle Screw Fixation    SURGEON:  * Malen Gauze, MD  Cooper Render, PA   ANESTHESIA: General    OPERATIVE FINDINGS: Thoracic Spine Fracture    OPERATIVE REPORT:  Indication: Mr Krach was admitted to [facility] after a fall and found to have an unstable thoracic fracture. After discussion of risks and benefits of operative vs non-operative therapy, he eventually elected to proceed with surgery. He was medically treated in preparation for surgery. We discussed a T9-L2 fusion for treatment of T11-12 fracture. The risks of surgery were explained to include hematoma, infection, damage to nerve roots, CSF leak, weakness, numbness, pain, failure of fusion, need for future surgery, heart attack, and stroke. He elected to proceed with surgery for stabilization.     Procedure  The patient was brought to the OR after informed consent was obtained. He was given general anesthesia and intubated by the anesthesia service. Vascular access lines and a foley catheter were placed. Neuromonitoring electrodes were placed for SSEP and MEP.  The patient was then placed prone on a Jackson table and Wilson frame ensuring all pressure points were padded. A time-out was performed per protocol.    The patient was sterilely prepped and draped. The fluoroscopy was used to identify the T6-T12 levels. We then planned a midline incision. Local anesthetic with epinephrine was instilled into the planned incision. The skin was opened sharply and the dissection taken to the fascia.  We then made 2 paramedian incisions through the fascia in order to place percutaneous screws.   Using fluoroscopy, an entry point as placed at the lateral edge of the pedicle starting at L2 and working up to T10.  We advanced the Jamsh[place] through the  pedicle using fluoroscopy as guidance.  Once it was at a depth of 3 cm, a K wire was placed and advanced to the anterior cortex of vertebral body.  Next, screws were placed and confirmed with fluoroscopy. At T12-L2, we placed 6.5 mm x 45 mm screws.  At T9-T11, we placed 5.5 mm x 45 mm screws except for the left T10 which was 5.5 mm x 40 mm screw.  Monitoring was checked throughout the placement of the screws and remained stable.   Next, the [place] system was used to size rods and then placed on each side with set screws and torqued. Fluoroscopy confirmed good placement of the rods and adequate length. The wound was irrigated with saline and hemostasis was achieved.  A drain was exited through the skin on the left. The fascia was closed with 0 vicryl. Vancomycin powder was placed. Next, the subcutaneous layer was closed with 2-0 vicryl until the epidermis was well approximated. The skin was closed with staples. The drain was secured with suture.    The patient was returned to supine position and extubated by the anesthesia service. The patient was then taken to the PACU for post-operative care where he was moving extremities symmetrically. I discussed the results with the family and all questions were answered.    ESTIMATED BLOOD LOSS:  300 cc   IMPLANT  SCREW SPINAL REDUCE 5.5X40 C2 - [id number]  Inventory Item: SCREW SPINAL REDUCE 5.5X40 C2 Serial no.:  Model/Cat no.: [id number]  Implant name: SCREW SPINAL REDUCE 5.5X40 C2 - [id number] Laterality: N/A Area: Back   Manufacturer: [employer] Date of Manufacture:  Action: Implanted Number Used: 1   Device Identifier:  Device Identifier Type:     SCREW RELINE MAS RED 5.5X45MM - [id number]  Inventory Item: SCREW RELINE MAS RED 5.5X45MM Serial no.:  Model/Cat no.: [id number]  Implant name: SCREW RELINE MAS RED 5.5X45MM - [id number] Laterality: N/A Area: Back   Manufacturer: [employer] Date of Manufacture:    Action: Implanted Number Used: 5    Device Identifier:  Device Identifier Type:     SCREW RELINE RED 6.5X45MM POLY - [id number]  Inventory Item: SCREW RELINE RED 6.5X45MM POLY Serial no.:  Model/Cat no.: [id number]  Implant name: SCREW RELINE RED 6.5X45MM POLY - [id number] Laterality: N/A Area: Back   Manufacturer: [employer] Date of Manufacture:    Action: Implanted Number Used: 6   Device Identifier:  Device Identifier Type:     SCREW LOCK RELINE 5.5 TULIP - [id number]  Inventory Item: SCREW LOCK RELINE 5.5 TULIP Serial no.:  Model/Cat no.: [id number]  Implant name: SCREW LOCK RELINE 5.5 [place]IP - [id number] Laterality: N/A Area: Back   Manufacturer: [employer] Date of Manufacture:    Action: Implanted Number Used: 12   Device Identifier:  Device Identifier Type:     ROD RELINE MAS ST 5.5X300MM - [id number]  Inventory Item: ROD RELINE MAS ST 5.5X300MM Serial no.:  Model/Cat no.: [id number]  Implant name: ROD RELINE MAS ST 5.5X300MM - [id number] Laterality: N/A Area: Back   Manufacturer: [employer] Date of Manufacture:    Action: Implanted Number Used: 1   Device Identifier:  Device Identifier Type:       I performed the case in its entirety with assistance of PA, Ernestene Kiel, [place]

## 2020-11-27 NOTE — Progress Notes (Signed)
PROGRESS NOTE    Chris Kent  AGT:[id number] DOB: 13-Nov-1935 DOA: 11/22/2020 PCP: Idelle Crouch, MD   Brief Narrative: Taken from H&P. Pt had fall resulting in possible acute fracture of the anterior bridging osteophytes of T11 and T12,. Pt was hospitalized from 9/20 to 9/28 and discharged to nursing home. He continues to have back pain after he went home. He returned to his PCP yesterday for hospital follow up and had CT scan showing unstable T11 fracture.  Rest of the imaging was without any other significant abnormality. Neurosurgery was consulted and they will take him to the OR for fusion after Plavix washout.  Last Plavix dose was on 11/21/2020.  Admitted for pain control.   At this time point, no further work up is needed. Patient's [name] score perioperative myocardial infarction or cardaic arrest is 0.89% and Revised Cardiac Risk Index Truman Hayward Criteria) is 0.90%. Chris Kent is a 85 y.o. male with medical history significant of hypertension, hyperlipidemia, asthma on 1 L oxygen, stroke, GERD, anxiety, OSA not on CPAP, PVD, BPH, who presents with back pain.  10/21: Called Dr. [name] and patient will most likely have his surgery on Monday.  Subjective: Patient was seen and examined today.  Waiting for his surgery later in the day.  No new complaints. Later some nursing concern about dysuria.  Assessment & Plan:   Principal Problem:   Fracture, thoracic vertebra (HCC) Active Problems:   Hyperlipidemia   Enlarged prostate with lower urinary tract symptoms (LUTS)   Hypertension   History of stroke   Asthma   Unstable burst fracture of t11-T12 vertebra, subsequent encounter for fracture with delayed healing  Fracture, thoracic vertebra [facility]):  pt has unstable T11 vertebral fracture.  Neurosurgery was consulted and they are advising TLSO all the time and they will wait for Plavix washout, last dose was on 11/21/2020.  Going for spinal fusion later in the day. -Follow-up  postoperative recommendations -Continue with pain management  Dysuria.  Patient had Foley catheter in place, unable to explain when it was done. -Get UA and urine culture -Remove Foley catheter  Enlarged prostate with lower urinary tract symptoms (LUTS) -Continue Flomax   HLD: -Continue Crestor   Hypertension -Continue IV hydralazine as needed -Continue home dose of Cozaar, metoprolol,   History of stroke -Crestor -Keep holding Plavix  Asthma: Stable -Bronchodilators as needed.  Objective: Vitals:   11/27/20 0326 11/27/20 0800 11/27/20 1135 11/27/20 1526  BP: 102/60 120/70 124/64 129/63  Pulse: 71 76 70 67  Resp: 18 16 16 17   Temp: 98.2 F (36.8 C) 98.8 F (37.1 C) 97.6 F (36.4 C)   TempSrc: Oral Oral Oral   SpO2: 97%  98% 98%  Weight:      Height:        Intake/Output Summary (Last 24 hours) at 11/27/2020 1545 Last data filed at 11/27/2020 0500 Gross per 24 hour  Intake 120 ml  Output 750 ml  Net -630 ml    Filed Weights   11/21/20 1725  Weight: 102.1 kg    Examination:  General.  Well-developed gentleman, in no acute distress. Pulmonary.  Lungs clear bilaterally, normal respiratory effort. CV.  Regular rate and rhythm, no JVD, rub or murmur. Abdomen.  Soft, nontender, nondistended, BS positive. CNS.  Alert and oriented .  No focal neurologic deficit. Extremities.  No edema, no cyanosis, pulses intact and symmetrical. Psychiatry.  Judgment and insight appears normal.   DVT prophylaxis: SCDs Code Status: Full-patient was initially  partial code with DNI ordered, now agreeable to undergo intubation for surgery. Family Communication:  Disposition Plan:  Status is: Inpatient  Remains inpatient appropriate because: Severity of illness.   Level of care: Med-Surg  All the records are reviewed and case discussed with Care Management/Social Worker. Management plans discussed with the patient, nursing and they are in agreement.  Consultants:   Neurosurgery  Procedures:  Antimicrobials:   Data Reviewed: I have personally reviewed following labs and imaging studies  CBC: Recent Labs  Lab 11/22/20 0630 11/23/20 0647 11/27/20 1056  WBC 9.9 8.4 9.3  HGB 15.7 14.4 15.0  HCT 46.9 44.0 44.5  MCV 95.1 93.4 94.1  PLT 146* 125* 106*    Basic Metabolic Panel: Recent Labs  Lab 11/22/20 0630 11/27/20 1056  NA 143 136  K 4.1 4.0  CL 108 101  CO2 30 27  GLUCOSE 100* 106*  BUN 21 25*  CREATININE 0.82 0.86  CALCIUM 8.9 8.8*    GFR: Estimated Creatinine Clearance: 76.7 mL/min (by C-G formula based on SCr of 0.86 mg/dL). Liver Function Tests: No results for input(s): AST, ALT, ALKPHOS, BILITOT, PROT, ALBUMIN in the last 168 hours. No results for input(s): LIPASE, AMYLASE in the last 168 hours. No results for input(s): AMMONIA in the last 168 hours. Coagulation Profile: Recent Labs  Lab 11/22/20 0630  INR 1.1    Cardiac Enzymes: No results for input(s): CKTOTAL, CKMB, CKMBINDEX, TROPONINI in the last 168 hours. BNP (last 3 results) No results for input(s): PROBNP in the last 8760 hours. HbA1C: No results for input(s): HGBA1C in the last 72 hours. CBG: Recent Labs  Lab 11/24/20 2020 11/25/20 0805 11/26/20 0811 11/27/20 0824 11/27/20 1536  GLUCAP 109* 103* 121* 107* 98    Lipid Profile: No results for input(s): CHOL, HDL, LDLCALC, TRIG, CHOLHDL, LDLDIRECT in the last 72 hours. Thyroid Function Tests: No results for input(s): TSH, T4TOTAL, FREET4, T3FREE, THYROIDAB in the last 72 hours. Anemia Panel: No results for input(s): VITAMINB12, FOLATE, FERRITIN, TIBC, IRON, RETICCTPCT in the last 72 hours. Sepsis Labs: No results for input(s): PROCALCITON, LATICACIDVEN in the last 168 hours.  Recent Results (from the past 240 hour(s))  Resp Panel by RT-PCR (Flu A&B, Covid) Nasopharyngeal Swab     Status: None   Collection Time: 11/22/20  6:30 AM   Specimen: Nasopharyngeal Swab; Nasopharyngeal(NP) swabs in  vial transport medium  Result Value Ref Range Status   SARS Coronavirus 2 by RT PCR NEGATIVE NEGATIVE Final    Comment: (NOTE) SARS-CoV-2 target nucleic acids are NOT DETECTED.  The SARS-CoV-2 RNA is generally detectable in upper respiratory specimens during the acute phase of infection. The lowest concentration of SARS-CoV-2 viral copies this assay can detect is 138 copies/mL. A negative result does not preclude SARS-Cov-2 infection and should not be used as the sole basis for treatment or other patient management decisions. A negative result may occur with  improper specimen collection/handling, submission of specimen other than nasopharyngeal swab, presence of viral mutation(s) within the areas targeted by this assay, and inadequate number of viral copies(<138 copies/mL). A negative result must be combined with clinical observations, patient history, and epidemiological information. The expected result is Negative.  Fact Sheet for Patients:  EntrepreneurPulse.com.au  Fact Sheet for Healthcare Providers:  IncredibleEmployment.be  This test is no t yet approved or cleared by the [place] FDA and  has been authorized for detection and/or diagnosis of SARS-CoV-2 by FDA under an Emergency Use Authorization (EUA). This EUA will remain  in effect (meaning this test can be used) for the duration of the COVID-19 declaration under Section 564(b)(1) of the Act, 21 U.S.C.section 360bbb-3(b)(1), unless the authorization is terminated  or revoked sooner.       Influenza A by PCR NEGATIVE NEGATIVE Final   Influenza B by PCR NEGATIVE NEGATIVE Final    Comment: (NOTE) The Xpert Xpress SARS-CoV-2/FLU/RSV plus assay is intended as an aid in the diagnosis of influenza from Nasopharyngeal swab specimens and should not be used as a sole basis for treatment. Nasal washings and aspirates are unacceptable for Xpert Xpress SARS-CoV-2/FLU/RSV testing.  Fact  Sheet for Patients: EntrepreneurPulse.com.au  Fact Sheet for Healthcare Providers: IncredibleEmployment.be  This test is not yet approved or cleared by the [place] FDA and has been authorized for detection and/or diagnosis of SARS-CoV-2 by FDA under an Emergency Use Authorization (EUA). This EUA will remain in effect (meaning this test can be used) for the duration of the COVID-19 declaration under Section 564(b)(1) of the Act, 21 U.S.C. section 360bbb-3(b)(1), unless the authorization is terminated or revoked.  Performed at [facility], [address], [place], [place] [address]       Radiology Studies: No results found.  Scheduled Meds:  [MAR Hold] Chlorhexidine Gluconate Cloth  6 each Topical Daily   [MAR Hold] dorzolamidel-timolol  1 drop Right Eye BID   [MAR Hold] latanoprost  1 drop Both Eyes QHS   [MAR Hold] loratadine  10 mg Oral Daily   [MAR Hold] losartan  50 mg Oral BID   [MAR Hold] metoprolol succinate  50 mg Oral Daily   [MAR Hold] mometasone-formoterol  2 puff Inhalation BID   [MAR Hold] polyethylene glycol  17 g Oral Daily   [MAR Hold] rosuvastatin  10 mg Oral QHS   [MAR Hold] senna-docusate  2 tablet Oral BID   [MAR Hold] tamsulosin  0.8 mg Oral QPC supper   Continuous Infusions:   LOS: 5 days   Time spent: 35 minutes. More than 50% of the time was spent in counseling/coordination of care  Lorella Nimrod, MD [facility]  If 7PM-7AM, please contact night-coverage [email or web address]  11/27/2020, 3:45 PM   This record has been created using Systems analyst. Errors have been sought and corrected,but may not always be located. Such creation errors do not reflect on the standard of care.

## 2020-11-27 NOTE — Progress Notes (Signed)
Progress Note   Date: 11/27/2020  History of Present Illness:  KAYLAN Obrecht is a 85 y.o with a history of BPH, HLD, HTN, GERD, CAD, and CVA on Plavix presenting with 1 month of mid back pain. He had a fall while weed eating about 1 month ago resulting in a thoracic fracture. He was subsequently placed at a SNF due to the need for additional assistance as he lives alone.  He returned to his PCP yesterday for hospital follow up and continued back pain and was subsequently sent for updated CT scan showing T11 fracture with 3 column involvement. He presented to the ER for further evaluation and treatment.  Interval Events: Patient in TLSO brace, appears comfortable in bed. Has been off Plavix for 5 days. No new or acute events.   Vital Signs: Temp:  [97.5 F (36.4 C)-98.8 F (37.1 C)] 98.8 F (37.1 C) (10/24 0800) Pulse Rate:  [69-76] 76 (10/24 0800) Resp:  [14-18] 16 (10/24 0800) BP: (101-120)/(56-70) 120/70 (10/24 0800) SpO2:  [78 %-97 %] 97 % (10/24 0326) Temp (24hrs), Avg:98.1 F (36.7 C), Min:97.5 F (36.4 C), Max:98.8 F (37.1 C)  Weight: 102.1 kg   Problem List Patient Active Problem List   Diagnosis Date Noted   Unstable burst fracture of t11-T12 vertebra, subsequent encounter for fracture with delayed healing    Fracture, thoracic vertebra ([place]) 11/22/2020   History of stroke    Asthma    Fall 10/26/2020   Erectile dysfunction due to arterial insufficiency 06/29/2019   Nephrolithiasis 06/25/2018   Anxiety 11/21/2017   Asthma without status asthmaticus 11/21/2017   Esophagitis 11/21/2017   Glaucoma 11/21/2017   History of colonic polyps 11/21/2017   Hyperglycemia 11/21/2017   Hypertension 11/21/2017   Osteoarthritis 11/21/2017   Thyroid nodule 11/21/2017   Transient global amnesia 11/21/2017   Status post total bilateral knee replacement 01/12/2017   Essential hypertension, benign 03/05/2016   Hyperlipidemia 03/05/2016   Carotid stenosis 03/05/2016    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   Thrombocytopenia ([place]) 03/03/2014   Obstructive sleep apnea hypopnea, moderate 06/14/2013   Pulmonary nodules/lesions, multiple 06/14/2013   Renal cyst, acquired 04/06/2013   Enlarged prostate with lower urinary tract symptoms (LUTS) 03/18/2012   Erectile dysfunction 03/18/2012   Increased frequency of urination 03/18/2012   Nocturia 03/18/2012    Medications: Scheduled Meds:  Chlorhexidine Gluconate Cloth  6 each Topical Daily   dorzolamidel-timolol  1 drop Right Eye BID   latanoprost  1 drop Both Eyes QHS   loratadine  10 mg Oral Daily   losartan  50 mg Oral BID   metoprolol succinate  50 mg Oral Daily   mometasone-formoterol  2 puff Inhalation BID   polyethylene glycol  17 g Oral Daily   rosuvastatin  10 mg Oral QHS   senna-docusate  2 tablet Oral BID   tamsulosin  0.8 mg Oral QPC supper   Continuous Infusions: PRN Meds:.acetaminophen, albuterol, bisacodyl, dextromethorphan-guaiFENesin, fentaNYL (SUBLIMAZE) injection, hydrALAZINE, ipratropium-albuterol, methocarbamol, morphine injection, ondansetron (ZOFRAN) IV, oxyCODONE-acetaminophen, zolpidem  Labs:  Lab Results  Component Value Date   WBC 8.4 11/23/2020   WBC 9.9 11/22/2020   HCT 44.0 11/23/2020   HCT 46.9 11/22/2020   HCT 50.1 09/19/2012   HCT 50.0 03/23/2011   PLT 125 (L) 11/23/2020   PLT 146 (L) 11/22/2020   PLT 154 09/19/2012   PLT 132 (L) 03/23/2011    Lab Results  Component Value Date   INR 1.1 11/22/2020   APTT 25 11/22/2020  Lab Results  Component Value Date   NA 143 11/22/2020   NA 139 11/01/2020   NA 141 09/19/2012   NA 150 (H) 03/23/2011   K 4.1 11/22/2020   K 4.1 11/01/2020   K 4.2 09/19/2012   K 4.4 03/23/2011   BUN 21 11/22/2020   BUN 27 (H) 11/01/2020   BUN 17 09/19/2012   BUN 18 03/23/2011   No results found for: MG  Exam: General: Awake, in bed, in TLSO  Neurologic exam:  Mental status: alertness: alert,  affect: normal Speech: fluent and  clear Motor:strength symmetric 5/5 in bilateral lower extremities Sensory: intact to light touch in bilateral lower extremities Gait: untested   Imaging: CT Thoracic Spine: Background pattern of ankylosis throughout the region consistent with the diagnosis of ankylosing spondylitis. Fracture at T11 affecting the posterior elements and vertebral body with anterolisthesis of 2 mm. This would be an unstable or potentially unstable fracture.   Assessment/Plan: Mr Devera is here with 3 column fracture at T11/12 in setting of ankylosis and need stabilization. Plan for surgery 11/27/20  Plan - T9-L2 fusion on 10/24 - Remain off Plavix - Keep in TLSO - Please make NPO at midnight prior to surgery - OK for DVT prophylaxis, last dose morning of 10/23  Loleta Dicker, PA-C Neurosurgery

## 2020-11-27 NOTE — Anesthesia Procedure Notes (Signed)
Procedure Name: Intubation Date/Time: 11/27/2020 6:26 PM Performed by: Arita Miss, MD Pre-anesthesia Checklist: Patient identified, Patient being monitored, Timeout performed, Emergency Drugs available and Suction available Patient Re-evaluated:Patient Re-evaluated prior to induction Oxygen Delivery Method: Circle system utilized Preoxygenation: Pre-oxygenation with 100% oxygen Induction Type: IV induction Ventilation: Mask ventilation without difficulty Laryngoscope Size: McGraph and 4 Grade View: Grade II Tube type: Oral Tube size: 7.0 mm Number of attempts: 2 Airway Equipment and Method: Stylet and Bougie stylet (driveable bougie) Placement Confirmation: ETT inserted through vocal cords under direct vision, positive ETCO2 and breath sounds checked- equal and bilateral Secured at: 21 cm Tube secured with: Tape Dental Injury: Teeth and Oropharynx as per pre-operative assessment  Difficulty Due To: Difficulty was anticipated and Difficult Airway- due to reduced neck mobility Future Recommendations: Recommend- induction with short-acting agent, and alternative techniques readily available Comments: Modified RSI performed, easy mask ventilation after induction. First attempt by CRNA with Grade 2b view, resulting in esophageal intubation immediately recognized. ETT withdrawn, mask ventilated easily once again. Second attempt successfully performed by MD, with Grade 2a view; driveable bougie utilized and advanced gently past the cords, and ETT slid over. +etco2, breath sound equal bilaterally.

## 2020-11-27 NOTE — Plan of Care (Signed)
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
## 2020-11-27 NOTE — Discharge Instructions (Signed)
NEUROSURGERY DISCHARGE INSTRUCTIONS  Admission diagnosis: Fracture, thoracic vertebra ([place]) [S22.009A] Unstable burst fracture of t11-T12 vertebra, subsequent encounter for fracture with delayed healing [S22.082G]  Operative procedure: T9-L2 spinal fusion  What to do after you leave the hospital:  Recommended diet: regular diet. Increase protein intake to promote wound healing.  Recommended activity: no lifting, driving, or strenuous exercise for 4 weeks . You should walk multiple times per day  Special Instructions  No straining, no heavy lifting > 10lbs x 4 weeks.  Keep incision area clean and dry. May shower in 2 days. No baths or pools for 6 weeks.  Please remove dressing tomorrow, no need to apply a bandage afterwards  You have sutures or staples that will be removed in clinic.   Please take pain medications as directed. Take a stool softener if on pain medications   Please Report any of the following: Nausea or Vomiting, Temperature is greater than 101.67F (38.1C) degrees, Dizziness, Abdominal Pain, Difficulty Breathing or Shortness of Breath, Inability to Eat, drink Fluids, or Take medications, Bleeding, swelling, or drainage from surgical incision sites, New numbness or weakness, and Bowel or bladder dysfunction to the neurosurgeon on call at [phone]  Additional Follow up appointments Please follow up with Cooper Render PA-C in [place] [facility] as scheduled in 2-3 weeks   Please see below for scheduled appointments:  Future Appointments  Date Time Provider [place]  06/29/2021 11:15 AM Stoioff, Ronda Fairly, MD [facility] None

## 2020-11-27 NOTE — Anesthesia Procedure Notes (Signed)
Arterial Line Insertion Start/End10/24/2022 6:04 PM, 11/27/2020 6:06 PM Performed by: Arita Miss, MD, Lily Peer Keyonta Madrid, CRNA, CRNA  Patient location: OR. Preanesthetic checklist: patient identified, IV checked, site marked, risks and benefits discussed, surgical consent, monitors and equipment checked, pre-op evaluation and anesthesia consent Patient sedated Right, radial was placed Catheter size: 20 G Hand hygiene performed  and maximum sterile barriers used   Attempts: 1 Procedure performed without using ultrasound guided technique. Following insertion, dressing applied. Post procedure assessment: normal and unchanged  Patient tolerated the procedure well with no immediate complications.

## 2020-11-27 NOTE — Anesthesia Postprocedure Evaluation (Signed)
Anesthesia Post Note  Patient: Chris Kent  Procedure(s) Performed: POSTERIOR THORACIC FUSION 4 LEVELS T9-L2 (Back)  Patient location during evaluation: PACU Anesthesia Type: General Level of consciousness: awake and alert Pain management: pain level controlled Vital Signs Assessment: post-procedure vital signs reviewed and stable Respiratory status: spontaneous breathing, nonlabored ventilation, respiratory function stable and patient connected to nasal cannula oxygen Cardiovascular status: blood pressure returned to baseline and stable Postop Assessment: no apparent nausea or vomiting Anesthetic complications: no   No notable events documented.   Last Vitals:  Vitals:   11/27/20 2200 11/27/20 2215  BP: 123/66 135/62  Pulse: 81 82  Resp: 14 12  Temp: 36.5 C   SpO2: 97% 99%    Last Pain:  Vitals:   11/27/20 2215  TempSrc:   PainSc: Asleep                 Arita Miss

## 2020-11-27 NOTE — Transfer of Care (Signed)
Immediate Anesthesia Transfer of Care Note  Patient: Aron Baba  Procedure(s) Performed: POSTERIOR THORACIC FUSION 4 LEVELS T9-L2 (Back)  Patient Location: PACU  Anesthesia Type:General  Level of Consciousness: awake  Airway & Oxygen Therapy: Patient Spontanous Breathing and Patient connected to face mask oxygen  Post-op Assessment: Report given to RN and Post -op Vital signs reviewed and stable  Post vital signs: Reviewed and stable  Last Vitals:  Vitals Value Taken Time  BP 114/63 11/27/20 2158  Temp    Pulse 81 11/27/20 2200  Resp 14 11/27/20 2200  SpO2 97 % 11/27/20 2200  Vitals shown include unvalidated device data.  Last Pain:  Vitals:   11/27/20 1526  TempSrc:   PainSc: 0-No pain      Patients Stated Pain Goal: 2 (95/58/31 6742)  Complications: No notable events documented.

## 2020-11-28 ENCOUNTER — Inpatient Hospital Stay: Payer: PPO

## 2020-11-28 DIAGNOSIS — Z8673 Personal history of transient ischemic attack (TIA), and cerebral infarction without residual deficits: Secondary | ICD-10-CM | POA: Diagnosis not present

## 2020-11-28 DIAGNOSIS — N401 Enlarged prostate with lower urinary tract symptoms: Secondary | ICD-10-CM | POA: Diagnosis not present

## 2020-11-28 DIAGNOSIS — S22082G Unstable burst fracture of T11-T12 vertebra, subsequent encounter for fracture with delayed healing: Secondary | ICD-10-CM | POA: Diagnosis not present

## 2020-11-28 DIAGNOSIS — S22089A Unspecified fracture of T11-T12 vertebra, initial encounter for closed fracture: Secondary | ICD-10-CM | POA: Diagnosis not present

## 2020-11-28 LAB — BPAM PLATELET PHERESIS
Blood Product Expiration Date: 202210272359
ISSUE DATE / TIME: 202210241828
Unit Type and Rh: 5100

## 2020-11-28 LAB — GLUCOSE, CAPILLARY
Glucose-Capillary: 178 mg/dL — ABNORMAL HIGH (ref 70–99)
Glucose-Capillary: 140 mg/dL — ABNORMAL HIGH (ref 70–99)

## 2020-11-28 LAB — PREPARE PLATELET PHERESIS: Unit division: 0

## 2020-11-28 NOTE — Evaluation (Addendum)
Physical Therapy Evaluation Patient Details Name: Chris Kent MRN: [id number] DOB: [birth year] Today's Date: 11/28/2020  History of Present Illness  Pt is a 85 y.o. 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  Recent admission to SNF following a fall 10/26/20. PMH includes COPD (1L O2 at night), PVD, HTN.   Clinical Impression  Pt in bed lying with TLSO loosely applied behind patient. Pt oriented x 4 and notes 4-5/10 pain at rest. Pt states prior to initial fall around 9/22, independence with all activities and ADLs. Pt lives alone, but has several family members who will assist with a transition home in order to provide 24/7 care if necessary. Pt's primary limitations noted this session are weakness, decreased ROM, pain, and change in functional status requiring MIN-A for bed mobility and CGA for transitions w/ RW. Pt was able to demonstrate safety with stair training w/ RW, CGA without LOB. Pt ambulates with forwardly flexed trunk at baselines but is able to recall technique corrections mentioned from previous PT sessions upon past hospital admission. Pt was taught donning and doffing of TLSO and does require support with technique at this time. HHPT is recommenced at discharge as support is available if necessary and to continue maximizing functional mobility.     Recommendations for follow up therapy are one component of a multi-disciplinary discharge planning process, led by the attending physician.  Recommendations may be updated based on patient status, additional functional criteria and insurance authorization.  Follow Up Recommendations Home health PT    Assistance Recommended at Discharge Intermittent Supervision/Assistance  Functional Status Assessment Patient has had a recent decline in their functional status and demonstrates the ability to make significant improvements  in function in a reasonable and predictable amount of time.  Equipment Recommendations  3in1 (PT)    Recommendations for Other Services   OT consult    Precautions / Restrictions Precautions Precautions: Fall;Back Required Braces or Orthoses: Spinal Brace Spinal Brace: Thoracolumbosacral orthotic Restrictions Weight Bearing Restrictions: No Other Position/Activity Restrictions: TLSO for OOB.      Mobility  Bed Mobility Overal bed mobility: Needs Assistance Bed Mobility: Rolling;Sidelying to Sit Rolling: Min guard Sidelying to sit: Min assist;HOB elevated       General bed mobility comments: physical assist for trunk    Transfers Overall transfer level: Needs assistance Equipment used: Rolling walker (2 wheels) Transfers: Sit to/from Stand Sit to Stand: Min guard           General transfer comment: STS x 3, cues for hand placement on RW x 2    Ambulation/Gait Ambulation/Gait assistance: Supervision Gait Distance (Feet): 65 Feet Assistive device: Rolling walker (2 wheels) Gait Pattern/deviations: Decreased step length - right;Decreased step length - left;Decreased dorsiflexion - right;Decreased dorsiflexion - left;Decreased stride length;Trunk flexed Gait velocity: decreased   General Gait Details: No rest breaks, cues to keep RW inside BOS  Stairs Stairs: Yes Stairs assistance: Min guard Stair Management: No rails;Forwards;With walker Number of Stairs: 1 General stair comments: min-G for safety and cues to get close to the step  Wheelchair Mobility    Modified Rankin (Stroke Patients Only)       Balance Overall balance assessment: Needs assistance Sitting-balance support: Single extremity supported;Feet supported Sitting balance-Leahy Scale: Good     Standing balance support: Bilateral upper extremity supported;During functional activity Standing balance-Leahy Scale: Fair Standing balance comment: Pt is able to  stand without UE support briefly  but can not tolerate challenges                             Pertinent Vitals/Pain Pain Assessment: 0-10 Pain Score: 5  Pain Location: Back Pain Descriptors / Indicators: Aching;Sore Pain Intervention(s): Limited activity within patient's tolerance;Monitored during session;Repositioned;Patient requesting pain meds-RN notified    Home Living Family/patient expects to be discharged to:: Private residence Living Arrangements: Alone Available Help at Discharge: Family;Available 24 hours/day Type of Home: House Home Access: Stairs to enter Entrance Stairs-Rails: None Entrance Stairs-Number of Steps: 1   Home Layout: One level Home Equipment: Conservation officer, nature (2 wheels)      Prior Function Prior Level of Function : Independent/Modified Independent             Mobility Comments: Prior to fall outside 9/22 pt was independent for all activities and mobility including driving       Hand Dominance        Extremity/Trunk Assessment   Upper Extremity Assessment Upper Extremity Assessment: Generalized weakness    Lower Extremity Assessment Lower Extremity Assessment: Generalized weakness    Cervical / Trunk Assessment Cervical / Trunk Assessment: Kyphotic  Communication   Communication: No difficulties  Cognition Arousal/Alertness: Awake/alert Behavior During Therapy: Flat affect;WFL for tasks assessed/performed Overall Cognitive Status: Within Functional Limits for tasks assessed                                 General Comments: cooperative, follows one step commands        General Comments      Exercises Other Exercises Other Exercises: reassembled TLSO, placed, and fitted. Other Exercises: Pt edu: TLSO usage and precautions with teach back on application of brace   Assessment/Plan    PT Assessment Patient needs continued PT services  PT Problem List Decreased strength;Decreased mobility;Decreased activity tolerance;Decreased  knowledge of use of DME;Decreased balance;Decreased range of motion       PT Treatment Interventions DME instruction;Therapeutic exercise;Gait training;Balance training;Stair training;Neuromuscular re-education;Functional mobility training;Therapeutic activities;Patient/family education    PT Goals (Current goals can be found in the Care Plan section)  Acute Rehab PT Goals Patient Stated Goal: to go home PT Goal Formulation: With patient Time For Goal Achievement: 12/12/20 Potential to Achieve Goals: Good    Frequency 7X/week   Barriers to discharge        Co-evaluation               AM-PAC PT "6 Clicks" Mobility  Outcome Measure Help needed turning from your back to your side while in a flat bed without using bedrails?: None Help needed moving from lying on your back to sitting on the side of a flat bed without using bedrails?: A Little Help needed moving to and from a bed to a chair (including a wheelchair)?: A Little Help needed standing up from a chair using your arms (e.g., wheelchair or bedside chair)?: A Little Help needed to walk in hospital room?: A Little Help needed climbing 3-5 steps with a railing? : A Little 6 Click Score: 19    End of Session Equipment Utilized During Treatment: Gait belt;Back brace Activity Tolerance: Patient tolerated treatment well Patient left: in chair;with call bell/phone within reach;with chair alarm set;with nursing/sitter in room Nurse Communication: Mobility status PT Visit Diagnosis: Muscle weakness (generalized) (M62.81);Pain;Difficulty in walking, not elsewhere classified (R26.2)  Time: 0904-1000 PT Time Calculation (min) (ACUTE ONLY): 56 min   Charges:            [employer], SPT

## 2020-11-28 NOTE — Progress Notes (Signed)
PROGRESS NOTE    Chris Kent  QZR:[id number] DOB: July 22, 1935 DOA: 11/22/2020 PCP: Idelle Crouch, MD   Brief Narrative: Taken from H&P. Pt had fall resulting in possible acute fracture of the anterior bridging osteophytes of T11 and T12,. Pt was hospitalized from 9/20 to 9/28 and discharged to nursing home. He continues to have back pain after he went home. He returned to his PCP yesterday for hospital follow up and had CT scan showing unstable T11 fracture.  Rest of the imaging was without any other significant abnormality. Neurosurgery was consulted and they will take him to the OR for fusion after Plavix washout.  Last Plavix dose was on 11/21/2020.  Admitted for pain control.   At this time point, no further work up is needed. Patient's [name] score perioperative myocardial infarction or cardaic arrest is 0.89% and Revised Cardiac Risk Index [place] Criteria) is 0.90%. Chris Kent is a 85 y.o. male with medical history significant of hypertension, hyperlipidemia, asthma on 1 L oxygen, stroke, GERD, anxiety, OSA not on CPAP, PVD, BPH, who presents with back pain.  10/21: Called Dr. [name] and patient will most likely have his surgery on Monday.  10/25: Patient underwent NA EO with neurosurgery yesterday, 11/27/2020.  Tolerated the procedure well.  Neurosurgery is now recommending TLSO with ambulation only.  Plavix can be resumed on 12/12/2020.  PT/OT evaluation for disposition  Subjective: Patient was complaining of 10/10 pain at the time of morning rounds, he just finished working up with PT.  Sitting in chair.  Assessment & Plan:   Principal Problem:   Fracture, thoracic vertebra (HCC) Active Problems:   Hyperlipidemia   Enlarged prostate with lower urinary tract symptoms (LUTS)   Hypertension   History of stroke   Asthma   Unstable burst fracture of t11-T12 vertebra, subsequent encounter for fracture with delayed healing  Fracture, thoracic vertebra [facility]):  pt has  unstable T11 vertebral fracture.  Neurosurgery was consulted and they are advising TLSO all the time and they will wait for Plavix washout, last dose was on 11/21/2020.  Underwent an AAE O with neurosurgery yesterday. -Neurosurgery is recommending resuming Plavix on 12/12/2020 and TLSO only with ambulation. -PT/OT evaluation -Continue with pain management  Dysuria.  Patient had Foley catheter in place, unable to explain when it was done.  Foley was removed yesterday.  UA looks infected, urine cultures pending.  Remained afebrile -Follow-up urine culture results  Enlarged prostate with lower urinary tract symptoms (LUTS) -Continue Flomax   HLD: -Continue Crestor   Hypertension -Continue IV hydralazine as needed -Continue home dose of Cozaar, metoprolol,   History of stroke -Crestor -Keep holding Plavix-can restart on 12/12/2020 per neurosurgery recommendations  Asthma: Stable -Bronchodilators as needed.  Objective: Vitals:   11/27/20 2245 11/27/20 2330 11/28/20 0458 11/28/20 0850  BP: 136/76 (!) 146/82 135/78 (!) 143/70  Pulse: 83 72 85 82  Resp: 18 18 16 16   Temp: 98 F (36.7 C) 97.9 F (36.6 C) 98.4 F (36.9 C)   TempSrc:   Oral   SpO2: 97% (!) 86% 98% 95%  Weight:      Height:        Intake/Output Summary (Last 24 hours) at 11/28/2020 1453 Last data filed at 11/28/2020 0710 Gross per 24 hour  Intake 1926 ml  Output 755 ml  Net 1171 ml    Filed Weights   11/21/20 1725  Weight: 102.1 kg    Examination:  General.  Well-developed elderly man, in no acute  distress. Pulmonary.  Lungs clear bilaterally, normal respiratory effort. CV.  Regular rate and rhythm, no JVD, rub or murmur. Abdomen.  Soft, nontender, nondistended, BS positive. CNS.  Alert and oriented .  No focal neurologic deficit. Extremities.  No edema, no cyanosis, pulses intact and symmetrical. Psychiatry.  Judgment and insight appears normal.   DVT prophylaxis: SCDs Code Status: Full-patient was  initially partial code with DNI ordered, now agreeable to undergo intubation for surgery. Family Communication:  Disposition Plan:  Status is: Inpatient  Remains inpatient appropriate because: Severity of illness.   Level of care: Med-Surg  All the records are reviewed and case discussed with Care Management/Social Worker. Management plans discussed with the patient, nursing and they are in agreement.  Consultants:  Neurosurgery  Procedures:  Antimicrobials:   Data Reviewed: I have personally reviewed following labs and imaging studies  CBC: Recent Labs  Lab 11/22/20 0630 11/23/20 0647 11/27/20 1056  WBC 9.9 8.4 9.3  HGB 15.7 14.4 15.0  HCT 46.9 44.0 44.5  MCV 95.1 93.4 94.1  PLT 146* 125* 106*    Basic Metabolic Panel: Recent Labs  Lab 11/22/20 0630 11/27/20 1056  NA 143 136  K 4.1 4.0  CL 108 101  CO2 30 27  GLUCOSE 100* 106*  BUN 21 25*  CREATININE 0.82 0.86  CALCIUM 8.9 8.8*    GFR: Estimated Creatinine Clearance: 76.7 mL/min (by C-G formula based on SCr of 0.86 mg/dL). Liver Function Tests: No results for input(s): AST, ALT, ALKPHOS, BILITOT, PROT, ALBUMIN in the last 168 hours. No results for input(s): LIPASE, AMYLASE in the last 168 hours. No results for input(s): AMMONIA in the last 168 hours. Coagulation Profile: Recent Labs  Lab 11/22/20 0630  INR 1.1    Cardiac Enzymes: No results for input(s): CKTOTAL, CKMB, CKMBINDEX, TROPONINI in the last 168 hours. BNP (last 3 results) No results for input(s): PROBNP in the last 8760 hours. HbA1C: No results for input(s): HGBA1C in the last 72 hours. CBG: Recent Labs  Lab 11/25/20 0805 11/26/20 0811 11/27/20 0824 11/27/20 1536 11/28/20 0853  GLUCAP 103* 121* 107* 98 178*    Lipid Profile: No results for input(s): CHOL, HDL, LDLCALC, TRIG, CHOLHDL, LDLDIRECT in the last 72 hours. Thyroid Function Tests: No results for input(s): TSH, T4TOTAL, FREET4, T3FREE, THYROIDAB in the last 72  hours. Anemia Panel: No results for input(s): VITAMINB12, FOLATE, FERRITIN, TIBC, IRON, RETICCTPCT in the last 72 hours. Sepsis Labs: No results for input(s): PROCALCITON, LATICACIDVEN in the last 168 hours.  Recent Results (from the past 240 hour(s))  Resp Panel by RT-PCR (Flu A&B, Covid) Nasopharyngeal Swab     Status: None   Collection Time: 11/22/20  6:30 AM   Specimen: Nasopharyngeal Swab; Nasopharyngeal(NP) swabs in vial transport medium  Result Value Ref Range Status   SARS Coronavirus 2 by RT PCR NEGATIVE NEGATIVE Final    Comment: (NOTE) SARS-CoV-2 target nucleic acids are NOT DETECTED.  The SARS-CoV-2 RNA is generally detectable in upper respiratory specimens during the acute phase of infection. The lowest concentration of SARS-CoV-2 viral copies this assay can detect is 138 copies/mL. A negative result does not preclude SARS-Cov-2 infection and should not be used as the sole basis for treatment or other patient management decisions. A negative result may occur with  improper specimen collection/handling, submission of specimen other than nasopharyngeal swab, presence of viral mutation(s) within the areas targeted by this assay, and inadequate number of viral copies(<138 copies/mL). A negative result must be combined with  clinical observations, patient history, and epidemiological information. The expected result is Negative.  Fact Sheet for Patients:  EntrepreneurPulse.com.au  Fact Sheet for Healthcare Providers:  IncredibleEmployment.be  This test is no t yet approved or cleared by the [place] FDA and  has been authorized for detection and/or diagnosis of SARS-CoV-2 by FDA under an Emergency Use Authorization (EUA). This EUA will remain  in effect (meaning this test can be used) for the duration of the COVID-19 declaration under Section 564(b)(1) of the Act, 21 U.S.C.section 360bbb-3(b)(1), unless the authorization is  terminated  or revoked sooner.       Influenza A by PCR NEGATIVE NEGATIVE Final   Influenza B by PCR NEGATIVE NEGATIVE Final    Comment: (NOTE) The Xpert Xpress SARS-CoV-2/FLU/RSV plus assay is intended as an aid in the diagnosis of influenza from Nasopharyngeal swab specimens and should not be used as a sole basis for treatment. Nasal washings and aspirates are unacceptable for Xpert Xpress SARS-CoV-2/FLU/RSV testing.  Fact Sheet for Patients: EntrepreneurPulse.com.au  Fact Sheet for Healthcare Providers: IncredibleEmployment.be  This test is not yet approved or cleared by the [place] FDA and has been authorized for detection and/or diagnosis of SARS-CoV-2 by FDA under an Emergency Use Authorization (EUA). This EUA will remain in effect (meaning this test can be used) for the duration of the COVID-19 declaration under Section 564(b)(1) of the Act, 21 U.S.C. section 360bbb-3(b)(1), unless the authorization is terminated or revoked.  Performed at [facility], [address] [place], [place] [address]   Surgical pcr screen     Status: None   Collection Time: 11/27/20  2:28 PM   Specimen: Nasal Mucosa; Nasal Swab  Result Value Ref Range Status   MRSA, PCR NEGATIVE NEGATIVE Final   Staphylococcus aureus NEGATIVE NEGATIVE Final    Comment: (NOTE) The Xpert SA Assay (FDA approved for NASAL specimens in patients 14 years of age and older), is one component of a comprehensive surveillance program. It is not intended to diagnose infection nor to guide or monitor treatment. Performed at [facility], [address] [place], [place] [address]       Radiology Studies: DG Thoracic Spine 2 View  Result Date: 11/27/2020 CLINICAL DATA:  ORIF spine fracture EXAM: THORACIC SPINE 2 VIEWS; DG C-ARM 1-60 MIN COMPARISON:  MRI 11/22/2020, CT 10/26/2020 FINDINGS: Four low resolution intraoperative spot views of the thoracic  spine. Total fluoroscopy time was 5 minutes. Images demonstrate posterior spinal rods and fixating screws at the thoracolumbar spine. Ankylosis of the spine with fracture through syndesmophyte at T11-T12 and slightly widened disc space as demonstrated on prior CT. IMPRESSION: Intraoperative fluoroscopic assistance provided during surgical fixation of thoracic spine fracture Electronically Signed   By: Donavan Foil M.D.   On: 11/27/2020 22:06   DG THORACOLUMABAR SPINE  Result Date: 11/28/2020 CLINICAL DATA:  Spinal fusion EXAM: THORACOLUMBAR SPINE 1V COMPARISON:  FX spine radiograph 11/27/2020 FINDINGS: Chronic findings of ankylosing spondylitis. New T9-L2 posterior fusion fixating a T11 fracture. Intact hardware without evidence of loosening. IMPRESSION: Ankylosing spondylitis. Postsurgical changes of T9-L2 posterior fusion for a T11 fracture. No evidence of immediate hardware complication. Electronically Signed   By: Maurine Simmering M.D.   On: 11/28/2020 11:44   DG C-Arm 1-60 Min  Result Date: 11/27/2020 CLINICAL DATA:  ORIF spine fracture EXAM: THORACIC SPINE 2 VIEWS; DG C-ARM 1-60 MIN COMPARISON:  MRI 11/22/2020, CT 10/26/2020 FINDINGS: Four low resolution intraoperative spot views of the thoracic spine. Total fluoroscopy time  was 5 minutes. Images demonstrate posterior spinal rods and fixating screws at the thoracolumbar spine. Ankylosis of the spine with fracture through syndesmophyte at T11-T12 and slightly widened disc space as demonstrated on prior CT. IMPRESSION: Intraoperative fluoroscopic assistance provided during surgical fixation of thoracic spine fracture Electronically Signed   By: Donavan Foil M.D.   On: 11/27/2020 22:06   DG C-Arm 1-60 Min-No Report  Result Date: 11/27/2020 Fluoroscopy was utilized by the requesting physician.  No radiographic interpretation.   DG C-Arm 1-60 Min-No Report  Result Date: 11/27/2020 Fluoroscopy was utilized by the requesting physician.  No  radiographic interpretation.    Scheduled Meds:  Chlorhexidine Gluconate Cloth  6 each Topical Daily   dorzolamidel-timolol  1 drop Right Eye BID   latanoprost  1 drop Both Eyes QHS   loratadine  10 mg Oral Daily   losartan  50 mg Oral BID   metoprolol succinate  50 mg Oral Daily   mometasone-formoterol  2 puff Inhalation BID   polyethylene glycol  17 g Oral Daily   rosuvastatin  10 mg Oral QHS   senna-docusate  2 tablet Oral BID   tamsulosin  0.8 mg Oral QPC supper   Continuous Infusions:   LOS: 6 days   Time spent: 33 minutes. More than 50% of the time was spent in counseling/coordination of care  Lorella Nimrod, MD [facility]  If 7PM-7AM, please contact night-coverage [email or web address]  11/28/2020, 2:53 PM   This record has been created using Systems analyst. Errors have been sought and corrected,but may not always be located. Such creation errors do not reflect on the standard of care.

## 2020-11-28 NOTE — Progress Notes (Signed)
Progress Note   Date: 11/28/2020  History of Present Illness:  Chris Kent is a 85 y.o with a history of BPH, HLD, HTN, GERD, CAD, and CVA on Plavix presenting with 1 month of mid back pain. He had a fall while weed eating about 1 month ago resulting in a thoracic fracture. He was subsequently placed at a SNF due to the need for additional assistance as he lives alone.  He returned to his PCP yesterday for hospital follow up and continued back pain and was subsequently sent for updated CT scan showing T11 fracture with 3 column involvement. He presented to the ER for further evaluation and treatment. S/p T9-L2 fixation on 11/27/20  POD#1: NAEO. Pt reports improved back pain post-operative. Has not ambulated yet.   Vital Signs: Temp:  [97.6 F (36.4 C)-98.4 F (36.9 C)] 98.4 F (36.9 C) (10/25 0458) Pulse Rate:  [67-85] 85 (10/25 0458) Resp:  [11-18] 16 (10/25 0458) BP: (123-146)/(62-82) 135/78 (10/25 0458) SpO2:  [86 %-99 %] 98 % (10/25 0458) Temp (24hrs), Avg:97.9 F (36.6 C), Min:97.6 F (36.4 C), Max:98.4 F (36.9 C)  Weight: 102.1 kg   Problem List Patient Active Problem List   Diagnosis Date Noted   Unstable burst fracture of t11-T12 vertebra, subsequent encounter for fracture with delayed healing    Fracture, thoracic vertebra ([place]) 11/22/2020   History of stroke    Asthma    Fall 10/26/2020   Erectile dysfunction due to arterial insufficiency 06/29/2019   Nephrolithiasis 06/25/2018   Anxiety 11/21/2017   Asthma without status asthmaticus 11/21/2017   Esophagitis 11/21/2017   Glaucoma 11/21/2017   History of colonic polyps 11/21/2017   Hyperglycemia 11/21/2017   Hypertension 11/21/2017   Osteoarthritis 11/21/2017   Thyroid nodule 11/21/2017   Transient global amnesia 11/21/2017   Status post total bilateral knee replacement 01/12/2017   Essential hypertension, benign 03/05/2016   Hyperlipidemia 03/05/2016   Carotid stenosis 03/05/2016   Hypocitraturia  07/16/2015   Thrombocytopenia (HCC) 03/03/2014   Obstructive sleep apnea hypopnea, moderate 06/14/2013   Pulmonary nodules/lesions, multiple 06/14/2013   Renal cyst, acquired 04/06/2013   Enlarged prostate with lower urinary tract symptoms (LUTS) 03/18/2012   Erectile dysfunction 03/18/2012   Increased frequency of urination 03/18/2012   Nocturia 03/18/2012    Medications: Scheduled Meds:  Chlorhexidine Gluconate Cloth  6 each Topical Daily   dorzolamidel-timolol  1 drop Right Eye BID   fentaNYL       latanoprost  1 drop Both Eyes QHS   loratadine  10 mg Oral Daily   losartan  50 mg Oral BID   metoprolol succinate  50 mg Oral Daily   mometasone-formoterol  2 puff Inhalation BID   polyethylene glycol  17 g Oral Daily   rosuvastatin  10 mg Oral QHS   senna-docusate  2 tablet Oral BID   tamsulosin  0.8 mg Oral QPC supper   Continuous Infusions: PRN Meds:.acetaminophen, albuterol, bisacodyl, dextromethorphan-guaiFENesin, fentaNYL (SUBLIMAZE) injection, hydrALAZINE, ipratropium-albuterol, methocarbamol, morphine injection, ondansetron (ZOFRAN) IV, oxyCODONE-acetaminophen, oxyCODONE-acetaminophen, zolpidem  Labs:  Lab Results  Component Value Date   WBC 9.3 11/27/2020   WBC 8.4 11/23/2020   HCT 44.5 11/27/2020   HCT 44.0 11/23/2020   HCT 50.1 09/19/2012   HCT 50.0 03/23/2011   PLT 106 (L) 11/27/2020   PLT 125 (L) 11/23/2020   PLT 154 09/19/2012   PLT 132 (L) 03/23/2011    Lab Results  Component Value Date   INR 1.1 11/22/2020   APTT 25 11/22/2020  Lab Results  Component Value Date   NA 136 11/27/2020   NA 143 11/22/2020   NA 141 09/19/2012   NA 150 (H) 03/23/2011   K 4.0 11/27/2020   K 4.1 11/22/2020   K 4.2 09/19/2012   K 4.4 03/23/2011   BUN 25 (H) 11/27/2020   BUN 21 11/22/2020   BUN 17 09/19/2012   BUN 18 03/23/2011   No results found for: MG  Exam: General: Awake, in bed, in TLSO  Neurologic exam:  Mental status: alertness: alert,  affect:  normal Speech: fluent and clear Motor:strength symmetric 5/5 in bilateral lower extremities Sensory: intact to light touch in bilateral lower extremities Gait: untested  HV output: 200 since surgery.  Incision with clean post-op bandage in place  Imaging: CT Thoracic Spine: Background pattern of ankylosis throughout the region consistent with the diagnosis of ankylosing spondylitis. Fracture at T11 affecting the posterior elements and vertebral body with anterolisthesis of 2 mm. This would be an unstable or potentially unstable fracture.   Assessment/Plan: Mr Spurgeon is here with 3 column fracture at T11/12 in setting of ankylosis and need stabilization. S/p T9-L2 PSF 11/27/20  - thoracolumbar xrays pending - continue HV  - Pt to continue TLSO when ambulating. Please remove when patient is in bed.  - Ok for PT/OT from neurosurgical standpoint  - Ok to resume Plavix if indicated on POD#14 (12/12/20) - Ok for DVT ppx from neurosurgical standpoint.   Loleta Dicker, PA-C Neurosurgery

## 2020-11-28 NOTE — Evaluation (Signed)
Occupational Therapy Evaluation Patient Details Name: Chris Kent MRN: [id number] DOB: 1935-10-17 Today's Date: 11/28/2020   History of Present Illness Pt is a 85 y.o. 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  Recent admission to SNF following a fall 10/26/20. PMH includes COPD (1L O2 at night), PVD, HTN.   Clinical Impression   Pt seen for OT evaluation this date, POD#1 from above back surgery on 10/24. Prior to hospital admission, pt was independent with mobility, ADL, and IADL. Endorses fall in September (loose board as he was walking down the ramp of his shed). Per chart and pt, family able to provide initial 24/7 assist/support as needed for pt. Pt received in bed, reports just having got back from recliner with staff and politely declining OOB again this afternoon 2/2 fatigue and 2/10 back pain. Pt currently requires MIN-MOD A for LB ADL tasks 2/2 back pain and necessary precautions during recovery.    Pt denies recall of previous instruction from recent PT session in precautions and TLSO. Pt instructed in back precautions and how to maintain during ADL and mobility, home/routines modifications, positioning for sleep to optimize spinal alignment, and AE/DME for ADL. Handout provided to support recall and carryover. At end of session, pt able to recall 100% of back precautions but still demo's difficulty with verbalizing how to implement/maintain during ADL. Pt will benefit from continued skilled OT services to maximize return to PLOF and minimize risk of falls. Recommend HHOT services and initial supervision/assist from family to support pt in setting up new routines.    Recommendations for follow up therapy are one component of a multi-disciplinary discharge planning process, led by the attending physician.  Recommendations may be updated based on patient status, additional  functional criteria and insurance authorization.   Follow Up Recommendations  Home health OT    Assistance Recommended at Discharge Intermittent Supervision/Assistance (initial 24/7 supervision for the first couple days to maximize safety/adherence to precautions, and optimal home set up.)  Functional Status Assessment  Patient has had a recent decline in their functional status and demonstrates the ability to make significant improvements in function in a reasonable and predictable amount of time.  Equipment Recommendations  BSC    Recommendations for Other Services       Precautions / Restrictions Precautions Precautions: Fall;Back Precaution Booklet Issued: Yes (comment) Precaution Comments: unable to recall at start of session, able to recall 100% no bending/lifting/twisting/arching at end of session with increased recall time. Difficulty with verbalizing how to maintain with ADL however. Required Braces or Orthoses: Spinal Brace Spinal Brace: Thoracolumbosacral orthotic Restrictions Weight Bearing Restrictions: No Other Position/Activity Restrictions: TLSO for OOB.      Mobility Bed Mobility        General bed mobility comments: Pt declined, just back to bed    Transfers       Balance                            ADL either performed or assessed with clinical judgement   ADL Overall ADL's : Needs assistance/impaired                                       General ADL Comments: Pt currently requires MIN-MOD A for  LB ADL 2/2 back precautions, MAX VC for sequencing to maintain back precautions     Vision         Perception     Praxis      Pertinent Vitals/Pain Pain Assessment: 0-10 Pain Score: 2  Pain Location: Back Pain Descriptors / Indicators: Aching;Sore Pain Intervention(s): Limited activity within patient's tolerance;Monitored during session;Premedicated before session;Repositioned     Hand Dominance Right    Extremity/Trunk Assessment Upper Extremity Assessment Upper Extremity Assessment: Generalized weakness   Lower Extremity Assessment Lower Extremity Assessment: Generalized weakness   Cervical / Trunk Assessment Cervical / Trunk Assessment: Kyphotic   Communication Communication Communication: No difficulties   Cognition Arousal/Alertness: Awake/alert Behavior During Therapy: Flat affect;WFL for tasks assessed/performed Overall Cognitive Status: Within Functional Limits for tasks assessed                                 General Comments: decreased recall of precautions, but otherwise A&Ox4, also [facility]     General Comments       Exercises Other Exercises Other Exercises: Pt denies recall of previous instruction from recent PT session in precautions and TLSO. Pt instructed in back precautions and how to maintain during ADL and mobility, home/routines modifications, positioning for sleep to optimize spinal alignment, and AE/DME for ADL. Handout provided to support recall and carryover. At end of session, pt able to recall 100% of back precautions but still demo's difficulty with verbalizing how to implement/maintain during ADL.   Shoulder Instructions      Home Living Family/patient expects to be discharged to:: Private residence Living Arrangements: Alone Available Help at Discharge: Family;Available 24 hours/day Type of Home: House Home Access: Stairs to enter [employer] of Steps: 1 Entrance Stairs-Rails: None Home Layout: One level     Bathroom Shower/Tub: Teacher, early years/pre: Handicapped height     Home Equipment: Conservation officer, nature (2 wheels);Tub bench;Hand held shower head;Adaptive equipment Adaptive Equipment: Reacher;Sock aid;Long-handled shoe horn        Prior Functioning/Environment Prior Level of Function : Independent/Modified Independent             Mobility Comments: Prior to fall outside 9/22 (fell on loose board  walking down ramp of shed) pt was independent for all activities and mobility including driving ADLs Comments: wife passed away in 03/07/20        OT Problem List: Decreased strength;Pain;Decreased range of motion;Decreased safety awareness;Impaired balance (sitting and/or standing);Decreased knowledge of use of DME or AE;Decreased knowledge of precautions      OT Treatment/Interventions: Self-care/ADL training;Therapeutic exercise;Therapeutic activities;DME and/or AE instruction;Patient/family education;Balance training    OT Goals(Current goals can be found in the care plan section) Acute Rehab OT Goals Patient Stated Goal: get better and go home OT Goal Formulation: With patient Time For Goal Achievement: 12/12/20 Potential to Achieve Goals: Good ADL Goals Pt Will Perform Lower Body Bathing: with modified independence;with adaptive equipment;sitting/lateral leans (maintaining back precautions) Pt Will Perform Lower Body Dressing: with modified independence;with adaptive equipment;sit to/from stand (maintaining back precautions) Pt Will Transfer to Toilet: with modified independence;ambulating;bedside commode (BSC over toilet, maintaining back precautions) Pt Will Perform Tub/Shower Transfer: Tub transfer;ambulating;tub bench;rolling walker;with supervision;with caregiver independent in assisting (maintaining back precautions) Additional ADL Goal #1: Pt will verbalize 100% of back precautions and how to maintain during LB ADL. Additional ADL Goal #2: Pt will perform daily morning ADL routine with PRN VC for back  precautions/sequencing, mod indep.  OT Frequency: Min 2X/week   Barriers to D/C:            Co-evaluation              AM-PAC OT "6 Clicks" Daily Activity     Outcome Measure Help from another person eating meals?: None Help from another person taking care of personal grooming?: A Little Help from another person toileting, which includes using toliet, bedpan, or  urinal?: A Little Help from another person bathing (including washing, rinsing, drying)?: A Little Help from another person to put on and taking off regular upper body clothing?: A Little Help from another person to put on and taking off regular lower body clothing?: A Little 6 Click Score: 19   End of Session    Activity Tolerance: Patient tolerated treatment well Patient left: in bed;with call bell/phone within reach;with bed alarm set  OT Visit Diagnosis: Other abnormalities of gait and mobility (R26.89);Muscle weakness (generalized) (M62.81);History of falling (Z91.81);Pain Pain - part of body:  (back)                Time: 7897-8478 OT Time Calculation (min): 24 min Charges:  OT General Charges $OT Visit: 1 Visit OT Evaluation $OT Eval Moderate Complexity: 1 Mod OT Treatments $Self Care/Home Management : 8-22 mins  Ardeth Perfect., MPH, MS, OTR/L ascom [phone] 11/28/20, 3:57 PM

## 2020-11-29 DIAGNOSIS — J449 Chronic obstructive pulmonary disease, unspecified: Secondary | ICD-10-CM | POA: Diagnosis not present

## 2020-11-29 DIAGNOSIS — E559 Vitamin D deficiency, unspecified: Secondary | ICD-10-CM | POA: Diagnosis not present

## 2020-11-29 DIAGNOSIS — I2510 Atherosclerotic heart disease of native coronary artery without angina pectoris: Secondary | ICD-10-CM | POA: Diagnosis not present

## 2020-11-29 DIAGNOSIS — R296 Repeated falls: Secondary | ICD-10-CM | POA: Diagnosis not present

## 2020-11-29 DIAGNOSIS — F419 Anxiety disorder, unspecified: Secondary | ICD-10-CM | POA: Diagnosis not present

## 2020-11-29 DIAGNOSIS — I10 Essential (primary) hypertension: Secondary | ICD-10-CM | POA: Diagnosis not present

## 2020-11-29 DIAGNOSIS — H409 Unspecified glaucoma: Secondary | ICD-10-CM | POA: Diagnosis not present

## 2020-11-29 LAB — URINE CULTURE: Culture: >=100000 — AB

## 2020-11-29 LAB — CBC
HCT: 38.4 % — ABNORMAL LOW (ref 39.0–52.0)
Hemoglobin: 12.8 g/dL — ABNORMAL LOW (ref 13.0–17.0)
MCH: 30.8 pg (ref 26.0–34.0)
MCHC: 33.3 g/dL (ref 30.0–36.0)
MCV: 92.5 fL (ref 80.0–100.0)
Platelets: 127 10*3/uL — ABNORMAL LOW (ref 150–400)
RBC: 4.15 MIL/uL — ABNORMAL LOW (ref 4.22–5.81)
RDW: 14.1 % (ref 11.5–15.5)
WBC: 14.9 10*3/uL — ABNORMAL HIGH (ref 4.0–10.5)
nRBC: 0 % (ref 0.0–0.2)

## 2020-11-29 LAB — GLUCOSE, CAPILLARY: Glucose-Capillary: 128 mg/dL — ABNORMAL HIGH (ref 70–99)

## 2020-11-29 LAB — BASIC METABOLIC PANEL
Anion gap: 2 — ABNORMAL LOW (ref 5–15)
BUN: 34 mg/dL — ABNORMAL HIGH (ref 8–23)
CO2: 29 mmol/L (ref 22–32)
Calcium: 8.2 mg/dL — ABNORMAL LOW (ref 8.9–10.3)
Chloride: 103 mmol/L (ref 98–111)
Creatinine, Ser: 1.09 mg/dL (ref 0.61–1.24)
GFR, Estimated: >60 mL/min (ref >60)
Glucose, Bld: 137 mg/dL — ABNORMAL HIGH (ref 70–99)
Potassium: 4.2 mmol/L (ref 3.5–5.1)
Sodium: 134 mmol/L — ABNORMAL LOW (ref 135–145)

## 2020-11-29 MED ORDER — SODIUM CHLORIDE 0.9 % IV SOLN
2.0000 g | Freq: Four times a day (QID) | INTRAVENOUS | Status: DC
  Administered 2020-11-29 – 2020-11-30 (×5): 2 g via INTRAVENOUS
  Filled 2020-11-29 (×9): qty 2000

## 2020-11-29 MED ORDER — ENOXAPARIN SODIUM 40 MG/0.4ML IJ SOSY
40.0000 mg | PREFILLED_SYRINGE | INTRAMUSCULAR | Status: DC
  Administered 2020-11-29: 40 mg via SUBCUTANEOUS
  Filled 2020-11-29: qty 0.4

## 2020-11-29 NOTE — Progress Notes (Signed)
Physical Therapy Treatment Patient Details Name: Chris Kent MRN: [id number] DOB: 1935-09-11 Today's Date: 11/29/2020   History of Present Illness Pt is a 85 y.o. 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  Recent admission to SNF following a fall 10/26/20. PMH includes COPD (1L O2 at night), PVD, HTN.    PT Comments    Pt supine, notes increased pain 6/10 at rest, recently provided pain meds. However, pt agreeable to therapy and desires to improve in overall function. Pt requires the most physical assistance w/ bed mobility for bringing trunk upright, and demonstrates decreased recall of donning/doffing brace and appropriate spinal precautions. Pt is progressing with ambulation, completing full loop around nursing station without rest breaks, RW, CGA. Overall pt remains motivated for improvement and returning home. HHPT remains primary recommendation at discharge. Skilled PT intervention is indicated to address deficits in function, mobility, and to return to PLOF as able.     Recommendations for follow up therapy are one component of a multi-disciplinary discharge planning process, led by the attending physician.  Recommendations may be updated based on patient status, additional functional criteria and insurance authorization.  Follow Up Recommendations  Home health PT     Assistance Recommended at Discharge Intermittent Supervision/Assistance  Equipment Recommendations  3in1 (PT)    Recommendations for Other Services       Precautions / Restrictions Precautions Precautions: Fall;Back Required Braces or Orthoses: Spinal Brace Spinal Brace: Thoracolumbosacral orthotic Restrictions Weight Bearing Restrictions: No Other Position/Activity Restrictions: TLSO for OOB.     Mobility  Bed Mobility Overal bed mobility: Needs Assistance Bed Mobility: Rolling;Sidelying  to Sit Rolling: Min guard Sidelying to sit: Min assist;HOB elevated       General bed mobility comments: unable to recite rolling pattern, cues for reminders    Transfers Overall transfer level: Needs assistance Equipment used: Rolling walker (2 wheels) Transfers: Sit to/from Stand Sit to Stand: Min guard                Ambulation/Gait Ambulation/Gait assistance: Supervision Gait Distance (Feet): 180 Feet Assistive device: Rolling walker (2 wheels) Gait Pattern/deviations: Decreased step length - right;Decreased step length - left;Decreased dorsiflexion - right;Decreased dorsiflexion - left;Decreased stride length;Trunk flexed     General Gait Details: No rest breaks, improved cadence; amb decreases pain levels   Stairs             Wheelchair Mobility    Modified Rankin (Stroke Patients Only)       Balance Overall balance assessment: Needs assistance Sitting-balance support: Single extremity supported;Feet supported Sitting balance-Leahy Scale: Good     Standing balance support: Bilateral upper extremity supported;During functional activity Standing balance-Leahy Scale: Poor                              Cognition Arousal/Alertness: Awake/alert Behavior During Therapy: WFL for tasks assessed/performed Overall Cognitive Status: Within Functional Limits for tasks assessed                                          Exercises Other Exercises Other Exercises: Pt edu: pt unable to recall bracing technique w/ OOB mobility. Teachback instructions for doffing brace.    General Comments General comments (skin integrity, edema, etc.):  On 2L in bed, RA with mobility SpO2 > 90%      Pertinent Vitals/Pain Pain Assessment: 0-10 Pain Score: 6  Pain Location: Back Pain Intervention(s): Limited activity within patient's tolerance;Monitored during session;Repositioned;Premedicated before session    Home Living                           Prior Function            PT Goals (current goals can now be found in the care plan section) Progress towards PT goals: Progressing toward goals    Frequency    7X/week      PT Plan Current plan remains appropriate    Co-evaluation              AM-PAC PT "6 Clicks" Mobility   Outcome Measure  Help needed turning from your back to your side while in a flat bed without using bedrails?: A Little Help needed moving from lying on your back to sitting on the side of a flat bed without using bedrails?: A Little Help needed moving to and from a bed to a chair (including a wheelchair)?: A Little Help needed standing up from a chair using your arms (e.g., wheelchair or bedside chair)?: A Little Help needed to walk in hospital room?: A Little Help needed climbing 3-5 steps with a railing? : A Little 6 Click Score: 18    End of Session Equipment Utilized During Treatment: Gait belt;Back brace Activity Tolerance: Patient tolerated treatment well Patient left: in chair;with call bell/phone within reach;with chair alarm set Nurse Communication: Mobility status PT Visit Diagnosis: Muscle weakness (generalized) (M62.81);Pain;Difficulty in walking, not elsewhere classified (R26.2)     Time: 4268-3419 PT Time Calculation (min) (ACUTE ONLY): 25 min  Charges:                        [employer], SPT

## 2020-11-29 NOTE — Plan of Care (Signed)

## 2020-11-29 NOTE — Progress Notes (Signed)
PROGRESS NOTE    Chris Kent  JQB:[id number] DOB: 11/24/1935 DOA: 11/22/2020 PCP: Idelle Crouch, MD  [id number]   Assessment & Plan:   Principal Problem:   Fracture, thoracic vertebra ([place]) Active Problems:   Hyperlipidemia   Enlarged prostate with lower urinary tract symptoms (LUTS)   Hypertension   History of stroke   Asthma   Unstable burst fracture of t11-T12 vertebra, subsequent encounter for fracture with delayed healing   Chris Kent is a 85 y.o. male with medical history significant of hypertension, hyperlipidemia, asthma on 1 L oxygen, stroke, GERD, anxiety, OSA not on CPAP, PVD, BPH, who presents with back pain.  Pt had fall resulting in possible acute fracture of the anterior bridging osteophytes of T11 and T12,. Pt was hospitalized from 9/20 to 9/28 and discharged to nursing home. He continues to have back pain after he went home. He returned to his PCP yesterday for hospital follow up and had CT scan showing unstable T11 fracture.  Rest of the imaging was without any other significant abnormality. Neurosurgery was consulted and they will take him to the OR for fusion after Plavix washout.  Last Plavix dose was on 11/21/2020.  Admitted for pain control.  Fracture, thoracic vertebra [facility]):   unstable three column injury S/p POSTERIOR THORACIC FUSION T9-L2 on 11/27/20 --TLSO only with ambulation --resume plavix on 12/12/20 -Continue with pain management --PT/OT   UTI, not POA --pt complained of dysuria while Foley cath was in.  Foley was removed.  UA looks infected, urine culture pos for Enterococcus faecalis --start IV ampicillin today   Enlarged prostate with lower urinary tract symptoms (LUTS) -Continue Flomax --Bladder scan to ensure no retention   HLD: -Continue Crestor   Hypertension -cont home losartan and Toprol   History of stroke -cont statin -Keep holding Plavix, can restart on 12/12/2020 per neurosurgery recommendations    Asthma: Stable --cont daily bronchodilators   DVT prophylaxis: Lovenox SQ Code Status: Full code  Family Communication:  Level of care: Med-Surg Dispo:   The patient is from: home Anticipated d/c is to: home Anticipated d/c date is: 1-2 days Patient currently is not medically ready to d/c due to: need neurosurgery to clear for discharge   Subjective and Interval History:  Pt complained of achy back pain, though improved from prior to surgery.    Normal oral intake, BM, though concern for urinary retention.   Objective: Vitals:   11/29/20 0525 11/29/20 0735 11/29/20 1145 11/29/20 1510  BP: (!) 114/57 126/61 (!) 85/48 133/67  Pulse: 79 77 67 81  Resp: 16 14 16 16   Temp: 98.5 F (36.9 C) 97.7 F (36.5 C) 98.2 F (36.8 C) 98 F (36.7 C)  TempSrc: Oral     SpO2: 98% 100% 98% 93%  Weight:      Height:        Intake/Output Summary (Last 24 hours) at 11/29/2020 1836 Last data filed at 11/29/2020 1413 Gross per 24 hour  Intake 240 ml  Output 530 ml  Net -290 ml   Filed Weights   11/21/20 1725  Weight: 102.1 kg    Examination:   Constitutional: NAD, AAOx3, brace on HEENT: conjunctivae and lids normal, EOMI CV: No cyanosis.   RESP: normal respiratory effort, on RA Extremities: mild edema in BLE SKIN: warm, dry Neuro: II - XII grossly intact.   Psych: depressed mood and affect.  Appropriate judgement and reason   Data Reviewed: I have personally reviewed following labs and imaging  studies  CBC: Recent Labs  Lab 11/23/20 0647 11/27/20 1056 11/29/20 0136  WBC 8.4 9.3 14.9*  HGB 14.4 15.0 12.8*  HCT 44.0 44.5 38.4*  MCV 93.4 94.1 92.5  PLT 125* 106* 270*   Basic Metabolic Panel: Recent Labs  Lab 11/27/20 1056 11/29/20 0136  NA 136 134*  K 4.0 4.2  CL 101 103  CO2 27 29  GLUCOSE 106* 137*  BUN 25* 34*  CREATININE 0.86 1.09  CALCIUM 8.8* 8.2*   GFR: Estimated Creatinine Clearance: 60.5 mL/min (by C-G formula based on SCr of 1.09  mg/dL). Liver Function Tests: No results for input(s): AST, ALT, ALKPHOS, BILITOT, PROT, ALBUMIN in the last 168 hours. No results for input(s): LIPASE, AMYLASE in the last 168 hours. No results for input(s): AMMONIA in the last 168 hours. Coagulation Profile: No results for input(s): INR, PROTIME in the last 168 hours. Cardiac Enzymes: No results for input(s): CKTOTAL, CKMB, CKMBINDEX, TROPONINI in the last 168 hours. BNP (last 3 results) No results for input(s): PROBNP in the last 8760 hours. HbA1C: No results for input(s): HGBA1C in the last 72 hours. CBG: Recent Labs  Lab 11/27/20 0824 11/27/20 1536 11/28/20 0853 11/28/20 1614 11/29/20 0737  GLUCAP 107* 98 178* 140* 128*   Lipid Profile: No results for input(s): CHOL, HDL, LDLCALC, TRIG, CHOLHDL, LDLDIRECT in the last 72 hours. Thyroid Function Tests: No results for input(s): TSH, T4TOTAL, FREET4, T3FREE, THYROIDAB in the last 72 hours. Anemia Panel: No results for input(s): VITAMINB12, FOLATE, FERRITIN, TIBC, IRON, RETICCTPCT in the last 72 hours. Sepsis Labs: No results for input(s): PROCALCITON, LATICACIDVEN in the last 168 hours.  Recent Results (from the past 240 hour(s))  Resp Panel by RT-PCR (Flu A&B, Covid) Nasopharyngeal Swab     Status: None   Collection Time: 11/22/20  6:30 AM   Specimen: Nasopharyngeal Swab; Nasopharyngeal(NP) swabs in vial transport medium  Result Value Ref Range Status   SARS Coronavirus 2 by RT PCR NEGATIVE NEGATIVE Final    Comment: (NOTE) SARS-CoV-2 target nucleic acids are NOT DETECTED.  The SARS-CoV-2 RNA is generally detectable in upper respiratory specimens during the acute phase of infection. The lowest concentration of SARS-CoV-2 viral copies this assay can detect is 138 copies/mL. A negative result does not preclude SARS-Cov-2 infection and should not be used as the sole basis for treatment or other patient management decisions. A negative result may occur with  improper  specimen collection/handling, submission of specimen other than nasopharyngeal swab, presence of viral mutation(s) within the areas targeted by this assay, and inadequate number of viral copies(<138 copies/mL). A negative result must be combined with clinical observations, patient history, and epidemiological information. The expected result is Negative.  Fact Sheet for Patients:  EntrepreneurPulse.com.au  Fact Sheet for Healthcare Providers:  IncredibleEmployment.be  This test is no t yet approved or cleared by the [place] FDA and  has been authorized for detection and/or diagnosis of SARS-CoV-2 by FDA under an Emergency Use Authorization (EUA). This EUA will remain  in effect (meaning this test can be used) for the duration of the COVID-19 declaration under Section 564(b)(1) of the Act, 21 U.S.C.section 360bbb-3(b)(1), unless the authorization is terminated  or revoked sooner.       Influenza A by PCR NEGATIVE NEGATIVE Final   Influenza B by PCR NEGATIVE NEGATIVE Final    Comment: (NOTE) The Xpert Xpress SARS-CoV-2/FLU/RSV plus assay is intended as an aid in the diagnosis of influenza from Nasopharyngeal swab specimens and should not  be used as a sole basis for treatment. Nasal washings and aspirates are unacceptable for Xpert Xpress SARS-CoV-2/FLU/RSV testing.  Fact Sheet for Patients: EntrepreneurPulse.com.au  Fact Sheet for Healthcare Providers: IncredibleEmployment.be  This test is not yet approved or cleared by the [place] FDA and has been authorized for detection and/or diagnosis of SARS-CoV-2 by FDA under an Emergency Use Authorization (EUA). This EUA will remain in effect (meaning this test can be used) for the duration of the COVID-19 declaration under Section 564(b)(1) of the Act, 21 U.S.C. section 360bbb-3(b)(1), unless the authorization is terminated or revoked.  Performed at  [facility], [address], [place], [place] [address]   Urine Culture     Status: Abnormal   Collection Time: 11/27/20  2:20 PM   Specimen: Urine, Catheterized  Result Value Ref Range Status   Specimen Description   Final    URINE, CATHETERIZED Performed at [facility], [address] [place], [place] [address]    Special Requests   Final    NONE Performed at [facility], [address] [place], [place] [address]    Culture >=100,000 COLONIES/mL ENTEROCOCCUS FAECALIS (A)  Final   Report Status 11/29/2020 FINAL  Final   Organism ID, Bacteria ENTEROCOCCUS FAECALIS (A)  Final      Susceptibility   Enterococcus faecalis - MIC*    AMPICILLIN <=2 SENSITIVE Sensitive     NITROFURANTOIN <=16 SENSITIVE Sensitive     VANCOMYCIN 2 SENSITIVE Sensitive     * >=100,000 COLONIES/mL ENTEROCOCCUS FAECALIS  Surgical pcr screen     Status: None   Collection Time: 11/27/20  2:28 PM   Specimen: Nasal Mucosa; Nasal Swab  Result Value Ref Range Status   MRSA, PCR NEGATIVE NEGATIVE Final   Staphylococcus aureus NEGATIVE NEGATIVE Final    Comment: (NOTE) The Xpert SA Assay (FDA approved for NASAL specimens in patients 11 years of age and older), is one component of a comprehensive surveillance program. It is not intended to diagnose infection nor to guide or monitor treatment. Performed at [facility], [address], [place], [place] [address]       Radiology Studies: DG Thoracic Spine 2 View  Result Date: 11/27/2020 CLINICAL DATA:  ORIF spine fracture EXAM: THORACIC SPINE 2 VIEWS; DG C-ARM 1-60 MIN COMPARISON:  MRI 11/22/2020, CT 10/26/2020 FINDINGS: Four low resolution intraoperative spot views of the thoracic spine. Total fluoroscopy time was 5 minutes. Images demonstrate posterior spinal rods and fixating screws at the thoracolumbar spine. Ankylosis of the spine with fracture through syndesmophyte at T11-T12 and slightly widened disc space as  demonstrated on prior CT. IMPRESSION: Intraoperative fluoroscopic assistance provided during surgical fixation of thoracic spine fracture Electronically Signed   By: Donavan Foil M.D.   On: 11/27/2020 22:06   DG THORACOLUMABAR SPINE  Result Date: 11/28/2020 CLINICAL DATA:  Spinal fusion EXAM: THORACOLUMBAR SPINE 1V COMPARISON:  FX spine radiograph 11/27/2020 FINDINGS: Chronic findings of ankylosing spondylitis. New T9-L2 posterior fusion fixating a T11 fracture. Intact hardware without evidence of loosening. IMPRESSION: Ankylosing spondylitis. Postsurgical changes of T9-L2 posterior fusion for a T11 fracture. No evidence of immediate hardware complication. Electronically Signed   By: Maurine Simmering M.D.   On: 11/28/2020 11:44   DG C-Arm 1-60 Min  Result Date: 11/27/2020 CLINICAL DATA:  ORIF spine fracture EXAM: THORACIC SPINE 2 VIEWS; DG C-ARM 1-60 MIN COMPARISON:  MRI 11/22/2020, CT 10/26/2020 FINDINGS: Four low resolution intraoperative spot views of the thoracic spine. Total fluoroscopy time  was 5 minutes. Images demonstrate posterior spinal rods and fixating screws at the thoracolumbar spine. Ankylosis of the spine with fracture through syndesmophyte at T11-T12 and slightly widened disc space as demonstrated on prior CT. IMPRESSION: Intraoperative fluoroscopic assistance provided during surgical fixation of thoracic spine fracture Electronically Signed   By: Donavan Foil M.D.   On: 11/27/2020 22:06   DG C-Arm 1-60 Min-No Report  Result Date: 11/27/2020 Fluoroscopy was utilized by the requesting physician.  No radiographic interpretation.   DG C-Arm 1-60 Min-No Report  Result Date: 11/27/2020 Fluoroscopy was utilized by the requesting physician.  No radiographic interpretation.     Scheduled Meds:  Chlorhexidine Gluconate Cloth  6 each Topical Daily   dorzolamidel-timolol  1 drop Right Eye BID   latanoprost  1 drop Both Eyes QHS   loratadine  10 mg Oral Daily   losartan  50 mg Oral BID    metoprolol succinate  50 mg Oral Daily   mometasone-formoterol  2 puff Inhalation BID   polyethylene glycol  17 g Oral Daily   rosuvastatin  10 mg Oral QHS   senna-docusate  2 tablet Oral BID   tamsulosin  0.8 mg Oral QPC supper   Continuous Infusions:  ampicillin (OMNIPEN) IV 2 g (11/29/20 1828)     LOS: 7 days     Enzo Bi, MD [facility] If 7PM-7AM, please contact night-coverage 11/29/2020, 6:36 PM

## 2020-11-29 NOTE — Progress Notes (Signed)
Progress Note   Date: 11/29/2020  History of Present Illness:  Chris Kent is a 85 y.o with a history of BPH, HLD, HTN, GERD, CAD, and CVA on Plavix presenting with 1 month of mid back pain. He had a fall while weed eating about 1 month ago resulting in a thoracic fracture. He was subsequently placed at a SNF due to the need for additional assistance as he lives alone.  He returned to his PCP yesterday for hospital follow up and continued back pain and was subsequently sent for updated CT scan showing T11 fracture with 3 column involvement. He presented to the ER for further evaluation and treatment. S/p T9-L2 fixation on 11/27/20  POD#2: increased pain yesterday with PT but still reporting improvement from pre-op.  POD#1: NAEO. Pt reports improved back pain post-operative. Has not ambulated yet.   Vital Signs: Temp:  [97.6 F (36.4 C)-98.5 F (36.9 C)] 98.5 F (36.9 C) (10/26 0525) Pulse Rate:  [73-82] 79 (10/26 0525) Resp:  [14-16] 16 (10/26 0525) BP: (97-143)/(47-70) 114/57 (10/26 0525) SpO2:  [93 %-100 %] 98 % (10/26 0525) Temp (24hrs), Avg:98.1 F (36.7 C), Min:97.6 F (36.4 C), Max:98.5 F (36.9 C)  Weight: 102.1 kg   Problem List Patient Active Problem List   Diagnosis Date Noted   Unstable burst fracture of t11-T12 vertebra, subsequent encounter for fracture with delayed healing    Fracture, thoracic vertebra ([place]) 11/22/2020   History of stroke    Asthma    Fall 10/26/2020   Erectile dysfunction due to arterial insufficiency 06/29/2019   Nephrolithiasis 06/25/2018   Anxiety 11/21/2017   Asthma without status asthmaticus 11/21/2017   Esophagitis 11/21/2017   Glaucoma 11/21/2017   History of colonic polyps 11/21/2017   Hyperglycemia 11/21/2017   Hypertension 11/21/2017   Osteoarthritis 11/21/2017   Thyroid nodule 11/21/2017   Transient global amnesia 11/21/2017   Status post total bilateral knee replacement 01/12/2017   Essential hypertension, benign  03/05/2016   Hyperlipidemia 03/05/2016   Carotid stenosis 03/05/2016   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   Thrombocytopenia ([place]) 03/03/2014   Obstructive sleep apnea hypopnea, moderate 06/14/2013   Pulmonary nodules/lesions, multiple 06/14/2013   Renal cyst, acquired 04/06/2013   Enlarged prostate with lower urinary tract symptoms (LUTS) 03/18/2012   Erectile dysfunction 03/18/2012   Increased frequency of urination 03/18/2012   Nocturia 03/18/2012    Medications: Scheduled Meds:  Chlorhexidine Gluconate Cloth  6 each Topical Daily   dorzolamidel-timolol  1 drop Right Eye BID   latanoprost  1 drop Both Eyes QHS   loratadine  10 mg Oral Daily   losartan  50 mg Oral BID   metoprolol succinate  50 mg Oral Daily   mometasone-formoterol  2 puff Inhalation BID   polyethylene glycol  17 g Oral Daily   rosuvastatin  10 mg Oral QHS   senna-docusate  2 tablet Oral BID   tamsulosin  0.8 mg Oral QPC supper   Continuous Infusions: PRN Meds:.acetaminophen, albuterol, bisacodyl, dextromethorphan-guaiFENesin, fentaNYL (SUBLIMAZE) injection, hydrALAZINE, ipratropium-albuterol, methocarbamol, morphine injection, ondansetron (ZOFRAN) IV, oxyCODONE-acetaminophen, oxyCODONE-acetaminophen, zolpidem  Labs:  Lab Results  Component Value Date   WBC 14.9 (H) 11/29/2020   WBC 9.3 11/27/2020   HCT 38.4 (L) 11/29/2020   HCT 44.5 11/27/2020   HCT 50.1 09/19/2012   HCT 50.0 03/23/2011   PLT 127 (L) 11/29/2020   PLT 106 (L) 11/27/2020   PLT 154 09/19/2012   PLT 132 (L) 03/23/2011    Lab Results  Component Value Date  INR 1.1 11/22/2020   APTT 25 11/22/2020    Lab Results  Component Value Date   NA 134 (L) 11/29/2020   NA 136 11/27/2020   NA 141 09/19/2012   NA 150 (H) 03/23/2011   K 4.2 11/29/2020   K 4.0 11/27/2020   K 4.2 09/19/2012   K 4.4 03/23/2011   BUN 34 (H) 11/29/2020   BUN 25 (H) 11/27/2020   BUN 17 09/19/2012   BUN 18 03/23/2011   No results found for: MG  Exam: General:  Awake, in bed, in TLSO  Neurologic exam:  Mental status: alertness: alert,  affect: normal Speech: fluent and clear Motor:strength symmetric 5/5 in bilateral lower extremities Sensory: intact to light touch in bilateral lower extremities Gait: untested  HV output: 10 overnight Incision with clean post-op bandage in place  Imaging: CT Thoracic Spine: Background pattern of ankylosis throughout the region consistent with the diagnosis of ankylosing spondylitis. Fracture at T11 affecting the posterior elements and vertebral body with anterolisthesis of 2 mm. This would be an unstable or potentially unstable fracture.   Assessment/Plan: Chris Kent is here with 3 column fracture at T11/12 in setting of ankylosis and need stabilization. S/p T9-L2 PSF 11/27/20  - thoracolumbar xrays show good hardware placement - Will remove HV today - Pt to continue TLSO when ambulating. Please remove when patient is in bed.  - discussed with the patient of moving from side to side in bed to relieve incisional pressure. - Ok for PT/OT from neurosurgical standpoint  - Ok to resume Plavix if indicated on POD#14 (12/12/20) - Ok for DVT ppx from neurosurgical standpoint.   Loleta Dicker, PA-C Neurosurgery

## 2020-11-29 NOTE — Progress Notes (Signed)
Occupational Therapy Treatment Patient Details Name: Chris Kent MRN: [id number] DOB: 10/09/1935 Today's Date: 11/29/2020   History of present illness Pt is a 85 y.o. 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  Recent admission to SNF following a fall 10/26/20. PMH includes COPD (1L O2 at night), PVD, HTN.   OT comments  Pt seen for OT tx this date to f/u re: safety with ADLs/ADL mobility given precautions and TLSO use. Pt requires cues to re-oriented to precautions (able to name 2/4 beginning of session, 4/4 end). Pt able to demo log roll with minimal cueing. OT ed re: donning/doffing TLSO, importance/rationale, and use of grabber for LB ADLs (Pt requires MIN/MOD A). Pt comes to EOB sitting with MIN A and gets back to bed end of session citing fatigue stating he'd been up 3-4 times already today. OT will continue to follow acutely.    Recommendations for follow up therapy are one component of a multi-disciplinary discharge planning process, led by the attending physician.  Recommendations may be updated based on patient status, additional functional criteria and insurance authorization.    Follow Up Recommendations  Home health OT    Assistance Recommended at Discharge Intermittent Supervision/Assistance  Equipment Recommendations  [facility]    Recommendations for Other Services      Precautions / Restrictions Precautions Precautions: Fall;Back Precaution Comments: recalls 2/4 back precautions before tx, 4/4 after Required Braces or Orthoses: Spinal Brace Spinal Brace: Thoracolumbosacral orthotic Restrictions Weight Bearing Restrictions: No Other Position/Activity Restrictions: TLSO for OOB.       Mobility Bed Mobility Overal bed mobility: Needs Assistance Bed Mobility: Sidelying to Sit;Sit to Sidelying Rolling: Min guard Sidelying to sit: Min assist     Sit to  sidelying: Min assist General bed mobility comments: increased assist for LEs back to bed    Transfers Overall transfer level: Needs assistance Equipment used: Rolling walker (2 wheels) Transfers: Sit to/from Stand Sit to Stand: Min guard           General transfer comment: deferred, pt reports getting OOB this date at least 4 times already     Balance Overall balance assessment: Needs assistance Sitting-balance support: Single extremity supported;Feet supported Sitting balance-Leahy Scale: Good     Standing balance support: Bilateral upper extremity supported;During functional activity Standing balance-Leahy Scale: Fair Standing balance comment: deferred                           ADL either performed or assessed with clinical judgement   ADL Overall ADL's : Needs assistance/impaired                     Lower Body Dressing: Minimal assistance;Moderate assistance;Sitting/lateral leans Lower Body Dressing Details (indicate cue type and reason): use of grabber education                     Vision       Perception     Praxis      Cognition Arousal/Alertness: Awake/alert Behavior During Therapy: WFL for tasks assessed/performed Overall Cognitive Status: Within Functional Limits for tasks assessed                                 General Comments: HOH, appropriate and oriented, requires cues for  precautions          Exercises Other Exercises Other Exercises: OT engaes pt in ed re: TLSO donning/doffing including demo, ed re: use of grabber for LB ADLs, precautions and rationale. Pt with moderate reception and good carryover.   Shoulder Instructions       General Comments      Pertinent Vitals/ Pain       Pain Assessment: Faces Faces Pain Scale: Hurts little more Pain Location: Back, during activity Pain Descriptors / Indicators: Aching;Sore Pain Intervention(s): Limited activity within patient's tolerance;Monitored  during session  Home Living                                          Prior Functioning/Environment              Frequency  Min 2X/week        Progress Toward Goals  OT Goals(current goals can now be found in the care plan section)  Progress towards OT goals: Progressing toward goals  Acute Rehab OT Goals Patient Stated Goal: get better and go home OT Goal Formulation: With patient Time For Goal Achievement: 12/12/20 Potential to Achieve Goals: Good  Plan Discharge plan remains appropriate    Co-evaluation                 AM-PAC OT "6 Clicks" Daily Activity     Outcome Measure     Help from another person taking care of personal grooming?: A Little Help from another person toileting, which includes using toliet, bedpan, or urinal?: A Little Help from another person bathing (including washing, rinsing, drying)?: A Little Help from another person to put on and taking off regular upper body clothing?: A Little Help from another person to put on and taking off regular lower body clothing?: A Little 6 Click Score: 15    End of Session    OT Visit Diagnosis: Other abnormalities of gait and mobility (R26.89);Muscle weakness (generalized) (M62.81);History of falling (Z91.81);Pain Pain - part of body:  (back)   Activity Tolerance Patient tolerated treatment well   Patient Left in bed;with call bell/phone within reach;with bed alarm set   Nurse Communication Mobility status        Time: 0076-2263 OT Time Calculation (min): 17 min  Charges: OT General Charges $OT Visit: 1 Visit OT Treatments $Self Care/Home Management : 8-22 mins  Gerrianne Scale, [place], OTR/L ascom [phone] 11/29/20, 6:34 PM

## 2020-11-29 NOTE — Progress Notes (Signed)
Physical Therapy Treatment Patient Details Name: Chris Kent MRN: [id number] DOB: 1936/01/03 Today's Date: 11/29/2020   History of Present Illness Pt is a 85 y.o. 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  Recent admission to SNF following a fall 10/26/20. PMH includes COPD (1L O2 at night), PVD, HTN.    PT Comments    Pt in bed, denies pain at rest but increases with activity. Today's treatment session focused on donning/doffing TLSO. Pt requires significant assistance with application but states family will be available for application upon discharge. Pt progressed with bed mobility and required less assistance with trunk coming to EOB. Pt was able to position UE and roll without verbal cues this session. Discharge recommendations remain HHPT. Skilled PT intervention is indicated to address deficits in function, mobility, and to return to PLOF as able.     Recommendations for follow up therapy are one component of a multi-disciplinary discharge planning process, led by the attending physician.  Recommendations may be updated based on patient status, additional functional criteria and insurance authorization.  Follow Up Recommendations  Home health PT     Assistance Recommended at Discharge Intermittent Supervision/Assistance  Equipment Recommendations  3in1 (PT)    Recommendations for Other Services       Precautions / Restrictions Precautions Precautions: Fall;Back Required Braces or Orthoses: Spinal Brace Spinal Brace: Thoracolumbosacral orthotic Restrictions Weight Bearing Restrictions: No Other Position/Activity Restrictions: TLSO for OOB.     Mobility  Bed Mobility Overal bed mobility: Needs Assistance Bed Mobility: Rolling;Sidelying to Sit Rolling: Min guard Sidelying to sit: Min assist       General bed mobility comments: slight min-A for trunk  supine > sit and returning to bed for slight assist to LE    Transfers Overall transfer level: Needs assistance Equipment used: Rolling walker (2 wheels) Transfers: Sit to/from Stand Sit to Stand: Min guard                Ambulation/Gait             General Gait Details: Deferred for brace donning/doffing training   Stairs             Wheelchair Mobility    Modified Rankin (Stroke Patients Only)       Balance Overall balance assessment: Needs assistance Sitting-balance support: Single extremity supported;Feet supported Sitting balance-Leahy Scale: Good     Standing balance support: Bilateral upper extremity supported;During functional activity Standing balance-Leahy Scale: Fair Standing balance comment: able to stand and reach across midline without LOB                            Cognition Arousal/Alertness: Awake/alert Behavior During Therapy: WFL for tasks assessed/performed Overall Cognitive Status: Within Functional Limits for tasks assessed                                          Exercises Other Exercises Other Exercises: Pt education: demonstrated and pt practiced donning/doffing brace, requires extensive verbal and tactile cues for application    General Comments        Pertinent Vitals/Pain Pain Assessment: Faces Faces Pain Scale: Hurts even more Pain Location: Back, during activity Pain Descriptors / Indicators: Aching;Sore Pain Intervention(s): Limited activity within  patient's tolerance;Monitored during session;Repositioned;Premedicated before session    Home Living                          Prior Function            PT Goals (current goals can now be found in the care plan section) Progress towards PT goals: Progressing toward goals    Frequency    7X/week      PT Plan Current plan remains appropriate    Co-evaluation              AM-PAC PT "6 Clicks" Mobility    Outcome Measure  Help needed turning from your back to your side while in a flat bed without using bedrails?: A Little Help needed moving from lying on your back to sitting on the side of a flat bed without using bedrails?: A Little Help needed moving to and from a bed to a chair (including a wheelchair)?: A Little Help needed standing up from a chair using your arms (e.g., wheelchair or bedside chair)?: A Little Help needed to walk in hospital room?: A Little Help needed climbing 3-5 steps with a railing? : A Little 6 Click Score: 18    End of Session Equipment Utilized During Treatment: Gait belt;Back brace Activity Tolerance: Patient tolerated treatment well Patient left: in bed;with call bell/phone within reach;with bed alarm set Nurse Communication: Mobility status PT Visit Diagnosis: Muscle weakness (generalized) (M62.81);Pain;Difficulty in walking, not elsewhere classified (R26.2)     Time: 6599-3570 PT Time Calculation (min) (ACUTE ONLY): 30 min  Charges:  $Therapeutic Activity: 23-37 mins                     [employer], SPT

## 2020-11-30 LAB — BASIC METABOLIC PANEL
Anion gap: 6 (ref 5–15)
BUN: 25 mg/dL — ABNORMAL HIGH (ref 8–23)
CO2: 28 mmol/L (ref 22–32)
Calcium: 8.1 mg/dL — ABNORMAL LOW (ref 8.9–10.3)
Chloride: 102 mmol/L (ref 98–111)
Creatinine, Ser: 0.78 mg/dL (ref 0.61–1.24)
GFR, Estimated: >60 mL/min (ref >60)
Glucose, Bld: 114 mg/dL — ABNORMAL HIGH (ref 70–99)
Potassium: 4.1 mmol/L (ref 3.5–5.1)
Sodium: 136 mmol/L (ref 135–145)

## 2020-11-30 LAB — CBC
HCT: 37.7 % — ABNORMAL LOW (ref 39.0–52.0)
Hemoglobin: 12.8 g/dL — ABNORMAL LOW (ref 13.0–17.0)
MCH: 31.1 pg (ref 26.0–34.0)
MCHC: 34 g/dL (ref 30.0–36.0)
MCV: 91.7 fL (ref 80.0–100.0)
Platelets: 122 10*3/uL — ABNORMAL LOW (ref 150–400)
RBC: 4.11 MIL/uL — ABNORMAL LOW (ref 4.22–5.81)
RDW: 14.3 % (ref 11.5–15.5)
WBC: 10.4 10*3/uL (ref 4.0–10.5)
nRBC: 0 % (ref 0.0–0.2)

## 2020-11-30 LAB — GLUCOSE, CAPILLARY
Glucose-Capillary: 108 mg/dL — ABNORMAL HIGH (ref 70–99)
Glucose-Capillary: 148 mg/dL — ABNORMAL HIGH (ref 70–99)
Glucose-Capillary: 106 mg/dL — ABNORMAL HIGH (ref 70–99)

## 2020-11-30 LAB — MAGNESIUM: Magnesium: 1.9 mg/dL (ref 1.7–2.4)

## 2020-11-30 MED ORDER — NITROFURANTOIN MONOHYD MACRO 100 MG PO CAPS: 100.0000 mg | ORAL_CAPSULE | Freq: Two times a day (BID) | ORAL | 0 refills | Status: AC

## 2020-11-30 MED ORDER — SENNOSIDES-DOCUSATE SODIUM 8.6-50 MG PO TABS: 2.0000 | ORAL_TABLET | Freq: Two times a day (BID) | ORAL | Status: DC

## 2020-11-30 MED ORDER — POLYETHYLENE GLYCOL 3350 17 G PO PACK: 17.0000 g | PACK | Freq: Two times a day (BID) | ORAL | 0 refills | Status: DC

## 2020-11-30 MED ORDER — OXYCODONE-ACETAMINOPHEN 5-325 MG PO TABS: 1.0000 | ORAL_TABLET | ORAL | 0 refills | Status: AC | PRN

## 2020-11-30 MED ORDER — CLOPIDOGREL BISULFATE 75 MG PO TABS: 75.0000 mg | ORAL_TABLET | Freq: Every day | ORAL | Status: DC

## 2020-11-30 NOTE — Care Management Important Message (Signed)
Important Message  Patient Details  Name: [name] MRN: [id number] Date of Birth: November 05, 1935   Medicare Important Message Given:  Yes     [name] [name] 11/30/2020, 2:05 PM

## 2020-11-30 NOTE — Progress Notes (Signed)
Progress Note   Date: 11/30/2020  History of Present Illness:  Chris Kent is a 85 y.o with a history of BPH, HLD, HTN, GERD, CAD, and CVA on Plavix presenting with 1 month of mid back pain. He had a fall while weed eating about 1 month ago resulting in a thoracic fracture. He was subsequently placed at a SNF due to the need for additional assistance as he lives alone.  He returned to his PCP yesterday for hospital follow up and continued back pain and was subsequently sent for updated CT scan showing T11 fracture with 3 column involvement. He presented to the ER for further evaluation and treatment. S/p T9-L2 fixation on 11/27/20  POD#3: NAEO  POD#2: increased pain yesterday with PT but still reporting improvement from pre-op.  POD#1: NAEO. Pt reports improved back pain post-operative. Has not ambulated yet.   Vital Signs: Temp:  [97.7 F (36.5 C)-98.5 F (36.9 C)] 98.3 F (36.8 C) (10/27 1118) Pulse Rate:  [76-81] 80 (10/27 1118) Resp:  [16-18] 18 (10/27 1118) BP: (114-134)/(55-69) 121/55 (10/27 1118) SpO2:  [92 %-95 %] 95 % (10/27 1118) Temp (24hrs), Avg:98.1 F (36.7 C), Min:97.7 F (36.5 C), Max:98.5 F (36.9 C)  Weight: 102.1 kg   Problem List Patient Active Problem List   Diagnosis Date Noted   Unstable burst fracture of t11-T12 vertebra, subsequent encounter for fracture with delayed healing    Fracture, thoracic vertebra ([place]) 11/22/2020   History of stroke    Asthma    Fall 10/26/2020   Erectile dysfunction due to arterial insufficiency 06/29/2019   Nephrolithiasis 06/25/2018   Anxiety 11/21/2017   Asthma without status asthmaticus 11/21/2017   Esophagitis 11/21/2017   Glaucoma 11/21/2017   History of colonic polyps 11/21/2017   Hyperglycemia 11/21/2017   Hypertension 11/21/2017   Osteoarthritis 11/21/2017   Thyroid nodule 11/21/2017   Transient global amnesia 11/21/2017   Status post total bilateral knee replacement 01/12/2017   Essential  hypertension, benign 03/05/2016   Hyperlipidemia 03/05/2016   Carotid stenosis 03/05/2016   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   Thrombocytopenia ([place]) 03/03/2014   Obstructive sleep apnea hypopnea, moderate 06/14/2013   Pulmonary nodules/lesions, multiple 06/14/2013   Renal cyst, acquired 04/06/2013   Enlarged prostate with lower urinary tract symptoms (LUTS) 03/18/2012   Erectile dysfunction 03/18/2012   Increased frequency of urination 03/18/2012   Nocturia 03/18/2012    Medications: Scheduled Meds:  Chlorhexidine Gluconate Cloth  6 each Topical Daily   dorzolamidel-timolol  1 drop Right Eye BID   enoxaparin (LOVENOX) injection  40 mg Subcutaneous Q24H   latanoprost  1 drop Both Eyes QHS   loratadine  10 mg Oral Daily   losartan  50 mg Oral BID   metoprolol succinate  50 mg Oral Daily   mometasone-formoterol  2 puff Inhalation BID   polyethylene glycol  17 g Oral Daily   rosuvastatin  10 mg Oral QHS   senna-docusate  2 tablet Oral BID   tamsulosin  0.8 mg Oral QPC supper   Continuous Infusions:  ampicillin (OMNIPEN) IV 2 g (11/30/20 1159)   PRN Meds:.acetaminophen, albuterol, bisacodyl, dextromethorphan-guaiFENesin, fentaNYL (SUBLIMAZE) injection, hydrALAZINE, ipratropium-albuterol, methocarbamol, morphine injection, ondansetron (ZOFRAN) IV, oxyCODONE-acetaminophen, oxyCODONE-acetaminophen, zolpidem  Labs:  Lab Results  Component Value Date   WBC 10.4 11/30/2020   WBC 14.9 (H) 11/29/2020   HCT 37.7 (L) 11/30/2020   HCT 38.4 (L) 11/29/2020   HCT 50.1 09/19/2012   HCT 50.0 03/23/2011   PLT 122 (L) 11/30/2020   PLT 127 (  L) 11/29/2020   PLT 154 09/19/2012   PLT 132 (L) 03/23/2011    Lab Results  Component Value Date   INR 1.1 11/22/2020   APTT 25 11/22/2020    Lab Results  Component Value Date   NA 136 11/30/2020   NA 134 (L) 11/29/2020   NA 141 09/19/2012   NA 150 (H) 03/23/2011   K 4.1 11/30/2020   K 4.2 11/29/2020   K 4.2 09/19/2012   K 4.4 03/23/2011   BUN  25 (H) 11/30/2020   BUN 34 (H) 11/29/2020   BUN 17 09/19/2012   BUN 18 03/23/2011    Lab Results  Component Value Date   MG 1.9 11/30/2020    Exam: A&Ox3  Good and equal strength in BLE  Incision c/d/I with staples and clean dressing in place.  Assessment/Plan: [name] is here with 3 column fracture at T11/12 in setting of ankylosis and need stabilization. S/p T9-L2 PSF 11/27/20  - thoracolumbar xrays show good hardware placement - replaced post-op dressing today. - Pt to continue TLSO when ambulating. Please remove when patient is in bed.  - discussed with the patient of moving from side to side in bed to relieve incisional pressure. - Ok for PT/OT from neurosurgical standpoint  - Ok to resume Plavix if indicated on POD#14 (12/12/20) - Ok for DVT ppx from neurosurgical standpoint.   Loleta Dicker, PA-C Neurosurgery

## 2020-11-30 NOTE — Progress Notes (Signed)
Physical Therapy Treatment Patient Details Name: Chris Kent MRN: [id number] DOB: 1936/01/27 Today's Date: 11/30/2020   History of Present Illness Pt is a 85 y.o. 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  Recent admission to SNF following a fall 10/26/20. PMH includes COPD (1L O2 at night), PVD, HTN.    PT Comments    Pt stated he feels weak today but upon further questioning he stated it is about the same as yesterday.  To EOB with min assist and cues.  Steady in sitting and brace donned.  He is able to stand and complete x 1 lap on unit with RW and min guard/assist.  Some extra forward lean but overall does well.  Remains in recliner.   Recommendations for follow up therapy are one component of a multi-disciplinary discharge planning process, led by the attending physician.  Recommendations may be updated based on patient status, additional functional criteria and insurance authorization.  Follow Up Recommendations  Home health PT     Assistance Recommended at Discharge Intermittent Supervision/Assistance  Equipment Recommendations  3in1 (PT)    Recommendations for Other Services       Precautions / Restrictions Precautions Precautions: Fall;Back Precaution Comments: recalls 2/4 back precautions before tx, 4/4 after Required Braces or Orthoses: Spinal Brace Spinal Brace: Thoracolumbosacral orthotic Restrictions Weight Bearing Restrictions: No Other Position/Activity Restrictions: TLSO for OOB.     Mobility  Bed Mobility Overal bed mobility: Needs Assistance Bed Mobility: Supine to Sit     Supine to sit: Min assist     General bed mobility comments: cues for log rolling and hand placements    Transfers Overall transfer level: Needs assistance Equipment used: Rolling walker (2 wheels) Transfers: Sit to/from Stand Sit to Stand: Min assist;Min  guard           General transfer comment: cues for hand placement    Ambulation/Gait Ambulation/Gait assistance: Min guard Gait Distance (Feet): 180 Feet Assistive device: Rolling walker (2 wheels) Gait Pattern/deviations: Decreased step length - right;Decreased step length - left;Decreased dorsiflexion - right;Decreased dorsiflexion - left;Decreased stride length;Trunk flexed Gait velocity: decreased   General Gait Details: brace donned for session   Stairs             Wheelchair Mobility    Modified Rankin (Stroke Patients Only)       Balance Overall balance assessment: Needs assistance Sitting-balance support: Single extremity supported;Feet supported Sitting balance-Leahy Scale: Good     Standing balance support: Bilateral upper extremity supported;During functional activity Standing balance-Leahy Scale: Fair Standing balance comment: deferred                            Cognition Arousal/Alertness: Awake/alert Behavior During Therapy: WFL for tasks assessed/performed Overall Cognitive Status: Within Functional Limits for tasks assessed                                          Exercises      General Comments        Pertinent Vitals/Pain Pain Assessment: Faces Faces Pain Scale: Hurts little more Pain Location: Back, during activity Pain Descriptors / Indicators: Aching;Sore Pain Intervention(s): Limited activity within patient's tolerance;Monitored during session    Home Living  Prior Function            PT Goals (current goals can now be found in the care plan section) Progress towards PT goals: Progressing toward goals    Frequency    7X/week      PT Plan Current plan remains appropriate    Co-evaluation              AM-PAC PT "6 Clicks" Mobility   Outcome Measure  Help needed turning from your back to your side while in a flat bed without using bedrails?:  A Little Help needed moving from lying on your back to sitting on the side of a flat bed without using bedrails?: A Little Help needed moving to and from a bed to a chair (including a wheelchair)?: A Little Help needed standing up from a chair using your arms (e.g., wheelchair or bedside chair)?: A Little Help needed to walk in hospital room?: A Little Help needed climbing 3-5 steps with a railing? : A Little 6 Click Score: 18    End of Session Equipment Utilized During Treatment: Gait belt;Back brace Activity Tolerance: Patient tolerated treatment well Patient left: in bed;with call bell/phone within reach;with bed alarm set Nurse Communication: Mobility status PT Visit Diagnosis: Muscle weakness (generalized) (M62.81);Pain;Difficulty in walking, not elsewhere classified (R26.2)     Time: 6712-4580 PT Time Calculation (min) (ACUTE ONLY): 13 min  Charges:  $Gait Training: 8-22 mins                    Chesley Noon, PTA 11/30/20, 12:38 PM

## 2020-11-30 NOTE — Plan of Care (Signed)

## 2020-11-30 NOTE — Progress Notes (Signed)
Met with the patient in the room at the bedside, we discussed the number of days he used already at [facility] and discussed once 20 days are used he goes into copay days,  He stated that he will go home, [facility] and PT He has DME at home and does not need additional He stated that his daughter and grand son will help at home He has transportation with his daughter He can afford his medication

## 2020-11-30 NOTE — Plan of Care (Signed)
Patient discharged home per MD orders at this time.All discharge instructions,education and medications reviewed with patient at the bedside.patient expressed understanding and will comply with dc instructions.follow up appointments was also communicated to patient and family.no verbal c/o or any ssx of distress.patient was transported by grandson in a private car.

## 2020-11-30 NOTE — Discharge Summary (Signed)
Physician Discharge Summary   Chris Kent  male DOB: 1935/11/04  AST:[id number]  PCP: Idelle Crouch, MD  Admit date: 11/22/2020 Discharge date: 11/30/2020  Admitted From: home Disposition:  home Daughter updated on the phone prior to discharge.  Home Health: Yes CODE STATUS: Full code  Discharge Instructions     Discharge instructions   Complete by: As directed    Please wear brace when you are up and walking.  No need to wear when in bed.  Resume Plavix on 12/12/20.  Follow up in clinic with neurosurgery 2 weeks after discharge.  Please finish 5 more days of Macrobid for your UTI (urinary track infection).   Dr. Enzo Bi - -   No wound care   Complete by: As directed         Hospital Course:  For full details, please see H&P, progress notes, consult notes and ancillary notes.  Briefly,  Chris Kent is a 85 y.o. male with medical history significant of hypertension, hyperlipidemia, asthma on 1 L oxygen, stroke, anxiety, OSA not on CPAP, PVD, BPH, who presented with back pain.   Pt had a fall resulting in possible acute fracture of the anterior bridging osteophytes of T11 and T12,. Pt was hospitalized from 9/20 to 9/28 and discharged to nursing home. He continued to have back pain after he went home. He returned to his PCP the day PTA for hospital follow up and had CT scan showing unstable T11 fracture.    Neurosurgery was consulted and decided to take him to the OR for fusion after Plavix washout.     Fracture, thoracic vertebra [facility]):   unstable three column injury S/p POSTERIOR THORACIC FUSION T9-L2 on 11/27/20 --TLSO only with ambulation --resume plavix on 12/12/20 --neurosurgery cleared for discharge. --Follow up in clinic with neurosurgery 2 weeks after discharge.   CAUTI, not POA --pt complained of dysuria while Foley cath was in (placed prior to surgery).  Foley was removed.  UA looks infected, urine culture pos for Enterococcus  faecalis --Pt was started on IV ampicillin and discharged on 5 more days of Macrobid.   Enlarged prostate with lower urinary tract symptoms (LUTS) --Continue Flomax   HLD: -Continue Crestor   Hypertension -cont home losartan and Toprol   History of stroke -cont statin -Keep holding Plavix, can restart on 12/12/2020 per neurosurgery recommendations   Asthma: Stable --cont daily bronchodilators   Discharge Diagnoses:  Principal Problem:   Fracture, thoracic vertebra ([place]) Active Problems:   Hyperlipidemia   Enlarged prostate with lower urinary tract symptoms (LUTS)   Hypertension   History of stroke   Asthma   Unstable burst fracture of t11-T12 vertebra, subsequent encounter for fracture with delayed healing   30 Day Unplanned Readmission Risk Score    Flowsheet Row ED to Hosp-Admission (Current) from 11/22/2020 in [place] (1A)  30 Day Unplanned Readmission Risk Score (%) 17.35 Filed at 11/30/2020 1201       This score is the patient's risk of an unplanned readmission within 30 days of being discharged (0 -100%). The score is based on dignosis, age, lab data, medications, orders, and past utilization.   Low:  0-14.9   Medium: 15-21.9   High: 22-29.9   Extreme: 30 and above         Discharge Instructions:  Allergies as of 11/30/2020   No Known Allergies      Medication List     STOP taking these medications  albuterol (2.5 MG/3ML) 0.083% nebulizer solution Commonly known as: PROVENTIL   budesonide-formoterol 160-4.5 MCG/ACT inhaler Commonly known as: SYMBICORT   cyanocobalamin 1000 MCG tablet   enoxaparin 60 MG/0.6ML injection Commonly known as: LOVENOX   melatonin 5 MG Tabs   methocarbamol 500 MG tablet Commonly known as: ROBAXIN   oxyCODONE 5 MG immediate release tablet Commonly known as: Oxy IR/ROXICODONE   pantoprazole 40 MG tablet Commonly known as: PROTONIX   Vitamin D (Ergocalciferol) 1.25 MG  (50000 UNIT) Caps capsule Commonly known as: DRISDOL       TAKE these medications    acetaminophen 500 MG tablet Commonly known as: TYLENOL Take 2 tablets (1,000 mg total) by mouth 3 (three) times daily.   bisacodyl 10 MG suppository Commonly known as: DULCOLAX Place 1 suppository (10 mg total) rectally daily as needed for moderate constipation.   cetirizine 10 MG tablet Commonly known as: ZYRTEC Take 10 mg by mouth at bedtime.   clopidogrel 75 MG tablet Commonly known as: PLAVIX Take 1 tablet (75 mg total) by mouth daily. Resume 14 days after your surgery, which is 12/12/20. Start taking on: December 12, 2020 What changed:  additional instructions These instructions start on December 12, 2020. If you are unsure what to do until then, ask your doctor or other care provider.   dorzolamidel-timolol 22.3-6.8 MG/ML Soln ophthalmic solution Commonly known as: COSOPT Place 1 drop into the right eye 2 (two) times daily.   latanoprost 0.005 % ophthalmic solution Commonly known as: XALATAN Place 1 drop into both eyes at bedtime.   losartan 50 MG tablet Commonly known as: COZAAR Take 50 mg by mouth 2 (two) times daily.   metoprolol succinate 50 MG 24 hr tablet Commonly known as: TOPROL-XL Take 1 tablet (50 mg total) by mouth daily. Take with or immediately following a meal.   naproxen 250 MG tablet Commonly known as: NAPROSYN Take 1 tablet (250 mg total) by mouth 2 (two) times daily as needed (for pain not relieved with oxycodone).   nitrofurantoin (macrocrystal-monohydrate) 100 MG capsule Commonly known as: Macrobid Take 1 capsule (100 mg total) by mouth 2 (two) times daily for 5 days.   oxyCODONE-acetaminophen 5-325 MG tablet Commonly known as: PERCOCET/ROXICET Take 1-2 tablets by mouth every 4 (four) hours as needed for up to 7 days for moderate pain or severe pain.   polyethylene glycol 17 g packet Commonly known as: MIRALAX / GLYCOLAX Take 17 g by mouth 2 (two) times  daily. While you are on pain medication. What changed:  when to take this additional instructions   rosuvastatin 10 MG tablet Commonly known as: CRESTOR Take 10 mg by mouth at bedtime.   senna-docusate 8.6-50 MG tablet Commonly known as: Senokot-S Take 2 tablets by mouth 2 (two) times daily. While you are on pain medication. What changed:  when to take this additional instructions   tamsulosin 0.4 MG Caps capsule Commonly known as: FLOMAX Take 2 capsules (0.8 mg total) by mouth daily after supper.   triamcinolone cream 0.1 % Commonly known as: KENALOG Apply 1 application topically 3 (three) times daily. (Apply to rash on legs)   Wixela Inhub 250-50 MCG/ACT Aepb Generic drug: fluticasone-salmeterol Inhale 1 puff into the lungs 2 (two) times daily.   zolpidem 10 MG tablet Commonly known as: AMBIEN Take 10 mg by mouth at bedtime as needed for sleep.         Follow-up Information     Loleta Dicker, PA Follow up in 2  week(s).   Why: for staple removal and post-op f/u at [facility] information: [place] [phone]         Idelle Crouch, MD Follow up in 1 week(s).   Specialty: Internal Medicine Contact information: [place] [phone]                 No Known Allergies   The results of significant diagnostics from this hospitalization (including imaging, microbiology, ancillary and laboratory) are listed below for reference.   Consultations:   Procedures/Studies: DG Thoracic Spine 2 View  Result Date: 11/27/2020 CLINICAL DATA:  ORIF spine fracture EXAM: THORACIC SPINE 2 VIEWS; DG C-ARM 1-60 MIN COMPARISON:  MRI 11/22/2020, CT 10/26/2020 FINDINGS: Four low resolution intraoperative spot views of the thoracic spine. Total fluoroscopy time was 5 minutes. Images demonstrate posterior spinal rods and fixating screws at the thoracolumbar  spine. Ankylosis of the spine with fracture through syndesmophyte at T11-T12 and slightly widened disc space as demonstrated on prior CT. IMPRESSION: Intraoperative fluoroscopic assistance provided during surgical fixation of thoracic spine fracture Electronically Signed   By: Donavan Foil M.D.   On: 11/27/2020 22:06   DG THORACOLUMABAR SPINE  Result Date: 11/28/2020 CLINICAL DATA:  Spinal fusion EXAM: THORACOLUMBAR SPINE 1V COMPARISON:  FX spine radiograph 11/27/2020 FINDINGS: Chronic findings of ankylosing spondylitis. New T9-L2 posterior fusion fixating a T11 fracture. Intact hardware without evidence of loosening. IMPRESSION: Ankylosing spondylitis. Postsurgical changes of T9-L2 posterior fusion for a T11 fracture. No evidence of immediate hardware complication. Electronically Signed   By: Maurine Simmering M.D.   On: 11/28/2020 11:44   CT THORACIC SPINE WO CONTRAST  Result Date: 11/21/2020 CLINICAL DATA:  Thoracic region back pain. EXAM: CT THORACIC SPINE WITHOUT CONTRAST TECHNIQUE: Multidetector CT images of the thoracic were obtained using the standard protocol without intravenous contrast. COMPARISON:  Chest CT 10/26/2020 FINDINGS: Alignment: Normal except for 2 mm of anterolisthesis at the T11 level related to the fracture described below. Vertebrae: Solid ankylosis from the lower cervical region 2 T11. Previous posterior fusion procedure in the cervical and upper thoracic region to the level of T2. There is an unstable fracture of the T11 level involving the posterior elements and inferior vertebral body. There is anterolisthesis of 2 mm. T12 and L1 appear normal. Paraspinal and other soft tissues: Negative Disc levels: No evidence of compressive canal or foraminal stenosis. IMPRESSION: Background pattern of ankylosis throughout the region consistent with the diagnosis of ankylosing spondylitis. Fracture at T11 affecting the posterior elements and vertebral body with anterolisthesis of 2 mm. This  would be an unstable or potentially unstable fracture. Call report in progress. Electronically Signed   By: Nelson Chimes M.D.   On: 11/21/2020 15:55   CT LUMBAR SPINE WO CONTRAST  Result Date: 11/21/2020 CLINICAL DATA:  Back and lumbar region pain EXAM: CT LUMBAR SPINE WITHOUT CONTRAST TECHNIQUE: Multidetector CT imaging of the lumbar spine was performed without intravenous contrast administration. Multiplanar CT image reconstructions were also generated. COMPARISON:  None. FINDINGS: Segmentation: 5 lumbar type vertebral bodies. Alignment: No malalignment. Vertebrae and disc levels: Ankylosis throughout the region at each intervertebral level and complete ankylosis of the sacroiliac joints, typical of ankylosing spondylitis. No evidence of regional fracture. There is chronic bony foraminal stenosis on the right at C2-3. Other foramina appear sufficiently patent. No canal stenosis. Paraspinal and other soft tissues: Negative other than aortic atherosclerosis without visible aneurysm. IMPRESSION: Chronic  findings of ankylosing spondylitis with solid ankylosis throughout the region, including both sacroiliac joints. No acute or traumatic finding. 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 No other compressive canal or foraminal narrowing suspected. Electronically Signed   By: Nelson Chimes M.D.   On: 11/21/2020 15:51   MR THORACIC SPINE WO CONTRAST  Result Date: 11/22/2020 CLINICAL DATA:  Spine fracture, traumatic EXAM: MRI THORACIC SPINE WITHOUT CONTRAST TECHNIQUE: Multiplanar, multisequence MR imaging of the thoracic spine was performed. No intravenous contrast was administered. COMPARISON:  11/21/2020 CT thoracic spine FINDINGS: Evaluation is somewhat limited by inability to cross reference from the axial to sagittal series, as the localizer had to be re-run. Alignment: Physiologic. Vertebrae: Decreased T1 signal and increased T2 signal at the inferior aspect of T11 and the  superior aspect of T12, concerning for acute fractures. The T11 fracture extends into the bilateral posterior elements. The T12 fracture does not appear to involve the posterior elements. Increased T2 signal is also seen at the posteroinferior aspect of L1. No evidence of retropulsion of fracture fragments. Cord: Normal signal and morphology. No evidence of cord compression or epidural hematoma. Paraspinal and other soft tissues: Negative. Disc levels: No high-grade spinal canal stenosis. Neural foraminal narrowing at T11-T12 bilaterally, secondary to facet fracture. IMPRESSION: 1. Increased T2 signal at the inferior aspect of T11, extending into the bilateral posterior elements, concerning for 3 column injury. 2. Increased T2 signal at the superior aspect of T12 and at the inferior posterior aspect of L1, also concerning for acute fractures, without involvement of the posterior elements. 3. No evidence of retropulsion, cord compression, or epidural hematoma. These results were called by telephone at the time of interpretation on 11/22/2020 at 4:02 am to provider [facility] , who verbally acknowledged these results. Electronically Signed   By: Merilyn Baba M.D.   On: 11/22/2020 04:04   DG C-Arm 1-60 Min  Result Date: 11/27/2020 CLINICAL DATA:  ORIF spine fracture EXAM: THORACIC SPINE 2 VIEWS; DG C-ARM 1-60 MIN COMPARISON:  MRI 11/22/2020, CT 10/26/2020 FINDINGS: Four low resolution intraoperative spot views of the thoracic spine. Total fluoroscopy time was 5 minutes. Images demonstrate posterior spinal rods and fixating screws at the thoracolumbar spine. Ankylosis of the spine with fracture through syndesmophyte at T11-T12 and slightly widened disc space as demonstrated on prior CT. IMPRESSION: Intraoperative fluoroscopic assistance provided during surgical fixation of thoracic spine fracture Electronically Signed   By: Donavan Foil M.D.   On: 11/27/2020 22:06   DG C-Arm 1-60 Min-No Report  Result  Date: 11/27/2020 Fluoroscopy was utilized by the requesting physician.  No radiographic interpretation.   DG C-Arm 1-60 Min-No Report  Result Date: 11/27/2020 Fluoroscopy was utilized by the requesting physician.  No radiographic interpretation.      Labs: BNP (last 3 results) Recent Labs    11/22/20 0630  BNP 24.2   Basic Metabolic Panel: Recent Labs  Lab 11/27/20 1056 11/29/20 0136 11/30/20 0455  NA 136 134* 136  K 4.0 4.2 4.1  CL 101 103 102  CO2 27 29 28   GLUCOSE 106* 137* 114*  BUN 25* 34* 25*  CREATININE 0.86 1.09 0.78  CALCIUM 8.8* 8.2* 8.1*  MG  --   --  1.9   Liver Function Tests: No results for input(s): AST, ALT, ALKPHOS, BILITOT, PROT, ALBUMIN in the last 168 hours. No results for input(s): LIPASE, AMYLASE in the last 168 hours. No results for input(s): AMMONIA in the last 168 hours.  CBC: Recent Labs  Lab 11/27/20 1056 11/29/20 0136 11/30/20 0455  WBC 9.3 14.9* 10.4  HGB 15.0 12.8* 12.8*  HCT 44.5 38.4* 37.7*  MCV 94.1 92.5 91.7  PLT 106* 127* 122*   Cardiac Enzymes: No results for input(s): CKTOTAL, CKMB, CKMBINDEX, TROPONINI in the last 168 hours. BNP: Invalid input(s): POCBNP CBG: Recent Labs  Lab 11/28/20 0853 11/28/20 1614 11/29/20 0737 11/30/20 0750 11/30/20 1118  GLUCAP 178* 140* 128* 108* 148*   D-Dimer No results for input(s): DDIMER in the last 72 hours. Hgb A1c No results for input(s): HGBA1C in the last 72 hours. Lipid Profile No results for input(s): CHOL, HDL, LDLCALC, TRIG, CHOLHDL, LDLDIRECT in the last 72 hours. Thyroid function studies No results for input(s): TSH, T4TOTAL, T3FREE, THYROIDAB in the last 72 hours.  Invalid input(s): FREET3 Anemia work up No results for input(s): VITAMINB12, FOLATE, FERRITIN, TIBC, IRON, RETICCTPCT in the last 72 hours. Urinalysis    Component Value Date/Time   COLORURINE AMBER (A) 11/27/2020 1440   APPEARANCEUR CLOUDY (A) 11/27/2020 1440   APPEARANCEUR Clear 11/21/2017 1011    LABSPEC 1.020 11/27/2020 1440   LABSPEC 1.013 09/20/2012 0122   PHURINE 5.0 11/27/2020 1440   GLUCOSEU NEGATIVE 11/27/2020 1440   GLUCOSEU Negative 09/20/2012 0122   HGBUR LARGE (A) 11/27/2020 1440   BILIRUBINUR NEGATIVE 11/27/2020 1440   BILIRUBINUR Negative 11/21/2017 1011   BILIRUBINUR Negative 09/20/2012 0122   KETONESUR NEGATIVE 11/27/2020 1440   PROTEINUR 30 (A) 11/27/2020 1440   NITRITE NEGATIVE 11/27/2020 1440   LEUKOCYTESUR LARGE (A) 11/27/2020 1440   LEUKOCYTESUR Negative 09/20/2012 0122   Sepsis Labs Invalid input(s): PROCALCITONIN,  WBC,  LACTICIDVEN Microbiology Recent Results (from the past 240 hour(s))  Resp Panel by RT-PCR (Flu A&B, Covid) Nasopharyngeal Swab     Status: None   Collection Time: 11/22/20  6:30 AM   Specimen: Nasopharyngeal Swab; Nasopharyngeal(NP) swabs in vial transport medium  Result Value Ref Range Status   SARS Coronavirus 2 by RT PCR NEGATIVE NEGATIVE Final    Comment: (NOTE) SARS-CoV-2 target nucleic acids are NOT DETECTED.  The SARS-CoV-2 RNA is generally detectable in upper respiratory specimens during the acute phase of infection. The lowest concentration of SARS-CoV-2 viral copies this assay can detect is 138 copies/mL. A negative result does not preclude SARS-Cov-2 infection and should not be used as the sole basis for treatment or other patient management decisions. A negative result may occur with  improper specimen collection/handling, submission of specimen other than nasopharyngeal swab, presence of viral mutation(s) within the areas targeted by this assay, and inadequate number of viral copies(<138 copies/mL). A negative result must be combined with clinical observations, patient history, and epidemiological information. The expected result is Negative.  Fact Sheet for Patients:  EntrepreneurPulse.com.au  Fact Sheet for Healthcare Providers:  IncredibleEmployment.be  This test is no t  yet approved or cleared by the [place] FDA and  has been authorized for detection and/or diagnosis of SARS-CoV-2 by FDA under an Emergency Use Authorization (EUA). This EUA will remain  in effect (meaning this test can be used) for the duration of the COVID-19 declaration under Section 564(b)(1) of the Act, 21 U.S.C.section 360bbb-3(b)(1), unless the authorization is terminated  or revoked sooner.       Influenza A by PCR NEGATIVE NEGATIVE Final   Influenza B by PCR NEGATIVE NEGATIVE Final    Comment: (NOTE) The Xpert Xpress SARS-CoV-2/FLU/RSV plus assay is intended as an aid in the diagnosis of influenza from Nasopharyngeal swab  specimens and should not be used as a sole basis for treatment. Nasal washings and aspirates are unacceptable for Xpert Xpress SARS-CoV-2/FLU/RSV testing.  Fact Sheet for Patients: EntrepreneurPulse.com.au  Fact Sheet for Healthcare Providers: IncredibleEmployment.be  This test is not yet approved or cleared by the [place] FDA and has been authorized for detection and/or diagnosis of SARS-CoV-2 by FDA under an Emergency Use Authorization (EUA). This EUA will remain in effect (meaning this test can be used) for the duration of the COVID-19 declaration under Section 564(b)(1) of the Act, 21 U.S.C. section 360bbb-3(b)(1), unless the authorization is terminated or revoked.  Performed at [facility], [address] [place], [place] [address]   Urine Culture     Status: Abnormal   Collection Time: 11/27/20  2:20 PM   Specimen: Urine, Catheterized  Result Value Ref Range Status   Specimen Description   Final    URINE, CATHETERIZED Performed at [facility], [address] [place], [place] [address]    Special Requests   Final    NONE Performed at [facility], [address] [place],  [address]    Culture >=100,000 COLONIES/mL ENTEROCOCCUS FAECALIS (A)  Final    Report Status 11/29/2020 FINAL  Final   Organism ID, Bacteria ENTEROCOCCUS FAECALIS (A)  Final      Susceptibility   Enterococcus faecalis - MIC*    AMPICILLIN <=2 SENSITIVE Sensitive     NITROFURANTOIN <=16 SENSITIVE Sensitive     VANCOMYCIN 2 SENSITIVE Sensitive     * >=100,000 COLONIES/mL ENTEROCOCCUS FAECALIS  Surgical pcr screen     Status: None   Collection Time: 11/27/20  2:28 PM   Specimen: Nasal Mucosa; Nasal Swab  Result Value Ref Range Status   MRSA, PCR NEGATIVE NEGATIVE Final   Staphylococcus aureus NEGATIVE NEGATIVE Final    Comment: (NOTE) The Xpert SA Assay (FDA approved for NASAL specimens in patients 38 years of age and older), is one component of a comprehensive surveillance program. It is not intended to diagnose infection nor to guide or monitor treatment. Performed at [facility], [address] [place],  [address]      Total time spend on discharging this patient, including the last patient exam, discussing the hospital stay, instructions for ongoing care as it relates to all pertinent caregivers, as well as preparing the medical discharge records, prescriptions, and/or referrals as applicable, is 35 minutes.    Enzo Bi, MD  [facility] 11/30/2020, 1:01 PM

## 2020-12-05 ENCOUNTER — Other Ambulatory Visit: Payer: Self-pay | Admitting: Internal Medicine

## 2020-12-05 DIAGNOSIS — R4182 Altered mental status, unspecified: Secondary | ICD-10-CM | POA: Diagnosis not present

## 2020-12-05 DIAGNOSIS — D696 Thrombocytopenia, unspecified: Secondary | ICD-10-CM | POA: Diagnosis not present

## 2020-12-05 DIAGNOSIS — S22001S Stable burst fracture of unspecified thoracic vertebra, sequela: Secondary | ICD-10-CM

## 2020-12-05 DIAGNOSIS — Z79899 Other long term (current) drug therapy: Secondary | ICD-10-CM | POA: Diagnosis not present

## 2020-12-05 DIAGNOSIS — I10 Essential (primary) hypertension: Secondary | ICD-10-CM | POA: Diagnosis not present

## 2020-12-05 DIAGNOSIS — S22001D Stable burst fracture of unspecified thoracic vertebra, subsequent encounter for fracture with routine healing: Secondary | ICD-10-CM | POA: Diagnosis not present

## 2020-12-05 DIAGNOSIS — E782 Mixed hyperlipidemia: Secondary | ICD-10-CM | POA: Diagnosis not present

## 2020-12-05 DIAGNOSIS — R739 Hyperglycemia, unspecified: Secondary | ICD-10-CM | POA: Diagnosis not present

## 2020-12-06 ENCOUNTER — Ambulatory Visit: Payer: PPO

## 2020-12-06 ENCOUNTER — Other Ambulatory Visit: Payer: Self-pay

## 2020-12-06 ENCOUNTER — Ambulatory Visit
Admission: RE | Admit: 2020-12-06 | Discharge: 2020-12-06 | Disposition: A | Discharge status: Home / Self Care | Payer: PPO | Source: Ambulatory Visit | Attending: Internal Medicine | Admitting: Internal Medicine

## 2020-12-06 DIAGNOSIS — R4182 Altered mental status, unspecified: Secondary | ICD-10-CM | POA: Insufficient documentation

## 2020-12-06 DIAGNOSIS — Z043 Encounter for examination and observation following other accident: Secondary | ICD-10-CM | POA: Diagnosis not present

## 2020-12-07 DIAGNOSIS — N401 Enlarged prostate with lower urinary tract symptoms: Secondary | ICD-10-CM | POA: Diagnosis not present

## 2020-12-07 DIAGNOSIS — I2510 Atherosclerotic heart disease of native coronary artery without angina pectoris: Secondary | ICD-10-CM | POA: Diagnosis not present

## 2020-12-07 DIAGNOSIS — R296 Repeated falls: Secondary | ICD-10-CM | POA: Diagnosis not present

## 2020-12-07 DIAGNOSIS — R0989 Other specified symptoms and signs involving the circulatory and respiratory systems: Secondary | ICD-10-CM | POA: Diagnosis not present

## 2020-12-07 DIAGNOSIS — E669 Obesity, unspecified: Secondary | ICD-10-CM | POA: Diagnosis not present

## 2020-12-07 DIAGNOSIS — I10 Essential (primary) hypertension: Secondary | ICD-10-CM | POA: Diagnosis not present

## 2020-12-07 DIAGNOSIS — F419 Anxiety disorder, unspecified: Secondary | ICD-10-CM | POA: Diagnosis not present

## 2020-12-07 DIAGNOSIS — E538 Deficiency of other specified B group vitamins: Secondary | ICD-10-CM | POA: Diagnosis not present

## 2020-12-07 DIAGNOSIS — Z7902 Long term (current) use of antithrombotics/antiplatelets: Secondary | ICD-10-CM | POA: Diagnosis not present

## 2020-12-07 DIAGNOSIS — Z79891 Long term (current) use of opiate analgesic: Secondary | ICD-10-CM | POA: Diagnosis not present

## 2020-12-07 DIAGNOSIS — R338 Other retention of urine: Secondary | ICD-10-CM | POA: Diagnosis not present

## 2020-12-07 DIAGNOSIS — H409 Unspecified glaucoma: Secondary | ICD-10-CM | POA: Diagnosis not present

## 2020-12-07 DIAGNOSIS — Z6833 Body mass index (BMI) 33.0-33.9, adult: Secondary | ICD-10-CM | POA: Diagnosis not present

## 2020-12-07 DIAGNOSIS — M5440 Lumbago with sciatica, unspecified side: Secondary | ICD-10-CM | POA: Diagnosis not present

## 2020-12-07 DIAGNOSIS — E559 Vitamin D deficiency, unspecified: Secondary | ICD-10-CM | POA: Diagnosis not present

## 2020-12-07 DIAGNOSIS — Z9981 Dependence on supplemental oxygen: Secondary | ICD-10-CM | POA: Diagnosis not present

## 2020-12-07 DIAGNOSIS — J449 Chronic obstructive pulmonary disease, unspecified: Secondary | ICD-10-CM | POA: Diagnosis not present

## 2020-12-07 DIAGNOSIS — Z9181 History of falling: Secondary | ICD-10-CM | POA: Diagnosis not present

## 2020-12-07 DIAGNOSIS — D696 Thrombocytopenia, unspecified: Secondary | ICD-10-CM | POA: Diagnosis not present

## 2020-12-07 DIAGNOSIS — Z7951 Long term (current) use of inhaled steroids: Secondary | ICD-10-CM | POA: Diagnosis not present

## 2020-12-11 DIAGNOSIS — Z9981 Dependence on supplemental oxygen: Secondary | ICD-10-CM | POA: Diagnosis not present

## 2020-12-11 DIAGNOSIS — M5440 Lumbago with sciatica, unspecified side: Secondary | ICD-10-CM | POA: Diagnosis not present

## 2020-12-11 DIAGNOSIS — D696 Thrombocytopenia, unspecified: Secondary | ICD-10-CM | POA: Diagnosis not present

## 2020-12-11 DIAGNOSIS — H409 Unspecified glaucoma: Secondary | ICD-10-CM | POA: Diagnosis not present

## 2020-12-11 DIAGNOSIS — F419 Anxiety disorder, unspecified: Secondary | ICD-10-CM | POA: Diagnosis not present

## 2020-12-11 DIAGNOSIS — Z7951 Long term (current) use of inhaled steroids: Secondary | ICD-10-CM | POA: Diagnosis not present

## 2020-12-11 DIAGNOSIS — Z6833 Body mass index (BMI) 33.0-33.9, adult: Secondary | ICD-10-CM | POA: Diagnosis not present

## 2020-12-11 DIAGNOSIS — J449 Chronic obstructive pulmonary disease, unspecified: Secondary | ICD-10-CM | POA: Diagnosis not present

## 2020-12-11 DIAGNOSIS — Z9181 History of falling: Secondary | ICD-10-CM | POA: Diagnosis not present

## 2020-12-11 DIAGNOSIS — Z79891 Long term (current) use of opiate analgesic: Secondary | ICD-10-CM | POA: Diagnosis not present

## 2020-12-11 DIAGNOSIS — E538 Deficiency of other specified B group vitamins: Secondary | ICD-10-CM | POA: Diagnosis not present

## 2020-12-11 DIAGNOSIS — E559 Vitamin D deficiency, unspecified: Secondary | ICD-10-CM | POA: Diagnosis not present

## 2020-12-11 DIAGNOSIS — E669 Obesity, unspecified: Secondary | ICD-10-CM | POA: Diagnosis not present

## 2020-12-11 DIAGNOSIS — I10 Essential (primary) hypertension: Secondary | ICD-10-CM | POA: Diagnosis not present

## 2020-12-11 DIAGNOSIS — N401 Enlarged prostate with lower urinary tract symptoms: Secondary | ICD-10-CM | POA: Diagnosis not present

## 2020-12-11 DIAGNOSIS — Z7902 Long term (current) use of antithrombotics/antiplatelets: Secondary | ICD-10-CM | POA: Diagnosis not present

## 2020-12-11 DIAGNOSIS — R296 Repeated falls: Secondary | ICD-10-CM | POA: Diagnosis not present

## 2020-12-11 DIAGNOSIS — R338 Other retention of urine: Secondary | ICD-10-CM | POA: Diagnosis not present

## 2020-12-11 DIAGNOSIS — I2510 Atherosclerotic heart disease of native coronary artery without angina pectoris: Secondary | ICD-10-CM | POA: Diagnosis not present

## 2020-12-13 DIAGNOSIS — H409 Unspecified glaucoma: Secondary | ICD-10-CM | POA: Diagnosis not present

## 2020-12-13 DIAGNOSIS — I2510 Atherosclerotic heart disease of native coronary artery without angina pectoris: Secondary | ICD-10-CM | POA: Diagnosis not present

## 2020-12-13 DIAGNOSIS — Z9181 History of falling: Secondary | ICD-10-CM | POA: Diagnosis not present

## 2020-12-13 DIAGNOSIS — N401 Enlarged prostate with lower urinary tract symptoms: Secondary | ICD-10-CM | POA: Diagnosis not present

## 2020-12-13 DIAGNOSIS — E538 Deficiency of other specified B group vitamins: Secondary | ICD-10-CM | POA: Diagnosis not present

## 2020-12-13 DIAGNOSIS — E669 Obesity, unspecified: Secondary | ICD-10-CM | POA: Diagnosis not present

## 2020-12-13 DIAGNOSIS — Z7902 Long term (current) use of antithrombotics/antiplatelets: Secondary | ICD-10-CM | POA: Diagnosis not present

## 2020-12-13 DIAGNOSIS — D696 Thrombocytopenia, unspecified: Secondary | ICD-10-CM | POA: Diagnosis not present

## 2020-12-13 DIAGNOSIS — F419 Anxiety disorder, unspecified: Secondary | ICD-10-CM | POA: Diagnosis not present

## 2020-12-13 DIAGNOSIS — R296 Repeated falls: Secondary | ICD-10-CM | POA: Diagnosis not present

## 2020-12-13 DIAGNOSIS — Z9981 Dependence on supplemental oxygen: Secondary | ICD-10-CM | POA: Diagnosis not present

## 2020-12-13 DIAGNOSIS — Z7951 Long term (current) use of inhaled steroids: Secondary | ICD-10-CM | POA: Diagnosis not present

## 2020-12-13 DIAGNOSIS — I10 Essential (primary) hypertension: Secondary | ICD-10-CM | POA: Diagnosis not present

## 2020-12-13 DIAGNOSIS — J449 Chronic obstructive pulmonary disease, unspecified: Secondary | ICD-10-CM | POA: Diagnosis not present

## 2020-12-13 DIAGNOSIS — M5440 Lumbago with sciatica, unspecified side: Secondary | ICD-10-CM | POA: Diagnosis not present

## 2020-12-13 DIAGNOSIS — Z79891 Long term (current) use of opiate analgesic: Secondary | ICD-10-CM | POA: Diagnosis not present

## 2020-12-13 DIAGNOSIS — R338 Other retention of urine: Secondary | ICD-10-CM | POA: Diagnosis not present

## 2020-12-13 DIAGNOSIS — E559 Vitamin D deficiency, unspecified: Secondary | ICD-10-CM | POA: Diagnosis not present

## 2020-12-13 DIAGNOSIS — Z6833 Body mass index (BMI) 33.0-33.9, adult: Secondary | ICD-10-CM | POA: Diagnosis not present

## 2020-12-14 DIAGNOSIS — Z7902 Long term (current) use of antithrombotics/antiplatelets: Secondary | ICD-10-CM | POA: Diagnosis not present

## 2020-12-14 DIAGNOSIS — R296 Repeated falls: Secondary | ICD-10-CM | POA: Diagnosis not present

## 2020-12-14 DIAGNOSIS — E669 Obesity, unspecified: Secondary | ICD-10-CM | POA: Diagnosis not present

## 2020-12-14 DIAGNOSIS — I2510 Atherosclerotic heart disease of native coronary artery without angina pectoris: Secondary | ICD-10-CM | POA: Diagnosis not present

## 2020-12-14 DIAGNOSIS — R338 Other retention of urine: Secondary | ICD-10-CM | POA: Diagnosis not present

## 2020-12-14 DIAGNOSIS — J449 Chronic obstructive pulmonary disease, unspecified: Secondary | ICD-10-CM | POA: Diagnosis not present

## 2020-12-14 DIAGNOSIS — I10 Essential (primary) hypertension: Secondary | ICD-10-CM | POA: Diagnosis not present

## 2020-12-14 DIAGNOSIS — E559 Vitamin D deficiency, unspecified: Secondary | ICD-10-CM | POA: Diagnosis not present

## 2020-12-14 DIAGNOSIS — H409 Unspecified glaucoma: Secondary | ICD-10-CM | POA: Diagnosis not present

## 2020-12-14 DIAGNOSIS — F419 Anxiety disorder, unspecified: Secondary | ICD-10-CM | POA: Diagnosis not present

## 2020-12-14 DIAGNOSIS — M5440 Lumbago with sciatica, unspecified side: Secondary | ICD-10-CM | POA: Diagnosis not present

## 2020-12-14 DIAGNOSIS — Z7951 Long term (current) use of inhaled steroids: Secondary | ICD-10-CM | POA: Diagnosis not present

## 2020-12-14 DIAGNOSIS — D696 Thrombocytopenia, unspecified: Secondary | ICD-10-CM | POA: Diagnosis not present

## 2020-12-14 DIAGNOSIS — Z79891 Long term (current) use of opiate analgesic: Secondary | ICD-10-CM | POA: Diagnosis not present

## 2020-12-14 DIAGNOSIS — Z9181 History of falling: Secondary | ICD-10-CM | POA: Diagnosis not present

## 2020-12-14 DIAGNOSIS — Z9981 Dependence on supplemental oxygen: Secondary | ICD-10-CM | POA: Diagnosis not present

## 2020-12-14 DIAGNOSIS — Z6833 Body mass index (BMI) 33.0-33.9, adult: Secondary | ICD-10-CM | POA: Diagnosis not present

## 2020-12-14 DIAGNOSIS — N401 Enlarged prostate with lower urinary tract symptoms: Secondary | ICD-10-CM | POA: Diagnosis not present

## 2020-12-14 DIAGNOSIS — E538 Deficiency of other specified B group vitamins: Secondary | ICD-10-CM | POA: Diagnosis not present

## 2020-12-18 DIAGNOSIS — Z9981 Dependence on supplemental oxygen: Secondary | ICD-10-CM | POA: Diagnosis not present

## 2020-12-18 DIAGNOSIS — R338 Other retention of urine: Secondary | ICD-10-CM | POA: Diagnosis not present

## 2020-12-18 DIAGNOSIS — Z7902 Long term (current) use of antithrombotics/antiplatelets: Secondary | ICD-10-CM | POA: Diagnosis not present

## 2020-12-18 DIAGNOSIS — D696 Thrombocytopenia, unspecified: Secondary | ICD-10-CM | POA: Diagnosis not present

## 2020-12-18 DIAGNOSIS — E559 Vitamin D deficiency, unspecified: Secondary | ICD-10-CM | POA: Diagnosis not present

## 2020-12-18 DIAGNOSIS — Z7951 Long term (current) use of inhaled steroids: Secondary | ICD-10-CM | POA: Diagnosis not present

## 2020-12-18 DIAGNOSIS — E538 Deficiency of other specified B group vitamins: Secondary | ICD-10-CM | POA: Diagnosis not present

## 2020-12-18 DIAGNOSIS — I2510 Atherosclerotic heart disease of native coronary artery without angina pectoris: Secondary | ICD-10-CM | POA: Diagnosis not present

## 2020-12-18 DIAGNOSIS — N401 Enlarged prostate with lower urinary tract symptoms: Secondary | ICD-10-CM | POA: Diagnosis not present

## 2020-12-18 DIAGNOSIS — F419 Anxiety disorder, unspecified: Secondary | ICD-10-CM | POA: Diagnosis not present

## 2020-12-18 DIAGNOSIS — J449 Chronic obstructive pulmonary disease, unspecified: Secondary | ICD-10-CM | POA: Diagnosis not present

## 2020-12-18 DIAGNOSIS — Z6833 Body mass index (BMI) 33.0-33.9, adult: Secondary | ICD-10-CM | POA: Diagnosis not present

## 2020-12-18 DIAGNOSIS — H409 Unspecified glaucoma: Secondary | ICD-10-CM | POA: Diagnosis not present

## 2020-12-18 DIAGNOSIS — R296 Repeated falls: Secondary | ICD-10-CM | POA: Diagnosis not present

## 2020-12-18 DIAGNOSIS — Z9181 History of falling: Secondary | ICD-10-CM | POA: Diagnosis not present

## 2020-12-18 DIAGNOSIS — Z79891 Long term (current) use of opiate analgesic: Secondary | ICD-10-CM | POA: Diagnosis not present

## 2020-12-18 DIAGNOSIS — I10 Essential (primary) hypertension: Secondary | ICD-10-CM | POA: Diagnosis not present

## 2020-12-18 DIAGNOSIS — M5440 Lumbago with sciatica, unspecified side: Secondary | ICD-10-CM | POA: Diagnosis not present

## 2020-12-18 DIAGNOSIS — E669 Obesity, unspecified: Secondary | ICD-10-CM | POA: Diagnosis not present

## 2020-12-19 DIAGNOSIS — Z9981 Dependence on supplemental oxygen: Secondary | ICD-10-CM | POA: Diagnosis not present

## 2020-12-19 DIAGNOSIS — R296 Repeated falls: Secondary | ICD-10-CM | POA: Diagnosis not present

## 2020-12-19 DIAGNOSIS — H409 Unspecified glaucoma: Secondary | ICD-10-CM | POA: Diagnosis not present

## 2020-12-19 DIAGNOSIS — R338 Other retention of urine: Secondary | ICD-10-CM | POA: Diagnosis not present

## 2020-12-19 DIAGNOSIS — Z7951 Long term (current) use of inhaled steroids: Secondary | ICD-10-CM | POA: Diagnosis not present

## 2020-12-19 DIAGNOSIS — Z9181 History of falling: Secondary | ICD-10-CM | POA: Diagnosis not present

## 2020-12-19 DIAGNOSIS — N401 Enlarged prostate with lower urinary tract symptoms: Secondary | ICD-10-CM | POA: Diagnosis not present

## 2020-12-19 DIAGNOSIS — M5440 Lumbago with sciatica, unspecified side: Secondary | ICD-10-CM | POA: Diagnosis not present

## 2020-12-19 DIAGNOSIS — I2510 Atherosclerotic heart disease of native coronary artery without angina pectoris: Secondary | ICD-10-CM | POA: Diagnosis not present

## 2020-12-19 DIAGNOSIS — D696 Thrombocytopenia, unspecified: Secondary | ICD-10-CM | POA: Diagnosis not present

## 2020-12-19 DIAGNOSIS — Z7902 Long term (current) use of antithrombotics/antiplatelets: Secondary | ICD-10-CM | POA: Diagnosis not present

## 2020-12-19 DIAGNOSIS — J449 Chronic obstructive pulmonary disease, unspecified: Secondary | ICD-10-CM | POA: Diagnosis not present

## 2020-12-19 DIAGNOSIS — Z79891 Long term (current) use of opiate analgesic: Secondary | ICD-10-CM | POA: Diagnosis not present

## 2020-12-19 DIAGNOSIS — E559 Vitamin D deficiency, unspecified: Secondary | ICD-10-CM | POA: Diagnosis not present

## 2020-12-19 DIAGNOSIS — E538 Deficiency of other specified B group vitamins: Secondary | ICD-10-CM | POA: Diagnosis not present

## 2020-12-19 DIAGNOSIS — E669 Obesity, unspecified: Secondary | ICD-10-CM | POA: Diagnosis not present

## 2020-12-19 DIAGNOSIS — I10 Essential (primary) hypertension: Secondary | ICD-10-CM | POA: Diagnosis not present

## 2020-12-19 DIAGNOSIS — Z6833 Body mass index (BMI) 33.0-33.9, adult: Secondary | ICD-10-CM | POA: Diagnosis not present

## 2020-12-19 DIAGNOSIS — F419 Anxiety disorder, unspecified: Secondary | ICD-10-CM | POA: Diagnosis not present

## 2020-12-21 DIAGNOSIS — Z9981 Dependence on supplemental oxygen: Secondary | ICD-10-CM | POA: Diagnosis not present

## 2020-12-21 DIAGNOSIS — E538 Deficiency of other specified B group vitamins: Secondary | ICD-10-CM | POA: Diagnosis not present

## 2020-12-21 DIAGNOSIS — I2510 Atherosclerotic heart disease of native coronary artery without angina pectoris: Secondary | ICD-10-CM | POA: Diagnosis not present

## 2020-12-21 DIAGNOSIS — Z9181 History of falling: Secondary | ICD-10-CM | POA: Diagnosis not present

## 2020-12-21 DIAGNOSIS — E559 Vitamin D deficiency, unspecified: Secondary | ICD-10-CM | POA: Diagnosis not present

## 2020-12-21 DIAGNOSIS — M5440 Lumbago with sciatica, unspecified side: Secondary | ICD-10-CM | POA: Diagnosis not present

## 2020-12-21 DIAGNOSIS — R296 Repeated falls: Secondary | ICD-10-CM | POA: Diagnosis not present

## 2020-12-21 DIAGNOSIS — Z79891 Long term (current) use of opiate analgesic: Secondary | ICD-10-CM | POA: Diagnosis not present

## 2020-12-21 DIAGNOSIS — Z7951 Long term (current) use of inhaled steroids: Secondary | ICD-10-CM | POA: Diagnosis not present

## 2020-12-21 DIAGNOSIS — D696 Thrombocytopenia, unspecified: Secondary | ICD-10-CM | POA: Diagnosis not present

## 2020-12-21 DIAGNOSIS — R338 Other retention of urine: Secondary | ICD-10-CM | POA: Diagnosis not present

## 2020-12-21 DIAGNOSIS — E669 Obesity, unspecified: Secondary | ICD-10-CM | POA: Diagnosis not present

## 2020-12-21 DIAGNOSIS — H409 Unspecified glaucoma: Secondary | ICD-10-CM | POA: Diagnosis not present

## 2020-12-21 DIAGNOSIS — J449 Chronic obstructive pulmonary disease, unspecified: Secondary | ICD-10-CM | POA: Diagnosis not present

## 2020-12-21 DIAGNOSIS — Z6833 Body mass index (BMI) 33.0-33.9, adult: Secondary | ICD-10-CM | POA: Diagnosis not present

## 2020-12-21 DIAGNOSIS — Z7902 Long term (current) use of antithrombotics/antiplatelets: Secondary | ICD-10-CM | POA: Diagnosis not present

## 2020-12-21 DIAGNOSIS — F419 Anxiety disorder, unspecified: Secondary | ICD-10-CM | POA: Diagnosis not present

## 2020-12-21 DIAGNOSIS — N401 Enlarged prostate with lower urinary tract symptoms: Secondary | ICD-10-CM | POA: Diagnosis not present

## 2020-12-21 DIAGNOSIS — I10 Essential (primary) hypertension: Secondary | ICD-10-CM | POA: Diagnosis not present

## 2020-12-22 DIAGNOSIS — N401 Enlarged prostate with lower urinary tract symptoms: Secondary | ICD-10-CM | POA: Diagnosis not present

## 2020-12-22 DIAGNOSIS — E538 Deficiency of other specified B group vitamins: Secondary | ICD-10-CM | POA: Diagnosis not present

## 2020-12-22 DIAGNOSIS — E559 Vitamin D deficiency, unspecified: Secondary | ICD-10-CM | POA: Diagnosis not present

## 2020-12-22 DIAGNOSIS — J449 Chronic obstructive pulmonary disease, unspecified: Secondary | ICD-10-CM | POA: Diagnosis not present

## 2020-12-22 DIAGNOSIS — I2510 Atherosclerotic heart disease of native coronary artery without angina pectoris: Secondary | ICD-10-CM | POA: Diagnosis not present

## 2020-12-22 DIAGNOSIS — R338 Other retention of urine: Secondary | ICD-10-CM | POA: Diagnosis not present

## 2020-12-22 DIAGNOSIS — Z6833 Body mass index (BMI) 33.0-33.9, adult: Secondary | ICD-10-CM | POA: Diagnosis not present

## 2020-12-22 DIAGNOSIS — I10 Essential (primary) hypertension: Secondary | ICD-10-CM | POA: Diagnosis not present

## 2020-12-22 DIAGNOSIS — H409 Unspecified glaucoma: Secondary | ICD-10-CM | POA: Diagnosis not present

## 2020-12-22 DIAGNOSIS — E669 Obesity, unspecified: Secondary | ICD-10-CM | POA: Diagnosis not present

## 2020-12-22 DIAGNOSIS — M5440 Lumbago with sciatica, unspecified side: Secondary | ICD-10-CM | POA: Diagnosis not present

## 2020-12-22 DIAGNOSIS — R296 Repeated falls: Secondary | ICD-10-CM | POA: Diagnosis not present

## 2020-12-22 DIAGNOSIS — Z7902 Long term (current) use of antithrombotics/antiplatelets: Secondary | ICD-10-CM | POA: Diagnosis not present

## 2020-12-22 DIAGNOSIS — F419 Anxiety disorder, unspecified: Secondary | ICD-10-CM | POA: Diagnosis not present

## 2020-12-22 DIAGNOSIS — Z79891 Long term (current) use of opiate analgesic: Secondary | ICD-10-CM | POA: Diagnosis not present

## 2020-12-22 DIAGNOSIS — D696 Thrombocytopenia, unspecified: Secondary | ICD-10-CM | POA: Diagnosis not present

## 2020-12-22 DIAGNOSIS — Z7951 Long term (current) use of inhaled steroids: Secondary | ICD-10-CM | POA: Diagnosis not present

## 2020-12-22 DIAGNOSIS — Z9981 Dependence on supplemental oxygen: Secondary | ICD-10-CM | POA: Diagnosis not present

## 2020-12-22 DIAGNOSIS — Z9181 History of falling: Secondary | ICD-10-CM | POA: Diagnosis not present

## 2020-12-26 DIAGNOSIS — I2510 Atherosclerotic heart disease of native coronary artery without angina pectoris: Secondary | ICD-10-CM | POA: Diagnosis not present

## 2020-12-26 DIAGNOSIS — E669 Obesity, unspecified: Secondary | ICD-10-CM | POA: Diagnosis not present

## 2020-12-26 DIAGNOSIS — Z9981 Dependence on supplemental oxygen: Secondary | ICD-10-CM | POA: Diagnosis not present

## 2020-12-26 DIAGNOSIS — Z7951 Long term (current) use of inhaled steroids: Secondary | ICD-10-CM | POA: Diagnosis not present

## 2020-12-26 DIAGNOSIS — D696 Thrombocytopenia, unspecified: Secondary | ICD-10-CM | POA: Diagnosis not present

## 2020-12-26 DIAGNOSIS — I10 Essential (primary) hypertension: Secondary | ICD-10-CM | POA: Diagnosis not present

## 2020-12-26 DIAGNOSIS — N401 Enlarged prostate with lower urinary tract symptoms: Secondary | ICD-10-CM | POA: Diagnosis not present

## 2020-12-26 DIAGNOSIS — F419 Anxiety disorder, unspecified: Secondary | ICD-10-CM | POA: Diagnosis not present

## 2020-12-26 DIAGNOSIS — Z79891 Long term (current) use of opiate analgesic: Secondary | ICD-10-CM | POA: Diagnosis not present

## 2020-12-26 DIAGNOSIS — H409 Unspecified glaucoma: Secondary | ICD-10-CM | POA: Diagnosis not present

## 2020-12-26 DIAGNOSIS — E559 Vitamin D deficiency, unspecified: Secondary | ICD-10-CM | POA: Diagnosis not present

## 2020-12-26 DIAGNOSIS — Z7902 Long term (current) use of antithrombotics/antiplatelets: Secondary | ICD-10-CM | POA: Diagnosis not present

## 2020-12-26 DIAGNOSIS — J449 Chronic obstructive pulmonary disease, unspecified: Secondary | ICD-10-CM | POA: Diagnosis not present

## 2020-12-26 DIAGNOSIS — Z6833 Body mass index (BMI) 33.0-33.9, adult: Secondary | ICD-10-CM | POA: Diagnosis not present

## 2020-12-26 DIAGNOSIS — R338 Other retention of urine: Secondary | ICD-10-CM | POA: Diagnosis not present

## 2020-12-26 DIAGNOSIS — Z9181 History of falling: Secondary | ICD-10-CM | POA: Diagnosis not present

## 2020-12-26 DIAGNOSIS — M5440 Lumbago with sciatica, unspecified side: Secondary | ICD-10-CM | POA: Diagnosis not present

## 2020-12-26 DIAGNOSIS — R296 Repeated falls: Secondary | ICD-10-CM | POA: Diagnosis not present

## 2020-12-26 DIAGNOSIS — E538 Deficiency of other specified B group vitamins: Secondary | ICD-10-CM | POA: Diagnosis not present

## 2020-12-27 ENCOUNTER — Ambulatory Visit: Payer: PPO

## 2020-12-27 DIAGNOSIS — N3941 Urge incontinence: Secondary | ICD-10-CM | POA: Diagnosis not present

## 2020-12-27 DIAGNOSIS — R600 Localized edema: Secondary | ICD-10-CM | POA: Diagnosis not present

## 2020-12-27 DIAGNOSIS — I10 Essential (primary) hypertension: Secondary | ICD-10-CM | POA: Diagnosis not present

## 2020-12-29 DIAGNOSIS — R296 Repeated falls: Secondary | ICD-10-CM | POA: Diagnosis not present

## 2020-12-29 DIAGNOSIS — J449 Chronic obstructive pulmonary disease, unspecified: Secondary | ICD-10-CM | POA: Diagnosis not present

## 2020-12-29 DIAGNOSIS — I2510 Atherosclerotic heart disease of native coronary artery without angina pectoris: Secondary | ICD-10-CM | POA: Diagnosis not present

## 2020-12-29 DIAGNOSIS — E559 Vitamin D deficiency, unspecified: Secondary | ICD-10-CM | POA: Diagnosis not present

## 2020-12-29 DIAGNOSIS — F419 Anxiety disorder, unspecified: Secondary | ICD-10-CM | POA: Diagnosis not present

## 2020-12-29 DIAGNOSIS — Z9981 Dependence on supplemental oxygen: Secondary | ICD-10-CM | POA: Diagnosis not present

## 2020-12-29 DIAGNOSIS — E538 Deficiency of other specified B group vitamins: Secondary | ICD-10-CM | POA: Diagnosis not present

## 2020-12-29 DIAGNOSIS — E669 Obesity, unspecified: Secondary | ICD-10-CM | POA: Diagnosis not present

## 2020-12-29 DIAGNOSIS — Z7902 Long term (current) use of antithrombotics/antiplatelets: Secondary | ICD-10-CM | POA: Diagnosis not present

## 2020-12-29 DIAGNOSIS — Z9181 History of falling: Secondary | ICD-10-CM | POA: Diagnosis not present

## 2020-12-29 DIAGNOSIS — Z7951 Long term (current) use of inhaled steroids: Secondary | ICD-10-CM | POA: Diagnosis not present

## 2020-12-29 DIAGNOSIS — M5440 Lumbago with sciatica, unspecified side: Secondary | ICD-10-CM | POA: Diagnosis not present

## 2020-12-29 DIAGNOSIS — H409 Unspecified glaucoma: Secondary | ICD-10-CM | POA: Diagnosis not present

## 2020-12-29 DIAGNOSIS — D696 Thrombocytopenia, unspecified: Secondary | ICD-10-CM | POA: Diagnosis not present

## 2020-12-29 DIAGNOSIS — R338 Other retention of urine: Secondary | ICD-10-CM | POA: Diagnosis not present

## 2020-12-29 DIAGNOSIS — Z6833 Body mass index (BMI) 33.0-33.9, adult: Secondary | ICD-10-CM | POA: Diagnosis not present

## 2020-12-29 DIAGNOSIS — N401 Enlarged prostate with lower urinary tract symptoms: Secondary | ICD-10-CM | POA: Diagnosis not present

## 2020-12-29 DIAGNOSIS — I10 Essential (primary) hypertension: Secondary | ICD-10-CM | POA: Diagnosis not present

## 2020-12-29 DIAGNOSIS — Z79891 Long term (current) use of opiate analgesic: Secondary | ICD-10-CM | POA: Diagnosis not present

## 2021-01-01 DIAGNOSIS — Z981 Arthrodesis status: Secondary | ICD-10-CM | POA: Diagnosis not present

## 2021-01-01 DIAGNOSIS — M546 Pain in thoracic spine: Secondary | ICD-10-CM | POA: Diagnosis not present

## 2021-01-01 DIAGNOSIS — M4324 Fusion of spine, thoracic region: Secondary | ICD-10-CM | POA: Diagnosis not present

## 2021-01-02 DIAGNOSIS — E538 Deficiency of other specified B group vitamins: Secondary | ICD-10-CM | POA: Diagnosis not present

## 2021-01-02 DIAGNOSIS — N401 Enlarged prostate with lower urinary tract symptoms: Secondary | ICD-10-CM | POA: Diagnosis not present

## 2021-01-02 DIAGNOSIS — Z9981 Dependence on supplemental oxygen: Secondary | ICD-10-CM | POA: Diagnosis not present

## 2021-01-02 DIAGNOSIS — D696 Thrombocytopenia, unspecified: Secondary | ICD-10-CM | POA: Diagnosis not present

## 2021-01-02 DIAGNOSIS — E669 Obesity, unspecified: Secondary | ICD-10-CM | POA: Diagnosis not present

## 2021-01-02 DIAGNOSIS — E559 Vitamin D deficiency, unspecified: Secondary | ICD-10-CM | POA: Diagnosis not present

## 2021-01-02 DIAGNOSIS — I2510 Atherosclerotic heart disease of native coronary artery without angina pectoris: Secondary | ICD-10-CM | POA: Diagnosis not present

## 2021-01-02 DIAGNOSIS — H409 Unspecified glaucoma: Secondary | ICD-10-CM | POA: Diagnosis not present

## 2021-01-02 DIAGNOSIS — M5440 Lumbago with sciatica, unspecified side: Secondary | ICD-10-CM | POA: Diagnosis not present

## 2021-01-02 DIAGNOSIS — Z79891 Long term (current) use of opiate analgesic: Secondary | ICD-10-CM | POA: Diagnosis not present

## 2021-01-02 DIAGNOSIS — R296 Repeated falls: Secondary | ICD-10-CM | POA: Diagnosis not present

## 2021-01-02 DIAGNOSIS — Z6833 Body mass index (BMI) 33.0-33.9, adult: Secondary | ICD-10-CM | POA: Diagnosis not present

## 2021-01-02 DIAGNOSIS — J449 Chronic obstructive pulmonary disease, unspecified: Secondary | ICD-10-CM | POA: Diagnosis not present

## 2021-01-02 DIAGNOSIS — Z9181 History of falling: Secondary | ICD-10-CM | POA: Diagnosis not present

## 2021-01-02 DIAGNOSIS — F419 Anxiety disorder, unspecified: Secondary | ICD-10-CM | POA: Diagnosis not present

## 2021-01-02 DIAGNOSIS — Z7951 Long term (current) use of inhaled steroids: Secondary | ICD-10-CM | POA: Diagnosis not present

## 2021-01-02 DIAGNOSIS — Z7902 Long term (current) use of antithrombotics/antiplatelets: Secondary | ICD-10-CM | POA: Diagnosis not present

## 2021-01-02 DIAGNOSIS — R338 Other retention of urine: Secondary | ICD-10-CM | POA: Diagnosis not present

## 2021-01-02 DIAGNOSIS — I10 Essential (primary) hypertension: Secondary | ICD-10-CM | POA: Diagnosis not present

## 2021-01-04 DIAGNOSIS — E538 Deficiency of other specified B group vitamins: Secondary | ICD-10-CM | POA: Diagnosis not present

## 2021-01-04 DIAGNOSIS — F419 Anxiety disorder, unspecified: Secondary | ICD-10-CM | POA: Diagnosis not present

## 2021-01-04 DIAGNOSIS — H409 Unspecified glaucoma: Secondary | ICD-10-CM | POA: Diagnosis not present

## 2021-01-04 DIAGNOSIS — Z9981 Dependence on supplemental oxygen: Secondary | ICD-10-CM | POA: Diagnosis not present

## 2021-01-04 DIAGNOSIS — Z9181 History of falling: Secondary | ICD-10-CM | POA: Diagnosis not present

## 2021-01-04 DIAGNOSIS — Z7951 Long term (current) use of inhaled steroids: Secondary | ICD-10-CM | POA: Diagnosis not present

## 2021-01-04 DIAGNOSIS — I10 Essential (primary) hypertension: Secondary | ICD-10-CM | POA: Diagnosis not present

## 2021-01-04 DIAGNOSIS — E669 Obesity, unspecified: Secondary | ICD-10-CM | POA: Diagnosis not present

## 2021-01-04 DIAGNOSIS — M5440 Lumbago with sciatica, unspecified side: Secondary | ICD-10-CM | POA: Diagnosis not present

## 2021-01-04 DIAGNOSIS — Z6833 Body mass index (BMI) 33.0-33.9, adult: Secondary | ICD-10-CM | POA: Diagnosis not present

## 2021-01-04 DIAGNOSIS — I2510 Atherosclerotic heart disease of native coronary artery without angina pectoris: Secondary | ICD-10-CM | POA: Diagnosis not present

## 2021-01-04 DIAGNOSIS — R296 Repeated falls: Secondary | ICD-10-CM | POA: Diagnosis not present

## 2021-01-04 DIAGNOSIS — N401 Enlarged prostate with lower urinary tract symptoms: Secondary | ICD-10-CM | POA: Diagnosis not present

## 2021-01-04 DIAGNOSIS — J449 Chronic obstructive pulmonary disease, unspecified: Secondary | ICD-10-CM | POA: Diagnosis not present

## 2021-01-04 DIAGNOSIS — Z7902 Long term (current) use of antithrombotics/antiplatelets: Secondary | ICD-10-CM | POA: Diagnosis not present

## 2021-01-04 DIAGNOSIS — E559 Vitamin D deficiency, unspecified: Secondary | ICD-10-CM | POA: Diagnosis not present

## 2021-01-04 DIAGNOSIS — R338 Other retention of urine: Secondary | ICD-10-CM | POA: Diagnosis not present

## 2021-01-04 DIAGNOSIS — D696 Thrombocytopenia, unspecified: Secondary | ICD-10-CM | POA: Diagnosis not present

## 2021-01-04 DIAGNOSIS — Z79891 Long term (current) use of opiate analgesic: Secondary | ICD-10-CM | POA: Diagnosis not present

## 2021-01-08 DIAGNOSIS — R296 Repeated falls: Secondary | ICD-10-CM | POA: Diagnosis not present

## 2021-01-08 DIAGNOSIS — F419 Anxiety disorder, unspecified: Secondary | ICD-10-CM | POA: Diagnosis not present

## 2021-01-08 DIAGNOSIS — H409 Unspecified glaucoma: Secondary | ICD-10-CM | POA: Diagnosis not present

## 2021-01-08 DIAGNOSIS — Z9181 History of falling: Secondary | ICD-10-CM | POA: Diagnosis not present

## 2021-01-08 DIAGNOSIS — Z79891 Long term (current) use of opiate analgesic: Secondary | ICD-10-CM | POA: Diagnosis not present

## 2021-01-08 DIAGNOSIS — M5440 Lumbago with sciatica, unspecified side: Secondary | ICD-10-CM | POA: Diagnosis not present

## 2021-01-08 DIAGNOSIS — N401 Enlarged prostate with lower urinary tract symptoms: Secondary | ICD-10-CM | POA: Diagnosis not present

## 2021-01-08 DIAGNOSIS — Z7951 Long term (current) use of inhaled steroids: Secondary | ICD-10-CM | POA: Diagnosis not present

## 2021-01-08 DIAGNOSIS — E559 Vitamin D deficiency, unspecified: Secondary | ICD-10-CM | POA: Diagnosis not present

## 2021-01-08 DIAGNOSIS — I2510 Atherosclerotic heart disease of native coronary artery without angina pectoris: Secondary | ICD-10-CM | POA: Diagnosis not present

## 2021-01-08 DIAGNOSIS — D696 Thrombocytopenia, unspecified: Secondary | ICD-10-CM | POA: Diagnosis not present

## 2021-01-08 DIAGNOSIS — Z7902 Long term (current) use of antithrombotics/antiplatelets: Secondary | ICD-10-CM | POA: Diagnosis not present

## 2021-01-08 DIAGNOSIS — R338 Other retention of urine: Secondary | ICD-10-CM | POA: Diagnosis not present

## 2021-01-08 DIAGNOSIS — E669 Obesity, unspecified: Secondary | ICD-10-CM | POA: Diagnosis not present

## 2021-01-08 DIAGNOSIS — E538 Deficiency of other specified B group vitamins: Secondary | ICD-10-CM | POA: Diagnosis not present

## 2021-01-08 DIAGNOSIS — J449 Chronic obstructive pulmonary disease, unspecified: Secondary | ICD-10-CM | POA: Diagnosis not present

## 2021-01-08 DIAGNOSIS — Z6833 Body mass index (BMI) 33.0-33.9, adult: Secondary | ICD-10-CM | POA: Diagnosis not present

## 2021-01-08 DIAGNOSIS — Z9981 Dependence on supplemental oxygen: Secondary | ICD-10-CM | POA: Diagnosis not present

## 2021-01-08 DIAGNOSIS — I10 Essential (primary) hypertension: Secondary | ICD-10-CM | POA: Diagnosis not present

## 2021-01-11 DIAGNOSIS — E669 Obesity, unspecified: Secondary | ICD-10-CM | POA: Diagnosis not present

## 2021-01-11 DIAGNOSIS — Z7951 Long term (current) use of inhaled steroids: Secondary | ICD-10-CM | POA: Diagnosis not present

## 2021-01-11 DIAGNOSIS — Z79891 Long term (current) use of opiate analgesic: Secondary | ICD-10-CM | POA: Diagnosis not present

## 2021-01-11 DIAGNOSIS — R296 Repeated falls: Secondary | ICD-10-CM | POA: Diagnosis not present

## 2021-01-11 DIAGNOSIS — N401 Enlarged prostate with lower urinary tract symptoms: Secondary | ICD-10-CM | POA: Diagnosis not present

## 2021-01-11 DIAGNOSIS — Z9981 Dependence on supplemental oxygen: Secondary | ICD-10-CM | POA: Diagnosis not present

## 2021-01-11 DIAGNOSIS — Z7902 Long term (current) use of antithrombotics/antiplatelets: Secondary | ICD-10-CM | POA: Diagnosis not present

## 2021-01-11 DIAGNOSIS — J449 Chronic obstructive pulmonary disease, unspecified: Secondary | ICD-10-CM | POA: Diagnosis not present

## 2021-01-11 DIAGNOSIS — E538 Deficiency of other specified B group vitamins: Secondary | ICD-10-CM | POA: Diagnosis not present

## 2021-01-11 DIAGNOSIS — Z6833 Body mass index (BMI) 33.0-33.9, adult: Secondary | ICD-10-CM | POA: Diagnosis not present

## 2021-01-11 DIAGNOSIS — I10 Essential (primary) hypertension: Secondary | ICD-10-CM | POA: Diagnosis not present

## 2021-01-11 DIAGNOSIS — E559 Vitamin D deficiency, unspecified: Secondary | ICD-10-CM | POA: Diagnosis not present

## 2021-01-11 DIAGNOSIS — Z9181 History of falling: Secondary | ICD-10-CM | POA: Diagnosis not present

## 2021-01-11 DIAGNOSIS — M5440 Lumbago with sciatica, unspecified side: Secondary | ICD-10-CM | POA: Diagnosis not present

## 2021-01-11 DIAGNOSIS — R338 Other retention of urine: Secondary | ICD-10-CM | POA: Diagnosis not present

## 2021-01-11 DIAGNOSIS — H409 Unspecified glaucoma: Secondary | ICD-10-CM | POA: Diagnosis not present

## 2021-01-11 DIAGNOSIS — I2510 Atherosclerotic heart disease of native coronary artery without angina pectoris: Secondary | ICD-10-CM | POA: Diagnosis not present

## 2021-01-11 DIAGNOSIS — D696 Thrombocytopenia, unspecified: Secondary | ICD-10-CM | POA: Diagnosis not present

## 2021-01-11 DIAGNOSIS — F419 Anxiety disorder, unspecified: Secondary | ICD-10-CM | POA: Diagnosis not present

## 2021-01-12 DIAGNOSIS — J449 Chronic obstructive pulmonary disease, unspecified: Secondary | ICD-10-CM | POA: Diagnosis not present

## 2021-01-18 DIAGNOSIS — F419 Anxiety disorder, unspecified: Secondary | ICD-10-CM | POA: Diagnosis not present

## 2021-01-18 DIAGNOSIS — H409 Unspecified glaucoma: Secondary | ICD-10-CM | POA: Diagnosis not present

## 2021-01-18 DIAGNOSIS — Z7902 Long term (current) use of antithrombotics/antiplatelets: Secondary | ICD-10-CM | POA: Diagnosis not present

## 2021-01-18 DIAGNOSIS — J449 Chronic obstructive pulmonary disease, unspecified: Secondary | ICD-10-CM | POA: Diagnosis not present

## 2021-01-18 DIAGNOSIS — E559 Vitamin D deficiency, unspecified: Secondary | ICD-10-CM | POA: Diagnosis not present

## 2021-01-18 DIAGNOSIS — Z9181 History of falling: Secondary | ICD-10-CM | POA: Diagnosis not present

## 2021-01-18 DIAGNOSIS — Z9981 Dependence on supplemental oxygen: Secondary | ICD-10-CM | POA: Diagnosis not present

## 2021-01-18 DIAGNOSIS — R296 Repeated falls: Secondary | ICD-10-CM | POA: Diagnosis not present

## 2021-01-18 DIAGNOSIS — I2510 Atherosclerotic heart disease of native coronary artery without angina pectoris: Secondary | ICD-10-CM | POA: Diagnosis not present

## 2021-01-18 DIAGNOSIS — D696 Thrombocytopenia, unspecified: Secondary | ICD-10-CM | POA: Diagnosis not present

## 2021-01-18 DIAGNOSIS — N401 Enlarged prostate with lower urinary tract symptoms: Secondary | ICD-10-CM | POA: Diagnosis not present

## 2021-01-18 DIAGNOSIS — M5440 Lumbago with sciatica, unspecified side: Secondary | ICD-10-CM | POA: Diagnosis not present

## 2021-01-18 DIAGNOSIS — E538 Deficiency of other specified B group vitamins: Secondary | ICD-10-CM | POA: Diagnosis not present

## 2021-01-18 DIAGNOSIS — Z7951 Long term (current) use of inhaled steroids: Secondary | ICD-10-CM | POA: Diagnosis not present

## 2021-01-18 DIAGNOSIS — E669 Obesity, unspecified: Secondary | ICD-10-CM | POA: Diagnosis not present

## 2021-01-18 DIAGNOSIS — I10 Essential (primary) hypertension: Secondary | ICD-10-CM | POA: Diagnosis not present

## 2021-01-18 DIAGNOSIS — Z79891 Long term (current) use of opiate analgesic: Secondary | ICD-10-CM | POA: Diagnosis not present

## 2021-01-18 DIAGNOSIS — Z6833 Body mass index (BMI) 33.0-33.9, adult: Secondary | ICD-10-CM | POA: Diagnosis not present

## 2021-01-18 DIAGNOSIS — R338 Other retention of urine: Secondary | ICD-10-CM | POA: Diagnosis not present

## 2021-01-19 DIAGNOSIS — H401131 Primary open-angle glaucoma, bilateral, mild stage: Secondary | ICD-10-CM | POA: Diagnosis not present

## 2021-01-24 DIAGNOSIS — X32XXXA Exposure to sunlight, initial encounter: Secondary | ICD-10-CM | POA: Diagnosis not present

## 2021-01-24 DIAGNOSIS — D2261 Melanocytic nevi of right upper limb, including shoulder: Secondary | ICD-10-CM | POA: Diagnosis not present

## 2021-01-24 DIAGNOSIS — L570 Actinic keratosis: Secondary | ICD-10-CM | POA: Diagnosis not present

## 2021-01-24 DIAGNOSIS — L308 Other specified dermatitis: Secondary | ICD-10-CM | POA: Diagnosis not present

## 2021-01-24 DIAGNOSIS — Z85828 Personal history of other malignant neoplasm of skin: Secondary | ICD-10-CM | POA: Diagnosis not present

## 2021-01-24 DIAGNOSIS — D2271 Melanocytic nevi of right lower limb, including hip: Secondary | ICD-10-CM | POA: Diagnosis not present

## 2021-01-28 DIAGNOSIS — M4324 Fusion of spine, thoracic region: Secondary | ICD-10-CM | POA: Diagnosis not present

## 2021-02-01 DIAGNOSIS — J449 Chronic obstructive pulmonary disease, unspecified: Secondary | ICD-10-CM | POA: Diagnosis not present

## 2021-02-01 DIAGNOSIS — R0609 Other forms of dyspnea: Secondary | ICD-10-CM | POA: Diagnosis not present

## 2021-02-01 DIAGNOSIS — R600 Localized edema: Secondary | ICD-10-CM | POA: Diagnosis not present

## 2021-02-01 DIAGNOSIS — G4734 Idiopathic sleep related nonobstructive alveolar hypoventilation: Secondary | ICD-10-CM | POA: Diagnosis not present

## 2021-02-02 DIAGNOSIS — D696 Thrombocytopenia, unspecified: Secondary | ICD-10-CM | POA: Diagnosis not present

## 2021-02-02 DIAGNOSIS — S32019D Unspecified fracture of first lumbar vertebra, subsequent encounter for fracture with routine healing: Secondary | ICD-10-CM | POA: Diagnosis not present

## 2021-02-02 DIAGNOSIS — E785 Hyperlipidemia, unspecified: Secondary | ICD-10-CM | POA: Diagnosis not present

## 2021-02-02 DIAGNOSIS — I10 Essential (primary) hypertension: Secondary | ICD-10-CM | POA: Diagnosis not present

## 2021-02-02 DIAGNOSIS — R350 Frequency of micturition: Secondary | ICD-10-CM | POA: Diagnosis not present

## 2021-02-02 DIAGNOSIS — R338 Other retention of urine: Secondary | ICD-10-CM | POA: Diagnosis not present

## 2021-02-02 DIAGNOSIS — R296 Repeated falls: Secondary | ICD-10-CM | POA: Diagnosis not present

## 2021-02-02 DIAGNOSIS — I2510 Atherosclerotic heart disease of native coronary artery without angina pectoris: Secondary | ICD-10-CM | POA: Diagnosis not present

## 2021-02-02 DIAGNOSIS — F419 Anxiety disorder, unspecified: Secondary | ICD-10-CM | POA: Diagnosis not present

## 2021-02-02 DIAGNOSIS — R351 Nocturia: Secondary | ICD-10-CM | POA: Diagnosis not present

## 2021-02-02 DIAGNOSIS — M5440 Lumbago with sciatica, unspecified side: Secondary | ICD-10-CM | POA: Diagnosis not present

## 2021-02-02 DIAGNOSIS — E559 Vitamin D deficiency, unspecified: Secondary | ICD-10-CM | POA: Diagnosis not present

## 2021-02-02 DIAGNOSIS — H409 Unspecified glaucoma: Secondary | ICD-10-CM | POA: Diagnosis not present

## 2021-02-02 DIAGNOSIS — G454 Transient global amnesia: Secondary | ICD-10-CM | POA: Diagnosis not present

## 2021-02-02 DIAGNOSIS — K2100 Gastro-esophageal reflux disease with esophagitis, without bleeding: Secondary | ICD-10-CM | POA: Diagnosis not present

## 2021-02-02 DIAGNOSIS — I739 Peripheral vascular disease, unspecified: Secondary | ICD-10-CM | POA: Diagnosis not present

## 2021-02-02 DIAGNOSIS — S22082D Unstable burst fracture of T11-T12 vertebra, subsequent encounter for fracture with routine healing: Secondary | ICD-10-CM | POA: Diagnosis not present

## 2021-02-02 DIAGNOSIS — W19XXXD Unspecified fall, subsequent encounter: Secondary | ICD-10-CM | POA: Diagnosis not present

## 2021-02-02 DIAGNOSIS — N401 Enlarged prostate with lower urinary tract symptoms: Secondary | ICD-10-CM | POA: Diagnosis not present

## 2021-02-02 DIAGNOSIS — J449 Chronic obstructive pulmonary disease, unspecified: Secondary | ICD-10-CM | POA: Diagnosis not present

## 2021-02-02 DIAGNOSIS — E538 Deficiency of other specified B group vitamins: Secondary | ICD-10-CM | POA: Diagnosis not present

## 2021-02-02 DIAGNOSIS — G4733 Obstructive sleep apnea (adult) (pediatric): Secondary | ICD-10-CM | POA: Diagnosis not present

## 2021-02-02 DIAGNOSIS — M1990 Unspecified osteoarthritis, unspecified site: Secondary | ICD-10-CM | POA: Diagnosis not present

## 2021-02-02 DIAGNOSIS — E669 Obesity, unspecified: Secondary | ICD-10-CM | POA: Diagnosis not present

## 2021-02-08 DIAGNOSIS — M1990 Unspecified osteoarthritis, unspecified site: Secondary | ICD-10-CM | POA: Diagnosis not present

## 2021-02-08 DIAGNOSIS — H409 Unspecified glaucoma: Secondary | ICD-10-CM | POA: Diagnosis not present

## 2021-02-08 DIAGNOSIS — M5440 Lumbago with sciatica, unspecified side: Secondary | ICD-10-CM | POA: Diagnosis not present

## 2021-02-08 DIAGNOSIS — R296 Repeated falls: Secondary | ICD-10-CM | POA: Diagnosis not present

## 2021-02-08 DIAGNOSIS — I10 Essential (primary) hypertension: Secondary | ICD-10-CM | POA: Diagnosis not present

## 2021-02-08 DIAGNOSIS — R351 Nocturia: Secondary | ICD-10-CM | POA: Diagnosis not present

## 2021-02-08 DIAGNOSIS — J449 Chronic obstructive pulmonary disease, unspecified: Secondary | ICD-10-CM | POA: Diagnosis not present

## 2021-02-08 DIAGNOSIS — E559 Vitamin D deficiency, unspecified: Secondary | ICD-10-CM | POA: Diagnosis not present

## 2021-02-08 DIAGNOSIS — E538 Deficiency of other specified B group vitamins: Secondary | ICD-10-CM | POA: Diagnosis not present

## 2021-02-08 DIAGNOSIS — E669 Obesity, unspecified: Secondary | ICD-10-CM | POA: Diagnosis not present

## 2021-02-08 DIAGNOSIS — R338 Other retention of urine: Secondary | ICD-10-CM | POA: Diagnosis not present

## 2021-02-08 DIAGNOSIS — F419 Anxiety disorder, unspecified: Secondary | ICD-10-CM | POA: Diagnosis not present

## 2021-02-08 DIAGNOSIS — I739 Peripheral vascular disease, unspecified: Secondary | ICD-10-CM | POA: Diagnosis not present

## 2021-02-08 DIAGNOSIS — D696 Thrombocytopenia, unspecified: Secondary | ICD-10-CM | POA: Diagnosis not present

## 2021-02-08 DIAGNOSIS — W19XXXD Unspecified fall, subsequent encounter: Secondary | ICD-10-CM | POA: Diagnosis not present

## 2021-02-08 DIAGNOSIS — S22082D Unstable burst fracture of T11-T12 vertebra, subsequent encounter for fracture with routine healing: Secondary | ICD-10-CM | POA: Diagnosis not present

## 2021-02-08 DIAGNOSIS — R350 Frequency of micturition: Secondary | ICD-10-CM | POA: Diagnosis not present

## 2021-02-08 DIAGNOSIS — N401 Enlarged prostate with lower urinary tract symptoms: Secondary | ICD-10-CM | POA: Diagnosis not present

## 2021-02-08 DIAGNOSIS — G454 Transient global amnesia: Secondary | ICD-10-CM | POA: Diagnosis not present

## 2021-02-08 DIAGNOSIS — E785 Hyperlipidemia, unspecified: Secondary | ICD-10-CM | POA: Diagnosis not present

## 2021-02-08 DIAGNOSIS — K2100 Gastro-esophageal reflux disease with esophagitis, without bleeding: Secondary | ICD-10-CM | POA: Diagnosis not present

## 2021-02-08 DIAGNOSIS — I2510 Atherosclerotic heart disease of native coronary artery without angina pectoris: Secondary | ICD-10-CM | POA: Diagnosis not present

## 2021-02-08 DIAGNOSIS — S32019D Unspecified fracture of first lumbar vertebra, subsequent encounter for fracture with routine healing: Secondary | ICD-10-CM | POA: Diagnosis not present

## 2021-02-08 DIAGNOSIS — G4733 Obstructive sleep apnea (adult) (pediatric): Secondary | ICD-10-CM | POA: Diagnosis not present

## 2021-02-12 DIAGNOSIS — J449 Chronic obstructive pulmonary disease, unspecified: Secondary | ICD-10-CM | POA: Diagnosis not present

## 2021-02-15 DIAGNOSIS — G454 Transient global amnesia: Secondary | ICD-10-CM | POA: Diagnosis not present

## 2021-02-15 DIAGNOSIS — M79605 Pain in left leg: Secondary | ICD-10-CM | POA: Diagnosis not present

## 2021-02-15 DIAGNOSIS — M4328 Fusion of spine, sacral and sacrococcygeal region: Secondary | ICD-10-CM | POA: Diagnosis not present

## 2021-02-15 DIAGNOSIS — E782 Mixed hyperlipidemia: Secondary | ICD-10-CM | POA: Diagnosis not present

## 2021-02-15 DIAGNOSIS — Z79899 Other long term (current) drug therapy: Secondary | ICD-10-CM | POA: Diagnosis not present

## 2021-02-15 DIAGNOSIS — F419 Anxiety disorder, unspecified: Secondary | ICD-10-CM | POA: Diagnosis not present

## 2021-02-15 DIAGNOSIS — M545 Low back pain, unspecified: Secondary | ICD-10-CM | POA: Diagnosis not present

## 2021-02-15 DIAGNOSIS — I10 Essential (primary) hypertension: Secondary | ICD-10-CM | POA: Diagnosis not present

## 2021-02-15 DIAGNOSIS — R0602 Shortness of breath: Secondary | ICD-10-CM | POA: Diagnosis not present

## 2021-02-15 DIAGNOSIS — J90 Pleural effusion, not elsewhere classified: Secondary | ICD-10-CM | POA: Diagnosis not present

## 2021-02-15 DIAGNOSIS — M79604 Pain in right leg: Secondary | ICD-10-CM | POA: Diagnosis not present

## 2021-02-15 DIAGNOSIS — M5489 Other dorsalgia: Secondary | ICD-10-CM | POA: Diagnosis not present

## 2021-02-15 DIAGNOSIS — R739 Hyperglycemia, unspecified: Secondary | ICD-10-CM | POA: Diagnosis not present

## 2021-02-15 DIAGNOSIS — D696 Thrombocytopenia, unspecified: Secondary | ICD-10-CM | POA: Diagnosis not present

## 2021-02-20 ENCOUNTER — Emergency Department: Payer: PPO

## 2021-02-20 ENCOUNTER — Observation Stay
Admission: EM | Admit: 2021-02-20 | Discharge: 2021-02-21 | Disposition: A | Discharge status: Home / Self Care | Payer: PPO | Attending: Hospitalist | Admitting: Hospitalist

## 2021-02-20 ENCOUNTER — Other Ambulatory Visit: Payer: Self-pay

## 2021-02-20 DIAGNOSIS — I509 Heart failure, unspecified: Secondary | ICD-10-CM | POA: Diagnosis not present

## 2021-02-20 DIAGNOSIS — J4520 Mild intermittent asthma, uncomplicated: Secondary | ICD-10-CM | POA: Diagnosis not present

## 2021-02-20 DIAGNOSIS — D696 Thrombocytopenia, unspecified: Secondary | ICD-10-CM | POA: Diagnosis present

## 2021-02-20 DIAGNOSIS — R600 Localized edema: Secondary | ICD-10-CM

## 2021-02-20 DIAGNOSIS — Z79899 Other long term (current) drug therapy: Secondary | ICD-10-CM | POA: Insufficient documentation

## 2021-02-20 DIAGNOSIS — I50813 Acute on chronic right heart failure: Secondary | ICD-10-CM | POA: Diagnosis not present

## 2021-02-20 DIAGNOSIS — J45909 Unspecified asthma, uncomplicated: Secondary | ICD-10-CM | POA: Diagnosis not present

## 2021-02-20 DIAGNOSIS — I110 Hypertensive heart disease with heart failure: Principal | ICD-10-CM | POA: Insufficient documentation

## 2021-02-20 DIAGNOSIS — I517 Cardiomegaly: Secondary | ICD-10-CM | POA: Diagnosis not present

## 2021-02-20 DIAGNOSIS — I6529 Occlusion and stenosis of unspecified carotid artery: Secondary | ICD-10-CM | POA: Diagnosis present

## 2021-02-20 DIAGNOSIS — I6523 Occlusion and stenosis of bilateral carotid arteries: Secondary | ICD-10-CM | POA: Diagnosis not present

## 2021-02-20 DIAGNOSIS — G4733 Obstructive sleep apnea (adult) (pediatric): Secondary | ICD-10-CM | POA: Diagnosis not present

## 2021-02-20 DIAGNOSIS — R410 Disorientation, unspecified: Secondary | ICD-10-CM | POA: Insufficient documentation

## 2021-02-20 DIAGNOSIS — I2510 Atherosclerotic heart disease of native coronary artery without angina pectoris: Secondary | ICD-10-CM

## 2021-02-20 DIAGNOSIS — R609 Edema, unspecified: Secondary | ICD-10-CM | POA: Diagnosis not present

## 2021-02-20 DIAGNOSIS — I959 Hypotension, unspecified: Secondary | ICD-10-CM | POA: Diagnosis not present

## 2021-02-20 DIAGNOSIS — I10 Essential (primary) hypertension: Secondary | ICD-10-CM | POA: Diagnosis present

## 2021-02-20 DIAGNOSIS — R911 Solitary pulmonary nodule: Secondary | ICD-10-CM

## 2021-02-20 DIAGNOSIS — Z20822 Contact with and (suspected) exposure to covid-19: Secondary | ICD-10-CM | POA: Diagnosis not present

## 2021-02-20 DIAGNOSIS — F419 Anxiety disorder, unspecified: Secondary | ICD-10-CM | POA: Diagnosis present

## 2021-02-20 DIAGNOSIS — R2243 Localized swelling, mass and lump, lower limb, bilateral: Secondary | ICD-10-CM | POA: Diagnosis present

## 2021-02-20 DIAGNOSIS — R4182 Altered mental status, unspecified: Secondary | ICD-10-CM | POA: Diagnosis not present

## 2021-02-20 DIAGNOSIS — N401 Enlarged prostate with lower urinary tract symptoms: Secondary | ICD-10-CM | POA: Insufficient documentation

## 2021-02-20 DIAGNOSIS — M2578 Osteophyte, vertebrae: Secondary | ICD-10-CM | POA: Diagnosis not present

## 2021-02-20 DIAGNOSIS — E785 Hyperlipidemia, unspecified: Secondary | ICD-10-CM | POA: Diagnosis not present

## 2021-02-20 DIAGNOSIS — M7989 Other specified soft tissue disorders: Secondary | ICD-10-CM | POA: Diagnosis not present

## 2021-02-20 DIAGNOSIS — N400 Enlarged prostate without lower urinary tract symptoms: Secondary | ICD-10-CM | POA: Diagnosis present

## 2021-02-20 DIAGNOSIS — Z7902 Long term (current) use of antithrombotics/antiplatelets: Secondary | ICD-10-CM | POA: Insufficient documentation

## 2021-02-20 DIAGNOSIS — Z96653 Presence of artificial knee joint, bilateral: Secondary | ICD-10-CM | POA: Insufficient documentation

## 2021-02-20 DIAGNOSIS — R001 Bradycardia, unspecified: Secondary | ICD-10-CM | POA: Diagnosis not present

## 2021-02-20 DIAGNOSIS — R0602 Shortness of breath: Secondary | ICD-10-CM | POA: Diagnosis not present

## 2021-02-20 LAB — URINALYSIS, COMPLETE (UACMP) WITH MICROSCOPIC
Bacteria, UA: NONE SEEN
Bilirubin Urine: NEGATIVE
Glucose, UA: NEGATIVE mg/dL
Ketones, ur: NEGATIVE mg/dL
Leukocytes,Ua: NEGATIVE
Nitrite: NEGATIVE
Protein, ur: NEGATIVE mg/dL
Specific Gravity, Urine: 1.02 (ref 1.005–1.030)
Squamous Epithelial / HPF: NONE SEEN (ref 0–5)
pH: 7.5 (ref 5.0–8.0)

## 2021-02-20 LAB — COMPREHENSIVE METABOLIC PANEL
ALT: 10 U/L (ref 0–44)
AST: 17 U/L (ref 15–41)
Albumin: 3.4 g/dL — ABNORMAL LOW (ref 3.5–5.0)
Alkaline Phosphatase: 70 U/L (ref 38–126)
Anion gap: 10 (ref 5–15)
BUN: 23 mg/dL (ref 8–23)
CO2: 29 mmol/L (ref 22–32)
Calcium: 8.8 mg/dL — ABNORMAL LOW (ref 8.9–10.3)
Chloride: 102 mmol/L (ref 98–111)
Creatinine, Ser: 1.02 mg/dL (ref 0.61–1.24)
GFR, Estimated: >60 mL/min (ref >60)
Glucose, Bld: 112 mg/dL — ABNORMAL HIGH (ref 70–99)
Potassium: 3.5 mmol/L (ref 3.5–5.1)
Sodium: 141 mmol/L (ref 135–145)
Total Bilirubin: 1 mg/dL (ref 0.3–1.2)
Total Protein: 5.7 g/dL — ABNORMAL LOW (ref 6.5–8.1)

## 2021-02-20 LAB — RESP PANEL BY RT-PCR (FLU A&B, COVID) ARPGX2
Influenza A by PCR: NEGATIVE
Influenza B by PCR: NEGATIVE
SARS Coronavirus 2 by RT PCR: NEGATIVE

## 2021-02-20 LAB — CBC
HCT: 40.1 % (ref 39.0–52.0)
Hemoglobin: 13.1 g/dL (ref 13.0–17.0)
MCH: 31.8 pg (ref 26.0–34.0)
MCHC: 32.7 g/dL (ref 30.0–36.0)
MCV: 97.3 fL (ref 80.0–100.0)
Platelets: 125 10*3/uL — ABNORMAL LOW (ref 150–400)
RBC: 4.12 MIL/uL — ABNORMAL LOW (ref 4.22–5.81)
RDW: 13.5 % (ref 11.5–15.5)
WBC: 8.9 10*3/uL (ref 4.0–10.5)
nRBC: 0 % (ref 0.0–0.2)

## 2021-02-20 LAB — BRAIN NATRIURETIC PEPTIDE: B Natriuretic Peptide: 54.8 pg/mL (ref 0.0–100.0)

## 2021-02-20 MED ORDER — ENOXAPARIN SODIUM 40 MG/0.4ML IJ SOSY: 40.0000 mg | PREFILLED_SYRINGE | INTRAMUSCULAR | Status: DC

## 2021-02-20 MED ORDER — ACETAMINOPHEN 325 MG PO TABS: 650.0000 mg | ORAL_TABLET | Freq: Four times a day (QID) | ORAL | Status: DC | PRN

## 2021-02-20 MED ORDER — CLOPIDOGREL BISULFATE 75 MG PO TABS
75.0000 mg | ORAL_TABLET | Freq: Every day | ORAL | Status: DC
  Administered 2021-02-21: 75 mg via ORAL
  Filled 2021-02-20: qty 1

## 2021-02-20 MED ORDER — ONDANSETRON HCL 4 MG/2ML IJ SOLN: 4.0000 mg | Freq: Four times a day (QID) | INTRAMUSCULAR | Status: DC | PRN

## 2021-02-20 MED ORDER — LORAZEPAM 2 MG/ML IJ SOLN: 0.5000 mg | Freq: Four times a day (QID) | INTRAMUSCULAR | Status: DC | PRN

## 2021-02-20 MED ORDER — FUROSEMIDE 10 MG/ML IJ SOLN
40.0000 mg | Freq: Every day | INTRAMUSCULAR | Status: DC
  Administered 2021-02-21: 40 mg via INTRAVENOUS
  Filled 2021-02-20: qty 4

## 2021-02-20 MED ORDER — ACETAMINOPHEN 325 MG RE SUPP: 650.0000 mg | Freq: Four times a day (QID) | RECTAL | Status: DC | PRN

## 2021-02-20 MED ORDER — ROSUVASTATIN CALCIUM 10 MG PO TABS
10.0000 mg | ORAL_TABLET | Freq: Every day | ORAL | Status: DC
  Administered 2021-02-20: 10 mg via ORAL
  Filled 2021-02-20 (×3): qty 1

## 2021-02-20 MED ORDER — MELATONIN 5 MG PO TABS: 5.0000 mg | ORAL_TABLET | Freq: Every evening | ORAL | Status: DC | PRN

## 2021-02-20 MED ORDER — METOPROLOL SUCCINATE ER 50 MG PO TB24
100.0000 mg | ORAL_TABLET | Freq: Every day | ORAL | Status: DC
  Administered 2021-02-21: 100 mg via ORAL
  Filled 2021-02-20: qty 2

## 2021-02-20 MED ORDER — AMLODIPINE BESYLATE 5 MG PO TABS
5.0000 mg | ORAL_TABLET | Freq: Every day | ORAL | Status: DC
  Administered 2021-02-21: 5 mg via ORAL
  Filled 2021-02-20: qty 1

## 2021-02-20 MED ORDER — POLYETHYLENE GLYCOL 3350 17 G PO PACK: 17.0000 g | PACK | Freq: Two times a day (BID) | ORAL | Status: DC | PRN

## 2021-02-20 MED ORDER — ONDANSETRON HCL 4 MG PO TABS: 4.0000 mg | ORAL_TABLET | Freq: Four times a day (QID) | ORAL | Status: DC | PRN

## 2021-02-20 MED ORDER — LOSARTAN POTASSIUM 50 MG PO TABS
50.0000 mg | ORAL_TABLET | Freq: Two times a day (BID) | ORAL | Status: DC
  Administered 2021-02-20 – 2021-02-21 (×2): 50 mg via ORAL
  Filled 2021-02-20 (×2): qty 1

## 2021-02-20 MED ORDER — FUROSEMIDE 10 MG/ML IJ SOLN
40.0000 mg | Freq: Once | INTRAMUSCULAR | Status: AC
  Administered 2021-02-20: 40 mg via INTRAVENOUS
  Filled 2021-02-20: qty 4

## 2021-02-20 NOTE — Assessment & Plan Note (Signed)
-   Resumed amlodipine 5 mg daily, losartan 50 mg p.o. twice daily, metoprolol succinate 100 mg daily

## 2021-02-20 NOTE — ED Notes (Signed)
Chris Kent [place]) [phone]  Will be the family member to pick pt up at discharge

## 2021-02-20 NOTE — H&P (Signed)
History and Physical   DOLPH TAVANO JJH:[id number] DOB: [birth year] DOA: 02/20/2021  PCP: Idelle Crouch, MD Outpatient Specialists: Dr. Bernardo Heater Patient coming from: home   I have personally briefly reviewed patient's old medical records in [place].  Chief Concern: Swelling in his legs  HPI: [name] is an 86 year old male with history of CAD, hypertension, hyperlipidemia, insomnia, OSA, anxiety, who presents emergency department for chief concerns of confusion and worsening shortness of breath.  Vitals in the emergency department showed temperature of 97.8, respiration rate of 21, heart rate of 72, initial blood pressure 127/64, SPO2 of 97% on room air.  Labs in the emergency department showed serum sodium 141, potassium 3.5, chloride 1032, bicarb of 29, BUN of 23, serum creatinine of 1.02, nonfasting blood glucose 112, GFR greater than 60, WBC 8.9, platelets 125, hemoglobin 13.1.  BNP remarkably was 54.8.  COVID/influenza A/influenza B PCR is pending.  ED provider ordered patient furosemide 40 mg IV one-time dose.  At bedside he is able to tell me his full name, his age, the current calendar year and he knows he is in the hospital.  He reports that he is in the hospital because of swelling in his legs.  He states that this has been ongoing for the last 1 month.  He denies abdominal pain.  He states he has been to his doctor twice for it however it has only minimally gotten better.  He states this is never happened before. He endorses compliance with home medications.    He reports that after he received the IV medication, and urinated, he feels his legs are much better.  He denies any chest pain, shortness of breath, nausea, vomiting, dysuria, hematuria, change in urinary status, diarrhea, loss of consciousness.  He states that his belly peers to be increasingly distended in the last 1 month.  He denies any changes to his diet.  Social history: He lives at  home by himself, he denies any history of tobacco, EtOH, recreational drug use.  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  Vaccination history: He states he is vaccinated for COVID-19.  ROS: Constitutional: + weight change, no fever ENT/Mouth: no sore throat, no rhinorrhea Eyes: no eye pain, no vision changes Cardiovascular: no chest pain, no dyspnea,  + edema, no palpitations Respiratory: no cough, no sputum, no wheezing Gastrointestinal: no nausea, no vomiting, no diarrhea, no constipation Genitourinary: no urinary incontinence, no dysuria, no hematuria Musculoskeletal: no arthralgias, no myalgias Skin: no skin lesions, no pruritus, Neuro: + weakness, no loss of consciousness, no syncope Psych: no anxiety, no depression, + decrease appetite Heme/Lymph: no bruising, no bleeding  ED Course: Discussed with emergency medicine provider, patient requiring hospitalization for chief concerns of heart failure exacerbation.  Assessment/Plan  Principal Problem:   Acute exacerbation of CHF (congestive heart failure) (HCC) Active Problems:   Essential hypertension, benign   Hyperlipidemia   Carotid stenosis   Anxiety   Asthma without status asthmaticus   Enlarged prostate with lower urinary tract symptoms (LUTS)   Obstructive sleep apnea hypopnea, moderate   Thrombocytopenia (HCC)   CAD (coronary artery disease)    Cardiovascular and Mediastinum CAD (coronary artery disease) Assessment & Plan - Resumed home rosuvastatin 10 mg nightly, Plavix 75 mg daily, resumed home antihypertensive medications  Essential hypertension, benign Assessment & Plan - Resumed amlodipine 5 mg daily, losartan 50 mg p.o. twice daily, metoprolol succinate 100 mg daily  * Acute exacerbation of CHF (congestive heart  failure) ([place]) Assessment & Plan - Failed outpatient therapy - Complete echo ordered, strict I's and O's, daily weights - Status post furosemide 40 mg IV per EDP - Ordered furosemide 40 mg IV  daily starting on 02/21/2021, 2 doses ordered  Respiratory Obstructive sleep apnea hypopnea, moderate Assessment & Plan - CPAP nightly ordered  Other Hyperlipidemia Assessment & Plan - Resumed rosuvastatin 10 mg daily  Chart reviewed.   Outpatient PCP visit on 02/15/2021: Patient was resumed on furosemide 20 mg tablet daily, portable chest x-ray and lumbar films ordered.  Echo was ordered.  Return in 2 weeks.  DVT prophylaxis: Enoxaparin 40 mg subcutaneous every 24 hours Code Status: Full code Diet: Heart healthy Family Communication: no Disposition Plan: Pending clinical course Consults called: None at this time Admission status: Telemetry cardiac, observation  Past Medical History:  Diagnosis Date   Anxiety    Arthritis    Asthma    GERD (gastroesophageal reflux disease)    Glaucoma    Glaucoma    History of stroke    Hyperlipidemia    Hyperlipidemia    Hypertension    Peripheral vascular disease ([place])    Sleep apnea    Past Surgical History:  Procedure Laterality Date   REPLACEMENT TOTAL KNEE BILATERAL     Social History:  reports that he has never smoked. He has never used smokeless tobacco. He reports that he does not drink alcohol and does not use drugs.  No Known Allergies Family History  Problem Relation Age of Onset   Prostate cancer Father    Family history: Family history reviewed and not pertinent.  Prior to Admission medications   Medication Sig Start Date End Date Taking? Authorizing Provider  amLODipine (NORVASC) 5 MG tablet Take 5 mg by mouth daily. 01/21/21  Yes [provider]  cetirizine (ZYRTEC) 10 MG tablet Take 10 mg by mouth at bedtime.   Yes [provider]  clopidogrel (PLAVIX) 75 MG tablet Take 1 tablet (75 mg total) by mouth daily. Resume 14 days after your surgery, which is 12/12/20. 12/12/20  Yes Enzo Bi, MD  Dorzolamide HCl-Timolol Mal PF 22.3-6.8 MG/ML SOLN Place 1 drop into the right eye 2 (two) times daily.    Yes [provider]  ergocalciferol (VITAMIN D2) 1.25 MG (50000 UT) capsule Take 50,000 Units by mouth once a week. 11/01/20  Yes [provider]  furosemide (LASIX) 20 MG tablet Take 40 mg by mouth daily. 02/15/21  Yes [provider]  latanoprost (XALATAN) 0.005 % ophthalmic solution Place 1 drop into both eyes at bedtime.   Yes [provider]  losartan (COZAAR) 50 MG tablet Take 50 mg by mouth 2 (two) times daily.   Yes [provider]  metoprolol succinate (TOPROL-XL) 50 MG 24 hr tablet Take 1 tablet (50 mg total) by mouth daily. Take with or immediately following a meal. Patient taking differently: Take 100 mg by mouth daily. Take with or immediately following a meal. 11/01/20  Yes Bonnielee Haff, MD  pantoprazole (PROTONIX) 40 MG tablet Take 40 mg by mouth daily as needed. 11/01/20  Yes [provider]  rosuvastatin (CRESTOR) 10 MG tablet Take 10 mg by mouth at bedtime.   Yes [provider]  tamsulosin (FLOMAX) 0.4 MG CAPS capsule Take 2 capsules (0.8 mg total) by mouth daily after supper. 10/31/20  Yes Bonnielee Haff, MD  acetaminophen (TYLENOL) 500 MG tablet Take 2 tablets (1,000 mg total) by mouth 3 (three) times daily. 10/31/20  Bonnielee Haff, MD  bisacodyl (DULCOLAX) 10 MG suppository Place 1 suppository (10 mg total) rectally daily as needed for moderate constipation. 10/31/20   Bonnielee Haff, MD  naproxen (NAPROSYN) 250 MG tablet Take 1 tablet (250 mg total) by mouth 2 (two) times daily as needed (for pain not relieved with oxycodone). Patient not taking: Reported on 02/20/2021 10/31/20   Bonnielee Haff, MD  polyethylene glycol (MIRALAX / GLYCOLAX) 17 g packet Take 17 g by mouth 2 (two) times daily. While you are on pain medication. Patient not taking: Reported on 02/20/2021 11/30/20   Enzo Bi, MD  senna-docusate (SENOKOT-S) 8.6-50 MG tablet Take 2 tablets by mouth 2 (two) times daily. While you are on pain medication.  11/30/20   Enzo Bi, MD  triamcinolone cream (KENALOG) 0.1 % Apply 1 application topically 3 (three) times daily. (Apply to rash on legs)    [provider]  Grant Ruts INHUB 250-50 MCG/ACT AEPB Inhale 1 puff into the lungs 2 (two) times daily. Patient not taking: Reported on 02/20/2021 11/01/20   [provider]  zolpidem (AMBIEN) 10 MG tablet Take 10 mg by mouth at bedtime as needed for sleep. Patient not taking: Reported on 02/20/2021    [provider]   Physical Exam: Vitals:   02/20/21 2030 02/20/21 2100 02/20/21 2200 02/20/21 2256  BP: 125/66 126/68 119/79 131/70  Pulse: 70 71 68 70  Resp: 16 19 15 20   Temp:    97.7 F (36.5 C)  TempSrc:    Oral  SpO2: 96% 96% 97% 97%   Constitutional: appears age-appropriate, frail, NAD, calm, comfortable Eyes: PERRL, lids and conjunctivae normal ENMT: Mucous membranes are moist. Posterior pharynx clear of any exudate or lesions. Age-appropriate dentition. Hearing appropriate Neck: normal, supple, no masses, no thyromegaly Respiratory: clear to auscultation bilaterally, no wheezing, no crackles. Normal respiratory effort. No accessory muscle use.  Cardiovascular: Regular rate and rhythm, no murmurs / rubs / gallops.  Bilateral lower extremity 2+ pitting edema. 2+ pedal pulses. No carotid bruits.  Abdomen: Distended abdomen, no tenderness, no masses palpated, no hepatosplenomegaly. Bowel sounds positive.  Musculoskeletal: no clubbing / cyanosis. No joint deformity upper and lower extremities. Good ROM, no contractures, no atrophy. Normal muscle tone.  Skin: no rashes, lesions, ulcers. No induration Neurologic: Sensation intact. Strength 5/5 in all 4.  Psychiatric: Normal judgment and insight. Alert and oriented x 3. Normal mood.   EKG: independently reviewed, showing sinus rhythm with a rate of 71, QTc 499  Chest x-ray on Admission: I personally reviewed and I agree with radiologist reading as below.  CT HEAD WO CONTRAST  (5MM)  Result Date: 02/20/2021 CLINICAL DATA:  Mental status change, unknown cause EXAM: CT HEAD WITHOUT CONTRAST TECHNIQUE: Contiguous axial images were obtained from the base of the skull through the vertex without intravenous contrast. RADIATION DOSE REDUCTION: This exam was performed according to the departmental dose-optimization program which includes automated exposure control, adjustment of the mA and/or kV according to patient size and/or use of iterative reconstruction technique. COMPARISON:  CT head 12/06/2020 BRAIN: BRAIN Patchy and confluent areas of decreased attenuation are noted throughout the deep and periventricular white matter of the cerebral hemispheres bilaterally, compatible with chronic microvascular ischemic disease. No evidence of large-territorial acute infarction. No parenchymal hemorrhage. No mass lesion. No extra-axial collection. No mass effect or midline shift. No hydrocephalus. Basilar cisterns are patent. Vascular: No hyperdense vessel. Atherosclerotic calcifications are present within the cavernous internal carotid arteries. Skull: No acute fracture or focal lesion.  Sinuses/Orbits: Paranasal sinuses and mastoid air cells are clear. The orbits are unremarkable. Other: None. IMPRESSION: No acute intracranial abnormality. Electronically Signed   By: Iven Finn M.D.   On: 02/20/2021 21:52   CT Cervical Spine Wo Contrast  Result Date: 02/20/2021 CLINICAL DATA:  Trauma. EXAM: CT CERVICAL SPINE WITHOUT CONTRAST TECHNIQUE: Multidetector CT imaging of the cervical spine was performed without intravenous contrast. Multiplanar CT image reconstructions were also generated. RADIATION DOSE REDUCTION: This exam was performed according to the departmental dose-optimization program which includes automated exposure control, adjustment of the mA and/or kV according to patient size and/or use of iterative reconstruction technique. COMPARISON:  Cervical spine CT dated 10/26/2020. FINDINGS:  Alignment: No acute subluxation. Skull base and vertebrae: No acute fracture. Severe osteopenia. C4-T2 posterior fusion. The hardware is intact. Soft tissues and spinal canal: No prevertebral fluid or swelling. No visible canal hematoma. Disc levels: Multilevel degenerative changes. Anterior bridging osteophyte consistent with DISH. Upper chest: Negative. Other: None IMPRESSION: 1. No acute fracture or subluxation of the cervical spine. 2. C4-T2 posterior fusion. The hardware is intact. Electronically Signed   By: Anner Crete M.D.   On: 02/20/2021 21:52   US Venous Img Lower Bilateral  Result Date: 02/20/2021 CLINICAL DATA:  Bilateral lower extremity swelling.  Rule out DVT. EXAM: Bilateral LOWER EXTREMITY VENOUS DOPPLER ULTRASOUND TECHNIQUE: Gray-scale sonography with compression, as well as color and duplex ultrasound, were performed to evaluate the deep venous system(s) from the level of the common femoral vein through the popliteal and proximal calf veins. COMPARISON:  None. FINDINGS: VENOUS Normal compressibility of the common femoral, superficial femoral, and popliteal veins, as well as the visualized calf veins. Visualized portions of profunda femoral vein and great saphenous vein unremarkable. No filling defects to suggest DVT on grayscale or color Doppler imaging. Doppler waveforms show normal direction of venous flow, normal respiratory plasticity and response to augmentation. Limited views of the contralateral common femoral vein are unremarkable. OTHER None. Limitations: none IMPRESSION: Negative. Electronically Signed   By: Anner Crete M.D.   On: 02/20/2021 20:57   DG Chest Portable 1 View  Result Date: 02/20/2021 CLINICAL DATA:  Bilateral leg swelling.  Rule out heart failure EXAM: PORTABLE CHEST 1 VIEW COMPARISON:  09/19/2010 FINDINGS: Mild cardiac enlargement. Negative for heart failure or edema. Possible small left pleural effusion versus pleural scarring. 1 cm round density  overlying the right lung base is not seen on the prior study. This could be within the chest or outside the chest Pedicle screw and rod fusion in the cervical and upper thoracic spine and in the lower thoracic spine. IMPRESSION: Cardiac enlargement without heart failure. Question 1 cm right lower lobe lung nodule. Recommend follow-up two-view chest x-ray. Electronically Signed   By: Franchot Gallo M.D.   On: 02/20/2021 19:19    Labs on Admission: I have personally reviewed following labs  CBC: Recent Labs  Lab 02/20/21 1848  WBC 8.9  HGB 13.1  HCT 40.1  MCV 97.3  PLT 500*   Basic Metabolic Panel: Recent Labs  Lab 02/20/21 1848  NA 141  K 3.5  CL 102  CO2 29  GLUCOSE 112*  BUN 23  CREATININE 1.02  CALCIUM 8.8*   GFR: CrCl cannot be calculated (Unknown ideal weight.).  Liver Function Tests: Recent Labs  Lab 02/20/21 1848  AST 17  ALT 10  ALKPHOS 70  BILITOT 1.0  PROT 5.7*  ALBUMIN 3.4*   Urine analysis:    Component Value Date/Time  COLORURINE YELLOW 02/20/2021 2155   APPEARANCEUR CLEAR 02/20/2021 2155   APPEARANCEUR Clear 11/21/2017 1011   LABSPEC 1.020 02/20/2021 2155   LABSPEC 1.013 09/20/2012 0122   PHURINE 7.5 02/20/2021 2155   GLUCOSEU NEGATIVE 02/20/2021 2155   GLUCOSEU Negative 09/20/2012 0122   HGBUR TRACE (A) 02/20/2021 2155   BILIRUBINUR NEGATIVE 02/20/2021 2155   BILIRUBINUR Negative 11/21/2017 1011   BILIRUBINUR Negative 09/20/2012 0122   KETONESUR NEGATIVE 02/20/2021 2155   PROTEINUR NEGATIVE 02/20/2021 2155   NITRITE NEGATIVE 02/20/2021 2155   LEUKOCYTESUR NEGATIVE 02/20/2021 2155   LEUKOCYTESUR Negative 09/20/2012 0122   Dr. Tobie Poet [facility]  If 7PM-7AM, please contact overnight-coverage provider If 7AM-7PM, please contact day coverage provider [email or web address]  02/20/2021, 11:16 PM

## 2021-02-20 NOTE — Assessment & Plan Note (Signed)
-   Resumed rosuvastatin 10 mg daily

## 2021-02-20 NOTE — Assessment & Plan Note (Signed)
-   Resumed home rosuvastatin 10 mg nightly, Plavix 75 mg daily, resumed home antihypertensive medications

## 2021-02-20 NOTE — ED Triage Notes (Signed)
Pt comes via EMS from home with c/o bilateral leg swelling and weight gain of 26 lbs in last month.  Pt denies any CP, SOB, dizziness or pain.   Pt does have 4+ pitting edema in lower extremities.  BP_137/69 HR-72 O2-98 % RA  Pt started new fluid pill on Thursday.

## 2021-02-20 NOTE — Assessment & Plan Note (Signed)
-   CPAP nightly ordered 

## 2021-02-20 NOTE — Assessment & Plan Note (Signed)
-   Failed outpatient therapy - Complete echo ordered, strict I's and O's, daily weights - Status post furosemide 40 mg IV per EDP - Ordered furosemide 40 mg IV daily starting on 02/21/2021, 2 doses ordered

## 2021-02-20 NOTE — ED Notes (Signed)
Ultrasound at bedside. Will give lasix once imaging is complete.

## 2021-02-20 NOTE — Hospital Course (Signed)
[name] is an 86 year old male with history of CAD, hypertension, hyperlipidemia, insomnia, OSA, anxiety, who presents emergency department for chief concerns of confusion and worsening shortness of breath.  Vitals in the emergency department showed temperature of 97.8, respiration rate of 21, heart rate of 72, initial blood pressure 127/64, SPO2 of 97% on room air.  Labs in the emergency department showed serum sodium 141, potassium 3.5, chloride 1032, bicarb of 29, BUN of 23, serum creatinine of 1.02, nonfasting blood glucose 112, GFR greater than 60, WBC 8.9, platelets 125, hemoglobin 13.1.  BNP remarkably was 54.8.  COVID/influenza A/influenza B PCR is pending.  ED provider ordered patient furosemide 40 mg IV one-time dose.

## 2021-02-20 NOTE — ED Provider Notes (Signed)
[facility] Provider Note    None    (approximate)   History   Leg Swelling   HPI  Chris Kent is a 86 y.o. male who is the ER for evaluation of bilateral lower extremity swelling has been getting worse over the past several weeks.  Part of a 25 pound weight gain.  Just had outpatient  PCP follow-up in clinic which she was started on Lasix.  Really not having much improvement.  Was reportedly having some shortness of breath with exertion but he denies any at this time.     Physical Exam   Triage Vital Signs: ED Triage Vitals [02/20/21 1847]  Enc Vitals Group     BP 127/64     Pulse Rate 72     Resp (!) 21     Temp 97.8 F (36.6 C)     Temp src      SpO2 97 %     Weight      Height      Head Circumference      Peak Flow      Pain Score 0     Pain Loc      Pain Edu?      Excl. in [place]?     Most recent vital signs: Vitals:   02/20/21 2200 02/20/21 2256  BP: 119/79 131/70  Pulse: 68 70  Resp: 15 20  Temp:  97.7 F (36.5 C)  SpO2: 97% 97%     Constitutional: Alert  Eyes: Conjunctivae are normal.  Head: Atraumatic. Nose: No congestion/rhinnorhea. Mouth/Throat: Mucous membranes are moist.   Neck: Painless ROM.  Cardiovascular:   Good peripheral circulation. Respiratory: Normal respiratory effort.  No retractions.  Gastrointestinal: Soft and nontender.  Musculoskeletal:  no deformity  2+ BLE edema Neurologic:  MAE spontaneously. No gross focal neurologic deficits are appreciated.  Skin:  Skin is warm, dry and intact. No rash noted. Psychiatric: Mood and affect are normal. Speech and behavior are normal.    ED Results / Procedures / Treatments   Labs (all labs ordered are listed, but only abnormal results are displayed) Labs Reviewed  CBC - Abnormal; Notable for the following components:      Result Value   RBC 4.12 (*)    Platelets 125 (*)    All other components within normal limits  COMPREHENSIVE METABOLIC PANEL -  Abnormal; Notable for the following components:   Glucose, Bld 112 (*)    Calcium 8.8 (*)    Total Protein 5.7 (*)    Albumin 3.4 (*)    All other components within normal limits  URINALYSIS, COMPLETE (UACMP) WITH MICROSCOPIC - Abnormal; Notable for the following components:   Hgb urine dipstick TRACE (*)    All other components within normal limits  RESP PANEL BY RT-PCR (FLU A&B, COVID) ARPGX2  BRAIN NATRIURETIC PEPTIDE  BASIC METABOLIC PANEL  CBC  AMMONIA  PROCALCITONIN     EKG  ED ECG REPORT I, Merlyn Lot, the attending physician, personally viewed and interpreted this ECG.   Date: 02/20/2021  EKG Time: 18:46  Rate: 70  Rhythm: sinus  Axis: left  Intervals:rbbb  ST&T Change: nonspecific st abn, no stemi    RADIOLOGY Please see ED Course for my review and interpretation.  I personally reviewed all radiographic images ordered to evaluate for the above acute complaints and reviewed radiology reports and findings.  These findings were personally discussed with the patient.  Please see medical record for  radiology report.    PROCEDURES:  Critical Care performed: No  Procedures   MEDICATIONS ORDERED IN ED: Medications  amLODipine (NORVASC) tablet 5 mg (has no administration in time range)  losartan (COZAAR) tablet 50 mg (50 mg Oral Given 02/20/21 2331)  metoprolol succinate (TOPROL-XL) 24 hr tablet 100 mg (has no administration in time range)  rosuvastatin (CRESTOR) tablet 10 mg (10 mg Oral Given 02/20/21 2331)  clopidogrel (PLAVIX) tablet 75 mg (has no administration in time range)  acetaminophen (TYLENOL) tablet 650 mg (has no administration in time range)    Or  acetaminophen (TYLENOL) suppository 650 mg (has no administration in time range)  ondansetron (ZOFRAN) tablet 4 mg (has no administration in time range)    Or  ondansetron (ZOFRAN) injection 4 mg (has no administration in time range)  furosemide (LASIX) injection 40 mg (has no administration in  time range)  enoxaparin (LOVENOX) injection 40 mg (has no administration in time range)  polyethylene glycol (MIRALAX / GLYCOLAX) packet 17 g (has no administration in time range)  melatonin tablet 5 mg (has no administration in time range)  LORazepam (ATIVAN) injection 0.5 mg (has no administration in time range)  furosemide (LASIX) injection 40 mg (40 mg Intravenous Given 02/20/21 2054)     IMPRESSION / MDM / ASSESSMENT AND PLAN / ED COURSE  I reviewed the triage vital signs and the nursing notes.                              Differential diagnosis includes, but is not limited to, chf, anasarca, ckd, hypervolemia, lymphedema, cellulitis  Presenting for evaluation of swelling and edema.  Concerning for CHF anasarca.  Buttocks and above differential.  Not any respiratory distress. The patient will be placed on continuous pulse oximetry and telemetry for monitoring.  Laboratory evaluation will be sent to evaluate for the above complaints.     Clinical Course as of 02/20/21 2353  Tue Feb 20, 2021  1908 My review of chest x-ray does show some cardiomegaly no pneumothorax. [PR]  1953 No leukocytosis.  Hemoglobin normal.  Borderline decrease of protein and albumin renal function otherwise normal. [PR]  1953 BNP normal.  [PR]  2109 Family bedside providing additional history saying that his 55 office did not send him to the ER and he knows his physical therapist is reporting increasing weakness confusion patient was hallucinating.  Unable to ambulate even with assistance which is new for him.  Reported significant weight gain despite diuretic.  Patient was brought to the ER because they could not reach anyone in doctor's office. [PR]  2204 She had she has no acute abnormality by my review does not show any evidence of subdural hematoma.  Given significant weight gain will continue with plan for diuresis.  He was references not have echocardiogram given cardiomegaly will discuss with  hospitalist for observation for IV diuresis evaluation possible congestive heart failure. [PR]    Clinical Course User Index [PR] Merlyn Lot, MD     FINAL CLINICAL IMPRESSION(S) / ED DIAGNOSES   Final diagnoses:  Leg edema     Rx / DC Orders   ED Discharge Orders     None        Note:  This document was prepared using Dragon voice recognition software and may include unintentional dictation errors.    Merlyn Lot, MD 02/20/21 [phone]

## 2021-02-20 NOTE — ED Notes (Signed)
Blood drawn with IV start. Light green, purple, and blue top tubed to lab at this time.

## 2021-02-21 ENCOUNTER — Observation Stay: Payer: PPO

## 2021-02-21 ENCOUNTER — Observation Stay (HOSPITAL_BASED_OUTPATIENT_CLINIC_OR_DEPARTMENT_OTHER)
Admit: 2021-02-21 | Discharge: 2021-02-21 | Disposition: A | Discharge status: Home / Self Care | Payer: PPO | Attending: Internal Medicine | Admitting: Internal Medicine

## 2021-02-21 DIAGNOSIS — M7989 Other specified soft tissue disorders: Secondary | ICD-10-CM | POA: Diagnosis not present

## 2021-02-21 DIAGNOSIS — I517 Cardiomegaly: Secondary | ICD-10-CM | POA: Diagnosis not present

## 2021-02-21 DIAGNOSIS — R0609 Other forms of dyspnea: Secondary | ICD-10-CM

## 2021-02-21 DIAGNOSIS — R911 Solitary pulmonary nodule: Secondary | ICD-10-CM | POA: Diagnosis not present

## 2021-02-21 LAB — CBC
HCT: 41.1 % (ref 39.0–52.0)
Hemoglobin: 13.3 g/dL (ref 13.0–17.0)
MCH: 31.1 pg (ref 26.0–34.0)
MCHC: 32.4 g/dL (ref 30.0–36.0)
MCV: 96.3 fL (ref 80.0–100.0)
Platelets: 118 10*3/uL — ABNORMAL LOW (ref 150–400)
RBC: 4.27 MIL/uL (ref 4.22–5.81)
RDW: 13.4 % (ref 11.5–15.5)
WBC: 9.5 10*3/uL (ref 4.0–10.5)
nRBC: 0 % (ref 0.0–0.2)

## 2021-02-21 LAB — BASIC METABOLIC PANEL
Anion gap: 6 (ref 5–15)
BUN: 19 mg/dL (ref 8–23)
CO2: 30 mmol/L (ref 22–32)
Calcium: 8.3 mg/dL — ABNORMAL LOW (ref 8.9–10.3)
Chloride: 99 mmol/L (ref 98–111)
Creatinine, Ser: 0.88 mg/dL (ref 0.61–1.24)
GFR, Estimated: >60 mL/min (ref >60)
Glucose, Bld: 105 mg/dL — ABNORMAL HIGH (ref 70–99)
Potassium: 3.1 mmol/L — ABNORMAL LOW (ref 3.5–5.1)
Sodium: 135 mmol/L (ref 135–145)

## 2021-02-21 LAB — ECHOCARDIOGRAM COMPLETE
Height: 69 in
S' Lateral: 3.4 cm
Weight: 3542.4 oz

## 2021-02-21 LAB — PROCALCITONIN: Procalcitonin: <0.1 ng/mL

## 2021-02-21 LAB — AMMONIA: Ammonia: 20 umol/L (ref 9–35)

## 2021-02-21 MED ORDER — ACETAMINOPHEN 500 MG PO TABS: 1000.0000 mg | ORAL_TABLET | Freq: Three times a day (TID) | ORAL | 0 refills | Status: DC | PRN

## 2021-02-21 MED ORDER — METOPROLOL SUCCINATE ER 50 MG PO TB24: 100.0000 mg | ORAL_TABLET | Freq: Every day | ORAL | Status: DC

## 2021-02-21 MED ORDER — ENOXAPARIN SODIUM 40 MG/0.4ML IJ SOSY
40.0000 mg | PREFILLED_SYRINGE | Freq: Every day | INTRAMUSCULAR | Status: DC
  Administered 2021-02-21: 40 mg via SUBCUTANEOUS
  Filled 2021-02-21: qty 0.4

## 2021-02-21 MED ORDER — POTASSIUM CHLORIDE CRYS ER 20 MEQ PO TBCR
40.0000 meq | EXTENDED_RELEASE_TABLET | Freq: Once | ORAL | Status: AC
Start: 2021-02-21 — End: 2021-02-21
  Administered 2021-02-21: 40 meq via ORAL
  Filled 2021-02-21: qty 2

## 2021-02-21 NOTE — ED Notes (Signed)
Pt to be d/c per MD orders, discharge instructions discussed with pt , pt verbalized understanding.IV removed catheter intact, Per patient grandson will be here in 1 hour to pick him up.

## 2021-02-21 NOTE — ED Notes (Signed)
Pt was DC by previous Therapist, sports.

## 2021-02-21 NOTE — Discharge Summary (Signed)
Physician Discharge Summary   Chris Kent  male DOB: 1935-05-21  UKG:[id number]  PCP: Idelle Crouch, MD  Admit date: 02/20/2021 Discharge date: 02/21/2021  Admitted From: home Disposition:  home Home Health: Yes CODE STATUS: Full code  Discharge Instructions     Discharge instructions   Complete by: As directed    IV lasix has helped you get rid of some extra fluids in your legs.  Please continue to take oral lasix 40 mg daily as prescribed by your PCP.  I have stopped your amlodipine as it can cause swelling in some people. [facility] Course:  For full details, please see H&P, progress notes, consult notes and ancillary notes.  Briefly,  [name] is an 86 year old male with history of CAD, hypertension, OSA, anxiety, who presented to emergency department for chief concerns of swelling in his legs.  Pt denied dyspnea, and was not hypoxic.  BLE swelling likely 2/2 Venous stasis --BNP only 54.  CXR showed no acute finding.  Pt said PCP started him on oral lasix, however, it didn't work as well as IV lasix pt received in the hospital, which resulted in a lot more urine output and improvement in leg swelling the next day.  Since pt had no dyspnea nor hypoxia, it was not felt necessary to keep pt inpatient for IV diuresis.  Pt was able to get up and walk around the nurse's station with PT.  Pt was discharged on oral lasix 40 mg daily and to follow up with PCP.  CAD (coronary artery disease) --cont home statin and plavix   Essential hypertension, benign --home amlodipine d/c'ed since it may cause leg swelling in some pts. --cont home losartan and Toprol --cont lasix at 40 mg daily   Acute exacerbation of CHF, ruled out --Echo showed normal LVEF.  Diastolic function not determined.  Except for leg swelling (which can be due to venous stasis), pt didn't have other signs of CHF exacerbation.     Obstructive sleep apnea hypopnea, moderate --cont  CPAP nightly   Hyperlipidemia --cont home statin   Discharge Diagnoses:  Principal Problem:   Acute exacerbation of CHF (congestive heart failure) (HCC) Active Problems:   Essential hypertension, benign   Hyperlipidemia   Carotid stenosis   Anxiety   Asthma without status asthmaticus   Enlarged prostate with lower urinary tract symptoms (LUTS)   Obstructive sleep apnea hypopnea, moderate   Thrombocytopenia (HCC)   CAD (coronary artery disease)     Discharge Instructions:  Allergies as of 02/21/2021   No Known Allergies      Medication List     STOP taking these medications    amLODipine 5 MG tablet Commonly known as: NORVASC   naproxen 250 MG tablet Commonly known as: NAPROSYN   polyethylene glycol 17 g packet Commonly known as: MIRALAX / GLYCOLAX   Wixela Inhub 250-50 MCG/ACT Aepb Generic drug: fluticasone-salmeterol   zolpidem 10 MG tablet Commonly known as: AMBIEN       TAKE these medications    acetaminophen 500 MG tablet Commonly known as: TYLENOL Take 2 tablets (1,000 mg total) by mouth 3 (three) times daily as needed. Home med. What changed:  when to take this reasons to take this additional instructions   bisacodyl 10 MG suppository Commonly known as: DULCOLAX Place 1 suppository (10 mg total) rectally daily as needed for moderate constipation.   cetirizine 10 MG tablet Commonly known as: ZYRTEC  Take 10 mg by mouth at bedtime.   clopidogrel 75 MG tablet Commonly known as: PLAVIX Take 1 tablet (75 mg total) by mouth daily. Resume 14 days after your surgery, which is 12/12/20.   dorzolamidel-timolol 22.3-6.8 MG/ML Soln ophthalmic solution Commonly known as: COSOPT Place 1 drop into the right eye 2 (two) times daily.   ergocalciferol 1.25 MG (50000 UT) capsule Commonly known as: VITAMIN D2 Take 50,000 Units by mouth once a week.   furosemide 20 MG tablet Commonly known as: LASIX Take 40 mg by mouth daily.   latanoprost 0.005 %  ophthalmic solution Commonly known as: XALATAN Place 1 drop into both eyes at bedtime.   losartan 50 MG tablet Commonly known as: COZAAR Take 50 mg by mouth 2 (two) times daily.   metoprolol succinate 50 MG 24 hr tablet Commonly known as: TOPROL-XL Take 2 tablets (100 mg total) by mouth daily. Home med. What changed:  how much to take additional instructions   pantoprazole 40 MG tablet Commonly known as: PROTONIX Take 40 mg by mouth daily as needed.   rosuvastatin 10 MG tablet Commonly known as: CRESTOR Take 10 mg by mouth at bedtime.   senna-docusate 8.6-50 MG tablet Commonly known as: Senokot-S Take 2 tablets by mouth 2 (two) times daily. While you are on pain medication.   tamsulosin 0.4 MG Caps capsule Commonly known as: FLOMAX Take 2 capsules (0.8 mg total) by mouth daily after supper.   triamcinolone cream 0.1 % Commonly known as: KENALOG Apply 1 application topically 3 (three) times daily. (Apply to rash on legs)         Follow-up Information     Idelle Crouch, MD Follow up in 1 week(s).   Specialty: Internal Medicine Contact information: [place] [address] [phone]                 No Known Allergies   The results of significant diagnostics from this hospitalization (including imaging, microbiology, ancillary and laboratory) are listed below for reference.   Consultations:   Procedures/Studies: DG Chest 2 View  Result Date: 02/21/2021 CLINICAL DATA:  Lung nodule EXAM: CHEST - 2 VIEW COMPARISON:  Chest x-ray 02/20/2021 FINDINGS: Cardiomegaly and mediastinum are unchanged. Calcified plaques in the aortic arch. Pulmonary vasculature is normal. No focal consolidation identified. No pleural effusion or pneumothorax. 8 mm somewhat nodular opacity visualized in the right lower chest/lung zone, not as well defined as on previous study, may represent a nipple shadow. IMPRESSION: 1. No acute  intrathoracic process identified. 2. 8 mm somewhat nodular opacity again seen in the right lower chest, may represent a nipple shadow. Consider nipple markers on future exams. 3. Cardiomegaly. Electronically Signed   By: Ofilia Neas M.D.   On: 02/21/2021 08:56   CT HEAD WO CONTRAST (5MM)  Result Date: 02/20/2021 CLINICAL DATA:  Mental status change, unknown cause EXAM: CT HEAD WITHOUT CONTRAST TECHNIQUE: Contiguous axial images were obtained from the base of the skull through the vertex without intravenous contrast. RADIATION DOSE REDUCTION: This exam was performed according to the departmental dose-optimization program which includes automated exposure control, adjustment of the mA and/or kV according to patient size and/or use of iterative reconstruction technique. COMPARISON:  CT head 12/06/2020 BRAIN: BRAIN Patchy and confluent areas of decreased attenuation are noted throughout the deep and periventricular white matter of the cerebral hemispheres bilaterally, compatible with chronic microvascular ischemic disease. No evidence of large-territorial acute infarction. No parenchymal hemorrhage. No  mass lesion. No extra-axial collection. No mass effect or midline shift. No hydrocephalus. Basilar cisterns are patent. Vascular: No hyperdense vessel. Atherosclerotic calcifications are present within the cavernous internal carotid arteries. Skull: No acute fracture or focal lesion. Sinuses/Orbits: Paranasal sinuses and mastoid air cells are clear. The orbits are unremarkable. Other: None. IMPRESSION: No acute intracranial abnormality. Electronically Signed   By: Iven Finn M.D.   On: 02/20/2021 21:52   CT Cervical Spine Wo Contrast  Result Date: 02/20/2021 CLINICAL DATA:  Trauma. EXAM: CT CERVICAL SPINE WITHOUT CONTRAST TECHNIQUE: Multidetector CT imaging of the cervical spine was performed without intravenous contrast. Multiplanar CT image reconstructions were also generated. RADIATION DOSE  REDUCTION: This exam was performed according to the departmental dose-optimization program which includes automated exposure control, adjustment of the mA and/or kV according to patient size and/or use of iterative reconstruction technique. COMPARISON:  Cervical spine CT dated 10/26/2020. FINDINGS: Alignment: No acute subluxation. Skull base and vertebrae: No acute fracture. Severe osteopenia. C4-T2 posterior fusion. The hardware is intact. Soft tissues and spinal canal: No prevertebral fluid or swelling. No visible canal hematoma. Disc levels: Multilevel degenerative changes. Anterior bridging osteophyte consistent with DISH. Upper chest: Negative. Other: None IMPRESSION: 1. No acute fracture or subluxation of the cervical spine. 2. C4-T2 posterior fusion. The hardware is intact. Electronically Signed   By: Anner Crete M.D.   On: 02/20/2021 21:52   US Venous Img Lower Bilateral  Result Date: 02/20/2021 CLINICAL DATA:  Bilateral lower extremity swelling.  Rule out DVT. EXAM: Bilateral LOWER EXTREMITY VENOUS DOPPLER ULTRASOUND TECHNIQUE: Gray-scale sonography with compression, as well as color and duplex ultrasound, were performed to evaluate the deep venous system(s) from the level of the common femoral vein through the popliteal and proximal calf veins. COMPARISON:  None. FINDINGS: VENOUS Normal compressibility of the common femoral, superficial femoral, and popliteal veins, as well as the visualized calf veins. Visualized portions of profunda femoral vein and great saphenous vein unremarkable. No filling defects to suggest DVT on grayscale or color Doppler imaging. Doppler waveforms show normal direction of venous flow, normal respiratory plasticity and response to augmentation. Limited views of the contralateral common femoral vein are unremarkable. OTHER None. Limitations: none IMPRESSION: Negative. Electronically Signed   By: Anner Crete M.D.   On: 02/20/2021 20:57   DG Chest Portable 1  View  Result Date: 02/20/2021 CLINICAL DATA:  Bilateral leg swelling.  Rule out heart failure EXAM: PORTABLE CHEST 1 VIEW COMPARISON:  09/19/2010 FINDINGS: Mild cardiac enlargement. Negative for heart failure or edema. Possible small left pleural effusion versus pleural scarring. 1 cm round density overlying the right lung base is not seen on the prior study. This could be within the chest or outside the chest Pedicle screw and rod fusion in the cervical and upper thoracic spine and in the lower thoracic spine. IMPRESSION: Cardiac enlargement without heart failure. Question 1 cm right lower lobe lung nodule. Recommend follow-up two-view chest x-ray. Electronically Signed   By: Franchot Gallo M.D.   On: 02/20/2021 19:19   ECHOCARDIOGRAM COMPLETE  Result Date: 02/21/2021    ECHOCARDIOGRAM REPORT   Patient Name:   Chris Kent Date of Exam: 02/21/2021 Medical Rec #:  [id number]        Height:       69.0 in Accession #:    [id number]       Weight:       221.4 lb Date of Birth:  10/01/1935  BSA:          2.157 m Patient Age:    86 years         BP:           Not listed in chart/Not listed in                                                chart mmHg Patient Gender: M                HR:           71 bpm. Exam Location:  [facility] Procedure: 2D Echo, Cardiac Doppler and Color Doppler Indications:     Dyspnea R06.00  History:         Patient has no prior history of Echocardiogram examinations.                  Stroke; Risk Factors:Hypertension and Dyslipidemia.  Sonographer:     Sherrie Sport Referring Phys:  [id number] AMY N COX Diagnosing Phys: Kate Sable MD  Sonographer Comments: Technically challenging study due to limited acoustic windows, no apical window and suboptimal subcostal window. IMPRESSIONS  1. Left ventricular ejection fraction, by estimation, is 55 to 60%. The left ventricle has normal function. The left ventricle has no regional wall motion abnormalities. There is mild left ventricular  hypertrophy. Left ventricular diastolic function could not be evaluated.  2. Right ventricular systolic function is normal. The right ventricular size is normal.  3. The mitral valve is normal in structure. No evidence of mitral valve regurgitation.  4. The aortic valve is tricuspid. Aortic valve regurgitation is not visualized.  5. The inferior vena cava is normal in size with greater than 50% respiratory variability, suggesting right atrial pressure of 3 mmHg. FINDINGS  Left Ventricle: Left ventricular ejection fraction, by estimation, is 55 to 60%. The left ventricle has normal function. The left ventricle has no regional wall motion abnormalities. The left ventricular internal cavity size was normal in size. There is  mild left ventricular hypertrophy. Left ventricular diastolic function could not be evaluated. Right Ventricle: The right ventricular size is normal. No increase in right ventricular wall thickness. Right ventricular systolic function is normal. Left Atrium: Left atrial size was normal in size. Right Atrium: Right atrial size was normal in size. Pericardium: Trivial pericardial effusion is present. Mitral Valve: The mitral valve is normal in structure. No evidence of mitral valve regurgitation. Tricuspid Valve: The tricuspid valve is grossly normal. Tricuspid valve regurgitation is not demonstrated. Aortic Valve: The aortic valve is tricuspid. Aortic valve regurgitation is not visualized. Pulmonic Valve: The pulmonic valve was normal in structure. Pulmonic valve regurgitation is trivial. Aorta: The aortic root is normal in size and structure. Venous: The inferior vena cava is normal in size with greater than 50% respiratory variability, suggesting right atrial pressure of 3 mmHg. IAS/Shunts: No atrial level shunt detected by color flow Doppler.  LEFT VENTRICLE PLAX 2D LVIDd:         5.10 cm LVIDs:         3.40 cm LV PW:         1.30 cm LV IVS:        1.20 cm LVOT diam:     2.10 cm LVOT Area:      3.46 cm  LEFT ATRIUM  Index LA diam:    4.30 cm 1.99 cm/m                        PULMONIC VALVE AORTA                 PV Vmax:          0.74 m/s Ao Root diam: 3.47 cm PV Vmean:         50.800 cm/s                       PV VTI:           0.187 m                       PV Peak grad:     2.2 mmHg                       PV Mean grad:     1.0 mmHg                       PR End Diast Vel: 6.35 msec                       RVOT Peak grad:   3 mmHg   SHUNTS Systemic Diam: 2.10 cm Pulmonic VTI:  0.175 m Kate Sable MD Electronically signed by Kate Sable MD Signature Date/Time: 02/21/2021/1:58:23 PM    Final       Labs: BNP (last 3 results) Recent Labs    11/22/20 0630 02/20/21 1848  BNP 36.8 37.1   Basic Metabolic Panel: Recent Labs  Lab 02/20/21 1848 02/21/21 0542  NA 141 135  K 3.5 3.1*  CL 102 99  CO2 29 30  GLUCOSE 112* 105*  BUN 23 19  CREATININE 1.02 0.88  CALCIUM 8.8* 8.3*   Liver Function Tests: Recent Labs  Lab 02/20/21 1848  AST 17  ALT 10  ALKPHOS 70  BILITOT 1.0  PROT 5.7*  ALBUMIN 3.4*   No results for input(s): LIPASE, AMYLASE in the last 168 hours. Recent Labs  Lab 02/20/21 2327  AMMONIA 20   CBC: Recent Labs  Lab 02/20/21 1848 02/21/21 0542  WBC 8.9 9.5  HGB 13.1 13.3  HCT 40.1 41.1  MCV 97.3 96.3  PLT 125* 118*   Cardiac Enzymes: No results for input(s): CKTOTAL, CKMB, CKMBINDEX, TROPONINI in the last 168 hours. BNP: Invalid input(s): POCBNP CBG: No results for input(s): GLUCAP in the last 168 hours. D-Dimer No results for input(s): DDIMER in the last 72 hours. Hgb A1c No results for input(s): HGBA1C in the last 72 hours. Lipid Profile No results for input(s): CHOL, HDL, LDLCALC, TRIG, CHOLHDL, LDLDIRECT in the last 72 hours. Thyroid function studies No results for input(s): TSH, T4TOTAL, T3FREE, THYROIDAB in the last 72 hours.  Invalid input(s): FREET3 Anemia work up No results for input(s): VITAMINB12, FOLATE, FERRITIN,  TIBC, IRON, RETICCTPCT in the last 72 hours. Urinalysis    Component Value Date/Time   COLORURINE YELLOW 02/20/2021 2155   APPEARANCEUR CLEAR 02/20/2021 2155   APPEARANCEUR Clear 11/21/2017 1011   LABSPEC 1.020 02/20/2021 2155   LABSPEC 1.013 09/20/2012 0122   PHURINE 7.5 02/20/2021 2155   GLUCOSEU NEGATIVE 02/20/2021 2155   GLUCOSEU Negative 09/20/2012 0122   HGBUR TRACE (A) 02/20/2021 2155   BILIRUBINUR NEGATIVE 02/20/2021 2155   BILIRUBINUR Negative  11/21/2017 1011   BILIRUBINUR Negative 09/20/2012 0122   KETONESUR NEGATIVE 02/20/2021 2155   PROTEINUR NEGATIVE 02/20/2021 2155   NITRITE NEGATIVE 02/20/2021 2155   LEUKOCYTESUR NEGATIVE 02/20/2021 2155   LEUKOCYTESUR Negative 09/20/2012 0122   Sepsis Labs Invalid input(s): PROCALCITONIN,  WBC,  LACTICIDVEN Microbiology Recent Results (from the past 240 hour(s))  Resp Panel by RT-PCR (Flu A&B, Covid) Urine, Clean Catch     Status: None   Collection Time: 02/20/21  9:55 PM   Specimen: Urine, Clean Catch; Nasopharyngeal(NP) swabs in vial transport medium  Result Value Ref Range Status   SARS Coronavirus 2 by RT PCR NEGATIVE NEGATIVE Final    Comment: (NOTE) SARS-CoV-2 target nucleic acids are NOT DETECTED.  The SARS-CoV-2 RNA is generally detectable in upper respiratory specimens during the acute phase of infection. The lowest concentration of SARS-CoV-2 viral copies this assay can detect is 138 copies/mL. A negative result does not preclude SARS-Cov-2 infection and should not be used as the sole basis for treatment or other patient management decisions. A negative result may occur with  improper specimen collection/handling, submission of specimen other than nasopharyngeal swab, presence of viral mutation(s) within the areas targeted by this assay, and inadequate number of viral copies(<138 copies/mL). A negative result must be combined with clinical observations, patient history, and epidemiological information. The  expected result is Negative.  Fact Sheet for Patients:  EntrepreneurPulse.com.au  Fact Sheet for Healthcare Providers:  IncredibleEmployment.be  This test is no t yet approved or cleared by the [place] FDA and  has been authorized for detection and/or diagnosis of SARS-CoV-2 by FDA under an Emergency Use Authorization (EUA). This EUA will remain  in effect (meaning this test can be used) for the duration of the COVID-19 declaration under Section 564(b)(1) of the Act, 21 U.S.C.section 360bbb-3(b)(1), unless the authorization is terminated  or revoked sooner.       Influenza A by PCR NEGATIVE NEGATIVE Final   Influenza B by PCR NEGATIVE NEGATIVE Final    Comment: (NOTE) The Xpert Xpress SARS-CoV-2/FLU/RSV plus assay is intended as an aid in the diagnosis of influenza from Nasopharyngeal swab specimens and should not be used as a sole basis for treatment. Nasal washings and aspirates are unacceptable for Xpert Xpress SARS-CoV-2/FLU/RSV testing.  Fact Sheet for Patients: EntrepreneurPulse.com.au  Fact Sheet for Healthcare Providers: IncredibleEmployment.be  This test is not yet approved or cleared by the [place] FDA and has been authorized for detection and/or diagnosis of SARS-CoV-2 by FDA under an Emergency Use Authorization (EUA). This EUA will remain in effect (meaning this test can be used) for the duration of the COVID-19 declaration under Section 564(b)(1) of the Act, 21 U.S.C. section 360bbb-3(b)(1), unless the authorization is terminated or revoked.  Performed at [facility], [address] [place], [place] [address]      Total time spend on discharging this patient, including the last patient exam, discussing the hospital stay, instructions for ongoing care as it relates to all pertinent caregivers, as well as preparing the medical discharge records, prescriptions, and/or  referrals as applicable, is 45 minutes.    Enzo Bi, MD  [facility] 02/21/2021, 2:27 PM

## 2021-02-21 NOTE — TOC Transition Note (Signed)
Transition of Care [facility]) - CM/SW Discharge Note   Patient Details  Name: TIYON SANOR MRN: [id number] Date of Birth: 1935-04-23  [facility] CM/SW Contact:  Shelbie Hutching, RN Phone Number: 02/21/2021, 2:32 PM   Clinical Narrative:    Patient medically cleared for discharge home with home health.  Patient has called his grandson to come and pick him up.       Barriers to Discharge: Continued Medical Work up   Patient Goals and CMS Choice Patient states their goals for this hospitalization and ongoing recovery are:: Patient wants to get back home- reports that he has help at home CMS Medicare.gov Compare Post Acute Care list provided to:: Patient Choice offered to / list presented to : Patient  Discharge Placement                       Discharge Plan and Services   Discharge Planning Services: CM Consult Post Acute Care Choice: Resumption of Svcs/PTA Provider, Home Health          DME Arranged: N/A DME Agency: NA       HH Arranged: RN, PT, OT [place] Agency: [place] ([place]) Date [place]: 02/21/21 Time [place]: 1419 Representative spoke with at [place]: [place] (SDOH) Interventions     Readmission Risk Interventions No flowsheet data found.

## 2021-02-21 NOTE — Progress Notes (Signed)
*  PRELIMINARY RESULTS* Echocardiogram 2D Echocardiogram has been performed.  Sherrie Sport 02/21/2021, 12:36 PM

## 2021-02-21 NOTE — Evaluation (Signed)
Physical Therapy Evaluation Patient Details Name: Chris Kent MRN: [id number] DOB: [birth year] Today's Date: 02/21/2021  History of Present Illness  86 year old male with history of CAD, hypertension, hyperlipidemia, insomnia, OSA, anxiety, who presents emergency department for chief concerns of confusion and worsening shortness of breath  Clinical Impression  Pt received supine in bed, agreeable to therapy. Difficult to understand - asked pt to repeat multiple phrases to ensure proper understanding. Pt grandson is available to stay with him at home as long as pt needs. Pt performed bed mobility and STS with SUP. Ambulation performed with cane, CGA for safety however no steadying was required. SpO2 during mobility did decline however pt was asymptomatic - PT believes reading to be false. He did decline to 84% after ambulation however recovered to the mid-90's within 2 minutes of guided breathing. PT rec HHPT to address stability during ambulation and general endurance. Would benefit from skilled PT to address above deficits and promote optimal return to PLOF.       Recommendations for follow up therapy are one component of a multi-disciplinary discharge planning process, led by the attending physician.  Recommendations may be updated based on patient status, additional functional criteria and insurance authorization.  Follow Up Recommendations Home health PT    Assistance Recommended at Discharge PRN  Patient can return home with the following  A little help with bathing/dressing/bathroom;Help with stairs or ramp for entrance;Assistance with cooking/housework    Equipment Recommendations None recommended by PT  Recommendations for Other Services       Functional Status Assessment Patient has had a recent decline in their functional status and demonstrates the ability to make significant improvements in function in a reasonable and predictable amount of time.     Precautions /  Restrictions Precautions Precautions: Fall Restrictions Weight Bearing Restrictions: No      Mobility  Bed Mobility Overal bed mobility: Needs Assistance Bed Mobility: Supine to Sit, Sit to Supine     Supine to sit: Supervision Sit to supine: Supervision        Transfers Overall transfer level: Needs assistance Equipment used: Straight cane Transfers: Sit to/from Stand Sit to Stand: Supervision                Ambulation/Gait Ambulation/Gait assistance: Min guard Gait Distance (Feet): 150 Feet Assistive device: Straight cane Gait Pattern/deviations: Step-through pattern, Wide base of support Gait velocity: decreased     General Gait Details: CGA for safety, no steadying provided. SpO2 decreased to 69% - believe false reading due to tight grip on cane, pt asymptomatic. Once seated, SpO2 rose to 84 and hovered for a minute before climbing into the 90's.  Stairs            Wheelchair Mobility    Modified Rankin (Stroke Patients Only)       Balance Overall balance assessment: Needs assistance Sitting-balance support: No upper extremity supported, Feet supported Sitting balance-Leahy Scale: Good     Standing balance support: Single extremity supported, During functional activity Standing balance-Leahy Scale: Fair Standing balance comment: SPC to ambulate.                             Pertinent Vitals/Pain Pain Assessment Pain Assessment: No/denies pain    Home Living Family/patient expects to be discharged to:: Private residence Living Arrangements: Alone Available Help at Discharge: Family;Available 24 hours/day Type of Home: House Home Access: Stairs to enter Entrance Stairs-Rails: None Entrance Stairs-Number of  Steps: 1   Home Layout: One level Home Equipment: Conservation officer, nature (2 wheels);Tub bench;Hand held shower head;Cane - single point      Prior Function Prior Level of Function : Independent/Modified Independent;History of  Falls (last six months)             Mobility Comments: 4 falls in the last 6 months related to slipping or tripping. Independent for all activities and mobility. Is not driving at this time. ADLs Comments: Independent     Hand Dominance        Extremity/Trunk Assessment   Upper Extremity Assessment Upper Extremity Assessment: Overall WFL for tasks assessed;Generalized weakness    Lower Extremity Assessment Lower Extremity Assessment: Overall WFL for tasks assessed (5/5 BLE)       Communication   Communication: No difficulties  Cognition Arousal/Alertness: Awake/alert Behavior During Therapy: WFL for tasks assessed/performed Overall Cognitive Status: Within Functional Limits for tasks assessed                                          General Comments      Exercises     Assessment/Plan    PT Assessment All further PT needs can be met in the next venue of care  PT Problem List Decreased mobility;Decreased balance;Decreased strength       PT Treatment Interventions      PT Goals (Current goals can be found in the Care Plan section)  Acute Rehab PT Goals Patient Stated Goal: to go home PT Goal Formulation: With patient    Frequency       Co-evaluation               AM-PAC PT "6 Clicks" Mobility  Outcome Measure Help needed turning from your back to your side while in a flat bed without using bedrails?: None Help needed moving from lying on your back to sitting on the side of a flat bed without using bedrails?: A Little Help needed moving to and from a bed to a chair (including a wheelchair)?: A Little Help needed standing up from a chair using your arms (e.g., wheelchair or bedside chair)?: A Little Help needed to walk in hospital room?: A Little Help needed climbing 3-5 steps with a railing? : A Little 6 Click Score: 19    End of Session Equipment Utilized During Treatment: Gait belt Activity Tolerance: Patient tolerated  treatment well Patient left: in bed;with call bell/phone within reach Nurse Communication: Mobility status PT Visit Diagnosis: Unsteadiness on feet (R26.81);Difficulty in walking, not elsewhere classified (R26.2);History of falling (Z91.81)    Time: 2876-8115 PT Time Calculation (min) (ACUTE ONLY): 25 min   Charges:   PT Evaluation $PT Eval Moderate Complexity: 1 Mod PT Treatments $Therapeutic Exercise: 8-22 mins        Patrina Levering PT, DPT 02/21/21 4:05 PM [phone]

## 2021-02-21 NOTE — TOC Initial Note (Signed)
Transition of Care [facility]) - Initial/Assessment Note    Patient Details  Name: Chris Kent MRN: [id number] Date of Birth: 1935/09/08  Transition of [facility] CM/SW Contact:    Shelbie Hutching, RN Phone Number: 02/21/2021, 2:20 PM  Clinical Narrative:                 Patient placed under observation for CHF exacerbation.  RNCM met with patient at the bedside, introduced self and explained role.   Patient is from home where he lives alone.  He reports that his grandson and his wife will be able to stay with the patient and help him out as long as he needs it.  He gets around with a cane, he also has a walker at home.  He fell yesterday, reports that the cane slipped because of a slippery surface.  Patient reports he has not driven lately his grandson can provide his transportation.  He is open with [facility] for PT only.  Corene Cornea with [facility] notified that patient will likely discharge today and that we will add RN and OT to the home health orders.  Patient reports that his daughter or grandson will pick him up.    Expected Discharge Plan: [place] Barriers to Discharge: Continued Medical Work up   Patient Goals and CMS Choice Patient states their goals for this hospitalization and ongoing recovery are:: Patient wants to get back home- reports that he has help at home CMS Medicare.gov Compare Post Acute Care list provided to:: Patient Choice offered to / list presented to : Patient  Expected Discharge Plan and Services Expected Discharge Plan: [place]   Discharge Planning Services: CM Consult Post Acute Care Choice: Resumption of Svcs/PTA Provider, Home Health Living arrangements for the past 2 months: Single Family Home                 DME Arranged: N/A DME Agency: NA       HH Arranged: RN, PT, OT [place] Agency: [place] ([place]) Date HH Agency Contacted: 02/21/21 Time [place]: 1419 Representative spoke with at [place]: Floydene Flock  Prior Living Arrangements/Services Living arrangements for the past 2 months: [place] with:: Self Patient language and need for interpreter reviewed:: Yes Do you feel safe going back to the place where you live?: Yes      Need for Family Participation in Patient Care: Yes (Comment) Care giver support system in place?: Yes (comment) (grandson and daughter) Current home services: DME, Home PT (cane, walker) Criminal Activity/Legal Involvement Pertinent to Current Situation/Hospitalization: No - Comment as needed  Activities of Daily Living      Permission Sought/Granted Permission sought to share information with : Case Manager, Family Supports, Other (comment) Permission granted to share information with : Yes, Verbal Permission Granted  Share Information with NAME: June [place] granted to share info w AGENCY: [facility]  Permission granted to share info w Relationship: daughter  Permission granted to share info w Contact Information: [phone]  Emotional Assessment Appearance:: Appears stated age Attitude/Demeanor/Rapport: Engaged Affect (typically observed): Accepting Orientation: : Oriented to Self, Oriented to Place, Oriented to  Time, Oriented to Situation Alcohol / Substance Use: Not Applicable Psych Involvement: No (comment)  Admission diagnosis:  Acute exacerbation of CHF (congestive heart failure) (HCC) [I50.9] Patient Active Problem List   Diagnosis Date Noted   Acute exacerbation of CHF (congestive heart failure) ([place]) 02/20/2021   CAD (  coronary artery disease) 02/20/2021   Unstable burst fracture of t11-T12 vertebra, subsequent encounter for fracture with delayed healing    Fracture, thoracic vertebra ([place]) 11/22/2020   History of stroke    Asthma    Fall 10/26/2020   Erectile dysfunction due to arterial insufficiency 06/29/2019   Nephrolithiasis 06/25/2018   Anxiety 11/21/2017   Asthma without status  asthmaticus 11/21/2017   Esophagitis 11/21/2017   Glaucoma 11/21/2017   History of colonic polyps 11/21/2017   Hyperglycemia 11/21/2017   Hypertension 11/21/2017   Osteoarthritis 11/21/2017   Thyroid nodule 11/21/2017   Transient global amnesia 11/21/2017   Status post total bilateral knee replacement 01/12/2017   Essential hypertension, benign 03/05/2016   Hyperlipidemia 03/05/2016   Carotid stenosis 03/05/2016   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   Thrombocytopenia ([place]) 03/03/2014   Obstructive sleep apnea hypopnea, moderate 06/14/2013   Pulmonary nodules/lesions, multiple 06/14/2013   Renal cyst, acquired 04/06/2013   Enlarged prostate with lower urinary tract symptoms (LUTS) 03/18/2012   Erectile dysfunction 03/18/2012   Increased frequency of urination 03/18/2012   Nocturia 03/18/2012   PCP:  Idelle Crouch, MD Pharmacy:   [facility] [place], [address] AT [address] [place] [address] Phone: [phone] Fax: [phone]     Social Determinants of Health (SDOH) Interventions    Readmission Risk Interventions No flowsheet data found.

## 2021-02-24 ENCOUNTER — Encounter: Payer: Self-pay | Admitting: Emergency Medicine

## 2021-02-24 ENCOUNTER — Other Ambulatory Visit: Payer: Self-pay

## 2021-02-24 ENCOUNTER — Emergency Department: Payer: PPO

## 2021-02-24 ENCOUNTER — Observation Stay
Admission: EM | Admit: 2021-02-24 | Discharge: 2021-02-26 | Disposition: A | Discharge status: Home / Self Care | Payer: PPO | Attending: Internal Medicine | Admitting: Internal Medicine

## 2021-02-24 DIAGNOSIS — E538 Deficiency of other specified B group vitamins: Secondary | ICD-10-CM

## 2021-02-24 DIAGNOSIS — M25522 Pain in left elbow: Secondary | ICD-10-CM | POA: Diagnosis not present

## 2021-02-24 DIAGNOSIS — J90 Pleural effusion, not elsewhere classified: Secondary | ICD-10-CM | POA: Diagnosis not present

## 2021-02-24 DIAGNOSIS — R911 Solitary pulmonary nodule: Secondary | ICD-10-CM | POA: Diagnosis not present

## 2021-02-24 DIAGNOSIS — I2609 Other pulmonary embolism with acute cor pulmonale: Secondary | ICD-10-CM

## 2021-02-24 DIAGNOSIS — R0602 Shortness of breath: Secondary | ICD-10-CM | POA: Insufficient documentation

## 2021-02-24 DIAGNOSIS — K310 Acute dilatation of stomach: Secondary | ICD-10-CM

## 2021-02-24 DIAGNOSIS — Z20822 Contact with and (suspected) exposure to covid-19: Secondary | ICD-10-CM | POA: Diagnosis not present

## 2021-02-24 DIAGNOSIS — S0990XA Unspecified injury of head, initial encounter: Secondary | ICD-10-CM | POA: Diagnosis not present

## 2021-02-24 DIAGNOSIS — Z8673 Personal history of transient ischemic attack (TIA), and cerebral infarction without residual deficits: Secondary | ICD-10-CM

## 2021-02-24 DIAGNOSIS — R0600 Dyspnea, unspecified: Secondary | ICD-10-CM | POA: Insufficient documentation

## 2021-02-24 DIAGNOSIS — J449 Chronic obstructive pulmonary disease, unspecified: Secondary | ICD-10-CM | POA: Diagnosis not present

## 2021-02-24 DIAGNOSIS — R600 Localized edema: Secondary | ICD-10-CM

## 2021-02-24 DIAGNOSIS — Z9049 Acquired absence of other specified parts of digestive tract: Secondary | ICD-10-CM | POA: Diagnosis not present

## 2021-02-24 DIAGNOSIS — I2699 Other pulmonary embolism without acute cor pulmonale: Principal | ICD-10-CM | POA: Insufficient documentation

## 2021-02-24 DIAGNOSIS — R410 Disorientation, unspecified: Secondary | ICD-10-CM

## 2021-02-24 DIAGNOSIS — R918 Other nonspecific abnormal finding of lung field: Secondary | ICD-10-CM

## 2021-02-24 DIAGNOSIS — I10 Essential (primary) hypertension: Secondary | ICD-10-CM | POA: Diagnosis not present

## 2021-02-24 DIAGNOSIS — J9811 Atelectasis: Secondary | ICD-10-CM | POA: Diagnosis not present

## 2021-02-24 DIAGNOSIS — N400 Enlarged prostate without lower urinary tract symptoms: Secondary | ICD-10-CM

## 2021-02-24 DIAGNOSIS — S22001S Stable burst fracture of unspecified thoracic vertebra, sequela: Secondary | ICD-10-CM

## 2021-02-24 DIAGNOSIS — I2510 Atherosclerotic heart disease of native coronary artery without angina pectoris: Secondary | ICD-10-CM | POA: Insufficient documentation

## 2021-02-24 DIAGNOSIS — R0902 Hypoxemia: Secondary | ICD-10-CM | POA: Diagnosis not present

## 2021-02-24 DIAGNOSIS — G9341 Metabolic encephalopathy: Secondary | ICD-10-CM

## 2021-02-24 DIAGNOSIS — R4182 Altered mental status, unspecified: Secondary | ICD-10-CM

## 2021-02-24 DIAGNOSIS — M19012 Primary osteoarthritis, left shoulder: Secondary | ICD-10-CM | POA: Diagnosis not present

## 2021-02-24 DIAGNOSIS — R0609 Other forms of dyspnea: Secondary | ICD-10-CM | POA: Diagnosis not present

## 2021-02-24 DIAGNOSIS — J811 Chronic pulmonary edema: Secondary | ICD-10-CM | POA: Diagnosis not present

## 2021-02-24 DIAGNOSIS — I5032 Chronic diastolic (congestive) heart failure: Secondary | ICD-10-CM

## 2021-02-24 LAB — TROPONIN I (HIGH SENSITIVITY)
Troponin I (High Sensitivity): 21 ng/L — ABNORMAL HIGH (ref <18)
Troponin I (High Sensitivity): 21 ng/L — ABNORMAL HIGH (ref <18)

## 2021-02-24 LAB — BASIC METABOLIC PANEL
Anion gap: 7 (ref 5–15)
BUN: 25 mg/dL — ABNORMAL HIGH (ref 8–23)
CO2: 29 mmol/L (ref 22–32)
Calcium: 8.5 mg/dL — ABNORMAL LOW (ref 8.9–10.3)
Chloride: 104 mmol/L (ref 98–111)
Creatinine, Ser: 0.78 mg/dL (ref 0.61–1.24)
GFR, Estimated: >60 mL/min (ref >60)
Glucose, Bld: 115 mg/dL — ABNORMAL HIGH (ref 70–99)
Potassium: 3.2 mmol/L — ABNORMAL LOW (ref 3.5–5.1)
Sodium: 140 mmol/L (ref 135–145)

## 2021-02-24 LAB — CBC
HCT: 42.4 % (ref 39.0–52.0)
Hemoglobin: 13.6 g/dL (ref 13.0–17.0)
MCH: 31.6 pg (ref 26.0–34.0)
MCHC: 32.1 g/dL (ref 30.0–36.0)
MCV: 98.4 fL (ref 80.0–100.0)
Platelets: 117 10*3/uL — ABNORMAL LOW (ref 150–400)
RBC: 4.31 MIL/uL (ref 4.22–5.81)
RDW: 13.2 % (ref 11.5–15.5)
WBC: 7.1 10*3/uL (ref 4.0–10.5)
nRBC: 0 % (ref 0.0–0.2)

## 2021-02-24 LAB — BRAIN NATRIURETIC PEPTIDE: B Natriuretic Peptide: 104.5 pg/mL — ABNORMAL HIGH (ref 0.0–100.0)

## 2021-02-24 LAB — D-DIMER, QUANTITATIVE: D-Dimer, Quant: 1.63 ug/mL-FEU — ABNORMAL HIGH (ref 0.00–0.50)

## 2021-02-24 NOTE — ED Notes (Addendum)
Pt here for return of bilateral leg swelling/edema.Pt also had fall and complains of moderate pain in left shoulder and elbow but denies any pain in head. Pt denies SOB currently and at rest.  Pt was here Tuesday of this week and was hospitalized and per daughter, responded well to Lasix and they were able to get a significant amount of fluid off. Daughter reports pt has only had a few large urinations during the week and is concerned about amount of fluid. Per daughter, pt still has SOB with walking/exertion. Pt also had AMS earlier in week including confusion and hallucinations. Daughter reports confusion is still present but no longer hallucinating. Pt alert and oriented x 4 for nurse upon assessment. GCS 15.  Pt has 1-2+ bilateral leg and foot edema but no arm edema present. Pt c/o pressure in legs but denies pain at this time.  Pt also had back surgery- fusion for fracture a few months ago (approx. October) after a fall.  Family at bedside (grandson [place] and wife- 24 hour caregivers) also asking about if pt can eat- let them know that would be up to the doctor after his assessment and treatment plan recommendation.

## 2021-02-24 NOTE — ED Triage Notes (Signed)
Pt via POV from home. Pt c/o bilateral swelling, pt was seen and d/c on 1/18 for bilateral leg swelling, caretaker states that he has been taking fluid pill with no relief. Pt is now SOB with exertion, pt has not been dx with CHF as of yet. Pt also had a fall and c/o L elbow and shoulder. Denies head injury but endorses Plavix. Pt is A&OX4 and NAD.

## 2021-02-24 NOTE — ED Provider Notes (Addendum)
[facility] Provider Note    Event Date/Time   First MD Initiated Contact with Patient 02/24/21 1943     (approximate)   History   Altered Mental Status   HPI Chris Kent is a 86 y.o. male who comes in with his family.  They report he was here several days ago for leg swelling up to his waist and got some Lasix.  This initially helped but now the swelling is back.  Now he is short of breath with exertion.  Shortness of breath is been coming and going for some time.  Today he fell and hit his elbow and shoulder.  He may have hit his head.  He has fusions in his neck and back but has no neck or back pain.  His shoulder has good range of motion now his elbow is moving fairly well as well but it is also swollen.      Physical Exam   Triage Vital Signs: ED Triage Vitals  Enc Vitals Group     BP 02/24/21 1349 (!) 120/58     Pulse Rate 02/24/21 1349 62     Resp 02/24/21 1349 18     Temp 02/24/21 1349 98.1 F (36.7 C)     Temp Source 02/24/21 1349 Oral     SpO2 02/24/21 1349 99 %     Weight 02/24/21 1346 220 lb (99.8 kg)     Height 02/24/21 1346 5\' 9"  (1.753 m)     Head Circumference --      Peak Flow --      Pain Score 02/24/21 1346 0     Pain Loc --      Pain Edu? --      Excl. in [place]? --     Most recent vital signs: Vitals:   02/24/21 2200 02/24/21 2300  BP: 119/61 116/60  Pulse: 73 80  Resp: 20 16  Temp:    SpO2: 98% 97%     General: Awake, alert, no distress.  CV:  Good peripheral perfusion.  Heart regular rate and rhythm no audible murmurs Resp:  Normal effort.  Crackles in the bases bilaterally no retraction Abd:  No distention.  Soft nontender Extremities: Bilateral edema.  Not tender not red   ED Results / Procedures / Treatments   Labs (all labs ordered are listed, but only abnormal results are displayed) Labs Reviewed  BASIC METABOLIC PANEL - Abnormal; Notable for the following components:      Result Value   Potassium  3.2 (*)    Glucose, Bld 115 (*)    BUN 25 (*)    Calcium 8.5 (*)    All other components within normal limits  CBC - Abnormal; Notable for the following components:   Platelets 117 (*)    All other components within normal limits  BRAIN NATRIURETIC PEPTIDE - Abnormal; Notable for the following components:   B Natriuretic Peptide 104.5 (*)    All other components within normal limits  D-DIMER, QUANTITATIVE - Abnormal; Notable for the following components:   D-Dimer, Quant 1.63 (*)    All other components within normal limits  BLOOD GAS, ARTERIAL - Abnormal; Notable for the following components:   pH, Arterial 7.46 (*)    Bicarbonate 31.3 (*)    Acid-Base Excess 6.6 (*)    All other components within normal limits  TROPONIN I (HIGH SENSITIVITY) - Abnormal; Notable for the following components:   Troponin I (High Sensitivity) 21 (*)  All other components within normal limits  TROPONIN I (HIGH SENSITIVITY) - Abnormal; Notable for the following components:   Troponin I (High Sensitivity) 21 (*)    All other components within normal limits  RESP PANEL BY RT-PCR (FLU A&B, COVID) [id number]     EKG  EKG read interpreted by me shows normal sinus rhythm rate of 60 left axis right bundle branch block.  No acute changes looks similar to EKG from the 17th.   RADIOLOGY Chest x-ray read by radiology reviewed by me does show some mild pulmonary vascular congestion.  Shoulder x-ray does not show any acute fractures or some DJD as radiologist read.  Elbow shows a large effusion as radiologist read.  Patient is moving his elbow very well without pain.  I may put him in a sling.   PROCEDURES:  Critical Care performed: 30 minutes.  This includes evaluating the patient reviewing his studies and some of his old records EKGs.  Eventually I spoke with the patient's family several times even showing them his lab work.  Procedures   MEDICATIONS ORDERED IN ED: Medications  iohexol (OMNIPAQUE) 350  MG/ML injection 80 mL (80 mLs Intravenous Contrast Given 02/25/21 0104)     IMPRESSION / MDM / ASSESSMENT AND PLAN / ED COURSE  I reviewed the triage vital signs and the nursing notes.                   Patient's O2 sats dropped to 88 while he is lying in bed heart rate dropped to 55 good waveform.          Patient has a lot of swelling around the elbow but good range of motion of the elbow and he says its not really hurting.  I do not see an anterior fat pad on this gentleman.  There may be a little bit of a posterior fat pad.  I am not sure he has an occult fracture I do not think he has an occult fracture based on how he is moving the elbow but I will put him in a sling to try to protect the elbow slightly without inconveniencing his movement. Family is reporting he seems to be more confused but when I speak with him and the nurse speaks with him he does not seem to be really any different than when he got here.  Of course his family knows him better.  I have ordered head CT and CT angio since his D-dimer is elevated and some of his shortness of breath is intermittent.  Once we get the CT back if its okay I will give him some Lasix.  We will plan on admitting him because of his failure of outpatient treatment and occasional drops of O2 sat below 90 while remaining still in the bed.  He is awake when these happen.  They are not sleep apnea.  He has a good waveform on the monitor. The patient is on the cardiac monitor to evaluate for evidence of arrhythmia and/or significant heart rate changes.  Have been seen.  Patient's CBC and electrolytes are okay.  His D-dimer is elevated.  This together with the occasional intermittent quality of his shortness of breath makes me concerned that he may have a DVT.  His ultrasound of his legs was negative previously.  CT of the head was negative previously as well but he has fallen again.  Patient had an echo on the 18th which looked essentially okay.  But he does  have  edema and a chest x-ray that looks like possibly CHF.  His COVID is negative. I am going to sign him out to Dr. Alfred Levins.  I have left a written message for the hospitalist.  He have to get the CT angio and head CT back first.  And then possibly give him some more Lasix but I believe he should be admitted because he does seem to have failed outpatient management.     FINAL CLINICAL IMPRESSION(S) / ED DIAGNOSES   Final diagnoses:  Dyspnea, unspecified type  Hypoxia  Altered mental status, unspecified altered mental status type     Rx / DC Orders   ED Discharge Orders     None        Note:  This document was prepared using Dragon voice recognition software and may include unintentional dictation errors.   Nena Polio, MD 02/25/21 0121 At 0125 AM I saw the  patient in CT. He was alert ox3. Got off ct table by himself. And onto the stretcher w/ hel[p.   [place] is not working now.   Nena Polio, MD 02/25/21 [phone]

## 2021-02-25 ENCOUNTER — Inpatient Hospital Stay: Payer: PPO

## 2021-02-25 ENCOUNTER — Emergency Department: Payer: PPO

## 2021-02-25 DIAGNOSIS — I2609 Other pulmonary embolism with acute cor pulmonale: Secondary | ICD-10-CM | POA: Diagnosis not present

## 2021-02-25 DIAGNOSIS — S0990XA Unspecified injury of head, initial encounter: Secondary | ICD-10-CM | POA: Diagnosis not present

## 2021-02-25 DIAGNOSIS — Z9049 Acquired absence of other specified parts of digestive tract: Secondary | ICD-10-CM | POA: Diagnosis not present

## 2021-02-25 DIAGNOSIS — R140 Abdominal distension (gaseous): Secondary | ICD-10-CM | POA: Diagnosis not present

## 2021-02-25 DIAGNOSIS — I2699 Other pulmonary embolism without acute cor pulmonale: Secondary | ICD-10-CM | POA: Diagnosis not present

## 2021-02-25 DIAGNOSIS — R0609 Other forms of dyspnea: Secondary | ICD-10-CM | POA: Diagnosis not present

## 2021-02-25 DIAGNOSIS — R911 Solitary pulmonary nodule: Secondary | ICD-10-CM | POA: Diagnosis not present

## 2021-02-25 DIAGNOSIS — J449 Chronic obstructive pulmonary disease, unspecified: Secondary | ICD-10-CM

## 2021-02-25 DIAGNOSIS — R410 Disorientation, unspecified: Secondary | ICD-10-CM

## 2021-02-25 LAB — BLOOD GAS, ARTERIAL
Acid-Base Excess: 6.6 mmol/L — ABNORMAL HIGH (ref 0.0–2.0)
Bicarbonate: 31.3 mmol/L — ABNORMAL HIGH (ref 20.0–28.0)
FIO2: 0.21
O2 Saturation: 96.8 %
Patient temperature: 37
pCO2 arterial: 44 mmHg (ref 32.0–48.0)
pH, Arterial: 7.46 — ABNORMAL HIGH (ref 7.350–7.450)
pO2, Arterial: 84 mmHg (ref 83.0–108.0)

## 2021-02-25 LAB — URINALYSIS, COMPLETE (UACMP) WITH MICROSCOPIC
Bacteria, UA: NONE SEEN
Bilirubin Urine: NEGATIVE
Glucose, UA: NEGATIVE mg/dL
Ketones, ur: NEGATIVE mg/dL
Leukocytes,Ua: NEGATIVE
Nitrite: NEGATIVE
Protein, ur: NEGATIVE mg/dL
Specific Gravity, Urine: 1.026 (ref 1.005–1.030)
Squamous Epithelial / HPF: NONE SEEN (ref 0–5)
pH: 6 (ref 5.0–8.0)

## 2021-02-25 LAB — PROTIME-INR
INR: 1.2 (ref 0.8–1.2)
Prothrombin Time: 15.1 seconds (ref 11.4–15.2)

## 2021-02-25 LAB — RESP PANEL BY RT-PCR (FLU A&B, COVID) ARPGX2
Influenza A by PCR: NEGATIVE
Influenza B by PCR: NEGATIVE
SARS Coronavirus 2 by RT PCR: NEGATIVE

## 2021-02-25 LAB — APTT: aPTT: 31 seconds (ref 24–36)

## 2021-02-25 MED ORDER — LATANOPROST 0.005 % OP SOLN
1.0000 [drp] | Freq: Every day | OPHTHALMIC | Status: DC
  Administered 2021-02-25: 1 [drp] via OPHTHALMIC
  Filled 2021-02-25: qty 2.5

## 2021-02-25 MED ORDER — HYDROCODONE-ACETAMINOPHEN 5-325 MG PO TABS
1.0000 | ORAL_TABLET | ORAL | Status: DC | PRN
  Administered 2021-02-25: 2 via ORAL
  Filled 2021-02-25: qty 2

## 2021-02-25 MED ORDER — ACETAMINOPHEN 650 MG RE SUPP: 650.0000 mg | Freq: Four times a day (QID) | RECTAL | Status: DC | PRN

## 2021-02-25 MED ORDER — FUROSEMIDE 40 MG PO TABS: 40.0000 mg | ORAL_TABLET | Freq: Every day | ORAL | Status: DC

## 2021-02-25 MED ORDER — PANTOPRAZOLE SODIUM 40 MG PO TBEC: 40.0000 mg | DELAYED_RELEASE_TABLET | Freq: Every day | ORAL | Status: DC | PRN

## 2021-02-25 MED ORDER — TAMSULOSIN HCL 0.4 MG PO CAPS
0.8000 mg | ORAL_CAPSULE | Freq: Every day | ORAL | Status: DC
  Administered 2021-02-25: 0.8 mg via ORAL
  Filled 2021-02-25: qty 2

## 2021-02-25 MED ORDER — FUROSEMIDE 10 MG/ML IJ SOLN
40.0000 mg | Freq: Two times a day (BID) | INTRAMUSCULAR | Status: DC
  Administered 2021-02-25 – 2021-02-26 (×3): 40 mg via INTRAVENOUS
  Filled 2021-02-25 (×3): qty 4

## 2021-02-25 MED ORDER — HEPARIN BOLUS VIA INFUSION
5000.0000 [IU] | Freq: Once | INTRAVENOUS | Status: AC
  Administered 2021-02-25: 5000 [IU] via INTRAVENOUS
  Filled 2021-02-25: qty 5000

## 2021-02-25 MED ORDER — METOPROLOL SUCCINATE ER 100 MG PO TB24
100.0000 mg | ORAL_TABLET | Freq: Every day | ORAL | Status: DC
  Administered 2021-02-25 – 2021-02-26 (×2): 100 mg via ORAL
  Filled 2021-02-25 (×2): qty 1

## 2021-02-25 MED ORDER — DORZOLAMIDE HCL-TIMOLOL MAL PF 22.3-6.8 MG/ML OP SOLN
1.0000 [drp] | Freq: Two times a day (BID) | OPHTHALMIC | Status: DC
  Administered 2021-02-25 – 2021-02-26 (×3): 1 [drp] via OPHTHALMIC
  Filled 2021-02-25: qty 0.1

## 2021-02-25 MED ORDER — LACTATED RINGERS IV BOLUS
1000.0000 mL | Freq: Once | INTRAVENOUS | Status: AC
  Administered 2021-02-25: 1000 mL via INTRAVENOUS

## 2021-02-25 MED ORDER — SENNOSIDES-DOCUSATE SODIUM 8.6-50 MG PO TABS
2.0000 | ORAL_TABLET | Freq: Two times a day (BID) | ORAL | Status: DC
  Administered 2021-02-25 – 2021-02-26 (×3): 2 via ORAL
  Filled 2021-02-25 (×3): qty 2

## 2021-02-25 MED ORDER — LOSARTAN POTASSIUM 50 MG PO TABS
50.0000 mg | ORAL_TABLET | Freq: Two times a day (BID) | ORAL | Status: DC
  Administered 2021-02-25 – 2021-02-26 (×3): 50 mg via ORAL
  Filled 2021-02-25 (×3): qty 1

## 2021-02-25 MED ORDER — HEPARIN (PORCINE) 25000 UT/250ML-% IV SOLN
1600.0000 [IU]/h | INTRAVENOUS | Status: DC
  Administered 2021-02-25: 1600 [IU]/h via INTRAVENOUS
  Filled 2021-02-25: qty 250

## 2021-02-25 MED ORDER — LACTATED RINGERS IV SOLN: INTRAVENOUS | Status: DC

## 2021-02-25 MED ORDER — SODIUM CHLORIDE 0.9 % IV SOLN: INTRAVENOUS | Status: DC

## 2021-02-25 MED ORDER — IOHEXOL 350 MG/ML SOLN
80.0000 mL | Freq: Once | INTRAVENOUS | Status: AC | PRN
  Administered 2021-02-25: 80 mL via INTRAVENOUS

## 2021-02-25 MED ORDER — APIXABAN 5 MG PO TABS: 5.0000 mg | ORAL_TABLET | Freq: Two times a day (BID) | ORAL | Status: DC

## 2021-02-25 MED ORDER — ACETAMINOPHEN 325 MG PO TABS
650.0000 mg | ORAL_TABLET | Freq: Four times a day (QID) | ORAL | Status: DC | PRN
  Administered 2021-02-26: 650 mg via ORAL
  Filled 2021-02-25: qty 2

## 2021-02-25 MED ORDER — ROSUVASTATIN CALCIUM 10 MG PO TABS
10.0000 mg | ORAL_TABLET | Freq: Every day | ORAL | Status: DC
  Administered 2021-02-25: 10 mg via ORAL
  Filled 2021-02-25: qty 1

## 2021-02-25 MED ORDER — TRIAMCINOLONE ACETONIDE 0.1 % EX CREA
1.0000 "application " | TOPICAL_CREAM | Freq: Three times a day (TID) | CUTANEOUS | Status: DC
  Administered 2021-02-25 – 2021-02-26 (×4): 1 via TOPICAL
  Filled 2021-02-25: qty 15

## 2021-02-25 MED ORDER — LORATADINE 10 MG PO TABS
10.0000 mg | ORAL_TABLET | Freq: Every day | ORAL | Status: DC
  Administered 2021-02-25 – 2021-02-26 (×2): 10 mg via ORAL
  Filled 2021-02-25 (×2): qty 1

## 2021-02-25 MED ORDER — CLOPIDOGREL BISULFATE 75 MG PO TABS: 75.0000 mg | ORAL_TABLET | Freq: Every day | ORAL | Status: DC

## 2021-02-25 MED ORDER — APIXABAN 5 MG PO TABS
10.0000 mg | ORAL_TABLET | Freq: Two times a day (BID) | ORAL | Status: DC
  Administered 2021-02-25 – 2021-02-26 (×3): 10 mg via ORAL
  Filled 2021-02-25 (×3): qty 2

## 2021-02-25 MED ORDER — POTASSIUM CHLORIDE CRYS ER 20 MEQ PO TBCR
40.0000 meq | EXTENDED_RELEASE_TABLET | Freq: Two times a day (BID) | ORAL | Status: DC
  Administered 2021-02-25 – 2021-02-26 (×3): 40 meq via ORAL
  Filled 2021-02-25 (×3): qty 2

## 2021-02-25 MED ORDER — MORPHINE SULFATE (PF) 2 MG/ML IV SOLN: 2.0000 mg | INTRAVENOUS | Status: DC | PRN

## 2021-02-25 NOTE — Progress Notes (Signed)
Size 3x TED hose applied.  Patient states they feel comfortable.

## 2021-02-25 NOTE — Progress Notes (Signed)
ANTICOAGULATION CONSULT NOTE - Initial Consult  Pharmacy Consult for Apixaban Indication: pulmonary embolus  No Known Allergies  Patient Measurements: Height: 5\' 9"  (175.3 cm) Weight: 100.8 kg (222 lb 3.6 oz) IBW/kg (Calculated) : 70.7 Heparin Dosing Weight:  91.8 kg   Vital Signs: Temp: 97.9 F (36.6 C) (01/22 1135) Temp Source: Oral (01/22 1135) BP: 129/67 (01/22 1135) Pulse Rate: 73 (01/22 1135)  Labs: Recent Labs    02/24/21 1348 02/24/21 1637 02/25/21 0252  HGB 13.6  --   --   HCT 42.4  --   --   PLT 117*  --   --   APTT  --   --  31  LABPROT  --   --  15.1  INR  --   --  1.2  CREATININE 0.78  --   --   TROPONINIHS 21* 21*  --      Estimated Creatinine Clearance: 80.4 mL/min (by C-G formula based on SCr of 0.78 mg/dL).   Medical History: Past Medical History:  Diagnosis Date   Anxiety    Arthritis    Asthma    GERD (gastroesophageal reflux disease)    Glaucoma    Glaucoma    History of stroke    Hyperlipidemia    Hyperlipidemia    Hypertension    Peripheral vascular disease ([place])    Sleep apnea     Medications:  Medications Prior to Admission  Medication Sig Dispense Refill Last Dose   acetaminophen (TYLENOL) 500 MG tablet Take 2 tablets (1,000 mg total) by mouth 3 (three) times daily as needed. Home med. 30 tablet 0    bisacodyl (DULCOLAX) 10 MG suppository Place 1 suppository (10 mg total) rectally daily as needed for moderate constipation. 12 suppository 0    cetirizine (ZYRTEC) 10 MG tablet Take 10 mg by mouth at bedtime.  3    clopidogrel (PLAVIX) 75 MG tablet Take 1 tablet (75 mg total) by mouth daily. Resume 14 days after your surgery, which is 12/12/20.      Dorzolamide HCl-Timolol Mal PF 22.3-6.8 MG/ML SOLN Place 1 drop into the right eye 2 (two) times daily.      ergocalciferol (VITAMIN D2) 1.25 MG (50000 UT) capsule Take 50,000 Units by mouth once a week.      furosemide (LASIX) 20 MG tablet Take 40 mg by mouth daily.      latanoprost  (XALATAN) 0.005 % ophthalmic solution Place 1 drop into both eyes at bedtime.  0    losartan (COZAAR) 50 MG tablet Take 50 mg by mouth 2 (two) times daily.      metoprolol succinate (TOPROL-XL) 50 MG 24 hr tablet Take 2 tablets (100 mg total) by mouth daily. Home med.      pantoprazole (PROTONIX) 40 MG tablet Take 40 mg by mouth daily as needed.      rosuvastatin (CRESTOR) 10 MG tablet Take 10 mg by mouth at bedtime.      senna-docusate (SENOKOT-S) 8.6-50 MG tablet Take 2 tablets by mouth 2 (two) times daily. While you are on pain medication.      tamsulosin (FLOMAX) 0.4 MG CAPS capsule Take 2 capsules (0.8 mg total) by mouth daily after supper. 30 capsule     triamcinolone cream (KENALOG) 0.1 % Apply 1 application topically 3 (three) times daily. (Apply to rash on legs)       Assessment: Pharmacy consulted to dose heparin in this 86 year old male admitted with PE.  No prior anticoag noted.  CrCl = 80 ml/min   1/22: transitioning patient from heparin to apixaban  Goal of Therapy:  Monitor platelets by anticoagulation protocol: Yes   Plan:  --Apixaban 10mg  PO BID x 7 days, followed by 5mg  PO BID thereafter --Will continue to monitor H&H with CBC daily  Chris Kent, PharmD Clinical Pharmacist 02/25/2021 2:44 PM

## 2021-02-25 NOTE — ED Notes (Signed)
Password for medical record/update release for patient is "Chris" per daughter Chris Kent.

## 2021-02-25 NOTE — Progress Notes (Signed)
ANTICOAGULATION CONSULT NOTE - Initial Consult  Pharmacy Consult for Heparin  Indication: pulmonary embolus  No Known Allergies  Patient Measurements: Height: 5\' 9"  (175.3 cm) Weight: 99.8 kg (220 lb) IBW/kg (Calculated) : 70.7 Heparin Dosing Weight:  91.8 kg   Vital Signs: BP: 116/60 (01/21 2300) Pulse Rate: 80 (01/21 2300)  Labs: Recent Labs    02/24/21 1348 02/24/21 1637  HGB 13.6  --   HCT 42.4  --   PLT 117*  --   CREATININE 0.78  --   TROPONINIHS 21* 21*    Estimated Creatinine Clearance: 80 mL/min (by C-G formula based on SCr of 0.78 mg/dL).   Medical History: Past Medical History:  Diagnosis Date   Anxiety    Arthritis    Asthma    GERD (gastroesophageal reflux disease)    Glaucoma    Glaucoma    History of stroke    Hyperlipidemia    Hyperlipidemia    Hypertension    Peripheral vascular disease (HCC)    Sleep apnea     Medications:  (Not in a hospital admission)   Assessment: Pharmacy consulted to dose heparin in this 86 year old male admitted with PE.  No prior anticoag noted. CrCl = 80 ml/min   Goal of Therapy:  Heparin level 0.3-0.7 units/ml Monitor platelets by anticoagulation protocol: Yes   Plan:  Give 5000 units bolus x 1 Start heparin infusion at 1600 units/hr Check anti-Xa level in 8 hours and daily while on heparin Continue to monitor H&H and platelets  Natori Gudino D 02/25/2021,1:55 AM

## 2021-02-25 NOTE — Evaluation (Signed)
Physical Therapy Evaluation Patient Details Name: Chris Kent MRN: [id number] DOB: [birth year] Today's Date: 02/25/2021  History of Present Illness  86 year old male with history of CAD, hypertension, hyperlipidemia, insomnia, OSA, anxiety, who came to the emergency department last week with confusion and worsening shortness of breath.  Went home and returns with similar;  found to have PE.  Clinical Impression  Pt pleasant and eager to work with PT, showed some general impulsivity and low grade confusion but was able to maintain appropriate conversation, give some PLOF history that is congruent with recent documentation and followed basic instructions well.  Per order MD acknowledges acute PE and clears pt to be up and walking with PT.  Pt ultimately did very well in this regard and was able to ambulate first to the bathroom with walker where he made a good effort at being independent but did need assist with clean up, and then 2 loops around the C pod; once with walker once with SPC and maintaining high 90s sats the entire time on 2L.  Pt showed poor general safety awareness and some impulsivity but with supervision should be able to safely return home.     Recommendations for follow up therapy are one component of a multi-disciplinary discharge planning process, led by the attending physician.  Recommendations may be updated based on patient status, additional functional criteria and insurance authorization.  Follow Up Recommendations Home health PT    Assistance Recommended at Discharge Frequent or constant Supervision/Assistance  Patient can return home with the following  A little help with bathing/dressing/bathroom;Help with stairs or ramp for entrance;Assistance with cooking/housework;Assist for transportation;A little help with walking and/or transfers    Equipment Recommendations None recommended by PT  Recommendations for Other Services       Functional Status Assessment  Patient has had a recent decline in their functional status and demonstrates the ability to make significant improvements in function in a reasonable and predictable amount of time.     Precautions / Restrictions Precautions Precautions: Fall Restrictions Weight Bearing Restrictions: No      Mobility  Bed Mobility Overal bed mobility: Needs Assistance Bed Mobility: Supine to Sit, Sit to Supine     Supine to sit: Supervision Sit to supine: Min guard   General bed mobility comments: On arrival pt partially out of bed pulling on bed rail, able to get to sitting and back to supine post ambulation w/o direct assist    Transfers Overall transfer level: Modified independent Equipment used: Rolling walker (2 wheels), Straight cane Transfers: Sit to/from Stand Sit to Stand: Supervision           General transfer comment: Pt able to rise with (RW and SPC) and w/o AD, but did need UE use    Ambulation/Gait Ambulation/Gait assistance: Min guard Gait Distance (Feet): 300 Feet Assistive device: Rolling walker (2 wheels), Straight cane         General Gait Details: 150 ft with walker, then 150 ft with SPC.  On 2L O2 t/o the effort with sats remaining in the high 90s.  Pt needed directional cuing but able to [facility] a relatively safe and consistent cadence otherwise.  Stairs            Wheelchair Mobility    Modified Rankin (Stroke Patients Only)       Balance Overall balance assessment: Needs assistance Sitting-balance support: No upper extremity supported, Feet supported Sitting balance-Leahy Scale: Good     Standing balance support: Single extremity supported,  During functional activity, Bilateral upper extremity supported Standing balance-Leahy Scale: Fair Standing balance comment: no LOBs during transitions and while trying to clean up after BM (PT ultimately needed to assist while pt stood holding hand rail                              Pertinent Vitals/Pain Pain Assessment Pain Assessment:  (report minimal L forearm pain from fall)    Home Living Family/patient expects to be discharged to:: Private residence Living Arrangements: Alone (grandson (and his girlfriend) have apparently been staying with him and per pt (and previous documentation) can continue to as long as needed) Available Help at Discharge: Family;Available 24 hours/day Type of Home: House Home Access: Stairs to enter Entrance Stairs-Rails: None Entrance Stairs-Number of Steps: 1   Home Layout: One level Home Equipment: Conservation officer, nature (2 wheels);Tub bench;Hand held shower head;Cane - single point      Prior Function Prior Level of Function : Independent/Modified Independent;History of Falls (last six months)             Mobility Comments: multiple falls in the last 6 months per pt related to slipping or tripping. Independent for all activities and mobility. Is not driving at this time.       Hand Dominance        Extremity/Trunk Assessment   Upper Extremity Assessment Upper Extremity Assessment: Generalized weakness (arthritic-like limitations in b/l shoulders, functional otherwise)    Lower Extremity Assessment Lower Extremity Assessment: Overall WFL for tasks assessed;Generalized weakness       Communication   Communication: No difficulties  Cognition Arousal/Alertness: Awake/alert Behavior During Therapy: Impulsive Overall Cognitive Status: Within Functional Limits for tasks assessed (pt able to follow and remain safe with consistent cuing but showed definite impulsivity with self selected activity)                                          General Comments General comments (skin integrity, edema, etc.): Pt with some impulsivity but showed good confidence and relative safety with prolonged ambulation, vitals stable and appriopriate despite acute PE    Exercises     Assessment/Plan    PT Assessment  Patient needs continued PT services  PT Problem List Decreased mobility;Decreased balance;Decreased strength;Decreased cognition;Decreased knowledge of use of DME;Decreased safety awareness       PT Treatment Interventions DME instruction;Gait training;Functional mobility training;Therapeutic activities;Balance training;Therapeutic exercise;Neuromuscular re-education;Cognitive remediation;Patient/family education    PT Goals (Current goals can be found in the Care Plan section)  Acute Rehab PT Goals Patient Stated Goal: to go home PT Goal Formulation: With patient Time For Goal Achievement: 03/11/21 Potential to Achieve Goals: Good    Frequency Min 2X/week     Co-evaluation               AM-PAC PT "6 Clicks" Mobility  Outcome Measure Help needed turning from your back to your side while in a flat bed without using bedrails?: None Help needed moving from lying on your back to sitting on the side of a flat bed without using bedrails?: None Help needed moving to and from a bed to a chair (including a wheelchair)?: A Little Help needed standing up from a chair using your arms (e.g., wheelchair or bedside chair)?: A Little Help needed to walk in hospital room?: A Little Help needed  climbing 3-5 steps with a railing? : A Little 6 Click Score: 20    End of Session Equipment Utilized During Treatment: Gait belt Activity Tolerance: Patient tolerated treatment well Patient left: in bed;with call bell/phone within reach Nurse Communication: Mobility status PT Visit Diagnosis: Unsteadiness on feet (R26.81);Difficulty in walking, not elsewhere classified (R26.2);History of falling (Z91.81);Muscle weakness (generalized) (M62.81)    Time: 1100-1135 PT Time Calculation (min) (ACUTE ONLY): 35 min   Charges:   PT Evaluation $PT Eval Low Complexity: 1 Low PT Treatments $Gait Training: 8-22 mins $Therapeutic Activity: 8-22 mins        Kreg Shropshire, DPT 02/25/2021, 1:00 PM

## 2021-02-25 NOTE — ED Notes (Signed)
CT at bedside 

## 2021-02-25 NOTE — H&P (Signed)
History and Physical    Chris Kent MGQ:[id number] DOB: 11-19-1935 DOA: 02/24/2021  PCP: Idelle Crouch, MD   Patient coming from: home  I have personally briefly reviewed patient's relevant medical records in [place]  Chief Complaint: shortness of breath, confusion  HPI: Chris Kent is a 86 y.o. male with medical history significant for COPD followed by pulmonology, CAD, prior stroke, HTN, history of burst fracture of thoracic vertebra October 2022, who presents to the emergency room with shortness of breath and persistent lower extremity edema.  Patient was seen by pulmonology in December for shortness of breath.  Was seen in the on 02/21/21 for lower extremity edema with mostly unremarkable work-up.  He went on to have an echocardiogram on 1/19 to evaluate for CHF that was unremarkable with EF 55 to 60%.  Family reports that the shortness of breath continued and he now appears to be confused and thus they brought him back to the emergency room for evaluation.  ED course: On arrival BP 120/58 and other vitals unremarkable Blood work significant for troponin 21-21, BNP 104 and D-dimer 1.63.  Potassium 3.2 ABG unremarkable  EKG, personally viewed and interpreted: Sinus rhythm at 60 with nonspecific ST-T wave changes  Imaging: CTA chest showing acute PE with moderate embolic burden and abnormal RV/LV ratio with recommendation for echo to help assess for acute right heart strain.  Small left lower lobe pulmonary infarct Stable pulmonary nodules  Patient started on heparin infusion.  Hospitalist consulted for admission.   Review of Systems: As per HPI otherwise all other systems on review of systems negative.   Assessment/Plan    Acute pulmonary embolism with left lower lobe pulmonary infarct -Probably provoked from fairly recent immobilization related to burst fracture - Heparin infusion - Supplemental oxygen if needed - Echo from 1/18 without evidence of heart  strain can consider repeating as recommended by radiologist - N.p.o. in case of worsening symptoms and need for vascular consult for procedure    History of Burst fracture of thoracic vertebra 11/2020 - No acute issues    Acute confusion - Secondary to acute illness - Neurologic checks - Fall precautions -We will keep n.p.o. tonight    Essential hypertension, benign - Continue losartan, metoprolol    Pulmonary nodules - Stable    History of stroke - We will hold clopidogrel to decrease bleeding risk while on heparin - Continue rosuvastatin    CAD (coronary artery disease) - Continue metoprolol, rosuvastatin.  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 This is judged to be reasonable and necessary in order to provide the required intensity of service to ensure the patient's safety given the presenting symptoms, physical exam findings, and initial radiographic and laboratory data in the context of their  Comorbid conditions.   Patient requires inpatient status due to high intensity of service, high risk for further deterioration and high frequency of surveillance required.   I certify that at the point of admission it is my clinical judgment that the patient will require inpatient hospital care spanning beyond 2 midnights     Physical Exam: Vitals:   02/24/21 2000 02/24/21 2100 02/24/21 2200 02/24/21 2300  BP: 127/68 (!) 118/91 119/61 116/60  Pulse: 66 74 73 80  Resp:  20 20 16   Temp:      TempSrc:      SpO2: 99% 98% 98% 97%  Weight:      Height:       Constitutional: Alert, oriented x 2. Not in any apparent distress HEENT:      Head:  Normocephalic and atraumatic.         Eyes: PERLA, EOMI, Conjunctivae are normal. Sclera is non-icteric.       Mouth/Throat: Mucous membranes are moist.       Neck: Supple with no signs of meningismus. Cardiovascular: Regular rate and rhythm. No murmurs, gallops, or rubs. 2+ symmetrical distal pulses are present . No JVD. 2-3+edema Respiratory: Respiratory effort increased with scattered wheezes Gastrointestinal: Soft, non tender, non distended. Positive bowel sounds.  Genitourinary: No CVA tenderness. Musculoskeletal: Nontender with normal range of motion in all extremities. No cyanosis, or erythema of extremities. Neurologic:  Face is symmetric. Moving all extremities. No gross focal neurologic deficits . Skin: Skin is warm, dry.  No rash or ulcers Psychiatric: Mood and affect are appropriate     Past Medical History:  Diagnosis Date   Anxiety    Arthritis    Asthma    GERD (gastroesophageal reflux disease)    Glaucoma    Glaucoma    History of stroke    Hyperlipidemia    Hyperlipidemia    Hypertension    Peripheral vascular disease ([place])    Sleep apnea     Past Surgical History:  Procedure Laterality Date   REPLACEMENT TOTAL KNEE BILATERAL       reports that he has never smoked. He has never used smokeless tobacco. He reports that he does not drink alcohol and does not use drugs.  No Known Allergies  Family History  Problem Relation Age of Onset   Prostate cancer Father       Prior to Admission medications   Medication Sig Start Date End Date Taking? Authorizing Provider  acetaminophen (TYLENOL) 500 MG tablet Take 2 tablets (1,000 mg total) by mouth 3 (three) times daily as needed. Home med. 02/21/21   Enzo Bi, MD  bisacodyl (DULCOLAX) 10 MG suppository Place 1 suppository (10 mg total) rectally daily as needed for moderate constipation. 10/31/20   Bonnielee Haff, MD  cetirizine (ZYRTEC) 10 MG tablet Take 10 mg by mouth at bedtime.    [provider]   clopidogrel (PLAVIX) 75 MG tablet Take 1 tablet (75 mg total) by mouth daily. Resume 14 days after your surgery, which is 12/12/20. 12/12/20   Enzo Bi, MD  Dorzolamide HCl-Timolol Mal PF 22.3-6.8 MG/ML SOLN Place 1 drop into the right eye 2 (two) times daily.    [provider]  ergocalciferol (VITAMIN D2) 1.25 MG (50000 UT) capsule Take 50,000 Units by mouth once a week. 11/01/20   [provider]  furosemide (LASIX) 20 MG tablet Take 40 mg by mouth daily. 02/15/21   [provider]  latanoprost (XALATAN) 0.005 % ophthalmic solution Place 1 drop into both eyes at bedtime.    [provider]  losartan (COZAAR) 50 MG tablet Take 50 mg by mouth 2 (two) times daily.    [provider]  metoprolol succinate (TOPROL-XL) 50 MG 24 hr tablet Take 2 tablets (100 mg total) by mouth daily. Home med. 02/21/21   Enzo Bi, MD  pantoprazole (PROTONIX) 40 MG tablet Take 40 mg by mouth  daily as needed. 11/01/20   [provider]  rosuvastatin (CRESTOR) 10 MG tablet Take 10 mg by mouth at bedtime.    [provider]  senna-docusate (SENOKOT-S) 8.6-50 MG tablet Take 2 tablets by mouth 2 (two) times daily. While you are on pain medication. 11/30/20   Enzo Bi, MD  tamsulosin (FLOMAX) 0.4 MG CAPS capsule Take 2 capsules (0.8 mg total) by mouth daily after supper. 10/31/20   Bonnielee Haff, MD  triamcinolone cream (KENALOG) 0.1 % Apply 1 application topically 3 (three) times daily. (Apply to rash on legs)    [provider]      Labs on Admission: I have personally reviewed following labs and imaging studies  CBC: Recent Labs  Lab 02/20/21 1848 02/21/21 0542 02/24/21 1348  WBC 8.9 9.5 7.1  HGB 13.1 13.3 13.6  HCT 40.1 41.1 42.4  MCV 97.3 96.3 98.4  PLT 125* 118* 161*   Basic Metabolic Panel: Recent Labs  Lab 02/20/21 1848 02/21/21 0542 02/24/21 1348  NA 141 135 140  K 3.5 3.1* 3.2*  CL 102 99 104  CO2 29 30 29   GLUCOSE 112*  105* 115*  BUN 23 19 25*  CREATININE 1.02 0.88 0.78  CALCIUM 8.8* 8.3* 8.5*   GFR: Estimated Creatinine Clearance: 80 mL/min (by C-G formula based on SCr of 0.78 mg/dL). Liver Function Tests: Recent Labs  Lab 02/20/21 1848  AST 17  ALT 10  ALKPHOS 70  BILITOT 1.0  PROT 5.7*  ALBUMIN 3.4*   No results for input(s): LIPASE, AMYLASE in the last 168 hours. Recent Labs  Lab 02/20/21 2327  AMMONIA 20   Coagulation Profile: No results for input(s): INR, PROTIME in the last 168 hours. Cardiac Enzymes: No results for input(s): CKTOTAL, CKMB, CKMBINDEX, TROPONINI in the last 168 hours. BNP (last 3 results) No results for input(s): PROBNP in the last 8760 hours. HbA1C: No results for input(s): HGBA1C in the last 72 hours. CBG: No results for input(s): GLUCAP in the last 168 hours. Lipid Profile: No results for input(s): CHOL, HDL, LDLCALC, TRIG, CHOLHDL, LDLDIRECT in the last 72 hours. Thyroid Function Tests: No results for input(s): TSH, T4TOTAL, FREET4, T3FREE, THYROIDAB in the last 72 hours. Anemia Panel: No results for input(s): VITAMINB12, FOLATE, FERRITIN, TIBC, IRON, RETICCTPCT in the last 72 hours. Urine analysis:    Component Value Date/Time   COLORURINE YELLOW 02/20/2021 2155   APPEARANCEUR CLEAR 02/20/2021 2155   APPEARANCEUR Clear 11/21/2017 1011   LABSPEC 1.020 02/20/2021 2155   LABSPEC 1.013 09/20/2012 0122   PHURINE 7.5 02/20/2021 2155   GLUCOSEU NEGATIVE 02/20/2021 2155   GLUCOSEU Negative 09/20/2012 0122   HGBUR TRACE (A) 02/20/2021 2155   BILIRUBINUR NEGATIVE 02/20/2021 2155   BILIRUBINUR Negative 11/21/2017 1011   BILIRUBINUR Negative 09/20/2012 0122   KETONESUR NEGATIVE 02/20/2021 2155   PROTEINUR NEGATIVE 02/20/2021 2155   NITRITE NEGATIVE 02/20/2021 2155   LEUKOCYTESUR NEGATIVE 02/20/2021 2155   LEUKOCYTESUR Negative 09/20/2012 0122    Radiological Exams on Admission: DG Chest 2 View  Result Date: 02/24/2021 CLINICAL DATA:  Shortness of  breath and bilateral swelling. EXAM: CHEST - 2 VIEW COMPARISON:  02/21/2021 FINDINGS: UPPER limits normal heart size noted. Mild pulmonary vascular congestion is present. Trace bilateral pleural effusions and mild bibasilar atelectasis noted. There is no evidence of airspace disease, pneumothorax or acute bony abnormality. Spinal surgical hardware again noted. IMPRESSION: Mild pulmonary vascular congestion, trace bilateral pleural effusions and mild bibasilar atelectasis. Electronically Signed   By: Dellis Filbert  Hu M.D.   On: 02/24/2021 15:17   DG Elbow Complete Left  Result Date: 02/24/2021 CLINICAL DATA:  Fall, left elbow pain EXAM: LEFT ELBOW - COMPLETE 3+ VIEW COMPARISON:  None. FINDINGS: Substantial chronic appearing spurring along the common extensor tendon. Notable spurring along the sublime tubercle and moderate spurring along the common flexor tendon. Mildly irregular spurring the quadriceps attachment site to the olecranon. No fracture is identified, but there a substantial elbow joint effusion, with anterior sail sign and visible posterior fat pad. Subcutaneous edema tracks posterior to the olecranon. IMPRESSION: 1. Large elbow joint effusion.  No definite fracture is identified. 2. Notable spurring in the elbow particularly along the common flexor and common extensor tendons, sublime tubercle, and triceps attachment site to the olecranon. 3. Subcutaneous edema posterior to the olecranon. Electronically Signed   By: Van Clines M.D.   On: 02/24/2021 15:00   CT Head Wo Contrast  Result Date: 02/25/2021 CLINICAL DATA:  Head trauma, minor (Age >= 65y) EXAM: CT HEAD WITHOUT CONTRAST TECHNIQUE: Contiguous axial images were obtained from the base of the skull through the vertex without intravenous contrast. RADIATION DOSE REDUCTION: This exam was performed according to the departmental dose-optimization program which includes automated exposure control, adjustment of the mA and/or kV according to  patient size and/or use of iterative reconstruction technique. COMPARISON:  02/20/2021 FINDINGS: Brain: Normal anatomic configuration. Parenchymal volume loss is commensurate with the patient's age. Mild periventricular white matter changes are present likely reflecting the sequela of small vessel ischemia. Stable remote right thalamic infarct. No abnormal intra or extra-axial mass lesion or fluid collection. No abnormal mass effect or midline shift. No evidence of acute intracranial hemorrhage or infarct. Ventricular size is normal. Cerebellum unremarkable. Vascular: No asymmetric hyperdense vasculature at the skull base. Skull: Intact Sinuses/Orbits: Paranasal sinuses are clear. Orbits are unremarkable. Other: Mastoid air cells and middle ear cavities are clear. IMPRESSION: No acute intracranial abnormality.  No calvarial fracture. Electronically Signed   By: Fidela Salisbury M.D.   On: 02/25/2021 01:43   CT Angio Chest PE W and/or Wo Contrast  Result Date: 02/25/2021 CLINICAL DATA:  Pulmonary embolism (PE) suspected, positive D-dimer. Dyspnea on exertion, leg swelling EXAM: CT ANGIOGRAPHY CHEST WITH CONTRAST TECHNIQUE: Multidetector CT imaging of the chest was performed using the standard protocol during bolus administration of intravenous contrast. Multiplanar CT image reconstructions and MIPs were obtained to evaluate the vascular anatomy. RADIATION DOSE REDUCTION: This exam was performed according to the departmental dose-optimization program which includes automated exposure control, adjustment of the mA and/or kV according to patient size and/or use of iterative reconstruction technique. CONTRAST:  25mL OMNIPAQUE IOHEXOL 350 MG/ML SOLN COMPARISON:  10/26/2020 FINDINGS: Cardiovascular: There is adequate opacification of the a pulmonary arterial tree. There is occlusive thrombus within the a left lower lobar pulmonary artery in keeping with acute pulmonary embolism. The embolic burden is moderate. The RV/LV  ratio is 1.05, however, this is stable since prior CT examination. Trace pericardial effusion is present. Mild coronary artery calcifications present. Global cardiac size within normal limits. Central pulmonary arteries are of normal caliber. Mild atherosclerotic calcification within the thoracic aorta. No aortic aneurysm. Mediastinum/Nodes: No pathologic thoracic adenopathy. The esophagus is patulous. The visualized thyroid is unremarkable. Lungs/Pleura: There is partial left lower lobe collapse with peripheral consolidation possibly reflecting a superimposed peripheral pulmonary infarct. Stable 5 mm noncalcified pulmonary nodule within the right middle lobe. Additional smaller nodules are also unchanged. No new focal pulmonary nodules identified. No pneumothorax or  pleural effusion. Central airways are unremarkable. Upper Abdomen: Status post cholecystectomy.  No acute abnormality. Musculoskeletal: Thoracal lumbar fusion and cervicothoracic fusion hardware is identified. Ankylosis of the thoracic spine is again identified in keeping with changes of ankylosing spondylitis. Chance fracture at T11-12 noted, unchanged from intraoperative examination of 11/27/2020. No acute bone abnormality. Review of the MIP images confirms the above findings. IMPRESSION: Acute pulmonary embolism. Moderate embolic burden. While the RV/LV ratio is abnormal, this appears stable since prior examination. Echocardiography may be helpful to assess for evidence of acute right heart strain. Small left lower lobe pulmonary infarct. Multiple stable small pulmonary nodules within the right lung, largest within the right middle lobe, safely considered benign. Mild coronary artery calcification. Findings in keeping with ankylosing spondylitis with Chance fracture at T11-12, unchanged from prior examination status post surgical fixation. Aortic Atherosclerosis (ICD10-I70.0). These results were called by telephone at the time of interpretation on  02/25/2021 at 1:36 am to provider [facility] , who verbally acknowledged these results. Electronically Signed   By: Fidela Salisbury M.D.   On: 02/25/2021 01:40   DG Shoulder Left  Result Date: 02/24/2021 CLINICAL DATA:  Left shoulder pain, fall. EXAM: LEFT SHOULDER - 2+ VIEW COMPARISON:  02/20/2018 FINDINGS: Stable AC joint spurring. Substantial glenohumeral spurring especially inferiorly. No fracture or dislocation identified. Cervicothoracic posterolateral rod fixators. The patient appears to have limited ability to externally rotate the shoulder, no external rotation view is provided. IMPRESSION: 1. Advanced degenerative glenohumeral arthropathy. Moderate degenerative AC joint spurring. 2. No fracture or dislocation identified. Electronically Signed   By: Van Clines M.D.   On: 02/24/2021 15:02       Athena Masse MD [facility]   02/25/2021, 1:55 AM

## 2021-02-25 NOTE — ED Notes (Signed)
Cancelled order for IV team since April, RN was able to successfully place peripheral 20g IV catheter in right antecubital vein

## 2021-02-25 NOTE — ED Notes (Signed)
Attempted peripheral IV three times unsuccessfully in right arm. Left arm painful especially when moved due to fall. Pt's veins very fragile in right arm and has multiple bruises from phlebotomy and peripheral IV during hospital stay earlier in the week. Placed IV team order since pt needs 20g for CT angio chest PE with possible contrast.

## 2021-02-25 NOTE — TOC CM/SW Note (Signed)
Eliquis coupon provided to patient at bedside.  Chris Kent, [place]

## 2021-02-25 NOTE — ED Notes (Signed)
Sent urine to lab after pt urinated in suction canister in purewick in case needed later with temp label.

## 2021-02-25 NOTE — Progress Notes (Signed)
Patient ID: Chris Kent, male   DOB: [birth year], 86 y.o.   MRN: [id number] [facility] PROGRESS NOTE  Chris Kent [id number] DOB: [birth year] DOA: 02/24/2021 PCP: Idelle Crouch, MD  HPI/Subjective: Patient states he feels okay.  Still has some swelling in his legs.  Came back to the hospital with shortness of breath and confusion.  Diagnosed with pulmonary embolism.  Objective: Vitals:   02/25/21 0743 02/25/21 1135  BP: (!) 145/70 129/67  Pulse: 85 73  Resp: 18 18  Temp: 97.7 F (36.5 C) 97.9 F (36.6 C)  SpO2: 100% 98%    Intake/Output Summary (Last 24 hours) at 02/25/2021 1527 Last data filed at 02/25/2021 1423 Gross per 24 hour  Intake 397.98 ml  Output 2150 ml  Net -1752.02 ml   Filed Weights   02/24/21 1346 02/25/21 0403  Weight: 99.8 kg 100.8 kg    ROS: Review of Systems  Respiratory:  Positive for shortness of breath.   Cardiovascular:  Negative for chest pain.  Gastrointestinal:  Negative for abdominal pain, nausea and vomiting.  Exam: Physical Exam HENT:     Head: Normocephalic.     Mouth/Throat:     Pharynx: No oropharyngeal exudate.  Eyes:     General: Lids are normal.     Conjunctiva/sclera: Conjunctivae normal.  Cardiovascular:     Rate and Rhythm: Normal rate and regular rhythm.     Heart sounds: Normal heart sounds, S1 normal and S2 normal.  Pulmonary:     Breath sounds: Examination of the right-lower field reveals decreased breath sounds. Examination of the left-lower field reveals decreased breath sounds. Decreased breath sounds present. No wheezing, rhonchi or rales.  Abdominal:     Palpations: Abdomen is soft.     Tenderness: There is no abdominal tenderness.  Musculoskeletal:     Right lower leg: Swelling present.     Left lower leg: Swelling present.     Comments: Left arm in sling.  Neurological:     Mental Status: He is alert and oriented to person, place, and time.      Scheduled Meds:  apixaban  10 mg Oral BID    Followed by   Derrill Memo ON 03/04/2021] apixaban  5 mg Oral BID   dorzolamidel-timolol  1 drop Right Eye BID   furosemide  40 mg Intravenous BID   latanoprost  1 drop Both Eyes QHS   loratadine  10 mg Oral Daily   losartan  50 mg Oral BID   metoprolol succinate  100 mg Oral Daily   potassium chloride  40 mEq Oral BID   rosuvastatin  10 mg Oral QHS   senna-docusate  2 tablet Oral BID   tamsulosin  0.8 mg Oral QPC supper   triamcinolone cream  1 application Topical TID     Assessment/Plan:  Acute pulmonary embolism left lower lobe with pulmonary infarct.  Switch IV heparin over to Eliquis.  Eliquis 30-day free card. Chronic diastolic congestive heart failure with lower extremity edema.  Switch Lasix to 40 mg IV twice daily and continue to monitor.  Replace potassium. Acute metabolic encephalopathy.  Patient answering some questions appropriately but others not so appropriately.  He stated he lives with his wife and his wife has already passed away. Essential hypertension on losartan and Toprol and Lasix. History of CAD continue Crestor.  Discontinue Plavix since starting Eliquis. Obstructive sleep apnea on CPAP at night BPH on Flomax Weakness.  Physical therapy recommending home with home health. Pulmonary nodule  seen on CT scan commented as benign as per radiologist        Code Status:     Code Status Orders  (From admission, onward)           Start     Ordered   02/25/21 0203  Full code  Continuous        02/25/21 0204           Code Status History     Date Active Date Inactive Code Status Order ID Comments User Context   02/20/2021 2221 02/21/2021 2047 Full Code [id number]  Criss Alvine, DO ED   11/23/2020 1429 11/30/2020 2157 Full Code [id number]  Lorella Nimrod, MD Inpatient   11/22/2020 1306 11/23/2020 1429 Partial Code [id number]  Ivor Costa, MD ED   10/26/2020 1906 11/01/2020 1902 Full Code [id number]  Cox, Briant Cedar, DO ED      Advance Directive Documentation     Flowsheet Row Most Recent Value  Type of Advance Directive Healthcare Power of Attorney, Living will  Pre-existing out of facility DNR order (yellow form or pink MOST form) --  "MOST" Form in Place? --      Family Communication: Updated patient's daughter on the phone Disposition Plan: Status is: Inpatient  Left lower extremity edema will diurese with IV Lasix today and hopefully if swelling is down tomorrow will be able to discharge on Lasix twice a day.  [place]  [facility] [employer]

## 2021-02-26 DIAGNOSIS — I10 Essential (primary) hypertension: Secondary | ICD-10-CM

## 2021-02-26 DIAGNOSIS — N400 Enlarged prostate without lower urinary tract symptoms: Secondary | ICD-10-CM

## 2021-02-26 DIAGNOSIS — G9341 Metabolic encephalopathy: Secondary | ICD-10-CM | POA: Diagnosis not present

## 2021-02-26 DIAGNOSIS — I5032 Chronic diastolic (congestive) heart failure: Secondary | ICD-10-CM

## 2021-02-26 DIAGNOSIS — R918 Other nonspecific abnormal finding of lung field: Secondary | ICD-10-CM | POA: Diagnosis not present

## 2021-02-26 DIAGNOSIS — E538 Deficiency of other specified B group vitamins: Secondary | ICD-10-CM | POA: Diagnosis not present

## 2021-02-26 DIAGNOSIS — I2699 Other pulmonary embolism without acute cor pulmonale: Secondary | ICD-10-CM | POA: Diagnosis not present

## 2021-02-26 DIAGNOSIS — R600 Localized edema: Secondary | ICD-10-CM | POA: Diagnosis not present

## 2021-02-26 LAB — CBC
HCT: 40.8 % (ref 39.0–52.0)
Hemoglobin: 13.4 g/dL (ref 13.0–17.0)
MCH: 30.7 pg (ref 26.0–34.0)
MCHC: 32.8 g/dL (ref 30.0–36.0)
MCV: 93.4 fL (ref 80.0–100.0)
Platelets: 142 10*3/uL — ABNORMAL LOW (ref 150–400)
RBC: 4.37 MIL/uL (ref 4.22–5.81)
RDW: 12.9 % (ref 11.5–15.5)
WBC: 8.3 10*3/uL (ref 4.0–10.5)
nRBC: 0 % (ref 0.0–0.2)

## 2021-02-26 LAB — BASIC METABOLIC PANEL
Anion gap: 7 (ref 5–15)
BUN: 22 mg/dL (ref 8–23)
CO2: 31 mmol/L (ref 22–32)
Calcium: 8.7 mg/dL — ABNORMAL LOW (ref 8.9–10.3)
Chloride: 104 mmol/L (ref 98–111)
Creatinine, Ser: 0.95 mg/dL (ref 0.61–1.24)
GFR, Estimated: >60 mL/min (ref >60)
Glucose, Bld: 106 mg/dL — ABNORMAL HIGH (ref 70–99)
Potassium: 3.4 mmol/L — ABNORMAL LOW (ref 3.5–5.1)
Sodium: 142 mmol/L (ref 135–145)

## 2021-02-26 MED ORDER — VITAMIN B 12 500 MCG PO TABS
500.0000 ug | ORAL_TABLET | Freq: Every day | ORAL | 0 refills | Status: DC
Start: 2021-02-26 — End: 2022-02-24

## 2021-02-26 MED ORDER — APIXABAN 5 MG PO TABS: ORAL_TABLET | ORAL | 0 refills | Status: DC

## 2021-02-26 MED ORDER — CYANOCOBALAMIN 1000 MCG/ML IJ SOLN
1000.0000 ug | Freq: Once | INTRAMUSCULAR | Status: AC
  Administered 2021-02-26: 1000 ug via INTRAMUSCULAR
  Filled 2021-02-26: qty 1

## 2021-02-26 MED ORDER — POTASSIUM CHLORIDE CRYS ER 20 MEQ PO TBCR: 20.0000 meq | EXTENDED_RELEASE_TABLET | Freq: Every day | ORAL | 0 refills | Status: DC

## 2021-02-26 MED ORDER — TORSEMIDE 20 MG PO TABS
ORAL_TABLET | ORAL | 0 refills | Status: DC
Start: 2021-02-27 — End: 2022-02-24

## 2021-02-26 NOTE — Progress Notes (Signed)
Noted after reassessment that the patient's abdomen seemed more distended.  Bladder scans were performed (and documented).   The patient also had 2 BMs today.    Paged MD

## 2021-02-26 NOTE — TOC Transition Note (Signed)
Transition of Care [facility]) - CM/SW Discharge Note   Patient Details  Name: Chris Kent MRN: [id number] Date of Birth: 04-03-1935  Transition of Care [facility]) CM/SW Contact:  Alberteen Sam, LCSW Phone Number: 02/26/2021, 9:25 AM   Clinical Narrative:     CSW notes patient to discharge home today, patient reports being active with [place] with PT, Corene Cornea with [facility] informed of dc today.   Patient reports he already has oxygen at home and is aware to get his girlfriend to bring when she picks him up today.   No further needs identified at this time.    Final next level of care: [place] Barriers to Discharge: No Barriers Identified   Patient Goals and CMS Choice Patient states their goals for this hospitalization and ongoing recovery are:: to go home CMS Medicare.gov Compare Post Acute Care list provided to:: Patient Choice offered to / list presented to : Patient  Discharge Placement                    Patient and family notified of of transfer: 02/26/21  Discharge Plan and Services                  DME Agency: [employer]       HH Arranged: PT, RN [place] Agency: [place] ([place]) Date HH Agency Contacted: 02/26/21 Time [place]: [phone] Representative spoke with at [place]: [place] ([place]) Interventions     Readmission Risk Interventions No flowsheet data found.

## 2021-02-26 NOTE — Care Management CC44 (Signed)
Condition Code 44 Documentation Completed  Patient Details  Name: Chris Kent MRN: [id number] Date of Birth: 1935/12/17   Condition Code 44 given:  Yes Patient signature on Condition Code 44 notice:  Yes Documentation of 2 MD's agreement:  Yes Code 44 added to claim:  Yes    Alberteen Sam, LCSW 02/26/2021, 9:30 AM

## 2021-02-26 NOTE — Discharge Summary (Signed)
[facility] [place] NAME: Chris Kent    MR#:  [id number]  DATE OF BIRTH:  1935/05/05  DATE OF ADMISSION:  02/24/2021 ADMITTING PHYSICIAN: Loletha Grayer, MD  DATE OF DISCHARGE: 02/26/2021 11:47 AM  PRIMARY CARE PHYSICIAN: Idelle Crouch, MD    ADMISSION DIAGNOSIS:  Hypoxia [R09.02] Acute pulmonary embolism ([place]) [I26.99] Altered mental status, unspecified altered mental status type [R41.82] Other acute pulmonary embolism with acute cor pulmonale (HCC) [I26.09] Dyspnea, unspecified type [R06.00] Pulmonary emboli (HCC) [I26.99]  DISCHARGE DIAGNOSIS:  Principal Problem:   Acute pulmonary embolism (HCC) Active Problems:   Essential hypertension, benign   Pulmonary nodules   History of stroke   CAD (coronary artery disease)   COPD with chronic bronchitis (HCC)   History of Burst fracture of thoracic vertebra 11/2020   Acute confusion   Pulmonary emboli ([place])   SECONDARY DIAGNOSIS:   Past Medical History:  Diagnosis Date   Anxiety    Arthritis    Asthma    GERD (gastroesophageal reflux disease)    Glaucoma    Glaucoma    History of stroke    Hyperlipidemia    Hyperlipidemia    Hypertension    Peripheral vascular disease ([place])    Sleep apnea     HOSPITAL COURSE:   Acute pulmonary embolism left lower lobe with pulmonary infarct.  Patient was initially started on IV heparin and I switched over to Eliquis.  30-day free Eliquis card given.  Benefits and risks of blood thinner explained to patient and patient's daughter.  Risk of bleeding explained. Chronic diastolic congestive heart failure with lower extremity edema.  I did give Lasix 40 mg IV twice a day while here in the hospital.  I did give potassium supplementation.  Leg swelling is better after 84 doses of IV Lasix.  We will switch to torsemide 40 mg daily with potassium supplementation at home.  Patient on Toprol and losartan.  Recommend checking a BMP as  outpatient. Acute metabolic encephalopathy.  Patient answering questions appropriately.  Suspect underlying dementia. Vitamin B12 deficiency.  Vitamin B12 level 156 on 10/26/2020.  Did give IM B12 injection while here in the hospital.  We will give oral B12 supplementation upon going home. Essential hypertension on losartan Toprol and now torsemide. History of CAD.  Continue Crestor.  Discontinue Plavix and starting Eliquis. Obstructive sleep apnea on CPAP at night BPH on Flomax Weakness.  Physical therapy recommending home with home health.  Patient walked 300 feet with physical therapy. Pulmonary nodules seen on CT scan.  Radiologist commented on these are benign.  Follow-up with PCP  DISCHARGE CONDITIONS:   Satisfactory  CONSULTS OBTAINED:  None  DRUG ALLERGIES:  No Known Allergies  DISCHARGE MEDICATIONS:   Allergies as of 02/26/2021   No Known Allergies      Medication List     STOP taking these medications    clopidogrel 75 MG tablet Commonly known as: PLAVIX   furosemide 20 MG tablet Commonly known as: LASIX       TAKE these medications    acetaminophen 500 MG tablet Commonly known as: TYLENOL Take 2 tablets (1,000 mg total) by mouth 3 (three) times daily as needed. Home med.   apixaban 5 MG Tabs tablet Commonly known as: ELIQUIS Take two tablets twice a day for six days then one tablet twice a day afterwards   bisacodyl 10 MG suppository Commonly known as: DULCOLAX Place 1 suppository (10 mg total) rectally  daily as needed for moderate constipation.   cetirizine 10 MG tablet Commonly known as: ZYRTEC Take 10 mg by mouth at bedtime.   dorzolamidel-timolol 22.3-6.8 MG/ML Soln ophthalmic solution Commonly known as: COSOPT Place 1 drop into the right eye 2 (two) times daily.   ergocalciferol 1.25 MG (50000 UT) capsule Commonly known as: VITAMIN D2 Take 50,000 Units by mouth once a week.   latanoprost 0.005 % ophthalmic solution Commonly known as:  XALATAN Place 1 drop into both eyes at bedtime.   losartan 50 MG tablet Commonly known as: COZAAR Take 50 mg by mouth 2 (two) times daily.   metoprolol succinate 50 MG 24 hr tablet Commonly known as: TOPROL-XL Take 2 tablets (100 mg total) by mouth daily. Home med.   pantoprazole 40 MG tablet Commonly known as: PROTONIX Take 40 mg by mouth daily as needed.   potassium chloride SA 20 MEQ tablet Commonly known as: KLOR-CON M Take 1 tablet (20 mEq total) by mouth daily.   rosuvastatin 10 MG tablet Commonly known as: CRESTOR Take 10 mg by mouth at bedtime.   senna-docusate 8.6-50 MG tablet Commonly known as: Senokot-S Take 2 tablets by mouth 2 (two) times daily. While you are on pain medication.   tamsulosin 0.4 MG Caps capsule Commonly known as: FLOMAX Take 2 capsules (0.8 mg total) by mouth daily after supper.   torsemide 20 MG tablet Commonly known as: DEMADEX Take 2 tablets po daily in am Start taking on: February 27, 2021   triamcinolone cream 0.1 % Commonly known as: KENALOG Apply 1 application topically 3 (three) times daily. (Apply to rash on legs)   Vitamin B 12 500 MCG Tabs Take 500 mcg by mouth daily.         DISCHARGE INSTRUCTIONS:   Follow-up PCP 5 days  If you experience worsening of your admission symptoms, develop shortness of breath, life threatening emergency, suicidal or homicidal thoughts you must seek medical attention immediately by calling 911 or calling your MD immediately  if symptoms less severe.  You Must read complete instructions/literature along with all the possible adverse reactions/side effects for all the Medicines you take and that have been prescribed to you. Take any new Medicines after you have completely understood and accept all the possible adverse reactions/side effects.   Please note  You were cared for by a hospitalist during your hospital stay. If you have any questions about your discharge medications or the care you  received while you were in the hospital after you are discharged, you can call the unit and asked to speak with the hospitalist on call if the hospitalist that took care of you is not available. Once you are discharged, your primary care physician will handle any further medical issues. Please note that NO REFILLS for any discharge medications will be authorized once you are discharged, as it is imperative that you return to your primary care physician (or establish a relationship with a primary care physician if you do not have one) for your aftercare needs so that they can reassess your need for medications and monitor your lab values.    Today   CHIEF COMPLAINT:   Chief Complaint  Patient presents with   Altered Mental Status    HISTORY OF PRESENT ILLNESS:  Chris Kent  is a 86 y.o. male came in with altered mental status and diagnosed with pulmonary embolism.   VITAL SIGNS:  Blood pressure (!) 143/73, pulse 79, temperature (!) 97.5 F (36.4 C), temperature  source Oral, resp. rate 20, height 5\' 9"  (1.753 m), weight 100.8 kg, SpO2 98 %.  I/O:   Intake/Output Summary (Last 24 hours) at 02/26/2021 1656 Last data filed at 02/26/2021 0954 Gross per 24 hour  Intake 420 ml  Output 1450 ml  Net -1030 ml    PHYSICAL EXAMINATION:  GENERAL:  86 y.o.-year-old patient lying in the bed with no acute distress.  EYES: Pupils equal, round, reactive to light and accommodation. No scleral icterus. HEENT: Head atraumatic, normocephalic. Oropharynx and nasopharynx clear.   LUNGS: Normal breath sounds bilaterally, no wheezing, rales,rhonchi or crepitation. No use of accessory muscles of respiration.  CARDIOVASCULAR: S1, S2 normal. No murmurs, rubs, or gallops.  ABDOMEN: Soft, non-tender, non-distended. EXTREMITIES: 2+ pedal edema, no cyanosis, or clubbing.  Good range of motion left knee without pain with movement NEUROLOGIC: Cranial nerves II through XII are intact. Muscle strength 5/5 in all  extremities. Sensation intact. Gait not checked.  PSYCHIATRIC: The patient is alert and answers questions appropriately.  SKIN: No obvious rash, lesion, or ulcer.   DATA REVIEW:   CBC Recent Labs  Lab 02/26/21 0526  WBC 8.3  HGB 13.4  HCT 40.8  PLT 142*    Chemistries  Recent Labs  Lab 02/20/21 1848 02/21/21 0542 02/26/21 0526  NA 141   < > 142  K 3.5   < > 3.4*  CL 102   < > 104  CO2 29   < > 31  GLUCOSE 112*   < > 106*  BUN 23   < > 22  CREATININE 1.02   < > 0.95  CALCIUM 8.8*   < > 8.7*  AST 17  --   --   ALT 10  --   --   ALKPHOS 70  --   --   BILITOT 1.0  --   --    < > = values in this interval not displayed.      Microbiology Results  Results for orders placed or performed during the hospital encounter of 02/24/21  Resp Panel by RT-PCR (Flu A&B, Covid) Nasopharyngeal Swab     Status: None   Collection Time: 02/25/21  2:52 AM   Specimen: Nasopharyngeal Swab; Nasopharyngeal(NP) swabs in vial transport medium  Result Value Ref Range Status   SARS Coronavirus 2 by RT PCR NEGATIVE NEGATIVE Final    Comment: (NOTE) SARS-CoV-2 target nucleic acids are NOT DETECTED.  The SARS-CoV-2 RNA is generally detectable in upper respiratory specimens during the acute phase of infection. The lowest concentration of SARS-CoV-2 viral copies this assay can detect is 138 copies/mL. A negative result does not preclude SARS-Cov-2 infection and should not be used as the sole basis for treatment or other patient management decisions. A negative result may occur with  improper specimen collection/handling, submission of specimen other than nasopharyngeal swab, presence of viral mutation(s) within the areas targeted by this assay, and inadequate number of viral copies(<138 copies/mL). A negative result must be combined with clinical observations, patient history, and epidemiological information. The expected result is Negative.  Fact Sheet for Patients:   EntrepreneurPulse.com.au  Fact Sheet for Healthcare Providers:  IncredibleEmployment.be  This test is no t yet approved or cleared by the [place] FDA and  has been authorized for detection and/or diagnosis of SARS-CoV-2 by FDA under an Emergency Use Authorization (EUA). This EUA will remain  in effect (meaning this test can be used) for the duration of the COVID-19 declaration under Section 564(b)(1) of the  Act, 21 U.S.C.section 360bbb-3(b)(1), unless the authorization is terminated  or revoked sooner.       Influenza A by PCR NEGATIVE NEGATIVE Final   Influenza B by PCR NEGATIVE NEGATIVE Final    Comment: (NOTE) The Xpert Xpress SARS-CoV-2/FLU/RSV plus assay is intended as an aid in the diagnosis of influenza from Nasopharyngeal swab specimens and should not be used as a sole basis for treatment. Nasal washings and aspirates are unacceptable for Xpert Xpress SARS-CoV-2/FLU/RSV testing.  Fact Sheet for Patients: EntrepreneurPulse.com.au  Fact Sheet for Healthcare Providers: IncredibleEmployment.be  This test is not yet approved or cleared by the [place] FDA and has been authorized for detection and/or diagnosis of SARS-CoV-2 by FDA under an Emergency Use Authorization (EUA). This EUA will remain in effect (meaning this test can be used) for the duration of the COVID-19 declaration under Section 564(b)(1) of the Act, 21 U.S.C. section 360bbb-3(b)(1), unless the authorization is terminated or revoked.  Performed at [facility], [address] [place], [place] [address]     RADIOLOGY:  CT Head Wo Contrast  Result Date: 02/25/2021 CLINICAL DATA:  Head trauma, minor (Age >= 65y) EXAM: CT HEAD WITHOUT CONTRAST TECHNIQUE: Contiguous axial images were obtained from the base of the skull through the vertex without intravenous contrast. RADIATION DOSE REDUCTION: This exam was performed  according to the departmental dose-optimization program which includes automated exposure control, adjustment of the mA and/or kV according to patient size and/or use of iterative reconstruction technique. COMPARISON:  02/20/2021 FINDINGS: Brain: Normal anatomic configuration. Parenchymal volume loss is commensurate with the patient's age. Mild periventricular white matter changes are present likely reflecting the sequela of small vessel ischemia. Stable remote right thalamic infarct. No abnormal intra or extra-axial mass lesion or fluid collection. No abnormal mass effect or midline shift. No evidence of acute intracranial hemorrhage or infarct. Ventricular size is normal. Cerebellum unremarkable. Vascular: No asymmetric hyperdense vasculature at the skull base. Skull: Intact Sinuses/Orbits: Paranasal sinuses are clear. Orbits are unremarkable. Other: Mastoid air cells and middle ear cavities are clear. IMPRESSION: No acute intracranial abnormality.  No calvarial fracture. Electronically Signed   By: Fidela Salisbury M.D.   On: 02/25/2021 01:43   CT Angio Chest PE W and/or Wo Contrast  Result Date: 02/25/2021 CLINICAL DATA:  Pulmonary embolism (PE) suspected, positive D-dimer. Dyspnea on exertion, leg swelling EXAM: CT ANGIOGRAPHY CHEST WITH CONTRAST TECHNIQUE: Multidetector CT imaging of the chest was performed using the standard protocol during bolus administration of intravenous contrast. Multiplanar CT image reconstructions and MIPs were obtained to evaluate the vascular anatomy. RADIATION DOSE REDUCTION: This exam was performed according to the departmental dose-optimization program which includes automated exposure control, adjustment of the mA and/or kV according to patient size and/or use of iterative reconstruction technique. CONTRAST:  49mL OMNIPAQUE IOHEXOL 350 MG/ML SOLN COMPARISON:  10/26/2020 FINDINGS: Cardiovascular: There is adequate opacification of the a pulmonary arterial tree. There is  occlusive thrombus within the a left lower lobar pulmonary artery in keeping with acute pulmonary embolism. The embolic burden is moderate. The RV/LV ratio is 1.05, however, this is stable since prior CT examination. Trace pericardial effusion is present. Mild coronary artery calcifications present. Global cardiac size within normal limits. Central pulmonary arteries are of normal caliber. Mild atherosclerotic calcification within the thoracic aorta. No aortic aneurysm. Mediastinum/Nodes: No pathologic thoracic adenopathy. The esophagus is patulous. The visualized thyroid is unremarkable. Lungs/Pleura: There is partial left lower lobe collapse with peripheral consolidation possibly reflecting a superimposed peripheral  pulmonary infarct. Stable 5 mm noncalcified pulmonary nodule within the right middle lobe. Additional smaller nodules are also unchanged. No new focal pulmonary nodules identified. No pneumothorax or pleural effusion. Central airways are unremarkable. Upper Abdomen: Status post cholecystectomy.  No acute abnormality. Musculoskeletal: Thoracal lumbar fusion and cervicothoracic fusion hardware is identified. Ankylosis of the thoracic spine is again identified in keeping with changes of ankylosing spondylitis. Chance fracture at T11-12 noted, unchanged from intraoperative examination of 11/27/2020. No acute bone abnormality. Review of the MIP images confirms the above findings. IMPRESSION: Acute pulmonary embolism. Moderate embolic burden. While the RV/LV ratio is abnormal, this appears stable since prior examination. Echocardiography may be helpful to assess for evidence of acute right heart strain. Small left lower lobe pulmonary infarct. Multiple stable small pulmonary nodules within the right lung, largest within the right middle lobe, safely considered benign. Mild coronary artery calcification. Findings in keeping with ankylosing spondylitis with Chance fracture at T11-12, unchanged from prior  examination status post surgical fixation. Aortic Atherosclerosis (ICD10-I70.0). These results were called by telephone at the time of interpretation on 02/25/2021 at 1:36 am to provider [facility] , who verbally acknowledged these results. Electronically Signed   By: Fidela Salisbury M.D.   On: 02/25/2021 01:40   DG Abd 2 Views  Result Date: 02/25/2021 CLINICAL DATA:  Acute distention of stomach EXAM: ABDOMEN - 2 VIEW COMPARISON:  06/29/2020 FINDINGS: There is normal bowel gas pattern. No free air. No organomegaly or suspicious calcification. No acute bony abnormality. Postoperative changes from posterior fusion in the thoracolumbar spine. IMPRESSION: No acute findings. Electronically Signed   By: Rolm Baptise M.D.   On: 02/25/2021 17:42      Management plans discussed with the patient, family and they are in agreement.  CODE STATUS:  Code Status History     Date Active Date Inactive Code Status Order ID Comments User Context   02/25/2021 0204 02/26/2021 1647 Full Code [id number]  Athena Masse, MD ED   02/20/2021 2221 02/21/2021 2047 Full Code [id number]  Cox, Amy N, DO ED   11/23/2020 1429 11/30/2020 2157 Full Code [id number]  Lorella Nimrod, MD Inpatient   11/22/2020 1306 11/23/2020 1429 Partial Code [id number]  Ivor Costa, MD ED   10/26/2020 1906 11/01/2020 1902 Full Code [id number]  Cox, Briant Cedar, DO ED      Advance Directive Documentation    Flowsheet Row Most Recent Value  Type of Advance Directive Healthcare Power of Attorney, Living will  Pre-existing out of facility DNR order (yellow form or pink MOST form) --  "MOST" Form in Place? --       TOTAL TIME TAKING CARE OF THIS PATIENT: 34 minutes.    Loletha Grayer M.D on 02/26/2021 at 4:56 PM   [facility]  CC: Primary care physician; Idelle Crouch, MD

## 2021-02-27 DIAGNOSIS — S22082D Unstable burst fracture of T11-T12 vertebra, subsequent encounter for fracture with routine healing: Secondary | ICD-10-CM | POA: Diagnosis not present

## 2021-02-27 DIAGNOSIS — S32019D Unspecified fracture of first lumbar vertebra, subsequent encounter for fracture with routine healing: Secondary | ICD-10-CM | POA: Diagnosis not present

## 2021-02-27 DIAGNOSIS — G454 Transient global amnesia: Secondary | ICD-10-CM | POA: Diagnosis not present

## 2021-02-27 DIAGNOSIS — I10 Essential (primary) hypertension: Secondary | ICD-10-CM | POA: Diagnosis not present

## 2021-02-27 DIAGNOSIS — D696 Thrombocytopenia, unspecified: Secondary | ICD-10-CM | POA: Diagnosis not present

## 2021-02-27 DIAGNOSIS — M4324 Fusion of spine, thoracic region: Secondary | ICD-10-CM | POA: Diagnosis not present

## 2021-02-27 DIAGNOSIS — E559 Vitamin D deficiency, unspecified: Secondary | ICD-10-CM | POA: Diagnosis not present

## 2021-02-27 DIAGNOSIS — E785 Hyperlipidemia, unspecified: Secondary | ICD-10-CM | POA: Diagnosis not present

## 2021-02-27 DIAGNOSIS — K2100 Gastro-esophageal reflux disease with esophagitis, without bleeding: Secondary | ICD-10-CM | POA: Diagnosis not present

## 2021-02-27 DIAGNOSIS — R296 Repeated falls: Secondary | ICD-10-CM | POA: Diagnosis not present

## 2021-02-27 DIAGNOSIS — R350 Frequency of micturition: Secondary | ICD-10-CM | POA: Diagnosis not present

## 2021-02-27 DIAGNOSIS — J449 Chronic obstructive pulmonary disease, unspecified: Secondary | ICD-10-CM | POA: Diagnosis not present

## 2021-02-27 DIAGNOSIS — G4733 Obstructive sleep apnea (adult) (pediatric): Secondary | ICD-10-CM | POA: Diagnosis not present

## 2021-02-27 DIAGNOSIS — M1990 Unspecified osteoarthritis, unspecified site: Secondary | ICD-10-CM | POA: Diagnosis not present

## 2021-02-27 DIAGNOSIS — H409 Unspecified glaucoma: Secondary | ICD-10-CM | POA: Diagnosis not present

## 2021-02-27 DIAGNOSIS — E538 Deficiency of other specified B group vitamins: Secondary | ICD-10-CM | POA: Diagnosis not present

## 2021-02-27 DIAGNOSIS — E669 Obesity, unspecified: Secondary | ICD-10-CM | POA: Diagnosis not present

## 2021-02-27 DIAGNOSIS — R351 Nocturia: Secondary | ICD-10-CM | POA: Diagnosis not present

## 2021-02-27 DIAGNOSIS — F419 Anxiety disorder, unspecified: Secondary | ICD-10-CM | POA: Diagnosis not present

## 2021-02-27 DIAGNOSIS — M5440 Lumbago with sciatica, unspecified side: Secondary | ICD-10-CM | POA: Diagnosis not present

## 2021-02-27 DIAGNOSIS — R338 Other retention of urine: Secondary | ICD-10-CM | POA: Diagnosis not present

## 2021-02-27 DIAGNOSIS — N401 Enlarged prostate with lower urinary tract symptoms: Secondary | ICD-10-CM | POA: Diagnosis not present

## 2021-02-27 DIAGNOSIS — W19XXXD Unspecified fall, subsequent encounter: Secondary | ICD-10-CM | POA: Diagnosis not present

## 2021-02-27 DIAGNOSIS — I739 Peripheral vascular disease, unspecified: Secondary | ICD-10-CM | POA: Diagnosis not present

## 2021-02-27 DIAGNOSIS — I2510 Atherosclerotic heart disease of native coronary artery without angina pectoris: Secondary | ICD-10-CM | POA: Diagnosis not present

## 2021-03-01 DIAGNOSIS — Z79899 Other long term (current) drug therapy: Secondary | ICD-10-CM | POA: Diagnosis not present

## 2021-03-01 DIAGNOSIS — I5032 Chronic diastolic (congestive) heart failure: Secondary | ICD-10-CM | POA: Diagnosis not present

## 2021-03-01 DIAGNOSIS — R739 Hyperglycemia, unspecified: Secondary | ICD-10-CM | POA: Diagnosis not present

## 2021-03-01 DIAGNOSIS — I517 Cardiomegaly: Secondary | ICD-10-CM | POA: Diagnosis not present

## 2021-03-01 DIAGNOSIS — I2699 Other pulmonary embolism without acute cor pulmonale: Secondary | ICD-10-CM | POA: Diagnosis not present

## 2021-03-01 DIAGNOSIS — E782 Mixed hyperlipidemia: Secondary | ICD-10-CM | POA: Diagnosis not present

## 2021-03-01 DIAGNOSIS — R400 Somnolence: Secondary | ICD-10-CM | POA: Diagnosis not present

## 2021-03-01 DIAGNOSIS — I10 Essential (primary) hypertension: Secondary | ICD-10-CM | POA: Diagnosis not present

## 2021-03-01 DIAGNOSIS — D696 Thrombocytopenia, unspecified: Secondary | ICD-10-CM | POA: Diagnosis not present

## 2021-03-01 DIAGNOSIS — I509 Heart failure, unspecified: Secondary | ICD-10-CM | POA: Diagnosis not present

## 2021-03-05 ENCOUNTER — Ambulatory Visit: Payer: PPO | Attending: Family | Admitting: Family

## 2021-03-05 ENCOUNTER — Other Ambulatory Visit: Payer: Self-pay

## 2021-03-05 ENCOUNTER — Encounter: Payer: Self-pay | Admitting: Family

## 2021-03-05 VITALS — BP 101/60 | HR 61 | Resp 16 | Ht 69.0 in | Wt 211.5 lb

## 2021-03-05 DIAGNOSIS — I739 Peripheral vascular disease, unspecified: Secondary | ICD-10-CM | POA: Insufficient documentation

## 2021-03-05 DIAGNOSIS — I5022 Chronic systolic (congestive) heart failure: Secondary | ICD-10-CM | POA: Insufficient documentation

## 2021-03-05 DIAGNOSIS — I110 Hypertensive heart disease with heart failure: Secondary | ICD-10-CM | POA: Insufficient documentation

## 2021-03-05 DIAGNOSIS — G4730 Sleep apnea, unspecified: Secondary | ICD-10-CM | POA: Diagnosis not present

## 2021-03-05 DIAGNOSIS — J449 Chronic obstructive pulmonary disease, unspecified: Secondary | ICD-10-CM | POA: Diagnosis not present

## 2021-03-05 DIAGNOSIS — R1310 Dysphagia, unspecified: Secondary | ICD-10-CM | POA: Diagnosis not present

## 2021-03-05 DIAGNOSIS — E785 Hyperlipidemia, unspecified: Secondary | ICD-10-CM | POA: Insufficient documentation

## 2021-03-05 DIAGNOSIS — Z8673 Personal history of transient ischemic attack (TIA), and cerebral infarction without residual deficits: Secondary | ICD-10-CM | POA: Diagnosis not present

## 2021-03-05 DIAGNOSIS — Z79899 Other long term (current) drug therapy: Secondary | ICD-10-CM | POA: Insufficient documentation

## 2021-03-05 DIAGNOSIS — I2510 Atherosclerotic heart disease of native coronary artery without angina pectoris: Secondary | ICD-10-CM | POA: Diagnosis not present

## 2021-03-05 DIAGNOSIS — I952 Hypotension due to drugs: Secondary | ICD-10-CM | POA: Diagnosis not present

## 2021-03-05 DIAGNOSIS — K219 Gastro-esophageal reflux disease without esophagitis: Secondary | ICD-10-CM | POA: Insufficient documentation

## 2021-03-05 DIAGNOSIS — J45909 Unspecified asthma, uncomplicated: Secondary | ICD-10-CM | POA: Diagnosis not present

## 2021-03-05 DIAGNOSIS — I959 Hypotension, unspecified: Secondary | ICD-10-CM | POA: Diagnosis not present

## 2021-03-05 NOTE — Progress Notes (Signed)
PATIENT NAME: Keyshun Elpers  MRN:  [id number] DATE OF BIRTH:  1935/11/25  LANGLEY INGALLS is a 86 y.o. male with medical history significant for COPD followed by pulmonology, CAD, prior stroke, HTN, hyperlipidemia, anxiety, asthma, GERD, PVD, sleep apnea, burst fracture of thoracic vertebra October 2022.  Echo 02/21/21 - Left Ventricle: Left ventricular ejection fraction, by estimation, is 55 to 60%. The left ventricle has normal function. The left ventricle has no regional wall motion abnormalities. The left ventricular internal cavity size was normal in size. There is mild left ventricular hypertrophy. Left ventricular diastolic function could not be evaluated.   Admitted 02/24/21 due to acute on chronic heart failure along with pulmonary embolism. Was placed on IV heparin then transitioned to Eliquis. Required IV lasix twice. Metabolic encephalopathy, felt to have some underlying cognitive decline. Discharged home with [facility] after 3 days. Was in the ED 02/20/21 due to pedal edema where he was treated and released.   He presents today for his initial visit to the heart failure clinic with a chief complaint of fatigue, SOB, dizziness, pedal edema and cough. This has been going on most noticeably since his hospital discharge.  His grandson and significant other recently moved in to help care for him and help provide this history. Family does endorse a steady decline prior to hospitalization, that they attributed to "just getting older".  He denies CP, palpitations, orthopnea, syncope.   Since his discharge, he has been weighing daily and reports that his weight does not fluctuate more than 3 lbs in either direction.   Past Medical History:  Diagnosis Date   Anxiety    Arthritis    Asthma    GERD (gastroesophageal reflux disease)    Glaucoma    Glaucoma    History of stroke    Hyperlipidemia    Hyperlipidemia    Hypertension    Peripheral vascular disease ([place])    Sleep apnea    Past Surgical  History:  Procedure Laterality Date   REPLACEMENT TOTAL KNEE BILATERAL     Family History  Problem Relation Age of Onset   Prostate cancer Father    Social History   Tobacco Use   Smoking status: Never   Smokeless tobacco: Never  Substance Use Topics   Alcohol use: No   No Known Allergies  Prior to Admission medications   Medication Sig Start Date End Date Taking? Authorizing Provider  acetaminophen (TYLENOL) 500 MG tablet Take 2 tablets (1,000 mg total) by mouth 3 (three) times daily as needed. Home med. 02/21/21  Yes Enzo Bi, MD  apixaban [facility]) 5 MG TABS tablet Take two tablets twice a day for six days then one tablet twice a day afterwards 02/26/21  Yes Wieting, Richard, MD  bisacodyl (DULCOLAX) 10 MG suppository Place 1 suppository (10 mg total) rectally daily as needed for moderate constipation. 10/31/20  Yes Bonnielee Haff, MD  cetirizine (ZYRTEC) 10 MG tablet Take 10 mg by mouth at bedtime.   Yes [provider]  Cyanocobalamin (VITAMIN B 12) 500 MCG TABS Take 500 mcg by mouth daily. 02/26/21  Yes Wieting, Richard, MD  Dorzolamide HCl-Timolol Mal PF 22.3-6.8 MG/ML SOLN Place 1 drop into the right eye 2 (two) times daily.   Yes [provider]  ergocalciferol (VITAMIN D2) 1.25 MG (50000 UT) capsule Take 50,000 Units by mouth once a week. 11/01/20  Yes [provider]  latanoprost (XALATAN) 0.005 % ophthalmic solution Place 1 drop into both eyes at bedtime.  Yes [provider]  losartan (COZAAR) 50 MG tablet Take 50 mg by mouth 2 (two) times daily.   Yes [provider]  metoprolol succinate (TOPROL-XL) 50 MG 24 hr tablet Take 2 tablets (100 mg total) by mouth daily. Home med. 02/21/21  Yes Enzo Bi, MD  pantoprazole (PROTONIX) 40 MG tablet Take 40 mg by mouth daily as needed. 11/01/20  Yes [provider]  potassium chloride SA (KLOR-CON M) 20 MEQ tablet Take 1 tablet (20 mEq total) by mouth daily. 02/26/21  Yes Wieting,  Richard, MD  rosuvastatin (CRESTOR) 10 MG tablet Take 10 mg by mouth at bedtime.   Yes [provider]  senna-docusate (SENOKOT-S) 8.6-50 MG tablet Take 2 tablets by mouth 2 (two) times daily. While you are on pain medication. 11/30/20  Yes Enzo Bi, MD  tamsulosin (FLOMAX) 0.4 MG CAPS capsule Take 2 capsules (0.8 mg total) by mouth daily after supper. 10/31/20  Yes Bonnielee Haff, MD  torsemide [facility]) 20 MG tablet Take 2 tablets po daily in am 02/27/21  Yes Wieting, Richard, MD  triamcinolone cream (KENALOG) 0.1 % Apply 1 application topically 3 (three) times daily. (Apply to rash on legs)   Yes [provider]    Review of Systems  Constitutional:  Positive for malaise/fatigue and weight loss.  HENT:  Positive for hearing loss.   Eyes:  Negative for blurred vision and double vision.  Respiratory:  Positive for cough and shortness of breath. Negative for sputum production and wheezing.   Cardiovascular:  Positive for leg swelling. Negative for chest pain, palpitations and orthopnea.  Gastrointestinal:        Dysphagia at baseline, esophagus has been stretched "as many times as it can be"  Genitourinary: Negative.   Musculoskeletal:  Positive for back pain and falls.  Skin: Negative.   Neurological:  Positive for dizziness. Negative for headaches.  Endo/Heme/Allergies: Negative.   Psychiatric/Behavioral: Negative.      Physical Exam Vitals and nursing note reviewed. Exam conducted with a chaperone present (grandson and his SO).  Constitutional:      General: He is not in acute distress.    Appearance: He is ill-appearing. He is not toxic-appearing.  Cardiovascular:     Rate and Rhythm: Normal rate and regular rhythm.     Heart sounds: No murmur heard. Pulmonary:     Effort: No respiratory distress.     Breath sounds: No wheezing, rhonchi or rales.  Abdominal:     Palpations: Abdomen is soft.  Musculoskeletal:     Right lower leg: Edema (+1) present.     Left  lower leg: Edema (+1) present.  Skin:    General: Skin is warm and dry.  Neurological:     Mental Status: He is alert and oriented to person, place, and time.  Psychiatric:        Mood and Affect: Mood normal.    Filed Weights   03/05/21 1337  Weight: 211 lb 8 oz (95.9 kg)    Lab Results  Component Value Date   CREATININE 0.95 02/26/2021   CREATININE 0.78 02/24/2021   CREATININE 0.88 02/21/2021    Vitals with BMI 03/05/2021 02/26/2021 02/26/2021  Height 5\' 9"  - -  Weight 211 lbs 8 oz - -  BMI 09.81 - -  Systolic 191 478 295  Diastolic 60 73 74  Pulse 61 79 83   Assessment & Plan:  Heart failure with mildly reduced ejection fraction- - NYHA III - euvolemic today -  weighing daily  - discussed importance of weighing daily and parameters to call office - provided low sodium cookbook - on torsemide, dose was increased by PCP from 20 mg QD to 40 mg QD d/t persistent pedal edema - was on losartan, d/c'd by PCP last week d/t hypotension - discussed importance of compression stockings - on metoprolol 100 mg QD - will decrease dose of metoprolol to 50 mg QD d/t persistent dizziness - may consider SGLT2 once BP has stabilized - encouraged him to get established with a cardiologist  2. Hypotension - BP in office today, 101/60 - family reports they are checking daily and it is not usually above "58's" - family reports he has difficulty with postural changes and frequently feels dizzy - will decrease his metoprolol to 50 mg QD - PCP d/c'd his losartan and flexeril last week  - will see his PCP in two weeks  3. Dysphagia - family reports he cannot keep anything solid down, that he coughs until he eventually coughs it up - consuming mostly pureed and soft foods - report his esophagus has been stretched "as much as it can" in the past by GI - encouraged protein shakes  Medications reviewed.  Return in 1 month.

## 2021-03-05 NOTE — Patient Instructions (Addendum)
The Heart Failure Clinic will be moving around the corner to [address] mid-February. Our phone number will remain the same.  Get some compression socks from the pharmacy. Place on in am, remove before bedtime.  Continue to weigh daily. If there is a weight gain of 2-3/lbs over night or 5 lbs/week, call the HF clinic.   When you return to see Dr. Doy Hutching, discuss a cardiologist with him.  Start taking metoprolol 50 mg every day, HALF of the current metoprolol pill daily.   Consider protein shakes to help with calorie intake.   Continue to watch your sodium intake.

## 2021-03-07 DIAGNOSIS — I2510 Atherosclerotic heart disease of native coronary artery without angina pectoris: Secondary | ICD-10-CM | POA: Diagnosis not present

## 2021-03-07 DIAGNOSIS — R296 Repeated falls: Secondary | ICD-10-CM | POA: Diagnosis not present

## 2021-03-07 DIAGNOSIS — M5440 Lumbago with sciatica, unspecified side: Secondary | ICD-10-CM | POA: Diagnosis not present

## 2021-03-07 DIAGNOSIS — S32019D Unspecified fracture of first lumbar vertebra, subsequent encounter for fracture with routine healing: Secondary | ICD-10-CM | POA: Diagnosis not present

## 2021-03-07 DIAGNOSIS — J449 Chronic obstructive pulmonary disease, unspecified: Secondary | ICD-10-CM | POA: Diagnosis not present

## 2021-03-07 DIAGNOSIS — F419 Anxiety disorder, unspecified: Secondary | ICD-10-CM | POA: Diagnosis not present

## 2021-03-07 DIAGNOSIS — E669 Obesity, unspecified: Secondary | ICD-10-CM | POA: Diagnosis not present

## 2021-03-07 DIAGNOSIS — G4733 Obstructive sleep apnea (adult) (pediatric): Secondary | ICD-10-CM | POA: Diagnosis not present

## 2021-03-07 DIAGNOSIS — I10 Essential (primary) hypertension: Secondary | ICD-10-CM | POA: Diagnosis not present

## 2021-03-07 DIAGNOSIS — G454 Transient global amnesia: Secondary | ICD-10-CM | POA: Diagnosis not present

## 2021-03-07 DIAGNOSIS — R338 Other retention of urine: Secondary | ICD-10-CM | POA: Diagnosis not present

## 2021-03-07 DIAGNOSIS — N401 Enlarged prostate with lower urinary tract symptoms: Secondary | ICD-10-CM | POA: Diagnosis not present

## 2021-03-07 DIAGNOSIS — R351 Nocturia: Secondary | ICD-10-CM | POA: Diagnosis not present

## 2021-03-07 DIAGNOSIS — H409 Unspecified glaucoma: Secondary | ICD-10-CM | POA: Diagnosis not present

## 2021-03-07 DIAGNOSIS — W19XXXD Unspecified fall, subsequent encounter: Secondary | ICD-10-CM | POA: Diagnosis not present

## 2021-03-07 DIAGNOSIS — D696 Thrombocytopenia, unspecified: Secondary | ICD-10-CM | POA: Diagnosis not present

## 2021-03-07 DIAGNOSIS — K2100 Gastro-esophageal reflux disease with esophagitis, without bleeding: Secondary | ICD-10-CM | POA: Diagnosis not present

## 2021-03-07 DIAGNOSIS — S22082D Unstable burst fracture of T11-T12 vertebra, subsequent encounter for fracture with routine healing: Secondary | ICD-10-CM | POA: Diagnosis not present

## 2021-03-07 DIAGNOSIS — R350 Frequency of micturition: Secondary | ICD-10-CM | POA: Diagnosis not present

## 2021-03-07 DIAGNOSIS — E538 Deficiency of other specified B group vitamins: Secondary | ICD-10-CM | POA: Diagnosis not present

## 2021-03-07 DIAGNOSIS — I739 Peripheral vascular disease, unspecified: Secondary | ICD-10-CM | POA: Diagnosis not present

## 2021-03-07 DIAGNOSIS — E559 Vitamin D deficiency, unspecified: Secondary | ICD-10-CM | POA: Diagnosis not present

## 2021-03-07 DIAGNOSIS — M1990 Unspecified osteoarthritis, unspecified site: Secondary | ICD-10-CM | POA: Diagnosis not present

## 2021-03-07 DIAGNOSIS — E785 Hyperlipidemia, unspecified: Secondary | ICD-10-CM | POA: Diagnosis not present

## 2021-03-08 DIAGNOSIS — R338 Other retention of urine: Secondary | ICD-10-CM | POA: Diagnosis not present

## 2021-03-08 DIAGNOSIS — K2100 Gastro-esophageal reflux disease with esophagitis, without bleeding: Secondary | ICD-10-CM | POA: Diagnosis not present

## 2021-03-08 DIAGNOSIS — F419 Anxiety disorder, unspecified: Secondary | ICD-10-CM | POA: Diagnosis not present

## 2021-03-08 DIAGNOSIS — E669 Obesity, unspecified: Secondary | ICD-10-CM | POA: Diagnosis not present

## 2021-03-08 DIAGNOSIS — N401 Enlarged prostate with lower urinary tract symptoms: Secondary | ICD-10-CM | POA: Diagnosis not present

## 2021-03-08 DIAGNOSIS — D696 Thrombocytopenia, unspecified: Secondary | ICD-10-CM | POA: Diagnosis not present

## 2021-03-08 DIAGNOSIS — S22082D Unstable burst fracture of T11-T12 vertebra, subsequent encounter for fracture with routine healing: Secondary | ICD-10-CM | POA: Diagnosis not present

## 2021-03-08 DIAGNOSIS — I2510 Atherosclerotic heart disease of native coronary artery without angina pectoris: Secondary | ICD-10-CM | POA: Diagnosis not present

## 2021-03-08 DIAGNOSIS — R296 Repeated falls: Secondary | ICD-10-CM | POA: Diagnosis not present

## 2021-03-08 DIAGNOSIS — H409 Unspecified glaucoma: Secondary | ICD-10-CM | POA: Diagnosis not present

## 2021-03-08 DIAGNOSIS — W19XXXD Unspecified fall, subsequent encounter: Secondary | ICD-10-CM | POA: Diagnosis not present

## 2021-03-08 DIAGNOSIS — S32019D Unspecified fracture of first lumbar vertebra, subsequent encounter for fracture with routine healing: Secondary | ICD-10-CM | POA: Diagnosis not present

## 2021-03-08 DIAGNOSIS — M5440 Lumbago with sciatica, unspecified side: Secondary | ICD-10-CM | POA: Diagnosis not present

## 2021-03-08 DIAGNOSIS — G454 Transient global amnesia: Secondary | ICD-10-CM | POA: Diagnosis not present

## 2021-03-08 DIAGNOSIS — R350 Frequency of micturition: Secondary | ICD-10-CM | POA: Diagnosis not present

## 2021-03-08 DIAGNOSIS — I739 Peripheral vascular disease, unspecified: Secondary | ICD-10-CM | POA: Diagnosis not present

## 2021-03-08 DIAGNOSIS — G4733 Obstructive sleep apnea (adult) (pediatric): Secondary | ICD-10-CM | POA: Diagnosis not present

## 2021-03-08 DIAGNOSIS — M1990 Unspecified osteoarthritis, unspecified site: Secondary | ICD-10-CM | POA: Diagnosis not present

## 2021-03-08 DIAGNOSIS — E559 Vitamin D deficiency, unspecified: Secondary | ICD-10-CM | POA: Diagnosis not present

## 2021-03-08 DIAGNOSIS — E785 Hyperlipidemia, unspecified: Secondary | ICD-10-CM | POA: Diagnosis not present

## 2021-03-08 DIAGNOSIS — J449 Chronic obstructive pulmonary disease, unspecified: Secondary | ICD-10-CM | POA: Diagnosis not present

## 2021-03-08 DIAGNOSIS — E538 Deficiency of other specified B group vitamins: Secondary | ICD-10-CM | POA: Diagnosis not present

## 2021-03-08 DIAGNOSIS — I10 Essential (primary) hypertension: Secondary | ICD-10-CM | POA: Diagnosis not present

## 2021-03-08 DIAGNOSIS — R351 Nocturia: Secondary | ICD-10-CM | POA: Diagnosis not present

## 2021-03-19 DIAGNOSIS — G4733 Obstructive sleep apnea (adult) (pediatric): Secondary | ICD-10-CM | POA: Diagnosis not present

## 2021-03-19 DIAGNOSIS — K2100 Gastro-esophageal reflux disease with esophagitis, without bleeding: Secondary | ICD-10-CM | POA: Diagnosis not present

## 2021-03-19 DIAGNOSIS — I2510 Atherosclerotic heart disease of native coronary artery without angina pectoris: Secondary | ICD-10-CM | POA: Diagnosis not present

## 2021-03-19 DIAGNOSIS — N401 Enlarged prostate with lower urinary tract symptoms: Secondary | ICD-10-CM | POA: Diagnosis not present

## 2021-03-19 DIAGNOSIS — W19XXXD Unspecified fall, subsequent encounter: Secondary | ICD-10-CM | POA: Diagnosis not present

## 2021-03-19 DIAGNOSIS — F419 Anxiety disorder, unspecified: Secondary | ICD-10-CM | POA: Diagnosis not present

## 2021-03-19 DIAGNOSIS — E538 Deficiency of other specified B group vitamins: Secondary | ICD-10-CM | POA: Diagnosis not present

## 2021-03-19 DIAGNOSIS — R296 Repeated falls: Secondary | ICD-10-CM | POA: Diagnosis not present

## 2021-03-19 DIAGNOSIS — H409 Unspecified glaucoma: Secondary | ICD-10-CM | POA: Diagnosis not present

## 2021-03-19 DIAGNOSIS — E669 Obesity, unspecified: Secondary | ICD-10-CM | POA: Diagnosis not present

## 2021-03-19 DIAGNOSIS — J449 Chronic obstructive pulmonary disease, unspecified: Secondary | ICD-10-CM | POA: Diagnosis not present

## 2021-03-19 DIAGNOSIS — S22082D Unstable burst fracture of T11-T12 vertebra, subsequent encounter for fracture with routine healing: Secondary | ICD-10-CM | POA: Diagnosis not present

## 2021-03-19 DIAGNOSIS — D696 Thrombocytopenia, unspecified: Secondary | ICD-10-CM | POA: Diagnosis not present

## 2021-03-19 DIAGNOSIS — R351 Nocturia: Secondary | ICD-10-CM | POA: Diagnosis not present

## 2021-03-19 DIAGNOSIS — S32019D Unspecified fracture of first lumbar vertebra, subsequent encounter for fracture with routine healing: Secondary | ICD-10-CM | POA: Diagnosis not present

## 2021-03-19 DIAGNOSIS — G454 Transient global amnesia: Secondary | ICD-10-CM | POA: Diagnosis not present

## 2021-03-19 DIAGNOSIS — M1990 Unspecified osteoarthritis, unspecified site: Secondary | ICD-10-CM | POA: Diagnosis not present

## 2021-03-19 DIAGNOSIS — E559 Vitamin D deficiency, unspecified: Secondary | ICD-10-CM | POA: Diagnosis not present

## 2021-03-19 DIAGNOSIS — R350 Frequency of micturition: Secondary | ICD-10-CM | POA: Diagnosis not present

## 2021-03-19 DIAGNOSIS — M5440 Lumbago with sciatica, unspecified side: Secondary | ICD-10-CM | POA: Diagnosis not present

## 2021-03-19 DIAGNOSIS — I10 Essential (primary) hypertension: Secondary | ICD-10-CM | POA: Diagnosis not present

## 2021-03-19 DIAGNOSIS — I739 Peripheral vascular disease, unspecified: Secondary | ICD-10-CM | POA: Diagnosis not present

## 2021-03-19 DIAGNOSIS — E785 Hyperlipidemia, unspecified: Secondary | ICD-10-CM | POA: Diagnosis not present

## 2021-03-19 DIAGNOSIS — R338 Other retention of urine: Secondary | ICD-10-CM | POA: Diagnosis not present

## 2021-03-21 DIAGNOSIS — I110 Hypertensive heart disease with heart failure: Secondary | ICD-10-CM | POA: Diagnosis not present

## 2021-03-21 DIAGNOSIS — J449 Chronic obstructive pulmonary disease, unspecified: Secondary | ICD-10-CM | POA: Diagnosis not present

## 2021-03-21 DIAGNOSIS — D696 Thrombocytopenia, unspecified: Secondary | ICD-10-CM | POA: Diagnosis not present

## 2021-03-21 DIAGNOSIS — Z0000 Encounter for general adult medical examination without abnormal findings: Secondary | ICD-10-CM | POA: Diagnosis not present

## 2021-03-21 DIAGNOSIS — E782 Mixed hyperlipidemia: Secondary | ICD-10-CM | POA: Diagnosis not present

## 2021-03-21 DIAGNOSIS — I2510 Atherosclerotic heart disease of native coronary artery without angina pectoris: Secondary | ICD-10-CM | POA: Diagnosis not present

## 2021-03-21 DIAGNOSIS — I5032 Chronic diastolic (congestive) heart failure: Secondary | ICD-10-CM | POA: Diagnosis not present

## 2021-03-21 DIAGNOSIS — I509 Heart failure, unspecified: Secondary | ICD-10-CM | POA: Diagnosis not present

## 2021-03-21 DIAGNOSIS — I10 Essential (primary) hypertension: Secondary | ICD-10-CM | POA: Diagnosis not present

## 2021-03-21 DIAGNOSIS — I2693 Single subsegmental pulmonary embolism without acute cor pulmonale: Secondary | ICD-10-CM | POA: Diagnosis not present

## 2021-03-21 DIAGNOSIS — S22001S Stable burst fracture of unspecified thoracic vertebra, sequela: Secondary | ICD-10-CM | POA: Diagnosis not present

## 2021-03-21 DIAGNOSIS — R739 Hyperglycemia, unspecified: Secondary | ICD-10-CM | POA: Diagnosis not present

## 2021-03-21 DIAGNOSIS — I739 Peripheral vascular disease, unspecified: Secondary | ICD-10-CM | POA: Diagnosis not present

## 2021-03-23 DIAGNOSIS — J449 Chronic obstructive pulmonary disease, unspecified: Secondary | ICD-10-CM | POA: Diagnosis not present

## 2021-03-23 DIAGNOSIS — N401 Enlarged prostate with lower urinary tract symptoms: Secondary | ICD-10-CM | POA: Diagnosis not present

## 2021-03-23 DIAGNOSIS — H409 Unspecified glaucoma: Secondary | ICD-10-CM | POA: Diagnosis not present

## 2021-03-23 DIAGNOSIS — I2510 Atherosclerotic heart disease of native coronary artery without angina pectoris: Secondary | ICD-10-CM | POA: Diagnosis not present

## 2021-03-23 DIAGNOSIS — D696 Thrombocytopenia, unspecified: Secondary | ICD-10-CM | POA: Diagnosis not present

## 2021-03-23 DIAGNOSIS — F419 Anxiety disorder, unspecified: Secondary | ICD-10-CM | POA: Diagnosis not present

## 2021-03-23 DIAGNOSIS — S32019D Unspecified fracture of first lumbar vertebra, subsequent encounter for fracture with routine healing: Secondary | ICD-10-CM | POA: Diagnosis not present

## 2021-03-23 DIAGNOSIS — G454 Transient global amnesia: Secondary | ICD-10-CM | POA: Diagnosis not present

## 2021-03-23 DIAGNOSIS — E785 Hyperlipidemia, unspecified: Secondary | ICD-10-CM | POA: Diagnosis not present

## 2021-03-23 DIAGNOSIS — K2100 Gastro-esophageal reflux disease with esophagitis, without bleeding: Secondary | ICD-10-CM | POA: Diagnosis not present

## 2021-03-23 DIAGNOSIS — G4733 Obstructive sleep apnea (adult) (pediatric): Secondary | ICD-10-CM | POA: Diagnosis not present

## 2021-03-23 DIAGNOSIS — I110 Hypertensive heart disease with heart failure: Secondary | ICD-10-CM | POA: Diagnosis not present

## 2021-03-23 DIAGNOSIS — R351 Nocturia: Secondary | ICD-10-CM | POA: Diagnosis not present

## 2021-03-23 DIAGNOSIS — I739 Peripheral vascular disease, unspecified: Secondary | ICD-10-CM | POA: Diagnosis not present

## 2021-03-23 DIAGNOSIS — M1990 Unspecified osteoarthritis, unspecified site: Secondary | ICD-10-CM | POA: Diagnosis not present

## 2021-03-23 DIAGNOSIS — I2693 Single subsegmental pulmonary embolism without acute cor pulmonale: Secondary | ICD-10-CM | POA: Diagnosis not present

## 2021-03-23 DIAGNOSIS — E559 Vitamin D deficiency, unspecified: Secondary | ICD-10-CM | POA: Diagnosis not present

## 2021-03-23 DIAGNOSIS — R338 Other retention of urine: Secondary | ICD-10-CM | POA: Diagnosis not present

## 2021-03-23 DIAGNOSIS — S22082D Unstable burst fracture of T11-T12 vertebra, subsequent encounter for fracture with routine healing: Secondary | ICD-10-CM | POA: Diagnosis not present

## 2021-03-23 DIAGNOSIS — I509 Heart failure, unspecified: Secondary | ICD-10-CM | POA: Diagnosis not present

## 2021-03-23 DIAGNOSIS — R350 Frequency of micturition: Secondary | ICD-10-CM | POA: Diagnosis not present

## 2021-03-23 DIAGNOSIS — R296 Repeated falls: Secondary | ICD-10-CM | POA: Diagnosis not present

## 2021-03-23 DIAGNOSIS — M5440 Lumbago with sciatica, unspecified side: Secondary | ICD-10-CM | POA: Diagnosis not present

## 2021-03-23 DIAGNOSIS — E538 Deficiency of other specified B group vitamins: Secondary | ICD-10-CM | POA: Diagnosis not present

## 2021-04-03 NOTE — Progress Notes (Signed)
Patient ID: Chris Kent, male    DOB: April 22, 1935, 86 y.o.   MRN: [id number]  HPI  IHAN PAT is a 86 y.o. male with medical history significant for COPD followed by pulmonology, CAD, prior stroke, HTN, hyperlipidemia, anxiety, asthma, GERD, PVD, sleep apnea, burst fracture of thoracic vertebra October 2022.  Echo 02/21/21 - Left Ventricle: Left ventricular ejection fraction, by estimation, is 55 to 60%. The left ventricle has normal function. The left ventricle has no regional wall motion abnormalities. The left ventricular internal cavity size was normal in size. There is mild left ventricular hypertrophy. Left ventricular diastolic function could not be evaluated.   Admitted 02/24/21 due to acute on chronic heart failure along with pulmonary embolism. Was placed on IV heparin then transitioned to Eliquis. Required IV lasix twice. Metabolic encephalopathy, felt to have some underlying cognitive decline. Discharged home with [facility] after 3 days. Was in the ED 02/20/21 due to pedal edema where he was treated and released.   He presents today for a follow-up visit to the heart failure clinic with a chief complaint of moderate shortness of breath with minimal exertion. He describes this as chronic in nature. He has associated cough, fatigue, intermittent chest pain, leg edema, difficulty sleeping and light-headedness along with this. He denies any palpitations, abdominal distention or weight gain.   [place] getting off the elevator coming to his appointment. Said the door was shutting and he couldn't get through. Said that his left side hit the wall and the left side of his head also hit the wall. He says that his right knee hit the floor. Currently denies any pain.   Past Medical History:  Diagnosis Date   Anxiety    Arthritis    Asthma    CHF (congestive heart failure) (HCC)    Coronary artery disease    GERD (gastroesophageal reflux disease)    Glaucoma    Glaucoma    History of stroke     Hyperlipidemia    Hyperlipidemia    Hypertension    Peripheral vascular disease ([place])    Sleep apnea    Stroke [facility])    Past Surgical History:  Procedure Laterality Date   REPLACEMENT TOTAL KNEE BILATERAL     Family History  Problem Relation Age of Onset   Prostate cancer Father    Social History   Tobacco Use   Smoking status: Never   Smokeless tobacco: Never  Substance Use Topics   Alcohol use: No   No Known Allergies Prior to Admission medications   Medication Sig Start Date End Date Taking? Authorizing Provider  apixaban (ELIQUIS) 5 MG TABS tablet Take two tablets twice a day for six days then one tablet twice a day afterwards 02/26/21  Yes Wieting, Richard, MD  cetirizine (ZYRTEC) 10 MG tablet Take 10 mg by mouth at bedtime.   Yes [provider]  Cyanocobalamin (VITAMIN B 12) 500 MCG TABS Take 500 mcg by mouth daily. 02/26/21  Yes Wieting, Richard, MD  Dorzolamide HCl-Timolol Mal PF 22.3-6.8 MG/ML SOLN Place 1 drop into the right eye 2 (two) times daily.   Yes [provider]  ergocalciferol (VITAMIN D2) 1.25 MG (50000 UT) capsule Take 50,000 Units by mouth once a week. 11/01/20  Yes [provider]  latanoprost (XALATAN) 0.005 % ophthalmic solution Place 1 drop into both eyes at bedtime.   Yes [provider]  metoprolol succinate (TOPROL-XL) 50 MG 24 hr tablet Take 2 tablets (100 mg total)  by mouth daily. Home med. Patient taking differently: Take 50 mg by mouth daily. HOLD if BP 681 systolic or 60 diatolic 1/57/26  Yes Enzo Bi, MD  midodrine (PROAMATINE) 2.5 MG tablet Take 1 tablet by mouth 2 (two) times daily. 03/21/21 03/21/22 Yes [provider]  potassium chloride SA (KLOR-CON M) 20 MEQ tablet Take 1 tablet (20 mEq total) by mouth daily. 02/26/21  Yes Wieting, Richard, MD  rosuvastatin (CRESTOR) 10 MG tablet Take 10 mg by mouth at bedtime.   Yes [provider]  tamsulosin (FLOMAX) 0.4 MG CAPS capsule Take 2  capsules (0.8 mg total) by mouth daily after supper. 10/31/20  Yes Bonnielee Haff, MD  torsemide [facility]) 20 MG tablet Take 2 tablets po daily in am 02/27/21  Yes Wieting, Richard, MD  triamcinolone cream (KENALOG) 0.1 % Apply 1 application topically 3 (three) times daily. (Apply to rash on legs)   Yes [provider]  senna-docusate (SENOKOT-S) 8.6-50 MG tablet Take 2 tablets by mouth 2 (two) times daily. While you are on pain medication. Patient not taking: Reported on 04/04/2021 11/30/20   Enzo Bi, MD   Review of Systems  Constitutional:  Positive for fatigue. Negative for appetite change.  HENT:  Negative for congestion, postnasal drip and sore throat.   Eyes: Negative.   Respiratory:  Positive for cough and shortness of breath.   Cardiovascular:  Positive for chest pain and leg swelling (L>R). Negative for palpitations.  Gastrointestinal:  Negative for abdominal distention and abdominal pain.  Endocrine: Negative.   Genitourinary: Negative.   Musculoskeletal:  Negative for back pain and neck pain.  Skin: Negative.   Allergic/Immunologic: Negative.   Neurological:  Positive for light-headedness and headaches.  Hematological:  Negative for adenopathy. Does not bruise/bleed easily.  Psychiatric/Behavioral:  Positive for sleep disturbance. Negative for dysphoric mood. The patient is not nervous/anxious.    Vitals:   04/04/21 1149  BP: 110/63  Pulse: 66  Resp: 14  SpO2: 100%  Weight: 219 lb 4 oz (99.5 kg)  Height: 5\' 9"  (1.753 m)   Wt Readings from Last 3 Encounters:  04/04/21 219 lb 4 oz (99.5 kg)  03/05/21 211 lb 8 oz (95.9 kg)  02/25/21 222 lb 3.6 oz (100.8 kg)   Lab Results  Component Value Date   CREATININE 0.95 02/26/2021   CREATININE 0.78 02/24/2021   CREATININE 0.88 02/21/2021   Physical Exam Vitals and nursing note reviewed. Exam conducted with a chaperone present (grandson and SO).  Constitutional:      Appearance: Normal appearance.  HENT:     Head:  Normocephalic and atraumatic.  Cardiovascular:     Rate and Rhythm: Normal rate and regular rhythm.  Pulmonary:     Effort: Pulmonary effort is normal. No respiratory distress.     Breath sounds: No wheezing or rales.  Abdominal:     General: There is no distension.     Palpations: Abdomen is soft.  Musculoskeletal:        General: No tenderness.     Cervical back: Normal range of motion and neck supple.     Right lower leg: Edema (1+ pitting) present.     Left lower leg: Edema (1+ pitting) present.     Comments: No tenderness to palpation over right knee, left shoulder, left temple or back of head. No bruising noted nor any swelling.   Skin:    General: Skin is warm and dry.  Neurological:     General: No focal deficit  present.     Mental Status: He is alert and oriented to person, place, and time.  Psychiatric:        Mood and Affect: Mood normal.        Behavior: Behavior normal.        Thought Content: Thought content normal.   Assessment & Plan:  Heart failure with mildly reduced ejection fraction- - NYHA III - euvolemic today - weighing daily; reminded to call for an overnight weight gain of > 2 pounds or a weekly weight gain of > 5 pounds - weight up 8 pounds from last visit here 1 month ago - discussed importance of compression stockings - on GDMT of metoprolol - BP too low for other GDMT - has upcoming appt w/cardiology - BNP 02/24/21 was 104.5 - Pharm D reconciled medications with the patient  2. Hypotension - BP looks good although on the low side (110/63) - saw PCP ([place]) 03/21/21; returns tomorrow - BMP 03/01/21 reviewed and showed sodium 144, potassium 3.6, creatinine 1.2 and GFR 58  3. Dysphagia - family reports he cannot keep anything solid down, that he coughs until he eventually coughs it up - consuming mostly pureed and soft foods - report his esophagus has been stretched "as much as it can" in the past by GI - encouraged protein shakes  4: Recent  fall- - fell exiting the elevator on the way to his appt today - door started to shut and he was already part-way through but couldn't get out of the way - fell down and hit his right knee, left shoulder and left temple area - no tenderness to palpation over any of these areas - is on apixaban - already has appt w/PCP tomorrow; called his PCP to tell him about the fall to ask about doing a head CT - advised grandson/ SO to monitor for any mental status changes  Medication list reviewed.   Return in 3 months, sooner if needed.

## 2021-04-04 ENCOUNTER — Encounter: Payer: Self-pay | Admitting: Pharmacist

## 2021-04-04 ENCOUNTER — Encounter: Payer: Self-pay | Admitting: Family

## 2021-04-04 ENCOUNTER — Ambulatory Visit: Payer: PPO | Attending: Family | Admitting: Family

## 2021-04-04 ENCOUNTER — Other Ambulatory Visit: Payer: Self-pay

## 2021-04-04 VITALS — BP 110/63 | HR 66 | Resp 14 | Ht 69.0 in | Wt 219.2 lb

## 2021-04-04 DIAGNOSIS — W2209XA Striking against other stationary object, initial encounter: Secondary | ICD-10-CM | POA: Diagnosis not present

## 2021-04-04 DIAGNOSIS — K219 Gastro-esophageal reflux disease without esophagitis: Secondary | ICD-10-CM | POA: Insufficient documentation

## 2021-04-04 DIAGNOSIS — Z09 Encounter for follow-up examination after completed treatment for conditions other than malignant neoplasm: Secondary | ICD-10-CM | POA: Insufficient documentation

## 2021-04-04 DIAGNOSIS — S0990XA Unspecified injury of head, initial encounter: Secondary | ICD-10-CM | POA: Diagnosis not present

## 2021-04-04 DIAGNOSIS — R0789 Other chest pain: Secondary | ICD-10-CM | POA: Insufficient documentation

## 2021-04-04 DIAGNOSIS — I952 Hypotension due to drugs: Secondary | ICD-10-CM | POA: Diagnosis not present

## 2021-04-04 DIAGNOSIS — Z7901 Long term (current) use of anticoagulants: Secondary | ICD-10-CM | POA: Diagnosis not present

## 2021-04-04 DIAGNOSIS — I739 Peripheral vascular disease, unspecified: Secondary | ICD-10-CM | POA: Insufficient documentation

## 2021-04-04 DIAGNOSIS — J449 Chronic obstructive pulmonary disease, unspecified: Secondary | ICD-10-CM | POA: Insufficient documentation

## 2021-04-04 DIAGNOSIS — E785 Hyperlipidemia, unspecified: Secondary | ICD-10-CM | POA: Diagnosis not present

## 2021-04-04 DIAGNOSIS — Z86711 Personal history of pulmonary embolism: Secondary | ICD-10-CM | POA: Diagnosis not present

## 2021-04-04 DIAGNOSIS — I959 Hypotension, unspecified: Secondary | ICD-10-CM | POA: Diagnosis not present

## 2021-04-04 DIAGNOSIS — M25512 Pain in left shoulder: Secondary | ICD-10-CM | POA: Diagnosis not present

## 2021-04-04 DIAGNOSIS — I110 Hypertensive heart disease with heart failure: Secondary | ICD-10-CM | POA: Insufficient documentation

## 2021-04-04 DIAGNOSIS — Y9289 Other specified places as the place of occurrence of the external cause: Secondary | ICD-10-CM | POA: Diagnosis not present

## 2021-04-04 DIAGNOSIS — I5022 Chronic systolic (congestive) heart failure: Secondary | ICD-10-CM

## 2021-04-04 DIAGNOSIS — I2510 Atherosclerotic heart disease of native coronary artery without angina pectoris: Secondary | ICD-10-CM | POA: Diagnosis not present

## 2021-04-04 DIAGNOSIS — Y9389 Activity, other specified: Secondary | ICD-10-CM | POA: Insufficient documentation

## 2021-04-04 DIAGNOSIS — R0602 Shortness of breath: Secondary | ICD-10-CM | POA: Insufficient documentation

## 2021-04-04 DIAGNOSIS — W19XXXA Unspecified fall, initial encounter: Secondary | ICD-10-CM

## 2021-04-04 DIAGNOSIS — R42 Dizziness and giddiness: Secondary | ICD-10-CM | POA: Diagnosis not present

## 2021-04-04 DIAGNOSIS — W010XXA Fall on same level from slipping, tripping and stumbling without subsequent striking against object, initial encounter: Secondary | ICD-10-CM | POA: Diagnosis not present

## 2021-04-04 DIAGNOSIS — R1310 Dysphagia, unspecified: Secondary | ICD-10-CM | POA: Diagnosis not present

## 2021-04-04 DIAGNOSIS — Z8673 Personal history of transient ischemic attack (TIA), and cerebral infarction without residual deficits: Secondary | ICD-10-CM | POA: Insufficient documentation

## 2021-04-04 MED ORDER — FUROSEMIDE 10 MG/ML IJ SOLN
80.0000 mg | Freq: Once | INTRAMUSCULAR | Status: DC
  Filled 2021-04-04: qty 8

## 2021-04-04 NOTE — Progress Notes (Signed)
Notified Dr. Doy Hutching' office with [facility] that patient reported to [place] hitting the side of his head and shoulder on the elevator on his way in for his appointment in the heart failure clinic today. Patient denies pain or symptoms, vital signs stable, and no bruising or bleeding noted at this time. Informed patient that we would notify his PCP, Dr. Doy Hutching, to see if he wanted to send patient for any imaging or intervention since patient is on eliquis. Patient stated he has an appointment with Dr. Doy Hutching tomorrow. Dr. Doy Hutching' office acknowledged.  ?Georg Ruddle, RN ?

## 2021-04-04 NOTE — Progress Notes (Signed)
[facility] COUNSELING NOTE ? ?*HFpEF* ? ?Guideline-Directed Medical Therapy/Evidence Based Medicine ? ?ACE/ARB/ARNI:  d/c due to hypotension ?Beta Blocker: Metoprolol succinate 50 mg daily - instructed to HOLD if SBP < 100 or DBP <60 ?Aldosterone Antagonist:  none ?Diuretic: Torsemide 40 mg daily ?SGLT2i:  none ? ?Adherence Assessment ? ?Do you ever forget to take your medication? [] Yes ?[x] No  ?Do you ever skip doses due to side effects? [x] Yes ?[] No  ?Do you have trouble affording your medicines? [] Yes ?[x] No  ?Are you ever unable to pick up your medication due to transportation difficulties? [] Yes ?[x] No  ?Do you ever stop taking your medications because you don't believe they are helping? [] Yes ?[x] No  ?Do you check your weight daily? [] Yes ?[x] No  ? ?Adherence strategy: pill box ? ?Barriers to obtaining medications: none ? ?Vital signs: HR 66, BP 110/63, weight (pounds) 219 lb ?ECHO: Date 02/21/21, EF 55-60%, mild left ventricular hypertrophy. Left ventricular diastolic function could not be evaluated. ? ?BMP Latest Ref Rng & Units 02/26/2021 02/24/2021 02/21/2021  ?Glucose 70 - 99 mg/dL 106(H) 115(H) 105(H)  ?BUN 8 - 23 mg/dL 22 25(H) 19  ?Creatinine 0.61 - 1.24 mg/dL 0.95 0.78 0.88  ?Sodium 135 - 145 mmol/L 142 140 135  ?Potassium 3.5 - 5.1 mmol/L 3.4(L) 3.2(L) 3.1(L)  ?Chloride 98 - 111 mmol/L 104 104 99  ?CO2 22 - 32 mmol/L 31 29 30   ?Calcium 8.9 - 10.3 mg/dL 8.7(L) 8.5(L) 8.3(L)  ? ? ?Past Medical History:  ?Diagnosis Date  ? Anxiety   ? Arthritis   ? Asthma   ? CHF (congestive heart failure) ([place])   ? Coronary artery disease   ? GERD (gastroesophageal reflux disease)   ? Glaucoma   ? Glaucoma   ? History of stroke   ? Hyperlipidemia   ? Hyperlipidemia   ? Hypertension   ? Peripheral vascular disease ([place])   ? Sleep apnea   ? Stroke [facility])   ? ? ?ASSESSMENT ?86 year old male who presents to the HF clinic for follow up. PMH includes COPD followed by  pulmonology, CAD, prior stroke, HTN, hyperlipidemia, anxiety, asthma, GERD, PVD, sleep apnea, burst fracture of thoracic vertebra October 2022. ? ?Recent ED Visit (past 6 months): Date - 02/24/21, [facility] - HF & pulmonary embolism. ? ?Patient presents to clinic accompany by family. He suffered a fall while getting out of the elevator today, but no bruises of bleeding noted. NP will assess for any additional intervention needed. Patient denies any pain, swelling, or SOB. Noted recent dx of hypotension on midodrine 2.5mg  BID. Patient is taking midodrine with breakfast and at bedtime. ? ?I re-checked his BP sitting, standing at time 0 ,and standing at 3 minutes. No significant change noted. Patient denies dizziness or episodes of lightheadedness. Report feeling tired on occasion, but no other symptom.  ? ?PLAN ?Recommendations: ?Change midodrine 2.5mg  administration to AM and early PM. Should avoid midodrine 3-4 hr prior to bed to prevent supine hypertension. ?Decrease metoprolol to 12.5-25mg  daily and avoid skipping doses. ? ? ?Time spent: 15 minutes ? ?Teana Lindahl Rodriguez-Guzman PharmD, BCPS ?04/04/2021 3:33 PM ? ? ? ?Current Outpatient Medications:  ?  apixaban (ELIQUIS) 5 MG TABS tablet, Take two tablets twice a day for six days then one tablet twice a day afterwards, Disp: 72 tablet, Rfl: 0 ?  cetirizine (ZYRTEC) 10 MG tablet, Take 10 mg by mouth at bedtime., Disp: , Rfl: 3 ?  Cyanocobalamin (VITAMIN B  12) 500 MCG TABS, Take 500 mcg by mouth daily., Disp: 30 tablet, Rfl: 0 ?  Dorzolamide HCl-Timolol Mal PF 22.3-6.8 MG/ML SOLN, Place 1 drop into the right eye 2 (two) times daily., Disp: , Rfl:  ?  ergocalciferol (VITAMIN D2) 1.25 MG (50000 UT) capsule, Take 50,000 Units by mouth once a week., Disp: , Rfl:  ?  latanoprost (XALATAN) 0.005 % ophthalmic solution, Place 1 drop into both eyes at bedtime., Disp: , Rfl: 0 ?  metoprolol succinate (TOPROL-XL) 50 MG 24 hr tablet, Take 2 tablets (100 mg total) by mouth daily. Home med.  (Patient taking differently: Take 50 mg by mouth daily. HOLD if BP 154 systolic or 60 diatolic), Disp: , Rfl:  ?  midodrine (PROAMATINE) 2.5 MG tablet, Take 1 tablet by mouth 2 (two) times daily., Disp: , Rfl:  ?  potassium chloride SA (KLOR-CON M) 20 MEQ tablet, Take 1 tablet (20 mEq total) by mouth daily., Disp: 30 tablet, Rfl: 0 ?  rosuvastatin (CRESTOR) 10 MG tablet, Take 10 mg by mouth at bedtime., Disp: , Rfl:  ?  senna-docusate (SENOKOT-S) 8.6-50 MG tablet, Take 2 tablets by mouth 2 (two) times daily. While you are on pain medication. (Patient not taking: Reported on 04/04/2021), Disp: , Rfl:  ?  tamsulosin (FLOMAX) 0.4 MG CAPS capsule, Take 2 capsules (0.8 mg total) by mouth daily after supper., Disp: 30 capsule, Rfl:  ?  torsemide (DEMADEX) 20 MG tablet, Take 2 tablets po daily in am, Disp: 60 tablet, Rfl: 0 ?  triamcinolone cream (KENALOG) 0.1 %, Apply 1 application topically 3 (three) times daily. (Apply to rash on legs), Disp: , Rfl:  ? ? ?MEDICATION ADHERENCES TIPS AND STRATEGIES ?Taking medication as prescribed improves patient outcomes in heart failure (reduces hospitalizations, improves symptoms, increases survival) ?Side effects of medications can be managed by decreasing doses, switching agents, stopping drugs, or adding additional therapy. Please let someone in the [facility] know if you have having bothersome side effects so we can modify your regimen. Do not alter your medication regimen without talking to [place].  ?Medication reminders can help patients remember to take drugs on time. If you are missing or forgetting doses you can try linking behaviors, using pill boxes, or an electronic reminder like an alarm on your phone or an app. Some people can also get automated phone calls as medication reminders.  ? ?

## 2021-04-04 NOTE — Patient Instructions (Signed)
Continue weighing daily and call for an overnight weight gain of 3 pounds or more or a weekly weight gain of more than 5 pounds. ? ? ?Call Dr. Doy Hutching and let him know of your recent fall today.  ?

## 2021-04-05 DIAGNOSIS — I5032 Chronic diastolic (congestive) heart failure: Secondary | ICD-10-CM | POA: Diagnosis not present

## 2021-04-05 DIAGNOSIS — I10 Essential (primary) hypertension: Secondary | ICD-10-CM | POA: Diagnosis not present

## 2021-04-06 DIAGNOSIS — R296 Repeated falls: Secondary | ICD-10-CM | POA: Diagnosis not present

## 2021-04-06 DIAGNOSIS — J449 Chronic obstructive pulmonary disease, unspecified: Secondary | ICD-10-CM | POA: Diagnosis not present

## 2021-04-06 DIAGNOSIS — I110 Hypertensive heart disease with heart failure: Secondary | ICD-10-CM | POA: Diagnosis not present

## 2021-04-06 DIAGNOSIS — E785 Hyperlipidemia, unspecified: Secondary | ICD-10-CM | POA: Diagnosis not present

## 2021-04-06 DIAGNOSIS — I739 Peripheral vascular disease, unspecified: Secondary | ICD-10-CM | POA: Diagnosis not present

## 2021-04-06 DIAGNOSIS — R338 Other retention of urine: Secondary | ICD-10-CM | POA: Diagnosis not present

## 2021-04-06 DIAGNOSIS — I509 Heart failure, unspecified: Secondary | ICD-10-CM | POA: Diagnosis not present

## 2021-04-06 DIAGNOSIS — I2510 Atherosclerotic heart disease of native coronary artery without angina pectoris: Secondary | ICD-10-CM | POA: Diagnosis not present

## 2021-04-06 DIAGNOSIS — K2100 Gastro-esophageal reflux disease with esophagitis, without bleeding: Secondary | ICD-10-CM | POA: Diagnosis not present

## 2021-04-06 DIAGNOSIS — S22082D Unstable burst fracture of T11-T12 vertebra, subsequent encounter for fracture with routine healing: Secondary | ICD-10-CM | POA: Diagnosis not present

## 2021-04-06 DIAGNOSIS — D696 Thrombocytopenia, unspecified: Secondary | ICD-10-CM | POA: Diagnosis not present

## 2021-04-06 DIAGNOSIS — R351 Nocturia: Secondary | ICD-10-CM | POA: Diagnosis not present

## 2021-04-06 DIAGNOSIS — E538 Deficiency of other specified B group vitamins: Secondary | ICD-10-CM | POA: Diagnosis not present

## 2021-04-06 DIAGNOSIS — M5440 Lumbago with sciatica, unspecified side: Secondary | ICD-10-CM | POA: Diagnosis not present

## 2021-04-06 DIAGNOSIS — I2693 Single subsegmental pulmonary embolism without acute cor pulmonale: Secondary | ICD-10-CM | POA: Diagnosis not present

## 2021-04-06 DIAGNOSIS — E559 Vitamin D deficiency, unspecified: Secondary | ICD-10-CM | POA: Diagnosis not present

## 2021-04-06 DIAGNOSIS — R350 Frequency of micturition: Secondary | ICD-10-CM | POA: Diagnosis not present

## 2021-04-06 DIAGNOSIS — S32019D Unspecified fracture of first lumbar vertebra, subsequent encounter for fracture with routine healing: Secondary | ICD-10-CM | POA: Diagnosis not present

## 2021-04-06 DIAGNOSIS — M1990 Unspecified osteoarthritis, unspecified site: Secondary | ICD-10-CM | POA: Diagnosis not present

## 2021-04-06 DIAGNOSIS — N401 Enlarged prostate with lower urinary tract symptoms: Secondary | ICD-10-CM | POA: Diagnosis not present

## 2021-04-06 DIAGNOSIS — G454 Transient global amnesia: Secondary | ICD-10-CM | POA: Diagnosis not present

## 2021-04-06 DIAGNOSIS — H409 Unspecified glaucoma: Secondary | ICD-10-CM | POA: Diagnosis not present

## 2021-04-06 DIAGNOSIS — G4733 Obstructive sleep apnea (adult) (pediatric): Secondary | ICD-10-CM | POA: Diagnosis not present

## 2021-04-06 DIAGNOSIS — F419 Anxiety disorder, unspecified: Secondary | ICD-10-CM | POA: Diagnosis not present

## 2021-04-09 DIAGNOSIS — E782 Mixed hyperlipidemia: Secondary | ICD-10-CM | POA: Diagnosis not present

## 2021-04-09 DIAGNOSIS — I2699 Other pulmonary embolism without acute cor pulmonale: Secondary | ICD-10-CM | POA: Diagnosis not present

## 2021-04-09 DIAGNOSIS — I10 Essential (primary) hypertension: Secondary | ICD-10-CM | POA: Diagnosis not present

## 2021-04-09 DIAGNOSIS — I5032 Chronic diastolic (congestive) heart failure: Secondary | ICD-10-CM | POA: Diagnosis not present

## 2021-04-09 DIAGNOSIS — J449 Chronic obstructive pulmonary disease, unspecified: Secondary | ICD-10-CM | POA: Diagnosis not present

## 2021-04-13 DIAGNOSIS — Z8781 Personal history of (healed) traumatic fracture: Secondary | ICD-10-CM | POA: Diagnosis not present

## 2021-04-13 DIAGNOSIS — R443 Hallucinations, unspecified: Secondary | ICD-10-CM | POA: Diagnosis not present

## 2021-04-13 DIAGNOSIS — Z8673 Personal history of transient ischemic attack (TIA), and cerebral infarction without residual deficits: Secondary | ICD-10-CM | POA: Diagnosis not present

## 2021-04-13 DIAGNOSIS — Z87898 Personal history of other specified conditions: Secondary | ICD-10-CM | POA: Diagnosis not present

## 2021-04-17 DIAGNOSIS — I517 Cardiomegaly: Secondary | ICD-10-CM | POA: Diagnosis not present

## 2021-04-17 DIAGNOSIS — R059 Cough, unspecified: Secondary | ICD-10-CM | POA: Diagnosis not present

## 2021-04-17 DIAGNOSIS — I2609 Other pulmonary embolism with acute cor pulmonale: Secondary | ICD-10-CM | POA: Diagnosis not present

## 2021-04-17 DIAGNOSIS — Z79899 Other long term (current) drug therapy: Secondary | ICD-10-CM | POA: Diagnosis not present

## 2021-04-17 DIAGNOSIS — I509 Heart failure, unspecified: Secondary | ICD-10-CM | POA: Diagnosis not present

## 2021-04-17 DIAGNOSIS — I5032 Chronic diastolic (congestive) heart failure: Secondary | ICD-10-CM | POA: Diagnosis not present

## 2021-04-17 DIAGNOSIS — D696 Thrombocytopenia, unspecified: Secondary | ICD-10-CM | POA: Diagnosis not present

## 2021-05-04 DIAGNOSIS — I5032 Chronic diastolic (congestive) heart failure: Secondary | ICD-10-CM | POA: Diagnosis not present

## 2021-05-15 ENCOUNTER — Other Ambulatory Visit: Payer: Self-pay | Admitting: Internal Medicine

## 2021-05-15 DIAGNOSIS — R059 Cough, unspecified: Secondary | ICD-10-CM

## 2021-05-25 ENCOUNTER — Ambulatory Visit
Admission: RE | Admit: 2021-05-25 | Discharge: 2021-05-25 | Disposition: A | Discharge status: Home / Self Care | Payer: PPO | Source: Ambulatory Visit | Attending: Internal Medicine | Admitting: Internal Medicine

## 2021-05-25 DIAGNOSIS — I2510 Atherosclerotic heart disease of native coronary artery without angina pectoris: Secondary | ICD-10-CM | POA: Insufficient documentation

## 2021-05-25 DIAGNOSIS — R059 Cough, unspecified: Secondary | ICD-10-CM | POA: Diagnosis not present

## 2021-05-25 DIAGNOSIS — I3139 Other pericardial effusion (noninflammatory): Secondary | ICD-10-CM | POA: Insufficient documentation

## 2021-05-25 DIAGNOSIS — J984 Other disorders of lung: Secondary | ICD-10-CM | POA: Diagnosis not present

## 2021-05-25 DIAGNOSIS — I700 Atherosclerosis of aorta: Secondary | ICD-10-CM | POA: Insufficient documentation

## 2021-05-29 DIAGNOSIS — Z981 Arthrodesis status: Secondary | ICD-10-CM | POA: Diagnosis not present

## 2021-05-29 DIAGNOSIS — M4324 Fusion of spine, thoracic region: Secondary | ICD-10-CM | POA: Diagnosis not present

## 2021-05-29 DIAGNOSIS — M4325 Fusion of spine, thoracolumbar region: Secondary | ICD-10-CM | POA: Diagnosis not present

## 2021-05-29 DIAGNOSIS — Z9049 Acquired absence of other specified parts of digestive tract: Secondary | ICD-10-CM | POA: Diagnosis not present

## 2021-06-01 DIAGNOSIS — J449 Chronic obstructive pulmonary disease, unspecified: Secondary | ICD-10-CM | POA: Diagnosis not present

## 2021-06-01 DIAGNOSIS — I5032 Chronic diastolic (congestive) heart failure: Secondary | ICD-10-CM | POA: Diagnosis not present

## 2021-06-01 DIAGNOSIS — I10 Essential (primary) hypertension: Secondary | ICD-10-CM | POA: Diagnosis not present

## 2021-06-01 DIAGNOSIS — R059 Cough, unspecified: Secondary | ICD-10-CM | POA: Diagnosis not present

## 2021-06-04 DIAGNOSIS — R053 Chronic cough: Secondary | ICD-10-CM | POA: Diagnosis not present

## 2021-06-04 DIAGNOSIS — R600 Localized edema: Secondary | ICD-10-CM | POA: Diagnosis not present

## 2021-06-04 DIAGNOSIS — J849 Interstitial pulmonary disease, unspecified: Secondary | ICD-10-CM | POA: Diagnosis not present

## 2021-06-04 DIAGNOSIS — R0609 Other forms of dyspnea: Secondary | ICD-10-CM | POA: Diagnosis not present

## 2021-06-04 DIAGNOSIS — R918 Other nonspecific abnormal finding of lung field: Secondary | ICD-10-CM | POA: Diagnosis not present

## 2021-06-04 DIAGNOSIS — J439 Emphysema, unspecified: Secondary | ICD-10-CM | POA: Diagnosis not present

## 2021-06-28 ENCOUNTER — Other Ambulatory Visit: Payer: Self-pay | Admitting: *Deleted

## 2021-06-28 DIAGNOSIS — N200 Calculus of kidney: Secondary | ICD-10-CM

## 2021-06-29 ENCOUNTER — Ambulatory Visit: Payer: PPO | Admitting: Urology

## 2021-06-29 ENCOUNTER — Ambulatory Visit
Admission: RE | Admit: 2021-06-29 | Discharge: 2021-06-29 | Disposition: A | Discharge status: Home / Self Care | Payer: PPO | Source: Ambulatory Visit | Attending: Urology | Admitting: Urology

## 2021-06-29 ENCOUNTER — Encounter: Payer: Self-pay | Admitting: Urology

## 2021-06-29 VITALS — BP 133/69 | HR 67 | Ht 68.0 in | Wt 235.0 lb

## 2021-06-29 DIAGNOSIS — N200 Calculus of kidney: Secondary | ICD-10-CM | POA: Insufficient documentation

## 2021-06-29 DIAGNOSIS — R82991 Hypocitraturia: Secondary | ICD-10-CM | POA: Diagnosis not present

## 2021-06-29 DIAGNOSIS — Z87442 Personal history of urinary calculi: Secondary | ICD-10-CM | POA: Diagnosis not present

## 2021-06-29 DIAGNOSIS — M160 Bilateral primary osteoarthritis of hip: Secondary | ICD-10-CM | POA: Diagnosis not present

## 2021-06-29 DIAGNOSIS — N401 Enlarged prostate with lower urinary tract symptoms: Secondary | ICD-10-CM | POA: Diagnosis not present

## 2021-06-29 DIAGNOSIS — Z981 Arthrodesis status: Secondary | ICD-10-CM | POA: Diagnosis not present

## 2021-06-29 NOTE — Progress Notes (Signed)
06/29/2021 11:44 AM   Chris Kent Chris Kent [birth year] [id number]  Referring provider: Idelle Crouch, MD [facility] [place],  [place] [address]  Chief Complaint  Patient presents with   Nephrolithiasis     Urologic history: 1.  Recurrent nephrolithiasis -CT 10/2020 small right lower pole calculus -Metabolic evaluation low urine volume, hypocitraturia  2.  BPH with LUTS -Tamsulosin  HPI: 86 y.o. male presents for annual follow-up.  No complaints today Remains on tamsulosin Currently on a diuretic and does note increased frequency Denies dysuria, gross hematuria Denies flank, abdominal or pelvic pain   PMH: Past Medical History:  Diagnosis Date   Anxiety    Arthritis    Asthma    CHF (congestive heart failure) (HCC)    Coronary artery disease    GERD (gastroesophageal reflux disease)    Glaucoma    Glaucoma    History of stroke    Hyperlipidemia    Hyperlipidemia    Hypertension    Peripheral vascular disease ([place])    Sleep apnea    Stroke [facility])     Surgical History: Past Surgical History:  Procedure Laterality Date   REPLACEMENT TOTAL KNEE BILATERAL      Home Medications:  Allergies as of 06/29/2021   No Known Allergies      Medication List        Accurate as of Jun 29, 2021 11:44 AM. If you have any questions, ask your nurse or doctor.          apixaban 5 MG Tabs tablet Commonly known as: ELIQUIS Take two tablets twice a day for six days then one tablet twice a day afterwards   cetirizine 10 MG tablet Commonly known as: ZYRTEC Take 10 mg by mouth at bedtime.   dorzolamidel-timolol 22.3-6.8 MG/ML Soln ophthalmic solution Commonly known as: COSOPT Place 1 drop into the right eye 2 (two) times daily.   ergocalciferol 1.25 MG (50000 UT) capsule Commonly known as: VITAMIN D2 Take 50,000 Units by mouth once a week.   fludrocortisone 0.1 MG tablet Commonly known as: FLORINEF Take 100 mcg by mouth daily.    latanoprost 0.005 % ophthalmic solution Commonly known as: XALATAN Place 1 drop into both eyes at bedtime.   metoprolol succinate 25 MG 24 hr tablet Commonly known as: TOPROL-XL Take 12.5 mg by mouth daily. What changed: Another medication with the same name was removed. Continue taking this medication, and follow the directions you see here.   midodrine 10 MG tablet Commonly known as: PROAMATINE Take 10 mg by mouth 3 (three) times daily. What changed: Another medication with the same name was removed. Continue taking this medication, and follow the directions you see here.   potassium chloride SA 20 MEQ tablet Commonly known as: KLOR-CON M Take 1 tablet (20 mEq total) by mouth daily.   predniSONE 5 MG tablet Commonly known as: DELTASONE Take 5 mg by mouth daily.   rosuvastatin 10 MG tablet Commonly known as: CRESTOR Take 10 mg by mouth at bedtime.   senna-docusate 8.6-50 MG tablet Commonly known as: Senokot-S Take 2 tablets by mouth 2 (two) times daily. While you are on pain medication.   tamsulosin 0.4 MG Caps capsule Commonly known as: FLOMAX Take 2 capsules (0.8 mg total) by mouth daily after supper.   torsemide 20 MG tablet Commonly known as: DEMADEX Take 2 tablets po daily in am   triamcinolone cream 0.1 % Commonly known as: KENALOG Apply 1 application topically  3 (three) times daily. (Apply to rash on legs)   Vitamin B 12 500 MCG Tabs Take 500 mcg by mouth daily.        Allergies: No Known Allergies  Family History: Family History  Problem Relation Age of Onset   Prostate cancer Father     Social History:  reports that he has never smoked. He has never used smokeless tobacco. He reports that he does not drink alcohol and does not use drugs.   Physical Exam: BP 133/69   Pulse 67   Ht '5\' 8"'$  (1.727 m)   Wt 235 lb (106.6 kg)   BMI 35.73 kg/m   Constitutional:  Alert and oriented, No acute distress. HEENT: [place] AT, moist mucus membranes.  Trachea  midline Respiratory: Normal respiratory effort, no increased work of breathing. Psychiatric: Normal mood and affect.  Imaging: KUB performed today was personally reviewed and interpreted.  No calcifications suspicious for urinary tract stones are identified  Assessment & Plan:    1.  Nephrolithiasis Asymptomatic Calculus not visualized on KUB Continue annual follow-up and prn visit for flank pain  2.  BPH with LUTS Stable Continue tamsulosin   Abbie Sons, [place] [address], [place],  [address] [phone]

## 2021-07-03 DIAGNOSIS — E538 Deficiency of other specified B group vitamins: Secondary | ICD-10-CM | POA: Diagnosis not present

## 2021-07-03 DIAGNOSIS — I5032 Chronic diastolic (congestive) heart failure: Secondary | ICD-10-CM | POA: Diagnosis not present

## 2021-07-03 DIAGNOSIS — E782 Mixed hyperlipidemia: Secondary | ICD-10-CM | POA: Diagnosis not present

## 2021-07-03 DIAGNOSIS — G454 Transient global amnesia: Secondary | ICD-10-CM | POA: Diagnosis not present

## 2021-07-03 DIAGNOSIS — I10 Essential (primary) hypertension: Secondary | ICD-10-CM | POA: Diagnosis not present

## 2021-07-03 DIAGNOSIS — R739 Hyperglycemia, unspecified: Secondary | ICD-10-CM | POA: Diagnosis not present

## 2021-07-03 DIAGNOSIS — J449 Chronic obstructive pulmonary disease, unspecified: Secondary | ICD-10-CM | POA: Diagnosis not present

## 2021-07-03 DIAGNOSIS — I2510 Atherosclerotic heart disease of native coronary artery without angina pectoris: Secondary | ICD-10-CM | POA: Diagnosis not present

## 2021-07-04 NOTE — Progress Notes (Unsigned)
Patient ID: Chris Kent, male    DOB: 1935/09/30, 86 y.o.   MRN: [id number]  HPI  Chris Kent is a 86 y.o. male with medical history significant for COPD followed by pulmonology, CAD, prior stroke, HTN, hyperlipidemia, anxiety, asthma, GERD, PVD, sleep apnea, burst fracture of thoracic vertebra October 2022.  Echo 02/21/21 - Left Ventricle: Left ventricular ejection fraction, by estimation, is 55 to 60%. The left ventricle has normal function. The left ventricle has no regional wall motion abnormalities. The left ventricular internal cavity size was normal in size. There is mild left ventricular hypertrophy. Left ventricular diastolic function could not be evaluated.   Admitted 02/24/21 due to acute on chronic heart failure along with pulmonary embolism. Was placed on IV heparin then transitioned to Eliquis. Required IV lasix twice. Metabolic encephalopathy, felt to have some underlying cognitive decline. Discharged home with [facility] after 3 days. Was in the ED 02/20/21 due to pedal edema where he was treated and released.   He presents today for a follow-up visit with a chief complaint of moderate fatigue upon minimal exertion. Describes this as chronic in nature. He has associated cough, shortness of breath, light-headedness and gradual weight gain along with this. He denies any difficulty sleeping, abdominal distention, palpitations, pedal edema or chest pain.   His biggest issue is of this frequent cough. He says that it's worse when he gets up in the mornings and he sometimes coughs so hard that he will vomit. PCP is aware and family is trying to get the cough medicine approved from his insurance.   Past Medical History:  Diagnosis Date   Anxiety    Arthritis    Asthma    CHF (congestive heart failure) (HCC)    Coronary artery disease    GERD (gastroesophageal reflux disease)    Glaucoma    Glaucoma    History of stroke    Hyperlipidemia    Hyperlipidemia    Hypertension     Peripheral vascular disease ([place])    Sleep apnea    Stroke [facility])    Past Surgical History:  Procedure Laterality Date   REPLACEMENT TOTAL KNEE BILATERAL     Family History  Problem Relation Age of Onset   Prostate cancer Father    Social History   Tobacco Use   Smoking status: Never   Smokeless tobacco: Never  Substance Use Topics   Alcohol use: No   No Known Allergies  Prior to Admission medications   Medication Sig Start Date End Date Taking? Authorizing Provider  apixaban (ELIQUIS) 5 MG TABS tablet Take two tablets twice a day for six days then one tablet twice a day afterwards 02/26/21  Yes Wieting, Richard, MD  cetirizine (ZYRTEC) 10 MG tablet Take 10 mg by mouth at bedtime.   Yes [provider]  Cyanocobalamin (VITAMIN B 12) 500 MCG TABS Take 500 mcg by mouth daily. 02/26/21  Yes Wieting, Richard, MD  Dorzolamide HCl-Timolol Mal PF 22.3-6.8 MG/ML SOLN Place 1 drop into the right eye 2 (two) times daily.   Yes [provider]  fludrocortisone (FLORINEF) 0.1 MG tablet Take 100 mcg by mouth daily. 06/27/21  Yes [provider]  latanoprost (XALATAN) 0.005 % ophthalmic solution Place 1 drop into both eyes at bedtime.   Yes [provider]  metoprolol succinate (TOPROL-XL) 25 MG 24 hr tablet Take 12.5 mg by mouth daily. 06/03/21  Yes [provider]  midodrine (PROAMATINE) 10 MG tablet Take 10 mg  by mouth 3 (three) times daily. 06/13/21  Yes [provider]  predniSONE (DELTASONE) 5 MG tablet Take 5 mg by mouth daily. 06/04/21  Yes [provider]  rosuvastatin (CRESTOR) 10 MG tablet Take 10 mg by mouth at bedtime.   Yes [provider]  tamsulosin (FLOMAX) 0.4 MG CAPS capsule Take 2 capsules (0.8 mg total) by mouth daily after supper. 10/31/20  Yes Bonnielee Haff, MD  torsemide [facility]) 20 MG tablet Take 2 tablets po daily in am 02/27/21  Yes Wieting, Richard, MD  triamcinolone cream (KENALOG) 0.1 % Apply 1  application topically 3 (three) times daily. (Apply to rash on legs)   Yes [provider]  donepezil (ARICEPT) 5 MG tablet Take 5 mg by mouth daily. 07/03/21   [provider]  ergocalciferol (VITAMIN D2) 1.25 MG (50000 UT) capsule Take 1 capsule (50,000 Units total) by mouth once a week. 07/05/21   Alisa Graff, FNP  potassium chloride SA (KLOR-CON M) 20 MEQ tablet Take 1 tablet (20 mEq total) by mouth daily. 07/05/21   Alisa Graff, FNP  senna-docusate (SENOKOT-S) 8.6-50 MG tablet Take 2 tablets by mouth 2 (two) times daily. While you are on pain medication. Patient not taking: Reported on 07/05/2021 11/30/20   Enzo Bi, MD    Review of Systems  Constitutional:  Positive for fatigue. Negative for appetite change.  HENT:  Negative for congestion, postnasal drip and sore throat.   Eyes: Negative.   Respiratory:  Positive for cough and shortness of breath.   Cardiovascular:  Negative for chest pain, palpitations and leg swelling.  Gastrointestinal:  Negative for abdominal distention and abdominal pain.  Endocrine: Negative.   Genitourinary: Negative.   Musculoskeletal:  Negative for back pain and neck pain.  Skin: Negative.   Allergic/Immunologic: Negative.   Neurological:  Positive for light-headedness and headaches.  Hematological:  Negative for adenopathy. Does not bruise/bleed easily.  Psychiatric/Behavioral:  Negative for dysphoric mood and sleep disturbance. The patient is not nervous/anxious.    Vitals:   07/05/21 0953  BP: 130/66  Pulse: 78  Resp: 18  SpO2: 97%  Weight: 227 lb (103 kg)  Height: '5\' 9"'$  (1.753 m)   Wt Readings from Last 3 Encounters:  07/05/21 227 lb (103 kg)  06/29/21 235 lb (106.6 kg)  04/04/21 219 lb 4 oz (99.5 kg)   Lab Results  Component Value Date   CREATININE 0.95 07/05/2021   CREATININE 0.95 02/26/2021   CREATININE 0.78 02/24/2021   Physical Exam Vitals and nursing note reviewed. Exam conducted with a chaperone present  (grandson and SO).  Constitutional:      Appearance: Normal appearance.  HENT:     Head: Normocephalic and atraumatic.  Cardiovascular:     Rate and Rhythm: Normal rate and regular rhythm.  Pulmonary:     Effort: Pulmonary effort is normal. No respiratory distress.     Breath sounds: Rhonchi (bilateral upper lobes) present. No wheezing or rales.  Abdominal:     General: There is no distension.     Palpations: Abdomen is soft.  Musculoskeletal:        General: No tenderness.     Cervical back: Normal range of motion and neck supple.     Right lower leg: Edema (trace pitting) present.     Left lower leg: Edema (trace pitting) present.  Skin:    General: Skin is warm and dry.  Neurological:     General: No focal deficit present.  Mental Status: He is alert and oriented to person, place, and time.  Psychiatric:        Mood and Affect: Mood normal.        Behavior: Behavior normal.        Thought Content: Thought content normal.   Assessment & Plan:  Heart failure with preserved ejection fraction with structural changes- - NYHA III - euvolemic today - not weighing daily but does have working scales; encouraged to resume daily weights so that he can call for an overnight weight gain of > 2 pounds or a weekly weight gain of > 5 pounds - weight up 8 pounds from last visit here 3 months ago - on GDMT of metoprolol - saw cardiology (Par[place]) 04/09/21 - BNP 02/24/21 was 104.5 - Pharm D reconciled medications with the patient; she has messaged cardiology/neurology about possibly switching his florinef to something less likely to contribute to fluid retention  2. Hypotension - BP looks good today (130/66); has been low in the past; currently on midodrine - saw PCP ([place]) 07/03/21 - BMP 04/17/21 reviewed and showed sodium 147, potassium 3.6, creatinine 1.1 and GFR 64 - recheck BMP today  3. Dysphagia- - continues to have a chronic cough - question whether he could be aspirating  since he coughs so much if he eats, drinks or not  4: COPD- - saw pulmonology Raul Del) 06/04/21 - on chronic prednisone   Medication list reviewed.   Return in 2 months, sooner if needed.

## 2021-07-05 ENCOUNTER — Ambulatory Visit (HOSPITAL_BASED_OUTPATIENT_CLINIC_OR_DEPARTMENT_OTHER): Payer: PPO | Admitting: Family

## 2021-07-05 ENCOUNTER — Encounter: Payer: Self-pay | Admitting: Pharmacist

## 2021-07-05 ENCOUNTER — Encounter: Payer: Self-pay | Admitting: Family

## 2021-07-05 ENCOUNTER — Other Ambulatory Visit
Admission: RE | Admit: 2021-07-05 | Discharge: 2021-07-05 | Disposition: A | Discharge status: Home / Self Care | Payer: PPO | Source: Ambulatory Visit | Attending: Family | Admitting: Family

## 2021-07-05 ENCOUNTER — Telehealth: Payer: Self-pay | Admitting: Family

## 2021-07-05 VITALS — BP 130/66 | HR 78 | Resp 18 | Ht 69.0 in | Wt 227.0 lb

## 2021-07-05 DIAGNOSIS — J449 Chronic obstructive pulmonary disease, unspecified: Secondary | ICD-10-CM | POA: Insufficient documentation

## 2021-07-05 DIAGNOSIS — F419 Anxiety disorder, unspecified: Secondary | ICD-10-CM | POA: Insufficient documentation

## 2021-07-05 DIAGNOSIS — Z79899 Other long term (current) drug therapy: Secondary | ICD-10-CM | POA: Insufficient documentation

## 2021-07-05 DIAGNOSIS — Z8673 Personal history of transient ischemic attack (TIA), and cerebral infarction without residual deficits: Secondary | ICD-10-CM | POA: Insufficient documentation

## 2021-07-05 DIAGNOSIS — K219 Gastro-esophageal reflux disease without esophagitis: Secondary | ICD-10-CM | POA: Insufficient documentation

## 2021-07-05 DIAGNOSIS — J45909 Unspecified asthma, uncomplicated: Secondary | ICD-10-CM | POA: Insufficient documentation

## 2021-07-05 DIAGNOSIS — I2510 Atherosclerotic heart disease of native coronary artery without angina pectoris: Secondary | ICD-10-CM | POA: Insufficient documentation

## 2021-07-05 DIAGNOSIS — R1310 Dysphagia, unspecified: Secondary | ICD-10-CM

## 2021-07-05 DIAGNOSIS — R5383 Other fatigue: Secondary | ICD-10-CM | POA: Insufficient documentation

## 2021-07-05 DIAGNOSIS — I5032 Chronic diastolic (congestive) heart failure: Secondary | ICD-10-CM | POA: Insufficient documentation

## 2021-07-05 DIAGNOSIS — I110 Hypertensive heart disease with heart failure: Secondary | ICD-10-CM | POA: Insufficient documentation

## 2021-07-05 DIAGNOSIS — I952 Hypotension due to drugs: Secondary | ICD-10-CM | POA: Diagnosis not present

## 2021-07-05 DIAGNOSIS — I959 Hypotension, unspecified: Secondary | ICD-10-CM | POA: Insufficient documentation

## 2021-07-05 DIAGNOSIS — R053 Chronic cough: Secondary | ICD-10-CM | POA: Insufficient documentation

## 2021-07-05 DIAGNOSIS — I739 Peripheral vascular disease, unspecified: Secondary | ICD-10-CM | POA: Insufficient documentation

## 2021-07-05 DIAGNOSIS — I5022 Chronic systolic (congestive) heart failure: Secondary | ICD-10-CM | POA: Insufficient documentation

## 2021-07-05 DIAGNOSIS — Z7952 Long term (current) use of systemic steroids: Secondary | ICD-10-CM | POA: Insufficient documentation

## 2021-07-05 DIAGNOSIS — E785 Hyperlipidemia, unspecified: Secondary | ICD-10-CM | POA: Insufficient documentation

## 2021-07-05 DIAGNOSIS — G4730 Sleep apnea, unspecified: Secondary | ICD-10-CM | POA: Insufficient documentation

## 2021-07-05 LAB — BASIC METABOLIC PANEL
Anion gap: 9 (ref 5–15)
BUN: 16 mg/dL (ref 8–23)
CO2: 35 mmol/L — ABNORMAL HIGH (ref 22–32)
Calcium: 8.4 mg/dL — ABNORMAL LOW (ref 8.9–10.3)
Chloride: 103 mmol/L (ref 98–111)
Creatinine, Ser: 0.95 mg/dL (ref 0.61–1.24)
GFR, Estimated: >60 mL/min (ref >60)
Glucose, Bld: 144 mg/dL — ABNORMAL HIGH (ref 70–99)
Potassium: 2.9 mmol/L — ABNORMAL LOW (ref 3.5–5.1)
Sodium: 147 mmol/L — ABNORMAL HIGH (ref 135–145)

## 2021-07-05 MED ORDER — ERGOCALCIFEROL 1.25 MG (50000 UT) PO CAPS: 50000.0000 [IU] | ORAL_CAPSULE | ORAL | 5 refills | Status: DC

## 2021-07-05 MED ORDER — POTASSIUM CHLORIDE CRYS ER 20 MEQ PO TBCR
40.0000 meq | EXTENDED_RELEASE_TABLET | Freq: Every day | ORAL | 0 refills | Status: DC
Start: 2021-07-05 — End: 2022-02-24

## 2021-07-05 NOTE — Telephone Encounter (Signed)
Spoke with patient regarding BMP results obtained earlier today. Renal function looks good although potassium is low at 2.9. He's currently taking potassium 27mq daily.   Advised him to start taking 474m daily. Will ask cardiology to recheck labs at his visit next week.   He verbalized understanding.

## 2021-07-05 NOTE — Progress Notes (Signed)
[name] - PHARMACIST COUNSELING NOTE  *HFpEf*  Guideline-Directed Medical Therapy/Evidence Based Medicine  ACE/ARB/ARNI: N/A Beta Blocker: Metoprolol succinate 12.5 mg daily Aldosterone Antagonist:  none Diuretic: Torsemide 40 mg daily SGLT2i:  none  Adherence Assessment  Do you ever forget to take your medication? '[]'$ Yes '[x]'$ No  Do you ever skip doses due to side effects? '[]'$ Yes '[x]'$ No  Do you have trouble affording your medicines? '[]'$ Yes '[x]'$ No  Are you ever unable to pick up your medication due to transportation difficulties? '[]'$ Yes '[x]'$ No  Do you ever stop taking your medications because you don't believe they are helping? '[]'$ Yes '[x]'$ No  Do you check your weight daily? '[x]'$ Yes '[]'$ No   Adherence strategy: pill box   Barriers to obtaining medications: none   Vital signs: HR 66, BP 110/63, weight (pounds) 219 lb ECHO: Date 02/21/21, EF 55-60%, mild left ventricular hypertrophy. Left ventricular diastolic function could not be evaluated.     Latest Ref Rng & Units 02/26/2021    5:26 AM 02/24/2021    1:48 PM 02/21/2021    5:42 AM  BMP  Glucose 70 - 99 mg/dL 106   115   105    BUN 8 - 23 mg/dL '22   25   19    '$ Creatinine 0.61 - 1.24 mg/dL 0.95   0.78   0.88    Sodium 135 - 145 mmol/L 142   140   135    Potassium 3.5 - 5.1 mmol/L 3.4   3.2   3.1    Chloride 98 - 111 mmol/L 104   104   99    CO2 22 - 32 mmol/L '31   29   30    '$ Calcium 8.9 - 10.3 mg/dL 8.7   8.5   8.3      Past Medical History:  Diagnosis Date   Anxiety    Arthritis    Asthma    CHF (congestive heart failure) (HCC)    Coronary artery disease    GERD (gastroesophageal reflux disease)    Glaucoma    Glaucoma    History of stroke    Hyperlipidemia    Hyperlipidemia    Hypertension    Peripheral vascular disease ([place])    Sleep apnea    Stroke [facility])     ASSESSMENT 87 year old male who presents to the HF clinic for follow up. PMH includes COPD followed by  pulmonology, CAD, prior stroke, HTN, hyperlipidemia, anxiety, asthma, GERD, PVD, sleep apnea, burst fracture of thoracic vertebra October 2022. Last ED visit was 02/24/21 for HF & pulmonary embolism.   Patient presents to clinic accompany by family. PCP still working on medication management for hypotension. Only new symptom reported today is cough.  Patient denies dizziness, lightheadedness or falls.   Noted patient is currently on midodrine, prednisone, and fludrocortisone. Fludrocortisone will worsen fluid rentention and not appropriate to use on HF.  PLAN  Continue current torsemide, KCl, and metoprolol Repeat BMP Message sent to PCP (Dr Doy Hutching to consider stop fludrocortisone and start Northera for hypotension management).   Time spent: 15 minutes  Chris Kent PharmD, BCPS 07/05/2021 12:29 PM   Current Outpatient Medications:    apixaban (ELIQUIS) 5 MG TABS tablet, Take two tablets twice a day for six days then one tablet twice a day afterwards, Disp: 72 tablet, Rfl: 0   cetirizine (ZYRTEC) 10 MG tablet, Take 10 mg by mouth at bedtime., Disp: , Rfl: 3   Cyanocobalamin (VITAMIN  B 12) 500 MCG TABS, Take 500 mcg by mouth daily., Disp: 30 tablet, Rfl: 0   donepezil (ARICEPT) 5 MG tablet, Take 5 mg by mouth daily., Disp: , Rfl:    Dorzolamide HCl-Timolol Mal PF 22.3-6.8 MG/ML SOLN, Place 1 drop into the right eye 2 (two) times daily., Disp: , Rfl:    ergocalciferol (VITAMIN D2) 1.25 MG (50000 UT) capsule, Take 1 capsule (50,000 Units total) by mouth once a week., Disp: 4 capsule, Rfl: 5   fludrocortisone (FLORINEF) 0.1 MG tablet, Take 100 mcg by mouth daily., Disp: , Rfl:    latanoprost (XALATAN) 0.005 % ophthalmic solution, Place 1 drop into both eyes at bedtime., Disp: , Rfl: 0   metoprolol succinate (TOPROL-XL) 25 MG 24 hr tablet, Take 12.5 mg by mouth daily., Disp: , Rfl:    midodrine (PROAMATINE) 10 MG tablet, Take 10 mg by mouth 3 (three) times daily., Disp: , Rfl:     potassium chloride SA (KLOR-CON M) 20 MEQ tablet, Take 1 tablet (20 mEq total) by mouth daily., Disp: 30 tablet, Rfl: 0   predniSONE (DELTASONE) 5 MG tablet, Take 5 mg by mouth daily., Disp: , Rfl:    rosuvastatin (CRESTOR) 10 MG tablet, Take 10 mg by mouth at bedtime., Disp: , Rfl:    senna-docusate (SENOKOT-S) 8.6-50 MG tablet, Take 2 tablets by mouth 2 (two) times daily. While you are on pain medication. (Patient not taking: Reported on 07/05/2021), Disp: , Rfl:    tamsulosin (FLOMAX) 0.4 MG CAPS capsule, Take 2 capsules (0.8 mg total) by mouth daily after supper., Disp: 30 capsule, Rfl:    torsemide (DEMADEX) 20 MG tablet, Take 2 tablets po daily in am, Disp: 60 tablet, Rfl: 0   triamcinolone cream (KENALOG) 0.1 %, Apply 1 application topically 3 (three) times daily. (Apply to rash on legs), Disp: , Rfl:    MEDICATION ADHERENCES TIPS AND STRATEGIES Taking medication as prescribed improves patient outcomes in heart failure (reduces hospitalizations, improves symptoms, increases survival) Side effects of medications can be managed by decreasing doses, switching agents, stopping drugs, or adding additional therapy. Please let someone in the [facility] know if you have having bothersome side effects so we can modify your regimen. Do not alter your medication regimen without talking to [place].  Medication reminders can help patients remember to take drugs on time. If you are missing or forgetting doses you can try linking behaviors, using pill boxes, or an electronic reminder like an alarm on your phone or an app. Some people can also get automated phone calls as medication reminders.

## 2021-07-05 NOTE — Patient Instructions (Addendum)
Resume weighing daily and call for an overnight weight gain of 3 pounds or more or a weekly weight gain of more than 5 pounds.  °

## 2021-07-10 DIAGNOSIS — E782 Mixed hyperlipidemia: Secondary | ICD-10-CM | POA: Diagnosis not present

## 2021-07-10 DIAGNOSIS — I5032 Chronic diastolic (congestive) heart failure: Secondary | ICD-10-CM | POA: Diagnosis not present

## 2021-07-10 DIAGNOSIS — I2510 Atherosclerotic heart disease of native coronary artery without angina pectoris: Secondary | ICD-10-CM | POA: Diagnosis not present

## 2021-07-10 DIAGNOSIS — J449 Chronic obstructive pulmonary disease, unspecified: Secondary | ICD-10-CM | POA: Diagnosis not present

## 2021-07-10 DIAGNOSIS — I10 Essential (primary) hypertension: Secondary | ICD-10-CM | POA: Diagnosis not present

## 2021-07-16 ENCOUNTER — Other Ambulatory Visit: Payer: Self-pay

## 2021-07-16 ENCOUNTER — Emergency Department: Payer: PPO

## 2021-07-16 ENCOUNTER — Inpatient Hospital Stay
Admission: EM | Admit: 2021-07-16 | Discharge: 2021-07-20 | DRG: 391 | Disposition: A | Discharge status: Home / Self Care | Payer: PPO | Attending: Internal Medicine | Admitting: Internal Medicine

## 2021-07-16 ENCOUNTER — Encounter: Payer: Self-pay | Admitting: Emergency Medicine

## 2021-07-16 DIAGNOSIS — R1110 Vomiting, unspecified: Secondary | ICD-10-CM | POA: Diagnosis not present

## 2021-07-16 DIAGNOSIS — I10 Essential (primary) hypertension: Secondary | ICD-10-CM | POA: Diagnosis not present

## 2021-07-16 DIAGNOSIS — J690 Pneumonitis due to inhalation of food and vomit: Secondary | ICD-10-CM | POA: Diagnosis present

## 2021-07-16 DIAGNOSIS — Z7952 Long term (current) use of systemic steroids: Secondary | ICD-10-CM | POA: Diagnosis not present

## 2021-07-16 DIAGNOSIS — K224 Dyskinesia of esophagus: Secondary | ICD-10-CM | POA: Diagnosis not present

## 2021-07-16 DIAGNOSIS — I5032 Chronic diastolic (congestive) heart failure: Secondary | ICD-10-CM | POA: Diagnosis present

## 2021-07-16 DIAGNOSIS — Z96653 Presence of artificial knee joint, bilateral: Secondary | ICD-10-CM | POA: Diagnosis present

## 2021-07-16 DIAGNOSIS — I739 Peripheral vascular disease, unspecified: Secondary | ICD-10-CM | POA: Diagnosis not present

## 2021-07-16 DIAGNOSIS — R1115 Cyclical vomiting syndrome unrelated to migraine: Secondary | ICD-10-CM | POA: Diagnosis not present

## 2021-07-16 DIAGNOSIS — Z86711 Personal history of pulmonary embolism: Secondary | ICD-10-CM

## 2021-07-16 DIAGNOSIS — G4733 Obstructive sleep apnea (adult) (pediatric): Secondary | ICD-10-CM | POA: Diagnosis present

## 2021-07-16 DIAGNOSIS — K449 Diaphragmatic hernia without obstruction or gangrene: Secondary | ICD-10-CM | POA: Diagnosis not present

## 2021-07-16 DIAGNOSIS — J449 Chronic obstructive pulmonary disease, unspecified: Secondary | ICD-10-CM | POA: Diagnosis present

## 2021-07-16 DIAGNOSIS — J984 Other disorders of lung: Secondary | ICD-10-CM | POA: Diagnosis not present

## 2021-07-16 DIAGNOSIS — K222 Esophageal obstruction: Secondary | ICD-10-CM | POA: Diagnosis not present

## 2021-07-16 DIAGNOSIS — I2510 Atherosclerotic heart disease of native coronary artery without angina pectoris: Secondary | ICD-10-CM | POA: Diagnosis not present

## 2021-07-16 DIAGNOSIS — D696 Thrombocytopenia, unspecified: Secondary | ICD-10-CM | POA: Diagnosis present

## 2021-07-16 DIAGNOSIS — F419 Anxiety disorder, unspecified: Secondary | ICD-10-CM | POA: Diagnosis not present

## 2021-07-16 DIAGNOSIS — E876 Hypokalemia: Secondary | ICD-10-CM | POA: Diagnosis not present

## 2021-07-16 DIAGNOSIS — K8689 Other specified diseases of pancreas: Secondary | ICD-10-CM | POA: Diagnosis not present

## 2021-07-16 DIAGNOSIS — K209 Esophagitis, unspecified without bleeding: Secondary | ICD-10-CM | POA: Diagnosis present

## 2021-07-16 DIAGNOSIS — Z981 Arthrodesis status: Secondary | ICD-10-CM

## 2021-07-16 DIAGNOSIS — R1013 Epigastric pain: Secondary | ICD-10-CM | POA: Diagnosis not present

## 2021-07-16 DIAGNOSIS — K2100 Gastro-esophageal reflux disease with esophagitis, without bleeding: Secondary | ICD-10-CM | POA: Diagnosis not present

## 2021-07-16 DIAGNOSIS — Z7901 Long term (current) use of anticoagulants: Secondary | ICD-10-CM

## 2021-07-16 DIAGNOSIS — M1990 Unspecified osteoarthritis, unspecified site: Secondary | ICD-10-CM | POA: Diagnosis not present

## 2021-07-16 DIAGNOSIS — R112 Nausea with vomiting, unspecified: Secondary | ICD-10-CM | POA: Diagnosis not present

## 2021-07-16 DIAGNOSIS — I517 Cardiomegaly: Secondary | ICD-10-CM | POA: Diagnosis not present

## 2021-07-16 DIAGNOSIS — J698 Pneumonitis due to inhalation of other solids and liquids: Secondary | ICD-10-CM | POA: Diagnosis not present

## 2021-07-16 DIAGNOSIS — Z9189 Other specified personal risk factors, not elsewhere classified: Secondary | ICD-10-CM

## 2021-07-16 DIAGNOSIS — E870 Hyperosmolality and hypernatremia: Secondary | ICD-10-CM | POA: Diagnosis not present

## 2021-07-16 DIAGNOSIS — Z8673 Personal history of transient ischemic attack (TIA), and cerebral infarction without residual deficits: Secondary | ICD-10-CM

## 2021-07-16 DIAGNOSIS — K2970 Gastritis, unspecified, without bleeding: Secondary | ICD-10-CM | POA: Diagnosis present

## 2021-07-16 DIAGNOSIS — Z7951 Long term (current) use of inhaled steroids: Secondary | ICD-10-CM

## 2021-07-16 DIAGNOSIS — N400 Enlarged prostate without lower urinary tract symptoms: Secondary | ICD-10-CM | POA: Diagnosis not present

## 2021-07-16 DIAGNOSIS — Z79899 Other long term (current) drug therapy: Secondary | ICD-10-CM

## 2021-07-16 DIAGNOSIS — R918 Other nonspecific abnormal finding of lung field: Secondary | ICD-10-CM | POA: Diagnosis not present

## 2021-07-16 DIAGNOSIS — I110 Hypertensive heart disease with heart failure: Secondary | ICD-10-CM | POA: Diagnosis present

## 2021-07-16 DIAGNOSIS — R1310 Dysphagia, unspecified: Secondary | ICD-10-CM | POA: Diagnosis not present

## 2021-07-16 DIAGNOSIS — Z8042 Family history of malignant neoplasm of prostate: Secondary | ICD-10-CM

## 2021-07-16 DIAGNOSIS — E785 Hyperlipidemia, unspecified: Secondary | ICD-10-CM | POA: Diagnosis not present

## 2021-07-16 DIAGNOSIS — H409 Unspecified glaucoma: Secondary | ICD-10-CM | POA: Diagnosis not present

## 2021-07-16 DIAGNOSIS — N281 Cyst of kidney, acquired: Secondary | ICD-10-CM | POA: Diagnosis not present

## 2021-07-16 DIAGNOSIS — I700 Atherosclerosis of aorta: Secondary | ICD-10-CM | POA: Diagnosis not present

## 2021-07-16 DIAGNOSIS — J189 Pneumonia, unspecified organism: Secondary | ICD-10-CM | POA: Diagnosis not present

## 2021-07-16 LAB — BASIC METABOLIC PANEL
Anion gap: 5 (ref 5–15)
BUN: 16 mg/dL (ref 8–23)
CO2: 33 mmol/L — ABNORMAL HIGH (ref 22–32)
Calcium: 8.5 mg/dL — ABNORMAL LOW (ref 8.9–10.3)
Chloride: 108 mmol/L (ref 98–111)
Creatinine, Ser: 0.97 mg/dL (ref 0.61–1.24)
GFR, Estimated: >60 mL/min (ref >60)
Glucose, Bld: 120 mg/dL — ABNORMAL HIGH (ref 70–99)
Potassium: 4.2 mmol/L (ref 3.5–5.1)
Sodium: 146 mmol/L — ABNORMAL HIGH (ref 135–145)

## 2021-07-16 LAB — HEPATIC FUNCTION PANEL
ALT: 13 U/L (ref 0–44)
AST: 14 U/L — ABNORMAL LOW (ref 15–41)
Albumin: 3.2 g/dL — ABNORMAL LOW (ref 3.5–5.0)
Alkaline Phosphatase: 72 U/L (ref 38–126)
Bilirubin, Direct: 0.3 mg/dL — ABNORMAL HIGH (ref 0.0–0.2)
Indirect Bilirubin: 0.9 mg/dL (ref 0.3–0.9)
Total Bilirubin: 1.2 mg/dL (ref 0.3–1.2)
Total Protein: 6.2 g/dL — ABNORMAL LOW (ref 6.5–8.1)

## 2021-07-16 LAB — CBC
HCT: 45.6 % (ref 39.0–52.0)
Hemoglobin: 14 g/dL (ref 13.0–17.0)
MCH: 30.4 pg (ref 26.0–34.0)
MCHC: 30.7 g/dL (ref 30.0–36.0)
MCV: 98.9 fL (ref 80.0–100.0)
Platelets: 148 10*3/uL — ABNORMAL LOW (ref 150–400)
RBC: 4.61 MIL/uL (ref 4.22–5.81)
RDW: 14.8 % (ref 11.5–15.5)
WBC: 10 10*3/uL (ref 4.0–10.5)
nRBC: 0 % (ref 0.0–0.2)

## 2021-07-16 LAB — TROPONIN I (HIGH SENSITIVITY): Troponin I (High Sensitivity): 26 ng/L — ABNORMAL HIGH (ref <18)

## 2021-07-16 LAB — URINALYSIS, ROUTINE W REFLEX MICROSCOPIC
Bilirubin Urine: NEGATIVE
Glucose, UA: NEGATIVE mg/dL
Hgb urine dipstick: NEGATIVE
Ketones, ur: NEGATIVE mg/dL
Leukocytes,Ua: NEGATIVE
Nitrite: NEGATIVE
Protein, ur: NEGATIVE mg/dL
Specific Gravity, Urine: 1.043 — ABNORMAL HIGH (ref 1.005–1.030)
pH: 7 (ref 5.0–8.0)

## 2021-07-16 LAB — LIPASE, BLOOD: Lipase: 22 U/L (ref 11–51)

## 2021-07-16 MED ORDER — TAMSULOSIN HCL 0.4 MG PO CAPS: 0.8000 mg | ORAL_CAPSULE | Freq: Every day | ORAL | Status: DC

## 2021-07-16 MED ORDER — ROSUVASTATIN CALCIUM 10 MG PO TABS
10.0000 mg | ORAL_TABLET | Freq: Every day | ORAL | Status: DC
  Administered 2021-07-17: 10 mg via ORAL
  Filled 2021-07-16 (×2): qty 1

## 2021-07-16 MED ORDER — TORSEMIDE 20 MG PO TABS: 40.0000 mg | ORAL_TABLET | Freq: Every day | ORAL | Status: DC

## 2021-07-16 MED ORDER — DEXTROSE-NACL 5-0.45 % IV SOLN: INTRAVENOUS | Status: DC

## 2021-07-16 MED ORDER — SODIUM CHLORIDE 0.9 % IV BOLUS
500.0000 mL | Freq: Once | INTRAVENOUS | Status: AC
  Administered 2021-07-16: 500 mL via INTRAVENOUS

## 2021-07-16 MED ORDER — PIPERACILLIN-TAZOBACTAM 3.375 G IVPB
3.3750 g | Freq: Three times a day (TID) | INTRAVENOUS | Status: DC
  Administered 2021-07-17 (×2): 3.375 g via INTRAVENOUS
  Filled 2021-07-16 (×2): qty 50

## 2021-07-16 MED ORDER — ACETAMINOPHEN 650 MG RE SUPP: 650.0000 mg | Freq: Four times a day (QID) | RECTAL | Status: DC | PRN

## 2021-07-16 MED ORDER — APIXABAN 5 MG PO TABS
5.0000 mg | ORAL_TABLET | Freq: Two times a day (BID) | ORAL | Status: DC
  Administered 2021-07-17: 5 mg via ORAL
  Filled 2021-07-16: qty 1

## 2021-07-16 MED ORDER — PANTOPRAZOLE SODIUM 40 MG PO TBEC: 40.0000 mg | DELAYED_RELEASE_TABLET | Freq: Every day | ORAL | Status: DC

## 2021-07-16 MED ORDER — DONEPEZIL HCL 5 MG PO TABS: 5.0000 mg | ORAL_TABLET | Freq: Every day | ORAL | Status: DC

## 2021-07-16 MED ORDER — PIPERACILLIN-TAZOBACTAM 3.375 G IVPB: 3.3750 g | Freq: Three times a day (TID) | INTRAVENOUS | Status: DC

## 2021-07-16 MED ORDER — PIPERACILLIN-TAZOBACTAM 3.375 G IVPB 30 MIN
3.3750 g | Freq: Once | INTRAVENOUS | Status: AC
  Administered 2021-07-16: 3.375 g via INTRAVENOUS
  Filled 2021-07-16: qty 50

## 2021-07-16 MED ORDER — PREDNISONE 10 MG PO TABS: 5.0000 mg | ORAL_TABLET | Freq: Every day | ORAL | Status: DC

## 2021-07-16 MED ORDER — POTASSIUM CHLORIDE CRYS ER 20 MEQ PO TBCR: 40.0000 meq | EXTENDED_RELEASE_TABLET | Freq: Every day | ORAL | Status: DC

## 2021-07-16 MED ORDER — ONDANSETRON HCL 4 MG/2ML IJ SOLN
4.0000 mg | Freq: Once | INTRAMUSCULAR | Status: AC
  Administered 2021-07-16: 4 mg via INTRAVENOUS
  Filled 2021-07-16: qty 2

## 2021-07-16 MED ORDER — LATANOPROST 0.005 % OP SOLN
1.0000 [drp] | Freq: Every day | OPHTHALMIC | Status: DC
Start: 2021-07-17 — End: 2021-07-20
  Administered 2021-07-17 – 2021-07-19 (×3): 1 [drp] via OPHTHALMIC
  Filled 2021-07-16: qty 2.5

## 2021-07-16 MED ORDER — ACETAMINOPHEN 325 MG PO TABS: 650.0000 mg | ORAL_TABLET | Freq: Four times a day (QID) | ORAL | Status: DC | PRN

## 2021-07-16 MED ORDER — DORZOLAMIDE HCL-TIMOLOL MAL 2-0.5 % OP SOLN
1.0000 [drp] | Freq: Two times a day (BID) | OPHTHALMIC | Status: DC
Start: 2021-07-17 — End: 2021-07-20
  Administered 2021-07-17 – 2021-07-20 (×7): 1 [drp] via OPHTHALMIC
  Filled 2021-07-16: qty 10

## 2021-07-16 MED ORDER — METOPROLOL SUCCINATE ER 25 MG PO TB24: 12.5000 mg | ORAL_TABLET | Freq: Every day | ORAL | Status: DC

## 2021-07-16 MED ORDER — MIDODRINE HCL 5 MG PO TABS: 10.0000 mg | ORAL_TABLET | Freq: Three times a day (TID) | ORAL | Status: DC

## 2021-07-16 MED ORDER — LORATADINE 10 MG PO TABS: 10.0000 mg | ORAL_TABLET | Freq: Every day | ORAL | Status: DC

## 2021-07-16 MED ORDER — FLUDROCORTISONE ACETATE 0.1 MG PO TABS
100.0000 ug | ORAL_TABLET | Freq: Every day | ORAL | Status: DC
  Filled 2021-07-16: qty 1

## 2021-07-16 MED ORDER — IOHEXOL 300 MG/ML  SOLN
100.0000 mL | Freq: Once | INTRAMUSCULAR | Status: AC | PRN
Start: 2021-07-16 — End: 2021-07-16
  Administered 2021-07-16: 100 mL via INTRAVENOUS

## 2021-07-16 MED ORDER — SENNOSIDES-DOCUSATE SODIUM 8.6-50 MG PO TABS: 2.0000 | ORAL_TABLET | Freq: Two times a day (BID) | ORAL | Status: DC | PRN

## 2021-07-16 MED ORDER — PIPERACILLIN-TAZOBACTAM 3.375 G IVPB 30 MIN: 3.3750 g | Freq: Three times a day (TID) | INTRAVENOUS | Status: DC

## 2021-07-16 NOTE — Assessment & Plan Note (Deleted)
Chronic problem. Concern for recurrent stricture, Barrett's  [facility] consult for EGD

## 2021-07-16 NOTE — Assessment & Plan Note (Addendum)
Severe esophageal dysmotility Emesis Esophagitis Cause of aspiration pneumonia  Patient with emesis after eating w/o nausea. He denies a globus sensation or sense of food getting stuck. Per his daughter he does seem to frequently choke when speaking, eating bread but she confirms he has been able to take his medications and fluids. He had esophageal dilation at [facility] more than 10 years ago.  IV hydration  PPI  SLP evaluation of swallow  GI consult- plan for EGD

## 2021-07-16 NOTE — Assessment & Plan Note (Addendum)
Due to severe esophageal dysmotility and stricture.  Per daughter he appears to frequently aspirate when talking or eating and drinking. He has LLL patchy air-space disease on CT and CXR. NO fever, no leukocytosis suggestive of chemical pneumonitis from aspiration  GI following planning for EGD  Transition to IV meds   clear diet per SPL  Aspiration precautions - upright for any PO intake  Zosyn was d/c, no apparent pneumonia, imaging including CT chest is equivocal, if fever or worsening cough would restart abx

## 2021-07-16 NOTE — ED Provider Notes (Signed)
[facility] Provider Note    Event Date/Time   First MD Initiated Contact with Patient 07/16/21 1719     (approximate)   History   Chief Complaint: Emesis   HPI  Chris Kent is a 86 y.o. male with a history of hypertension, BPH, COPD, CAD, spinal fusion, esophageal stricture, PE who is brought to the ED due to vomiting for the past 7 days, no bowel movement for the past 3 days.  Feels like his abdomen is getting distended.  Also reports some shortness of breath.  Reports he has a history of esophageal stricture requiring endoscopic dilation.  This was last done at [facility] several years ago.  Denies fever, denies hematemesis.     Physical Exam   Triage Vital Signs: ED Triage Vitals  Enc Vitals Group     BP 07/16/21 1412 116/87     Pulse Rate 07/16/21 1412 79     Resp 07/16/21 1412 18     Temp 07/16/21 1412 98.1 F (36.7 C)     Temp src --      SpO2 07/16/21 1412 94 %     Weight 07/16/21 1414 227 lb (103 kg)     Height 07/16/21 1414 '5\' 9"'$  (1.753 m)     Head Circumference --      Peak Flow --      Pain Score 07/16/21 1414 0     Pain Loc --      Pain Edu? --      Excl. in [place]? --     Most recent vital signs: Vitals:   07/16/21 1930 07/16/21 2030  BP: (!) 116/54 116/65  Pulse: 79 85  Resp: 14 20  Temp:    SpO2: 93% 91%    General: Awake, no distress.  CV:  Good peripheral perfusion.  Regular rate rhythm Resp:  Normal effort.  Good air movement.  Crackles bilateral bases. Abd:  Mildly distended.  Soft, nontender  other:  No lower extremity edema.  Dry mucous membranes.   ED Results / Procedures / Treatments   Labs (all labs ordered are listed, but only abnormal results are displayed) Labs Reviewed  CBC - Abnormal; Notable for the following components:      Result Value   Platelets 148 (*)    All other components within normal limits  BASIC METABOLIC PANEL - Abnormal; Notable for the following components:   Sodium 146 (*)     CO2 33 (*)    Glucose, Bld 120 (*)    Calcium 8.5 (*)    All other components within normal limits  HEPATIC FUNCTION PANEL - Abnormal; Notable for the following components:   Total Protein 6.2 (*)    Albumin 3.2 (*)    AST 14 (*)    Bilirubin, Direct 0.3 (*)    All other components within normal limits  TROPONIN I (HIGH SENSITIVITY) - Abnormal; Notable for the following components:   Troponin I (High Sensitivity) 26 (*)    All other components within normal limits  LIPASE, BLOOD  URINALYSIS, ROUTINE W REFLEX MICROSCOPIC     EKG Interpreted by me Sinus rhythm, rate of 65.  Left axis, right bundle branch block.  No acute ischemic changes.   RADIOLOGY Chest x-ray viewed and interpreted by me, shows some slight basilar infiltrate.  Radiology report reviewed  CT abdomen pelvis viewed and interpreted by me, negative for free air, free fluid, bowel obstruction.  Gallbladder surgically absent.  There is bilateral  basilar pulmonary infiltrate concerning for aspiration pneumonia.  Radiology report reviewed   PROCEDURES:  Procedures   MEDICATIONS ORDERED IN ED: Medications  piperacillin-tazobactam (ZOSYN) IVPB 3.375 g (3.375 g Intravenous New Bag/Given 07/16/21 2105)  sodium chloride 0.9 % bolus 500 mL (0 mLs Intravenous Stopped 07/16/21 1859)  ondansetron (ZOFRAN) injection 4 mg (4 mg Intravenous Given 07/16/21 1803)  iohexol (OMNIPAQUE) 300 MG/ML solution 100 mL (100 mLs Intravenous Contrast Given 07/16/21 1944)     IMPRESSION / MDM / ASSESSMENT AND PLAN / ED COURSE  I reviewed the triage vital signs and the nursing notes.                              Differential diagnosis includes, but is not limited to, pancreatitis, choledocholithiasis, small bowel obstruction, hernia, enteritis, dehydration, electrolyte abnormality, aspiration pneumonia, esophageal obstruction  Patient's presentation is most consistent with acute presentation with potential threat to life or bodily  function.  Patient presents with intractable vomiting and p.o. intolerance for the last 7 days, clinically appears dehydrated.  Serum labs unremarkable.  Troponin is at chronic baseline and he does not have specific cardiac symptoms so no further cardiac work-up is needed.  Chest x-ray and CT scan of the abdomen and pelvis reveal evidence of aspiration pneumonia.  He has borderline oxygen of about 92% on room air.  He is not septic, not in distress.  However, he will need to remain n.p.o., IV fluid hydration, IV Zosyn for aspiration pneumonia coverage, and admission for further GI evaluation.      FINAL CLINICAL IMPRESSION(S) / ED DIAGNOSES   Final diagnoses:  Intractable vomiting  Aspiration pneumonia of both lower lobes due to vomit ([place])     Rx / DC Orders   ED Discharge Orders     None        Note:  This document was prepared using Dragon voice recognition software and may include unintentional dictation errors.   Carrie Mew, MD 07/16/21 2135

## 2021-07-16 NOTE — ED Triage Notes (Signed)
Pt via POV from home. Pt c/o vomiting for the past 9 days. Denies any other symptoms. Pt is A&OX4 and NAD

## 2021-07-16 NOTE — Assessment & Plan Note (Addendum)
BP is stable  Continue home medications

## 2021-07-16 NOTE — Assessment & Plan Note (Addendum)
Followed by cardiology for CAD w/o angina.  Lipid panel in AM  Continue current medications

## 2021-07-16 NOTE — Assessment & Plan Note (Addendum)
Patient's last echo 02/21/21 with EF nl,. Per Dr. Saralyn Pilar last [place] note patient doing well on present regimen for HFpEF. On exam rales at left base but without evidence of decompensation.  IV equivalent home meds

## 2021-07-16 NOTE — H&P (Signed)
History and Physical    Chris Kent KNL:[id number] DOB: [birth year] DOA: 07/16/2021  DOS: the patient was seen and examined on 07/16/2021  PCP: Idelle Crouch, MD   Patient coming from: Home  I have personally briefly reviewed patient's old medical records in [facility]  [name], an 86 y/o, with h/o CAD, HFpEF, h/o PE on Eliquis, h/o esophagitis with remote dilation of esophageal stricture at [facility] many years ago, HTN. He reports that for about 1 week he has had intermittent emesis immediately after he eats. He clearly denies a sensation of food getting stuck as he swallows. He denies any nausea prior to vomiting. He has been able to take his medications and drink clear liquids, e.g. apple juice. He has had a regular bowel habit. He denies any weight loss. His abdomen is always protuberant and he denies abdominal pain. Per his daughter he has not had any facial droop, slurred speech , focal weakness or other neurologic symptoms.  Because of hispersistent symptoms he presents to [facility]ED for furhter evaluation.    ED Course: vitals stable w/o fever. Not in distress on presntation. Lab: Na 146, Alb 3.2, T. Protein 6.2 Troponin 26  WBC 10, Hct 45.6. CT A/P reveals patchy air-space changes left base. CXR LLL ASD. TRH called to admit for further evaluation, IV hydration.  Review of Systems:  Review of Systems  Constitutional: Negative.   HENT: Negative.    Eyes: Negative.   Respiratory:  Positive for cough. Negative for sputum production, shortness of breath and wheezing.   Cardiovascular: Negative.   Gastrointestinal:  Positive for vomiting. Negative for abdominal pain, diarrhea and heartburn.  Genitourinary: Negative.   Musculoskeletal:  Positive for back pain and neck pain.       Chronic neck and back pain  Skin: Negative.   Neurological: Negative.   Psychiatric/Behavioral: Negative.      Past Medical History:  Diagnosis Date   Anxiety    Arthritis    Asthma    CHF  (congestive heart failure) (HCC)    Coronary artery disease    GERD (gastroesophageal reflux disease)    Glaucoma    Glaucoma    History of stroke    Hyperlipidemia    Hyperlipidemia    Hypertension    Peripheral vascular disease ([place])    Sleep apnea    Stroke [facility])     Past Surgical History:  Procedure Laterality Date   REPLACEMENT TOTAL KNEE BILATERAL      Soc Hx- 1st marriage ended in divorce. 2nd marriage > 40 years - widowed. One Daughter. 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 Has his own home. Grandson and his girlfriend live with him. He gardens. Is independent   reports that he has never smoked. He has never used smokeless tobacco. He reports that he does not drink alcohol and does not use drugs.  No Known Allergies  Family History  Problem Relation Age of Onset   Prostate cancer Father     Prior to Admission medications   Medication Sig Start Date End Date Taking? Authorizing Provider  apixaban (ELIQUIS) 5 MG TABS tablet Take two tablets twice a day for six days then one tablet twice a day afterwards 02/26/21  Yes Wieting, Richard, MD  budesonide (PULMICORT) 0.5 MG/2ML nebulizer solution Take 2 mLs by nebulization as directed. 07/08/21  Yes [provider]  chlorpheniramine-HYDROcodone 10-8 MG/5ML Take 5 mLs by mouth every 12 (twelve) hours. 07/11/21  Yes [provider]  donepezil (ARICEPT) 5 MG tablet Take 5 mg by mouth daily. 07/03/21  Yes [provider]  Dorzolamide HCl-Timolol Mal PF 22.3-6.8 MG/ML SOLN Place 1 drop into the right eye 2 (two) times daily.   Yes [provider]  fludrocortisone (FLORINEF) 0.1 MG tablet Take 100 mcg by mouth daily. 06/27/21  Yes [provider]  latanoprost (XALATAN) 0.005 % ophthalmic solution Place 1 drop into both eyes at bedtime.   Yes [provider]  potassium chloride SA (KLOR-CON M) 20 MEQ tablet Take 2 tablets (40 mEq total) by mouth daily. 07/05/21  Yes  Hackney, Tina A, FNP  predniSONE (DELTASONE) 5 MG tablet Take 5 mg by mouth daily. 06/04/21  Yes [provider]  rosuvastatin (CRESTOR) 10 MG tablet Take 10 mg by mouth at bedtime.   Yes [provider]  tamsulosin (FLOMAX) 0.4 MG CAPS capsule Take 2 capsules (0.8 mg total) by mouth daily after supper. 10/31/20  Yes Bonnielee Haff, MD  torsemide [facility]) 20 MG tablet Take 2 tablets po daily in am 02/27/21  Yes Wieting, Richard, MD  cetirizine (ZYRTEC) 10 MG tablet Take 10 mg by mouth at bedtime.    [provider]  Cyanocobalamin (VITAMIN B 12) 500 MCG TABS Take 500 mcg by mouth daily. 02/26/21   Loletha Grayer, MD  ergocalciferol (VITAMIN D2) 1.25 MG (50000 UT) capsule Take 1 capsule (50,000 Units total) by mouth once a week. 07/05/21   Alisa Graff, FNP  ergocalciferol (VITAMIN D2) 1.25 MG (50000 UT) capsule Take 50,000 Units by mouth every 7 (seven) days. 07/05/21   [provider]  metoprolol succinate (TOPROL-XL) 25 MG 24 hr tablet Take 12.5 mg by mouth daily. 06/03/21   [provider]  midodrine (PROAMATINE) 10 MG tablet Take 10 mg by mouth 3 (three) times daily. 06/13/21   [provider]  senna-docusate (SENOKOT-S) 8.6-50 MG tablet Take 2 tablets by mouth 2 (two) times daily. While you are on pain medication. Patient not taking: Reported on 07/05/2021 11/30/20   Enzo Bi, MD  triamcinolone cream (KENALOG) 0.1 % Apply 1 application topically 3 (three) times daily. (Apply to rash on legs)    [provider]    Physical Exam: Vitals:   07/16/21 2130 07/16/21 2200 07/16/21 2230 07/16/21 2300  BP: 133/75 138/89  138/80  Pulse: 90 91  83  Resp: 19 18  (!) 21  Temp:      SpO2: 93% (!) 89% 93% 94%  Weight:      Height:        Physical Exam Vitals and nursing note reviewed.  Constitutional:      General: He is not in acute distress.    Appearance: Normal appearance. He is obese. He is not ill-appearing.  HENT:     Head:  Normocephalic and atraumatic.     Nose: Nose normal.     Mouth/Throat:     Mouth: Mucous membranes are moist.     Comments: Missing several teeth, no dentures Eyes:     Extraocular Movements: Extraocular movements intact.     Conjunctiva/sclera: Conjunctivae normal.     Pupils: Pupils are equal, round, and reactive to light.  Cardiovascular:     Rate and Rhythm: Normal rate and regular rhythm.     Pulses: Normal pulses.     Heart sounds: Normal heart sounds.  Pulmonary:     Effort: No tachypnea, accessory muscle usage or retractions.     Breath sounds: Examination of  the left-lower field reveals decreased breath sounds and rhonchi. Decreased breath sounds and rhonchi present. No rales.  Abdominal:     General: Abdomen is protuberant. Bowel sounds are decreased. There is no distension.     Palpations: Abdomen is soft. There is no shifting dullness, hepatomegaly or mass.     Tenderness: There is no abdominal tenderness.  Musculoskeletal:        General: No swelling or deformity. Normal range of motion.     Cervical back: Normal range of motion. No rigidity or tenderness.     Right lower leg: No edema.     Left lower leg: No edema.  Skin:    General: Skin is warm and dry.  Neurological:     General: No focal deficit present.     Mental Status: He is alert and oriented to person, place, and time.     Cranial Nerves: No cranial nerve deficit.     Motor: No weakness.  Psychiatric:        Mood and Affect: Mood normal.        Behavior: Behavior normal.      Labs on Admission: I have personally reviewed following labs and imaging studies  CBC: Recent Labs  Lab 07/16/21 1416  WBC 10.0  HGB 14.0  HCT 45.6  MCV 98.9  PLT 096*   Basic Metabolic Panel: Recent Labs  Lab 07/16/21 1416  NA 146*  K 4.2  CL 108  CO2 33*  GLUCOSE 120*  BUN 16  CREATININE 0.97  CALCIUM 8.5*   GFR: Estimated Creatinine Clearance: 67 mL/min (by C-G formula based on SCr of 0.97 mg/dL). Liver  Function Tests: Recent Labs  Lab 07/16/21 1416  AST 14*  ALT 13  ALKPHOS 72  BILITOT 1.2  PROT 6.2*  ALBUMIN 3.2*   Recent Labs  Lab 07/16/21 1416  LIPASE 22   No results for input(s): "AMMONIA" in the last 168 hours. Coagulation Profile: No results for input(s): "INR", "PROTIME" in the last 168 hours. Cardiac Enzymes: No results for input(s): "CKTOTAL", "CKMB", "CKMBINDEX", "TROPONINI" in the last 168 hours. BNP (last 3 results) No results for input(s): "PROBNP" in the last 8760 hours. HbA1C: No results for input(s): "HGBA1C" in the last 72 hours. CBG: No results for input(s): "GLUCAP" in the last 168 hours. Lipid Profile: No results for input(s): "CHOL", "HDL", "LDLCALC", "TRIG", "CHOLHDL", "LDLDIRECT" in the last 72 hours. Thyroid Function Tests: No results for input(s): "TSH", "T4TOTAL", "FREET4", "T3FREE", "THYROIDAB" in the last 72 hours. Anemia Panel: No results for input(s): "VITAMINB12", "FOLATE", "FERRITIN", "TIBC", "IRON", "RETICCTPCT" in the last 72 hours. Urine analysis:    Component Value Date/Time   COLORURINE YELLOW (A) 02/25/2021 0331   APPEARANCEUR CLEAR (A) 02/25/2021 0331   APPEARANCEUR Clear 11/21/2017 1011   LABSPEC 1.026 02/25/2021 0331   LABSPEC 1.013 09/20/2012 0122   PHURINE 6.0 02/25/2021 0331   GLUCOSEU NEGATIVE 02/25/2021 0331   GLUCOSEU Negative 09/20/2012 0122   HGBUR SMALL (A) 02/25/2021 0331   BILIRUBINUR NEGATIVE 02/25/2021 0331   BILIRUBINUR Negative 11/21/2017 1011   BILIRUBINUR Negative 09/20/2012 0122   KETONESUR NEGATIVE 02/25/2021 0331   PROTEINUR NEGATIVE 02/25/2021 0331   NITRITE NEGATIVE 02/25/2021 0331   LEUKOCYTESUR NEGATIVE 02/25/2021 0331   LEUKOCYTESUR Negative 09/20/2012 0122    Radiological Exams on Admission: I have personally reviewed images CT ABDOMEN PELVIS W CONTRAST  Result Date: 07/16/2021 CLINICAL DATA:  Vomiting for 7 days. EXAM: CT ABDOMEN AND PELVIS WITH CONTRAST TECHNIQUE: Multidetector CT imaging  of the abdomen and pelvis was performed using the standard protocol following bolus administration of intravenous contrast. RADIATION DOSE REDUCTION: This exam was performed according to the departmental dose-optimization program which includes automated exposure control, adjustment of the mA and/or kV according to patient size and/or use of iterative reconstruction technique. CONTRAST:  170m OMNIPAQUE IOHEXOL 300 MG/ML  SOLN COMPARISON:  September of 2022. FINDINGS: Lower chest: Patchy basilar airspace disease in the dependent LEFT and RIGHT chest without effusion. Small pericardial effusion is smaller than on previous exams, heart is incompletely imaged. Hepatobiliary: Post cholecystectomy. Smooth contour of the liver. No focal, suspicious hepatic lesion. No biliary duct dilation. Portal vein is patent. Pancreas: Mild pancreatic atrophy without ductal dilation or inflammation. Spleen: Normal. Adrenals/Urinary Tract: Adrenal glands are normal. Symmetric renal enhancement without signs of hydronephrosis. Renal cortical scarring with 2 cm LEFT renal cyst for which no follow-up is recommended in the absence of localizing symptoms. No perivesical stranding. No hydronephrosis. Stomach/Bowel: No stranding adjacent to the stomach. No small bowel dilation or signs of small bowel inflammation. No dilation of the colon. No signs of colonic inflammation. Vascular/Lymphatic: Aortic atherosclerosis. No sign of aneurysm. Smooth contour of the IVC. There is no gastrohepatic or hepatoduodenal ligament lymphadenopathy. No retroperitoneal or mesenteric lymphadenopathy. No pelvic sidewall lymphadenopathy. Atherosclerotic changes are moderate with mix calcified and noncalcified plaque. Reproductive: Unremarkable by CT. Other: No ascites.  No pneumoperitoneum. Musculoskeletal: Signs of spinal fusion of the lower thoracic and upper lumbar spine, incompletely imaged on CT. Bilateral degenerative changes of the hips. Changes related to  ankylosing spondylitis in the spine. IMPRESSION: 1. Patchy basilar airspace disease in the dependent LEFT and RIGHT chest without effusion, suspicious for mild aspiration change mixed with atelectasis. 2. No acute intra-abdominal or pelvic process. 3. Post cholecystectomy. 4. 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 5. Aortic atherosclerosis. Aortic Atherosclerosis (ICD10-I70.0). Electronically Signed   By: GZetta BillsM.D.   On: 07/16/2021 20:10   DG Chest 2 View  Result Date: 07/16/2021 CLINICAL DATA:  Cough, shortness of breath and vomiting. EXAM: CHEST - 2 VIEW COMPARISON:  February 24, 2021. FINDINGS: EKG leads project over the chest. Cardiomediastinal contours and hilar structures are stable. Mild LEFT lower lobe airspace disease is similar to prior imaging. No sign of pleural effusion. IMPRESSION: Mild LEFT lower lobe airspace disease is similar to prior imaging, likely scarring given lack of change. No new areas of consolidation or sign of pleural effusion. Electronically Signed   By: GZetta BillsM.D.   On: 07/16/2021 18:12    EKG: I have personally reviewed EKG: Sinus rhythm, RBBB, LAFB, no acute changes  Assessment/Plan Principal Problem:   Emesis, persistent Active Problems:   Aspiration pneumonia (HCC)   Essential hypertension, benign   Hyperlipidemia   Esophagitis   Chronic diastolic CHF (congestive heart failure) (HCC)    Assessment and Plan: * Emesis, persistent Patient with emesis after eating w/o nausea. He denies a globus sensation or sense of food getting stuck. Per his daughter he does seem to frequently choke when speaking, eating bread but she confirms he has been able to take his medications and fluids. He had esophageal dilation at [facility] more than 10 years ago.  Plan Med surg admit  IV hydration  PPI  SLP evaluation of swallow  GI consult- possible EGD  May come to gastric  emptying study of EGD unrevealing.   Aspiration pneumonia ([place] Patient with emesis.  Per daughter he appears to frequently aspirate when talking or eating and drinking. He has LLL patchy air-space disease on CT and CXR. NO fever, no leukocytosis suggestive of chemical pneumonitis from aspiration  Plan Continue meds and clear diet  Aspiration precautions - upright for any PO intake  SLP evaluation  Zosyn IV q8 x 6 doses  F/u CXR]  F/u CBCD  Chronic diastolic CHF (congestive heart failure) ([place]) Patient's last echo 02/21/21 with EF nl,. Per Dr. Saralyn Pilar last [place] note patient doing well on present regimen for HFpEF. On exam rales at left base but without evidence of decompensation.  Plan Continue home medications  Esophagitis Chronic problem. Concern for recurrent stricture, Barrett's  Pla GI consult for EGD  Hyperlipidemia Followed by cardiology for CAD w/o angina.  Plan Lipid panel in AM  Continue current medications  Essential hypertension, benign BP is stable  Continue home medications       DVT prophylaxis: Eliquis Code Status: Full Code Family Communication: spoke with daughter - confirming symptoms. She understands Tx plan  Disposition Plan: home when stable  Consults called: GI consult -messaged by secure chat for non-urgent consult  Admission status: Inpatient, Med-Surg   Adella Hare, MD [facility] 07/16/2021, 11:49 PM

## 2021-07-16 NOTE — Subjective & Objective (Signed)
[name], an 86 y/o, with h/o CAD, HFpEF, h/o PE on Eliquis, h/o esophagitis with remote dilation of esophageal stricture at [place] many years ago, HTN. He reports that for about 1 week he has had intermittent emesis immediately after he eats. He clearly denies a sensation of food getting stuck as he swallows. He denies any nausea prior to vomiting. He has been able to take his medications and drink clear liquids, e.g. apple juice. He has had a regular bowel habit. He denies any weight loss. His abdomen is always protuberant and he denies abdominal pain. Per his daughter he has not had any facial droop, slurred speech , focal weakness or other neurologic symptoms.  Because of hispersistent symptoms he presents to [facility]ED for furhter evaluation.

## 2021-07-16 NOTE — ED Triage Notes (Signed)
First Nurse Note:  C/O vomiting x 7 days.  Patient has history of PE and heart failure.  Patient's family states patient has been unable to tolerate anything PO, including medications, x 1 week.  Patient is AAOx3.  Skin warm and dry. NAD

## 2021-07-17 ENCOUNTER — Inpatient Hospital Stay: Payer: PPO

## 2021-07-17 DIAGNOSIS — Z7952 Long term (current) use of systemic steroids: Secondary | ICD-10-CM

## 2021-07-17 DIAGNOSIS — K222 Esophageal obstruction: Secondary | ICD-10-CM

## 2021-07-17 LAB — CBC
HCT: 40.8 % (ref 39.0–52.0)
Hemoglobin: 12.7 g/dL — ABNORMAL LOW (ref 13.0–17.0)
MCH: 30.1 pg (ref 26.0–34.0)
MCHC: 31.1 g/dL (ref 30.0–36.0)
MCV: 96.7 fL (ref 80.0–100.0)
Platelets: 145 10*3/uL — ABNORMAL LOW (ref 150–400)
RBC: 4.22 MIL/uL (ref 4.22–5.81)
RDW: 14.6 % (ref 11.5–15.5)
WBC: 9.9 10*3/uL (ref 4.0–10.5)
nRBC: 0 % (ref 0.0–0.2)

## 2021-07-17 MED ORDER — STERILE WATER FOR INJECTION IJ SOLN
INTRAMUSCULAR | Status: AC
  Filled 2021-07-17: qty 10

## 2021-07-17 MED ORDER — PANTOPRAZOLE SODIUM 40 MG IV SOLR
40.0000 mg | INTRAVENOUS | Status: DC
  Administered 2021-07-17 – 2021-07-19 (×3): 40 mg via INTRAVENOUS
  Filled 2021-07-17 (×3): qty 10

## 2021-07-17 MED ORDER — FUROSEMIDE 10 MG/ML IJ SOLN
40.0000 mg | Freq: Every day | INTRAMUSCULAR | Status: DC
  Administered 2021-07-17 – 2021-07-20 (×4): 40 mg via INTRAVENOUS
  Filled 2021-07-17 (×4): qty 4

## 2021-07-17 MED ORDER — METOPROLOL TARTRATE 5 MG/5ML IV SOLN
5.0000 mg | Freq: Two times a day (BID) | INTRAVENOUS | Status: DC
  Administered 2021-07-17 – 2021-07-20 (×4): 5 mg via INTRAVENOUS
  Filled 2021-07-17 (×5): qty 5

## 2021-07-17 MED ORDER — METHYLPREDNISOLONE SODIUM SUCC 40 MG IJ SOLR
40.0000 mg | Freq: Every day | INTRAMUSCULAR | Status: DC
  Administered 2021-07-17 – 2021-07-19 (×3): 40 mg via INTRAVENOUS
  Filled 2021-07-17 (×3): qty 1

## 2021-07-17 NOTE — Assessment & Plan Note (Addendum)
Reviewed outpatient notes, appears PCP had started him on fludrocortisone 100 mcg and referred to pulmonology, who started daily low dose prednisone 5 mg daily - RN confirmed pt is on both, discussed w/ pharmacy and taking both is equivalent 55 mg prednisone per day  Hydrocortisone is on very short supply, so placed on solumedrol 40 mg IV daily until can take po then would taper w/ prednisone

## 2021-07-17 NOTE — Evaluation (Addendum)
Clinical/Bedside Swallow Evaluation Patient Details  Name: Chris Kent MRN: [id number] Date of Birth: May 20, 1935  Today's Date: 07/17/2021 Time: SLP Start Time (ACUTE ONLY): 48 SLP Stop Time (ACUTE ONLY): 1330 SLP Time Calculation (min) (ACUTE ONLY): 60 min  Past Medical History:  Past Medical History:  Diagnosis Date   Anxiety    Arthritis    Asthma    CHF (congestive heart failure) (HCC)    Coronary artery disease    GERD (gastroesophageal reflux disease)    Glaucoma    Glaucoma    History of stroke    Hyperlipidemia    Hyperlipidemia    Hypertension    Peripheral vascular disease (HCC)    Sleep apnea    Stroke [facility])    Past Surgical History:  Past Surgical History:  Procedure Laterality Date   REPLACEMENT TOTAL KNEE BILATERAL     HPI:  Pt is an 86 y/o, with h/o CAD, HFpEF, h/o PE on Eliquis, h/o esophagitis and stricture with remote dilation of esophageal stricture at [facility] ~15 years ago, HTN, and Esophageal Phase Dysmotiltiy, per chart notes.   Currently, pt reports that for 7-9 days, he has been "vomiting" and that for ~2 months, he has had "severe" emesis immediately after he eats -- Family who arrived endorsed "it has gotten really bad in the last 2 months". Pt actually reported the regurgitation has been ongoing "at times" for ~6 months.   He denies a specific sensation of food getting stuck as he swallows. He denies any nausea prior to vomiting.  He and Family reported that he will often "throw up and hold it in his mouth until he can get to the parking lot to spit it out" when eating out at a restaurant.  He has been able to take his medications and drink clear liquids some, e.g. apple juice.  Per his Family, he has not had any facial droop, slurred speech , focal weakness or other neurologic symptoms.  Because of his persistent symptoms he presents to [facility] for furhter evaluation.     CT of Abd.: Patchy basilar airspace disease in the dependent LEFT and RIGHT chest  without effusion, suspicious for mild aspiration change mixed with atelectasis.    CHEST CT 05/28/2021: chronic Left scarring and right airspace dis; distended fluid-filled Esophagus, not appreciably changed since prior exam, which could result from presbyesophagus or Reflux disease.      Assessment / Plan / Recommendation  Clinical Impression   Pt seen for BSE today post return from [facility] Esophagus study. The DG Esophagus study revealed: "Esophageal motility: Severe dysmotility is visualized throughout the entire esophagus, with lack of primary contraction and diffuse tertiary contractions seen; Esophageal stasis is seen due to severe dysmotility. Technically limited exam due to patient immobility(cervical fusion), inability to perform bolus swallowing, and nausea with episode of vomiting during the exam. No evidence of vestibular penetration or aspiration during swallowing."   PT IS AT HIGH RISK FOR ASPIRATION OF REFLUX MATERIAL.     At the BSE, pt appeared to present w/ adequate oropharyngeal phase swallowing function w/ NO overt oropharyngeal phase dysphagia appreciated w/ trials given; No neuromuscular swallowing deficits appreciated. Pt is at reduced risk for aspiration from an oropharyngeal phase standpoint following general aspiration precautions.  Pt has a baseline of "Severe" Esophageal phase dysmotility (as described above in DG Esophagus study) and GERD/REFLUX. He had an EGD w/ Dilation ~15 years ago and was told he "could not have another stretching done again", per pt/Family  report.  ANY Dysmotility or Regurgitation of food/liquid/Reflux material can increase risk for aspiration of the Reflux material during Retrograde flow thus impact Voicing and Pulmonary status, even leading to pneumonia.  Pt described ongoing issues w/ Regurgitation for ~6 months, w/ worsening in the last ~2 months -- pt even described having to vomit in the parking lot of a restaurant. He feels this impacts his ability  to take full meals; even stay hydrated(per Family).   Pt sat upright in bed(H&N ROM limited d/t cervical fusion baseline) and consumed trials of thin liquids Via Cup and Straw, puree and a soft solid(4 trials) w/ no immediate, overt clinical s/s of aspiration noted; clear vocal quality b/t trials, no cough, no decline in pulmonary status, no multiple swallows noted post initial pharyngeal swallow. Oral phase appeared [facility] for bolus management, mastication, and timely A-P transfer/clearing of material. Oral clearing achieved w/ all consistencies.  OM exam was [facility] for lingual/labial movements. No unilateral weakness. Speech rushed/mumbled slightly but intelligible when he slowed down.   Of Note, pt demonstrated fairly quick s/s of Esophageal Retrograde activity w/ just the few trials of each consistency resulting in Regurgitation of the bolus material and thick Phlegm(yellow/brown) for several episodes post the BSE(~5 mins post the last po trial). He reported no discomfort or pain but felt the need to hock/spit/vomit the food/liquid/mucous.   Recommend continue current NPO status until seen/consulted by GI for any diet recommendation. Recommend direct view and possible manometry of the Esophagus. Recommend f/u w/ GI for ongoing management/tx and education. MD to reconsult ST services if any new needs while admitted. Discussed potential Pleasure ice sips/sips of thins; frequent oral care for hygiene and stimulation of swallowing. NSG updated. MD agreed. SLP Visit Diagnosis: Dysphagia, unspecified (R13.10) (Esophageal phase dysmotility per chart notes; assessment)    Aspiration Risk  Mild aspiration risk;Risk for inadequate nutrition/hydration (d/t Regurgitation and Vomiting)    Diet Recommendation   NPO w/ frequent oral care for hygiene and stimulation of swallowing.  Medication Administration: Via alternative means    Other  Recommendations Recommended Consults: Consider GI evaluation;Consider  esophageal assessment Oral Care Recommendations: Oral care QID;Patient independent with oral care (setup support) Other Recommendations:  (n/a)    Recommendations for follow up therapy are one component of a multi-disciplinary discharge planning process, led by the attending physician.  Recommendations may be updated based on patient status, additional functional criteria and insurance authorization.  Follow up Recommendations No SLP follow up (GI f/u)      Assistance Recommended at Discharge None  Functional Status Assessment Patient has not had a recent decline in their functional status  Frequency and Duration  (n/a)   (n/a)       Prognosis Prognosis for Safe Diet Advancement: Guarded (-Fair) Barriers to Reach Goals: Time post onset;Severity of deficits Barriers/Prognosis Comment: GI/Esophageal phase dysmotility      Swallow Study   General Date of Onset: 07/16/21 HPI: Pt is an 86 y/o, with h/o CAD, HFpEF, h/o PE on Eliquis, h/o esophagitis and stricture with remote dilation of esophageal stricture at [facility] ~15 years ago, HTN, and Esophageal Phase Dysmotiltiy, per chart notes.   Currently, pt reports that for 7-9 days, he has been "vomiting" and that for ~2 months, he has had "severe" emesis immediately after he eats -- Family who arrived endorsed "it has gotten really bad in the last 2 months". Pt actually reported the regurgitation has been ongoing "at times" for ~6 months.   He denies a specific  sensation of food getting stuck as he swallows. He denies any nausea prior to vomiting.  He and Family reported that he will often "throw up and hold it in his mouth until he can get to the parking lot to spit it out" when eating out at a restaurant.  He has been able to take his medications and drink clear liquids some, e.g. apple juice.  Per his Family, he has not had any facial droop, slurred speech , focal weakness or other neurologic symptoms.  Because of his persistent symptoms he presents  to [facility] for furhter evaluation.    CT of Abd.: Patchy basilar airspace disease in the dependent LEFT and RIGHT  chest without effusion, suspicious for mild aspiration change mixed  with atelectasis.  CHEST CT 05/28/2021: chronic Left scarring and right airspace dis; distended fluid-filled esophagus, not appreciably changed  since prior exam, which could result from presbyesophagus or reflux  disease. Type of Study: Bedside Swallow Evaluation Previous Swallow Assessment: none Diet Prior to this Study:  (regular diet at home; NPO currently for DG Esophagus) Temperature Spikes Noted: No (wbc 9.9) Respiratory Status: Room air (wears O2 at night) History of Recent Intubation: No Behavior/Cognition: Alert;Cooperative;Pleasant mood Oral Cavity Assessment: Within Functional Limits Oral Care Completed by SLP: Yes Oral Cavity - Dentition: Adequate natural dentition;Missing dentition (just a few upper/front -- lost his partial) Vision: Functional for self-feeding Self-Feeding Abilities: Able to feed self;Needs set up Patient Positioning: Upright in bed (needed full positioning support; cervical/back fusions limited H&N ROM) Baseline Vocal Quality: Normal Volitional Cough: Strong Volitional Swallow: Able to elicit    Oral/Motor/Sensory Function Overall Oral Motor/Sensory Function: Within functional limits   Ice Chips Ice chips: Within functional limits Presentation: Spoon (fed; 3 trials)   Thin Liquid Thin Liquid: Within functional limits Presentation: Cup;Self Fed;Straw (5 trials via each method; then multiple sips x1) Other Comments: regurgitation of liquid/phlegm at end of session    Nectar Thick Nectar Thick Liquid: Not tested   Honey Thick Honey Thick Liquid: Not tested   Puree Puree: Within functional limits Presentation: Self Fed;Spoon (6 trials) Other Comments: regurgitation occurred   Solid     Solid: Within functional limits Presentation: Self Fed (4 trials) Other Comments:  regurgitation occurred          Orinda Kenner, MS, CCC-SLP Speech Language Pathologist Rehab Services; [place] [phone] (ascom) Chris Kent 07/17/2021,2:00 PM

## 2021-07-17 NOTE — Care Plan (Addendum)
Brief GI Note UGI study reviewed Pt with severe dysmotility contributing to his sx. Difficult to assess but GEJ with smooth taper suspicious for stenosis/stricture. Have d/w hospitalist service- will hold eliquis (for 48 hours) for possible egd with dilation on Thursday. Defer diet to speech pathology eval  Patient's daughter Chris Kent updated by telephone. Amenable to plan  Will follow up with patient leading up to procedure.  Chris Helling, DO [facility]

## 2021-07-17 NOTE — Progress Notes (Addendum)
PROGRESS NOTE    KINGSLEY FARACE  XIH:[id number] DOB: 25-Nov-1935  DOA: 07/16/2021 Date of Service: 07/17/21 PCP: Idelle Crouch, MD     Brief Narrative / Hospital Course:  [name], an 86 y/o, with h/o CAD, HFpEF, h/o PE on Eliquis, h/o esophagitis with remote dilation of esophageal stricture at [place] many years ago, HTN. To ED 07/16/21 w/ c/o 1 week intermittent emesis immediately after he eats, no dysphagia/globus sensation, no nausea, no weight loss, able to take his medications and drink clear liquids, e.g. apple juice. Per his daughter he does seem to frequently choke when speaking 06/12: in ED CXR LLL findings concern for aspiration. Mild hypernatremia and thrombocytopenia. Hypokalemia corrected w/ repletion. Admission, pending SLP eval, IV hydration and symptom management, initiated PPI, secure chat to GI sent, uncertain if acknowledged, but recommendations sought re: EGD possible. Started on Zosyn. CT Abd/Pelv no acute intra-abdominal or pelvic process 06/13: Speech therapy saw patient, ok for clears but significant aspiration risk. UGI: "Severe esophageal dysmotility. Stricture at the GE junction. Upper endoscopy is recommended for further evaluation" held eliquis, transitioned meds to IV, GI to plan for EGD no sooner than 06/15 after eliquis held 24 hours   Consultants:  GI  Procedures: none    Subjective: Patient reports feeling overall okay other than hunrgy, mild cough. NO CP/SOB.      ASSESSMENT & PLAN:   Principal Problem:   Esophageal stricture Active Problems:   Aspiration pneumonia (HCC)   Essential hypertension, benign   Hyperlipidemia   Esophagitis   Chronic diastolic CHF (congestive heart failure) (HCC)   Current chronic use of systemic steroids   Esophageal stricture Severe esophageal dysmotility Emesis Esophagitis Cause of aspiration pneumonia  Patient with emesis after eating w/o nausea. He denies a globus sensation or sense of food getting  stuck. Per his daughter he does seem to frequently choke when speaking, eating bread but she confirms he has been able to take his medications and fluids. He had esophageal dilation at [facility] more than 10 years ago. IV hydration PPI SLP evaluation of swallow GI consult- plan for EGD  At high risk for aspiration Due to severe esophageal dysmotility and stricture.  Per daughter he appears to frequently aspirate when talking or eating and drinking. He has LLL patchy air-space disease on CT and CXR. NO fever, no leukocytosis suggestive of chemical pneumonitis from aspiration GI following planning for EGD Transition to IV meds  clear diet per SPL Aspiration precautions - upright for any PO intake Zosyn was d/c, no apparent pneumonia, imaging including CT chest is equivocal, if fever or worsening cough would restart abx   Essential hypertension, benign BP is stable Continue home medications  Hyperlipidemia Followed by cardiology for CAD w/o angina. Lipid panel in AM Continue current medications  Chronic diastolic CHF (congestive heart failure) ([place]) Patient's last echo 02/21/21 with EF nl,. Per Dr. Saralyn Pilar last [facility] note patient doing well on present regimen for HFpEF. On exam rales at left base but without evidence of decompensation. IV equivalent home meds  Current chronic use of systemic steroids Reviewed outpatient notes, appears PCP had started him on fludrocortisone 100 mcg and referred to pulmonology, who started daily low dose prednisone 5 mg daily - RN confirmed pt is on both, discussed w/ pharmacy and taking both is equivalent 55 mg prednisone per day Hydrocortisone is on very short supply, so placed on solumedrol 40 mg IV daily until can take po then would taper w/ prednisone  DVT prophylaxis: Eliquis --> SCD Code Status: FULL Family Communication: family at bedside on rounds Disposition Plan / TOC needs: likely ok for home after procedure pending other complications   Barriers to discharge / significant pending items: awaiting EGD              Objective: Vitals:   07/17/21 0113 07/17/21 0456 07/17/21 0822 07/17/21 1559  BP:  103/70 117/63 135/67  Pulse:  77 74 74  Resp:  '18 16 18  '$ Temp: 98.4 F (36.9 C) 98.3 F (36.8 C) 97.8 F (36.6 C) 97.8 F (36.6 C)  TempSrc: Oral Oral    SpO2:  91% 94% 92%  Weight:      Height:        Intake/Output Summary (Last 24 hours) at 07/17/2021 1613 Last data filed at 07/17/2021 0400 Gross per 24 hour  Intake 623.03 ml  Output --  Net 623.03 ml   Filed Weights   07/16/21 1414 07/17/21 0102  Weight: 103 kg 103 kg    Examination:  Constitutional:  VS as above General Appearance: alert, well-developed, well-nourished, NAD Ears, Nose, Mouth, Throat: Normal appearance Neck: No masses, trachea midline Respiratory: Normal respiratory effort Breath sounds normal Cardiovascular: S1/S2 normal, no murmur/rub/gallop auscultated No lower extremity edema Gastrointestinal: Nontender, no masses Musculoskeletal:  No clubbing/cyanosis of digits Neurological: No cranial nerve deficit on limited exam Psychiatric: Normal judgment/insight Normal mood and affect       Scheduled Medications:   dorzolamide-timolol  1 drop Right Eye BID   furosemide  40 mg Intravenous Daily   latanoprost  1 drop Both Eyes QHS   metoprolol tartrate  5 mg Intravenous Q12H   pantoprazole (PROTONIX) IV  40 mg Intravenous Q24H    Continuous Infusions:  dextrose 5 % and 0.45% NaCl 50 mL/hr at 07/17/21 0129   piperacillin-tazobactam (ZOSYN)  IV 3.375 g (07/17/21 0349)    PRN Medications:    Antimicrobials:  Anti-infectives (From admission, onward)    Start     Dose/Rate Route Frequency Ordered Stop   07/17/21 0430  piperacillin-tazobactam (ZOSYN) IVPB 3.375 g        3.375 g 12.5 mL/hr over 240 Minutes Intravenous Every 8 hours 07/16/21 2343     07/16/21 2345  piperacillin-tazobactam (ZOSYN) IVPB 3.375 g   Status:  Discontinued        3.375 g 100 mL/hr over 30 Minutes Intravenous Every 8 hours 07/16/21 2339 07/16/21 2341   07/16/21 2345  piperacillin-tazobactam (ZOSYN) IVPB 3.375 g  Status:  Discontinued        3.375 g 12.5 mL/hr over 240 Minutes Intravenous Every 8 hours 07/16/21 2342 07/16/21 2343   07/16/21 2045  piperacillin-tazobactam (ZOSYN) IVPB 3.375 g        3.375 g 100 mL/hr over 30 Minutes Intravenous  Once 07/16/21 2043 07/16/21 2135       Data Reviewed: I have personally reviewed following labs and imaging studies  CBC: Recent Labs  Lab 07/16/21 1416 07/17/21 0505  WBC 10.0 9.9  HGB 14.0 12.7*  HCT 45.6 40.8  MCV 98.9 96.7  PLT 148* 096*   Basic Metabolic Panel: Recent Labs  Lab 07/16/21 1416  NA 146*  K 4.2  CL 108  CO2 33*  GLUCOSE 120*  BUN 16  CREATININE 0.97  CALCIUM 8.5*   GFR: Estimated Creatinine Clearance: 67 mL/min (by C-G formula based on SCr of 0.97 mg/dL). Liver Function Tests: Recent Labs  Lab 07/16/21 1416  AST 14*  ALT 13  ALKPHOS 72  BILITOT 1.2  PROT 6.2*  ALBUMIN 3.2*   Recent Labs  Lab 07/16/21 1416  LIPASE 22   No results for input(s): "AMMONIA" in the last 168 hours. Coagulation Profile: No results for input(s): "INR", "PROTIME" in the last 168 hours. Cardiac Enzymes: No results for input(s): "CKTOTAL", "CKMB", "CKMBINDEX", "TROPONINI" in the last 168 hours. BNP (last 3 results) No results for input(s): "PROBNP" in the last 8760 hours. HbA1C: No results for input(s): "HGBA1C" in the last 72 hours. CBG: No results for input(s): "GLUCAP" in the last 168 hours. Lipid Profile: No results for input(s): "CHOL", "HDL", "LDLCALC", "TRIG", "CHOLHDL", "LDLDIRECT" in the last 72 hours. Thyroid Function Tests: No results for input(s): "TSH", "T4TOTAL", "FREET4", "T3FREE", "THYROIDAB" in the last 72 hours. Anemia Panel: No results for input(s): "VITAMINB12", "FOLATE", "FERRITIN", "TIBC", "IRON", "RETICCTPCT" in the last 72  hours. Urine analysis:    Component Value Date/Time   COLORURINE YELLOW (A) 07/16/2021 2341   APPEARANCEUR CLEAR (A) 07/16/2021 2341   APPEARANCEUR Clear 11/21/2017 1011   LABSPEC 1.043 (H) 07/16/2021 2341   LABSPEC 1.013 09/20/2012 0122   PHURINE 7.0 07/16/2021 2341   GLUCOSEU NEGATIVE 07/16/2021 2341   GLUCOSEU Negative 09/20/2012 0122   HGBUR NEGATIVE 07/16/2021 2341   BILIRUBINUR NEGATIVE 07/16/2021 2341   BILIRUBINUR Negative 11/21/2017 1011   BILIRUBINUR Negative 09/20/2012 0122   KETONESUR NEGATIVE 07/16/2021 2341   PROTEINUR NEGATIVE 07/16/2021 2341   NITRITE NEGATIVE 07/16/2021 2341   LEUKOCYTESUR NEGATIVE 07/16/2021 2341   LEUKOCYTESUR Negative 09/20/2012 0122   Sepsis Labs: '@LABRCNTIP'$ (procalcitonin:4,lacticidven:4)  No results found for this or any previous visit (from the past 240 hour(s)).       Radiology Studies last 96 hours: DG UGI W SINGLE CM (SOL OR THIN BA)  Result Date: 07/17/2021 CLINICAL DATA:  Nausea and vomiting for several months, with persistent vomiting x2 days. Aspiration pneumonia and coughing with drinking liquids. EXAM: DG UGI W SINGLE CM TECHNIQUE: Due to the patient's physical condition and severe nausea, single contrast examination was performed using thin barium. This exam was performed by Tsosie Billing PA-C, and was supervised and interpreted by Dr. Kris Hartmann. FLUOROSCOPY: Radiation Exposure Index (as provided by the fluoroscopic device): 79.70 mGy Kerma COMPARISON:  CT abdomen and pelvis with contrast 07/16/2021 FINDINGS: Exam is technically suboptimal due to patient's limited physical mobility, and inability to perform rapid bolus swallowing due to severe nausea. Esophagus: Posterior cervical spine fusion hardware noted. No evidence of vestibular penetration or aspiration during swallowing. The esophagus is nondilated. Esophageal stasis is seen due to severe dysmotility, as described below. Evaluation is limited by esophageal dysmotility, however  there is an apparent smooth stricture at the GE junction, which limits filling the stomach. Esophageal motility: Severe dysmotility is visualized throughout the entire esophagus, with lack of primary contraction and diffuse tertiary contractions seen. Gastroesophageal reflux:  None visualized. Ingested 23m barium tablet: Not given. Stomach: Limited evaluation due to inability to perform rapid bolus swallowing and episode of emesis. No evidence of hiatal hernia. The stomach is nondilated and there is no evidence of gastric outlet obstruction. Gastric emptying: Normal. Duodenum: Limited evaluation due to incomplete distention, however no definite abnormality identified. IMPRESSION: Technically limited exam due to patient immobility, inability to perform bolus swallowing, and nausea with episode of vomiting during the exam. No evidence of vestibular penetration or aspiration. Severe esophageal dysmotility. Stricture at the GE junction. Upper endoscopy is recommended for further evaluation. No evidence of hiatal hernia or gastroesophageal reflux. Limited evaluation of  stomach and duodenum. No evidence of gastric outlet obstruction. Electronically Signed   By: Marlaine Hind M.D.   On: 07/17/2021 12:34   CT ABDOMEN PELVIS W CONTRAST  Result Date: 07/16/2021 CLINICAL DATA:  Vomiting for 7 days. EXAM: CT ABDOMEN AND PELVIS WITH CONTRAST TECHNIQUE: Multidetector CT imaging of the abdomen and pelvis was performed using the standard protocol following bolus administration of intravenous contrast. RADIATION DOSE REDUCTION: This exam was performed according to the departmental dose-optimization program which includes automated exposure control, adjustment of the mA and/or kV according to patient size and/or use of iterative reconstruction technique. CONTRAST:  140m OMNIPAQUE IOHEXOL 300 MG/ML  SOLN COMPARISON:  September of 2022. FINDINGS: Lower chest: Patchy basilar airspace disease in the dependent LEFT and RIGHT chest  without effusion. Small pericardial effusion is smaller than on previous exams, heart is incompletely imaged. Hepatobiliary: Post cholecystectomy. Smooth contour of the liver. No focal, suspicious hepatic lesion. No biliary duct dilation. Portal vein is patent. Pancreas: Mild pancreatic atrophy without ductal dilation or inflammation. Spleen: Normal. Adrenals/Urinary Tract: Adrenal glands are normal. Symmetric renal enhancement without signs of hydronephrosis. Renal cortical scarring with 2 cm LEFT renal cyst for which no follow-up is recommended in the absence of localizing symptoms. No perivesical stranding. No hydronephrosis. Stomach/Bowel: No stranding adjacent to the stomach. No small bowel dilation or signs of small bowel inflammation. No dilation of the colon. No signs of colonic inflammation. Vascular/Lymphatic: Aortic atherosclerosis. No sign of aneurysm. Smooth contour of the IVC. There is no gastrohepatic or hepatoduodenal ligament lymphadenopathy. No retroperitoneal or mesenteric lymphadenopathy. No pelvic sidewall lymphadenopathy. Atherosclerotic changes are moderate with mix calcified and noncalcified plaque. Reproductive: Unremarkable by CT. Other: No ascites.  No pneumoperitoneum. Musculoskeletal: Signs of spinal fusion of the lower thoracic and upper lumbar spine, incompletely imaged on CT. Bilateral degenerative changes of the hips. Changes related to ankylosing spondylitis in the spine. IMPRESSION: 1. Patchy basilar airspace disease in the dependent LEFT and RIGHT chest without effusion, suspicious for mild aspiration change mixed with atelectasis. 2. No acute intra-abdominal or pelvic process. 3. Post cholecystectomy. 4. 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 5. Aortic atherosclerosis. Aortic Atherosclerosis (ICD10-I70.0). Electronically Signed   By: GZetta BillsM.D.   On: 07/16/2021 20:10   DG Chest 2  View  Result Date: 07/16/2021 CLINICAL DATA:  Cough, shortness of breath and vomiting. EXAM: CHEST - 2 VIEW COMPARISON:  February 24, 2021. FINDINGS: EKG leads project over the chest. Cardiomediastinal contours and hilar structures are stable. Mild LEFT lower lobe airspace disease is similar to prior imaging. No sign of pleural effusion. IMPRESSION: Mild LEFT lower lobe airspace disease is similar to prior imaging, likely scarring given lack of change. No new areas of consolidation or sign of pleural effusion. Electronically Signed   By: GZetta BillsM.D.   On: 07/16/2021 18:12            LOS: 1 day    Time spent: 35 min     NEmeterio Reeve DO [facility] 07/17/2021, 4:13 PM   Staff may message me via secure chat in [employer] but this may not receive immediate response,  please page for urgent matters!  If 7PM-7AM, please contact night-coverage [email or web address]  Dictation software was used to generate the above note. Typos may occur and escape review, as with typed/written notes. Please contact Dr ASheppard Coildirectly for clarity if needed.

## 2021-07-17 NOTE — Consult Note (Signed)
Inpatient Consultation   Patient ID: Chris Kent is a 86 y.o. male.  Requesting Provider: Adella Hare, MD  Date of Admission: 07/16/2021  Date of Consult: 07/17/21   Reason for Consultation: aspiration   Patient's Chief Complaint:   Chief Complaint  Patient presents with   Emesis    86 year old Caucasian male with history of PE on Eliquis (January 2023), HFpEF, CAD, HTN, HLD who presents for 1 week of post prandial vomiting. GI is consulted for this vomiting and secondary suspected aspiration.  Pt denies any overt symptoms of dysphagia or odynophagia. He sometimes gets "choked" on liquids if he drinks too fast. Pills and solids go down without issue. He has no abdominal pain or nausea. Pt reports the emesis occurs a few minutes after ingesting liquids- but not with solids. No associated nausea. Patient reports normal BM w/o melena or hematochezia. He denies hematemesis and coffee ground emesis. Appetite maintained and weight stable.  Notably has had multiple segments of his spine fused per records. He was also given his Eliquis around 1 am. Started on ppi by hospitalist service.  Being treated for aspiration PNA by hospitalist service. Wearing 2LNC now- typically wears 1LNC just at night. Presented with sat in high 80s/low 90s.  CT performed unremarkable for GI issues.  Denies NSAIDs, Anti-plt agents, and anticoagulants Denies family history of gastrointestinal disease and malignancy Previous Endoscopies: [facility] in 2013 and 2005- [facility] EGD- 2003- cricopharyngeal bar- dilated with 16 mm Savary; negative for HP EGD 08/2003- dilated with 73m Savary 10/2003 EGD and EUS- no signs of stricture; GEJ nodule suspected leiomyoma w/o any obstructive features. Wall layers normal   Past Medical History:  Diagnosis Date   Anxiety    Arthritis    Asthma    CHF (congestive heart failure) (HCC)    Coronary artery disease    GERD (gastroesophageal reflux disease)    Glaucoma     Glaucoma    History of stroke    Hyperlipidemia    Hyperlipidemia    Hypertension    [place]    Sleep apnea    [facility]     Past Surgical History:  Procedure Laterality Date   REPLACEMENT TOTAL KNEE BILATERAL      No Known Allergies  Family History  Problem Relation Age of Onset   Prostate cancer Father     Social History   Tobacco Use   Smoking status: Never   Smokeless tobacco: Never  Vaping Use   Vaping Use: Never used  Substance Use Topics   Alcohol use: No   Drug use: No     Pertinent GI related history and allergies were reviewed with the patient  Review of Systems  Constitutional:  Negative for activity change, appetite change, chills, diaphoresis, fatigue, fever and unexpected weight change.  HENT:  Negative for trouble swallowing and voice change.   Respiratory:  Positive for cough. Negative for shortness of breath and wheezing.   Cardiovascular:  Negative for chest pain, palpitations and leg swelling.  Gastrointestinal:  Positive for vomiting. Negative for abdominal distention, abdominal pain, anal bleeding, blood in stool, constipation, diarrhea and nausea.  Musculoskeletal:  Negative for arthralgias and myalgias.  Skin:  Negative for color change and pallor.  Neurological:  Negative for dizziness, syncope and weakness.  Psychiatric/Behavioral:  Negative for confusion. The patient is not nervous/anxious.   All other systems reviewed and are negative.    Medications Home Medications No current facility-administered medications on file  prior to encounter.   Current Outpatient Medications on File Prior to Encounter  Medication Sig Dispense Refill   apixaban (ELIQUIS) 5 MG TABS tablet Take two tablets twice a day for six days then one tablet twice a day afterwards 72 tablet 0   budesonide (PULMICORT) 0.5 MG/2ML nebulizer solution Take 2 mLs by nebulization as directed.     chlorpheniramine-HYDROcodone 10-8 MG/5ML Take 5 mLs  by mouth every 12 (twelve) hours.     donepezil (ARICEPT) 5 MG tablet Take 5 mg by mouth daily.     Dorzolamide HCl-Timolol Mal PF 22.3-6.8 MG/ML SOLN Place 1 drop into the right eye 2 (two) times daily.     fludrocortisone (FLORINEF) 0.1 MG tablet Take 100 mcg by mouth daily.     latanoprost (XALATAN) 0.005 % ophthalmic solution Place 1 drop into both eyes at bedtime.  0   potassium chloride SA (KLOR-CON M) 20 MEQ tablet Take 2 tablets (40 mEq total) by mouth daily. 30 tablet 0   predniSONE (DELTASONE) 5 MG tablet Take 5 mg by mouth daily.     rosuvastatin (CRESTOR) 10 MG tablet Take 10 mg by mouth at bedtime.     tamsulosin (FLOMAX) 0.4 MG CAPS capsule Take 2 capsules (0.8 mg total) by mouth daily after supper. 30 capsule    torsemide (DEMADEX) 20 MG tablet Take 2 tablets po daily in am 60 tablet 0   cetirizine (ZYRTEC) 10 MG tablet Take 10 mg by mouth at bedtime.  3   Cyanocobalamin (VITAMIN B 12) 500 MCG TABS Take 500 mcg by mouth daily. 30 tablet 0   ergocalciferol (VITAMIN D2) 1.25 MG (50000 UT) capsule Take 1 capsule (50,000 Units total) by mouth once a week. 4 capsule 5   ergocalciferol (VITAMIN D2) 1.25 MG (50000 UT) capsule Take 50,000 Units by mouth every 7 (seven) days.     metoprolol succinate (TOPROL-XL) 25 MG 24 hr tablet Take 12.5 mg by mouth daily.     midodrine (PROAMATINE) 10 MG tablet Take 10 mg by mouth 3 (three) times daily.     senna-docusate (SENOKOT-S) 8.6-50 MG tablet Take 2 tablets by mouth 2 (two) times daily. While you are on pain medication. (Patient not taking: Reported on 07/05/2021)     triamcinolone cream (KENALOG) 0.1 % Apply 1 application topically 3 (three) times daily. (Apply to rash on legs)     Pertinent GI related medications were reviewed with the patient  Inpatient Medications  Current Facility-Administered Medications:    acetaminophen (TYLENOL) tablet 650 mg, 650 mg, Oral, Q6H PRN **OR** acetaminophen (TYLENOL) suppository 650 mg, 650 mg, Rectal, Q6H  PRN, Norins, Heinz Knuckles, MD   apixaban (ELIQUIS) tablet 5 mg, 5 mg, Oral, BID, Norins, Heinz Knuckles, MD, 5 mg at 07/17/21 0133   dextrose 5 %-0.45 % sodium chloride infusion, , Intravenous, Continuous, Norins, Heinz Knuckles, MD, Last Rate: 50 mL/hr at 07/17/21 0129, New Bag at 07/17/21 0129   donepezil (ARICEPT) tablet 5 mg, 5 mg, Oral, Daily, Norins, Heinz Knuckles, MD   dorzolamide-timolol (COSOPT) 22.3-6.8 MG/ML ophthalmic solution 1 drop, 1 drop, Right Eye, BID, Norins, Heinz Knuckles, MD   fludrocortisone (FLORINEF) tablet 100 mcg, 100 mcg, Oral, Daily, Norins, Heinz Knuckles, MD   latanoprost (XALATAN) 0.005 % ophthalmic solution 1 drop, 1 drop, Both Eyes, QHS, Norins, Heinz Knuckles, MD   loratadine (CLARITIN) tablet 10 mg, 10 mg, Oral, Daily, Norins, Heinz Knuckles, MD   metoprolol succinate (TOPROL-XL) 24 hr tablet 12.5 mg, 12.5 mg, Oral, Daily,  Norins, Heinz Knuckles, MD   midodrine (PROAMATINE) tablet 10 mg, 10 mg, Oral, TID AC, Norins, Heinz Knuckles, MD   pantoprazole (PROTONIX) EC tablet 40 mg, 40 mg, Oral, Daily, Norins, Heinz Knuckles, MD   piperacillin-tazobactam (ZOSYN) IVPB 3.375 g, 3.375 g, Intravenous, Q8H, Belue, Alver Sorrow, RPH, Last Rate: 12.5 mL/hr at 07/17/21 0349, 3.375 g at 07/17/21 0349   potassium chloride SA (KLOR-CON M) CR tablet 40 mEq, 40 mEq, Oral, Daily, Norins, Heinz Knuckles, MD   predniSONE (DELTASONE) tablet 5 mg, 5 mg, Oral, Q breakfast, Norins, Heinz Knuckles, MD   rosuvastatin (CRESTOR) tablet 10 mg, 10 mg, Oral, QHS, Norins, Heinz Knuckles, MD, 10 mg at 07/17/21 0133   senna-docusate (Senokot-S) tablet 2 tablet, 2 tablet, Oral, BID PRN, Norins, Heinz Knuckles, MD   tamsulosin (FLOMAX) capsule 0.8 mg, 0.8 mg, Oral, QPC supper, Norins, Heinz Knuckles, MD   torsemide [facility]) tablet 40 mg, 40 mg, Oral, Daily, Norins, Michael E, MD  dextrose 5 % and 0.45% NaCl 50 mL/hr at 07/17/21 0129   piperacillin-tazobactam (ZOSYN)  IV 3.375 g (07/17/21 0349)    acetaminophen **OR** acetaminophen, senna-docusate   Objective   Vitals:    07/17/21 0030 07/17/21 0102 07/17/21 0113 07/17/21 0456  BP: 126/70   103/70  Pulse: 77   77  Resp: 17   18  Temp:   98.4 F (36.9 C) 98.3 F (36.8 C)  TempSrc:   Oral Oral  SpO2: 92%   91%  Weight:  103 kg    Height:  '5\' 9"'$  (1.753 m)      Physical Exam Vitals and nursing note reviewed.  Constitutional:      General: He is not in acute distress.    Appearance: He is obese. He is not ill-appearing, toxic-appearing or diaphoretic.  HENT:     Head: Normocephalic and atraumatic.     Nose: Nose normal.     Mouth/Throat:     Mouth: Mucous membranes are moist.     Pharynx: Oropharynx is clear. No oropharyngeal exudate.     Comments: Poor dentition Eyes:     General: No scleral icterus.    Extraocular Movements: Extraocular movements intact.  Cardiovascular:     Rate and Rhythm: Normal rate and regular rhythm.     Heart sounds: Normal heart sounds. No murmur heard.    No friction rub. No gallop.  Pulmonary:     Effort: No respiratory distress.     Breath sounds: No wheezing, rhonchi or rales.     Comments: Wearing 2LNC, coarse cough- productive, diminished breath sounds bilaterally Abdominal:     General: Bowel sounds are normal. There is no distension.     Palpations: Abdomen is soft.     Tenderness: There is no abdominal tenderness. There is no guarding or rebound.     Comments: protuberant  Musculoskeletal:     Cervical back: Neck supple.     Right lower leg: No edema.     Left lower leg: No edema.  Skin:    General: Skin is warm and dry.     Coloration: Skin is not jaundiced or pale.  Neurological:     General: No focal deficit present.     Mental Status: He is alert and oriented to person, place, and time. Mental status is at baseline.  Psychiatric:        Mood and Affect: Mood normal.        Behavior: Behavior normal.        Thought  Content: Thought content normal.        Judgment: Judgment normal.     Laboratory Data Recent Labs  Lab 07/16/21 1416  07/17/21 0505  WBC 10.0 9.9  HGB 14.0 12.7*  HCT 45.6 40.8  PLT 148* 145*   Recent Labs  Lab 07/16/21 1416  NA 146*  K 4.2  CL 108  CO2 33*  BUN 16  CALCIUM 8.5*  PROT 6.2*  BILITOT 1.2  ALKPHOS 72  ALT 13  AST 14*  GLUCOSE 120*   No results for input(s): "INR" in the last 168 hours.  Recent Labs    07/16/21 1416  LIPASE 22        Imaging Studies: CT ABDOMEN PELVIS W CONTRAST  Result Date: 07/16/2021 CLINICAL DATA:  Vomiting for 7 days. EXAM: CT ABDOMEN AND PELVIS WITH CONTRAST TECHNIQUE: Multidetector CT imaging of the abdomen and pelvis was performed using the standard protocol following bolus administration of intravenous contrast. RADIATION DOSE REDUCTION: This exam was performed according to the departmental dose-optimization program which includes automated exposure control, adjustment of the mA and/or kV according to patient size and/or use of iterative reconstruction technique. CONTRAST:  136m OMNIPAQUE IOHEXOL 300 MG/ML  SOLN COMPARISON:  September of 2022. FINDINGS: Lower chest: Patchy basilar airspace disease in the dependent LEFT and RIGHT chest without effusion. Small pericardial effusion is smaller than on previous exams, heart is incompletely imaged. Hepatobiliary: Post cholecystectomy. Smooth contour of the liver. No focal, suspicious hepatic lesion. No biliary duct dilation. Portal vein is patent. Pancreas: Mild pancreatic atrophy without ductal dilation or inflammation. Spleen: Normal. Adrenals/Urinary Tract: Adrenal glands are normal. Symmetric renal enhancement without signs of hydronephrosis. Renal cortical scarring with 2 cm LEFT renal cyst for which no follow-up is recommended in the absence of localizing symptoms. No perivesical stranding. No hydronephrosis. Stomach/Bowel: No stranding adjacent to the stomach. No small bowel dilation or signs of small bowel inflammation. No dilation of the colon. No signs of colonic inflammation. Vascular/Lymphatic: Aortic  atherosclerosis. No sign of aneurysm. Smooth contour of the IVC. There is no gastrohepatic or hepatoduodenal ligament lymphadenopathy. No retroperitoneal or mesenteric lymphadenopathy. No pelvic sidewall lymphadenopathy. Atherosclerotic changes are moderate with mix calcified and noncalcified plaque. Reproductive: Unremarkable by CT. Other: No ascites.  No pneumoperitoneum. Musculoskeletal: Signs of spinal fusion of the lower thoracic and upper lumbar spine, incompletely imaged on CT. Bilateral degenerative changes of the hips. Changes related to ankylosing spondylitis in the spine. IMPRESSION: 1. Patchy basilar airspace disease in the dependent LEFT and RIGHT chest without effusion, suspicious for mild aspiration change mixed with atelectasis. 2. No acute intra-abdominal or pelvic process. 3. Post cholecystectomy. 4. 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 5. Aortic atherosclerosis. Aortic Atherosclerosis (ICD10-I70.0). Electronically Signed   By: GZetta BillsM.D.   On: 07/16/2021 20:10   DG Chest 2 View  Result Date: 07/16/2021 CLINICAL DATA:  Cough, shortness of breath and vomiting. EXAM: CHEST - 2 VIEW COMPARISON:  February 24, 2021. FINDINGS: EKG leads project over the chest. Cardiomediastinal contours and hilar structures are stable. Mild LEFT lower lobe airspace disease is similar to prior imaging. No sign of pleural effusion. IMPRESSION: Mild LEFT lower lobe airspace disease is similar to prior imaging, likely scarring given lack of change. No new areas of consolidation or sign of pleural effusion. Electronically Signed   By: GZetta BillsM.D.   On: 07/16/2021 18:12    Assessment:   #  Emesis for 1 week - no associated nausea - happens after drinking fluid (within about 30 minutes), however, no issues with solids or pills - no true dysphagia/odynophagia in d/w patient - has h/o cricopharyngeal bar- EGD in  2003/2005 empirically dilated. He reports this does not feel like that - repeat EGD in 2005 did not note any stricture or abnormality. Recommended motility manometry- does not appear that this was ever done by records - Gastroparesis is also of consideration given his sx  # Aspiration Pna- being treated for - increase in O2 requirement to 2L  # PE on eliquis - rcd eliquis this morning (07/17/21) # HFpEF # OSA # Spinal Fusion # HLD #HTN  Plan:  Since patient has received eliquis today, endoscopy would not be able to be performed. This would have to be held for 2 days prior to any scope or dilation Will perform esophagram/ugi study to rule out obstruction- pending Currently NPO for study Pending results, can follow up as outpatient egd/dilation if indicated so eliquis can be held. Can also consider motility/manometry as outpatient pending study findings This would also allow him to recover from his current pna Agree with speech therapy evaluation as patient does not have true esophageal dysphagia/odynophagia at this time Prn anti emetics as per primary team Labs and imaging reviewed. Pt is on steroids, so consideration for candidal esophagitis- but no signs of in oropharynx - can try small frequent meals to see if this improves sx as well (gastroparesis diet)  Was able to speak patient's daughter, June Parrish, on the phone who was amenable to the above plan. Will arrange for outpatient follow-up in the office pending UGI study.  I personally performed the service.  Management of other medical comorbidities as per primary team  Thank you for allowing [place] to participate in this patient's care. Please don't hesitate to call if any questions or concerns arise.   Annamaria Helling, DO [facility]  Portions of the record may have been created with voice recognition software. Occasional wrong-word or 'sound-a-like' substitutions may have occurred due to the  inherent limitations of voice recognition software.  Read the chart carefully and recognize, using context, where substitutions may have occurred.

## 2021-07-18 ENCOUNTER — Inpatient Hospital Stay: Payer: PPO

## 2021-07-18 DIAGNOSIS — Z7952 Long term (current) use of systemic steroids: Secondary | ICD-10-CM

## 2021-07-18 DIAGNOSIS — I5032 Chronic diastolic (congestive) heart failure: Secondary | ICD-10-CM | POA: Diagnosis not present

## 2021-07-18 DIAGNOSIS — J690 Pneumonitis due to inhalation of food and vomit: Secondary | ICD-10-CM

## 2021-07-18 DIAGNOSIS — K222 Esophageal obstruction: Secondary | ICD-10-CM | POA: Diagnosis not present

## 2021-07-18 LAB — BASIC METABOLIC PANEL
Anion gap: 6 (ref 5–15)
BUN: 15 mg/dL (ref 8–23)
CO2: 30 mmol/L (ref 22–32)
Calcium: 8.5 mg/dL — ABNORMAL LOW (ref 8.9–10.3)
Chloride: 107 mmol/L (ref 98–111)
Creatinine, Ser: 0.87 mg/dL (ref 0.61–1.24)
GFR, Estimated: >60 mL/min (ref >60)
Glucose, Bld: 160 mg/dL — ABNORMAL HIGH (ref 70–99)
Potassium: 3.5 mmol/L (ref 3.5–5.1)
Sodium: 143 mmol/L (ref 135–145)

## 2021-07-18 LAB — CBC
HCT: 43.3 % (ref 39.0–52.0)
Hemoglobin: 13.8 g/dL (ref 13.0–17.0)
MCH: 30.7 pg (ref 26.0–34.0)
MCHC: 31.9 g/dL (ref 30.0–36.0)
MCV: 96.4 fL (ref 80.0–100.0)
Platelets: 144 10*3/uL — ABNORMAL LOW (ref 150–400)
RBC: 4.49 MIL/uL (ref 4.22–5.81)
RDW: 14 % (ref 11.5–15.5)
WBC: 8.7 10*3/uL (ref 4.0–10.5)
nRBC: 0 % (ref 0.0–0.2)

## 2021-07-18 LAB — PROCALCITONIN: Procalcitonin: <0.1 ng/mL

## 2021-07-18 MED ORDER — STERILE WATER FOR INJECTION IJ SOLN
INTRAMUSCULAR | Status: AC
  Filled 2021-07-18: qty 10

## 2021-07-18 MED ORDER — POTASSIUM CHLORIDE 20 MEQ PO PACK
40.0000 meq | PACK | Freq: Once | ORAL | Status: AC
  Administered 2021-07-18: 40 meq via ORAL
  Filled 2021-07-18: qty 2

## 2021-07-18 NOTE — Progress Notes (Addendum)
  Progress Note   Patient: Chris Kent ZMO:[id number] DOB: May 24, 1935 DOA: 07/16/2021     2 DOS: the patient was seen and examined on 07/18/2021   Brief hospital course: [name], an 86 y/o, with h/o CAD, HFpEF, h/o PE on Eliquis, h/o esophagitis with remote dilation of esophageal stricture at [place] many years ago, HTN. To ED 07/16/21 w/ c/o 1 week intermittent emesis immediately after he eats. Chest x-ray showed possible aspiration.  CT abdomen/pelvis not show significant abnormality. [facility] GI on 6/13 showed severe esophageal dysmotility.  Stricture at the GE junction.  Patient has been eval by GI, planning for EGD on 6/15 due to recent Eliquis use.   Assessment and Plan:  Esophageal stricture Severe esophageal dysmotility Emesis Esophagitis Aspiration pneumonia  Patient has been evaluated by speech therapy, mild aspiration risk.  Currently on clear liquid diet pending EGD. Continue symptomatic treatment. Patient has Evidence of aspiration pneumonitis on chest x-ray, but procalcitonin level is less than 0.1, this is mainly a chemical pneumonia.  No antibiotics needed.  Essential hypertension. Blood pressure stable.  Chronic diastolic congestive heart failure Currently no evidence of volume overload.  Patient currently on lower dose IV fluids.  I will discontinue IV fluids as patient is placed on liquid diet.  Patient also on Lasix.  Chronic steroid use. Currently on IV steroids.  Hypokalemia. Potassium 3.5 today, will give 40 mEq oral potassium.     Subjective:  Patient currently denies any short of breath.  Tolerating liquid diet without nausea vomiting  Physical Exam: Vitals:   07/17/21 1559 07/17/21 2049 07/18/21 0511 07/18/21 0813  BP: 135/67 129/67 117/73 (!) 115/57  Pulse: 74 68 69 72  Resp: '18 18 17 18  '$ Temp: 97.8 F (36.6 C) 98.1 F (36.7 C) 98.1 F (36.7 C)   TempSrc:    Oral  SpO2: 92% 95% 93% 95%  Weight:      Height:       General exam: Appears calm  and comfortable  Respiratory system: Clear to auscultation. Respiratory effort normal. Cardiovascular system: S1 & S2 heard, RRR. No JVD, murmurs, rubs, gallops or clicks. No pedal edema. Gastrointestinal system: Abdomen is nondistended, soft and nontender. No organomegaly or masses felt. Normal bowel sounds heard. Central nervous system: Alert and oriented. No focal neurological deficits. Extremities: Symmetric 5 x 5 power. Skin: No rashes, lesions or ulcers Psychiatry: Judgement and insight appear normal. Mood & affect appropriate.   Data Reviewed:  Chest x-ray, upper GI, CT results reviewed. All lab results reviewed.  Family Communication: not able to reach daughter  Disposition: Status is: Inpatient Remains inpatient appropriate because: Severity of disease, pending inpatient procedure.  Planned Discharge Destination: Home with Home Health    Time spent: 35 minutes  Author: Sharen Hones, MD 07/18/2021 11:05 AM  For on call review [email or web address].

## 2021-07-18 NOTE — Hospital Course (Addendum)
[name], an 86 y/o, with h/o CAD, HFpEF, h/o PE on Eliquis, h/o esophagitis with remote dilation of esophageal stricture at [place] many years ago, HTN. To ED 07/16/21 w/ c/o 1 week intermittent emesis immediately after he eats. Chest x-ray showed possible aspiration.  CT abdomen/pelvis not show significant abnormality. [facility] GI on 6/13 showed severe esophageal dysmotility.  Stricture at the GE junction.  Patient has been eval by GI, planning for EGD on 6/15 due to recent Eliquis use.  Patient had a EGD performed on 6/15, showed a severe esophageal dysmotility, no structural obstruction.  Had esophageal dilation during the procedure.

## 2021-07-18 NOTE — Progress Notes (Signed)
Inpatient Follow-up/Progress Note   Patient ID: Chris Kent is a 86 y.o. male.  Overnight Events / Subjective Findings Patient resting in bed comfortably.  Spoke with him regarding his esophagram and upper GI findings.  Although, he feels his swallowing is a little bit better today, but only having clear liquid diet.  He did have difficulty with the barium yesterday.  He has no abdominal pain nausea today. Feels his breathing is improved today Plan for EGD tomorrow.  Review of Systems  Constitutional:  Negative for activity change, appetite change, chills, diaphoresis, fatigue, fever and unexpected weight change.  HENT:  Positive for trouble swallowing. Negative for voice change.   Respiratory:  Negative for shortness of breath and wheezing.   Cardiovascular:  Negative for chest pain, palpitations and leg swelling.  Gastrointestinal:  Positive for nausea (improved) and vomiting (improved). Negative for abdominal distention, abdominal pain, anal bleeding, blood in stool, constipation and diarrhea.  Musculoskeletal:  Negative for arthralgias and myalgias.  Skin:  Negative for color change and pallor.  Neurological:  Negative for dizziness, syncope and weakness.  Psychiatric/Behavioral:  Negative for confusion. The patient is not nervous/anxious.   All other systems reviewed and are negative.    Medications  Current Facility-Administered Medications:    dextrose 5 %-0.45 % sodium chloride infusion, , Intravenous, Continuous, Norins, Heinz Knuckles, MD, Last Rate: 50 mL/hr at 07/18/21 0850, Restarted at 07/18/21 0850   dorzolamide-timolol (COSOPT) 22.3-6.8 MG/ML ophthalmic solution 1 drop, 1 drop, Right Eye, BID, Norins, Heinz Knuckles, MD, 1 drop at 07/18/21 0906   furosemide (LASIX) injection 40 mg, 40 mg, Intravenous, Daily, Emeterio Reeve, DO, 40 mg at 07/18/21 0902   latanoprost (XALATAN) 0.005 % ophthalmic solution 1 drop, 1 drop, Both Eyes, QHS, Norins, Heinz Knuckles, MD, 1 drop at  07/17/21 2028   methylPREDNISolone sodium succinate (SOLU-MEDROL) 40 mg/mL injection 40 mg, 40 mg, Intravenous, Daily, Emeterio Reeve, DO, 40 mg at 07/18/21 0902   metoprolol tartrate (LOPRESSOR) injection 5 mg, 5 mg, Intravenous, Q12H, Emeterio Reeve, DO, 5 mg at 07/17/21 2100   pantoprazole (PROTONIX) injection 40 mg, 40 mg, Intravenous, Q24H, Emeterio Reeve, DO, 40 mg at 07/17/21 1635  dextrose 5 % and 0.45% NaCl 50 mL/hr at 07/18/21 0850       Objective    Vitals:   07/17/21 1559 07/17/21 2049 07/18/21 0511 07/18/21 0813  BP: 135/67 129/67 117/73 (!) 115/57  Pulse: 74 68 69 72  Resp: '18 18 17 18  '$ Temp: 97.8 F (36.6 C) 98.1 F (36.7 C) 98.1 F (36.7 C)   TempSrc:    Oral  SpO2: 92% 95% 93% 95%  Weight:      Height:         Physical Exam Vitals and nursing note reviewed.  Constitutional:      General: He is not in acute distress.    Appearance: Normal appearance. He is not ill-appearing, toxic-appearing or diaphoretic.  HENT:     Head: Normocephalic and atraumatic.     Nose: Nose normal.     Mouth/Throat:     Mouth: Mucous membranes are moist.     Pharynx: Oropharynx is clear.     Comments: Poor dentition Eyes:     General: No scleral icterus.    Extraocular Movements: Extraocular movements intact.  Cardiovascular:     Rate and Rhythm: Normal rate and regular rhythm.     Heart sounds: Normal heart sounds. No murmur heard.    No friction rub. No gallop.  Pulmonary:     Effort: Pulmonary effort is normal. No respiratory distress.     Breath sounds: Normal breath sounds. No wheezing, rhonchi or rales.  Abdominal:     General: Bowel sounds are normal. There is no distension.     Palpations: Abdomen is soft.     Tenderness: There is no abdominal tenderness. There is no guarding or rebound.     Comments: protuberant  Musculoskeletal:     Cervical back: Neck supple.     Right lower leg: No edema.     Left lower leg: No edema.  Skin:    General: Skin  is warm and dry.     Coloration: Skin is not jaundiced or pale.  Neurological:     General: No focal deficit present.     Mental Status: He is alert and oriented to person, place, and time. Mental status is at baseline.  Psychiatric:        Mood and Affect: Mood normal.        Behavior: Behavior normal.        Thought Content: Thought content normal.        Judgment: Judgment normal.      Laboratory Data Recent Labs  Lab 07/16/21 1416 07/17/21 0505 07/18/21 0543  WBC 10.0 9.9 8.7  HGB 14.0 12.7* 13.8  HCT 45.6 40.8 43.3  PLT 148* 145* 144*   Recent Labs  Lab 07/16/21 1416 07/18/21 0543  NA 146* 143  K 4.2 3.5  CL 108 107  CO2 33* 30  BUN 16 15  CREATININE 0.97 0.87  CALCIUM 8.5* 8.5*  PROT 6.2*  --   BILITOT 1.2  --   ALKPHOS 72  --   ALT 13  --   AST 14*  --   GLUCOSE 120* 160*   No results for input(s): "INR" in the last 168 hours.    Imaging Studies: X-ray chest PA and lateral  Result Date: 07/18/2021 CLINICAL DATA:  86 year old male with esophageal dysmotility. Bibasilar lung opacity on recent CT Abdomen and Pelvis. EXAM: CHEST - 2 VIEW COMPARISON:  Chest radiographs and CT Abdomen and Pelvis 07/16/2021 and earlier. FINDINGS: Seated AP and lateral views of the chest at 0640 hours. Stable cardiomegaly and mediastinal contours. Mildly lower lung volumes. Visualized tracheal air column is within normal limits. No pneumothorax, pulmonary edema or definite pleural effusion. Patchy posterior costophrenic angle opacity appears unchanged from the recent CT. Underlying spinal ankylosis and posterior spinal fusion hardware. No acute osseous abnormality identified. Paucity of bowel gas in the upper abdomen. IMPRESSION: 1. Unchanged nonspecific patchy posterior costophrenic angle opacity since 07/16/2021. 2. No new cardiopulmonary abnormality. 3. Cardiomegaly.  Spinal ankylosis and posterior fusion hardware. Electronically Signed   By: Genevie Ann M.D.   On: 07/18/2021 06:59    DG UGI W SINGLE CM (SOL OR THIN BA)  Result Date: 07/17/2021 CLINICAL DATA:  Nausea and vomiting for several months, with persistent vomiting x2 days. Aspiration pneumonia and coughing with drinking liquids. EXAM: DG UGI W SINGLE CM TECHNIQUE: Due to the patient's physical condition and severe nausea, single contrast examination was performed using thin barium. This exam was performed by Tsosie Billing PA-C, and was supervised and interpreted by Dr. Kris Hartmann. FLUOROSCOPY: Radiation Exposure Index (as provided by the fluoroscopic device): 79.70 mGy Kerma COMPARISON:  CT abdomen and pelvis with contrast 07/16/2021 FINDINGS: Exam is technically suboptimal due to patient's limited physical mobility, and inability to perform rapid bolus swallowing due to severe nausea. Esophagus:  Posterior cervical spine fusion hardware noted. No evidence of vestibular penetration or aspiration during swallowing. The esophagus is nondilated. Esophageal stasis is seen due to severe dysmotility, as described below. Evaluation is limited by esophageal dysmotility, however there is an apparent smooth stricture at the GE junction, which limits filling the stomach. Esophageal motility: Severe dysmotility is visualized throughout the entire esophagus, with lack of primary contraction and diffuse tertiary contractions seen. Gastroesophageal reflux:  None visualized. Ingested 49m barium tablet: Not given. Stomach: Limited evaluation due to inability to perform rapid bolus swallowing and episode of emesis. No evidence of hiatal hernia. The stomach is nondilated and there is no evidence of gastric outlet obstruction. Gastric emptying: Normal. Duodenum: Limited evaluation due to incomplete distention, however no definite abnormality identified. IMPRESSION: Technically limited exam due to patient immobility, inability to perform bolus swallowing, and nausea with episode of vomiting during the exam. No evidence of vestibular penetration or  aspiration. Severe esophageal dysmotility. Stricture at the GE junction. Upper endoscopy is recommended for further evaluation. No evidence of hiatal hernia or gastroesophageal reflux. Limited evaluation of stomach and duodenum. No evidence of gastric outlet obstruction. Electronically Signed   By: JMarlaine HindM.D.   On: 07/17/2021 12:34   CT ABDOMEN PELVIS W CONTRAST  Result Date: 07/16/2021 CLINICAL DATA:  Vomiting for 7 days. EXAM: CT ABDOMEN AND PELVIS WITH CONTRAST TECHNIQUE: Multidetector CT imaging of the abdomen and pelvis was performed using the standard protocol following bolus administration of intravenous contrast. RADIATION DOSE REDUCTION: This exam was performed according to the departmental dose-optimization program which includes automated exposure control, adjustment of the mA and/or kV according to patient size and/or use of iterative reconstruction technique. CONTRAST:  1055mOMNIPAQUE IOHEXOL 300 MG/ML  SOLN COMPARISON:  September of 2022. FINDINGS: Lower chest: Patchy basilar airspace disease in the dependent LEFT and RIGHT chest without effusion. Small pericardial effusion is smaller than on previous exams, heart is incompletely imaged. Hepatobiliary: Post cholecystectomy. Smooth contour of the liver. No focal, suspicious hepatic lesion. No biliary duct dilation. Portal vein is patent. Pancreas: Mild pancreatic atrophy without ductal dilation or inflammation. Spleen: Normal. Adrenals/Urinary Tract: Adrenal glands are normal. Symmetric renal enhancement without signs of hydronephrosis. Renal cortical scarring with 2 cm LEFT renal cyst for which no follow-up is recommended in the absence of localizing symptoms. No perivesical stranding. No hydronephrosis. Stomach/Bowel: No stranding adjacent to the stomach. No small bowel dilation or signs of small bowel inflammation. No dilation of the colon. No signs of colonic inflammation. Vascular/Lymphatic: Aortic atherosclerosis. No sign of aneurysm.  Smooth contour of the IVC. There is no gastrohepatic or hepatoduodenal ligament lymphadenopathy. No retroperitoneal or mesenteric lymphadenopathy. No pelvic sidewall lymphadenopathy. Atherosclerotic changes are moderate with mix calcified and noncalcified plaque. Reproductive: Unremarkable by CT. Other: No ascites.  No pneumoperitoneum. Musculoskeletal: Signs of spinal fusion of the lower thoracic and upper lumbar spine, incompletely imaged on CT. Bilateral degenerative changes of the hips. Changes related to ankylosing spondylitis in the spine. IMPRESSION: 1. Patchy basilar airspace disease in the dependent LEFT and RIGHT chest without effusion, suspicious for mild aspiration change mixed with atelectasis. 2. No acute intra-abdominal or pelvic process. 3. Post cholecystectomy. 4. 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 5. Aortic atherosclerosis. Aortic Atherosclerosis (ICD10-I70.0). Electronically Signed   By: GeZetta Bills.D.   On: 07/16/2021 20:10   DG Chest 2 View  Result Date: 07/16/2021 CLINICAL DATA:  Cough, shortness of  breath and vomiting. EXAM: CHEST - 2 VIEW COMPARISON:  February 24, 2021. FINDINGS: EKG leads project over the chest. Cardiomediastinal contours and hilar structures are stable. Mild LEFT lower lobe airspace disease is similar to prior imaging. No sign of pleural effusion. IMPRESSION: Mild LEFT lower lobe airspace disease is similar to prior imaging, likely scarring given lack of change. No new areas of consolidation or sign of pleural effusion. Electronically Signed   By: Zetta Bills M.D.   On: 07/16/2021 18:12    Assessment:   # Dysphagia -UGI shows severe dysmotility. Possible tapering consistent with stricture/stenosis at the GEJ  # Emesis for 1 week - no associated nausea - happens after drinking fluid (within about 30 minutes), however, no issues with solids or pills - has h/o  cricopharyngeal bar- EGD in 2003/2005 empirically dilated. He reports this does not feel like that - repeat EGD in 2005 did not note any stricture or abnormality. Recommended motility manometry- does not appear that this was ever done by records - Gastroparesis is also of consideration given his sx   # Aspiration Pna- being treated for - improving from respiratory perspective   # PE on eliquis - rcd eliquis this morning (07/17/21) # HFpEF # OSA # Spinal Fusion # HLD #HTN  Plan:  Tolerating clear liquids. NPO at midnight Plan for EGD tomorrow with possible dilation (48 hours removed from eliquis) Eliquis currently being held UGI series reviewed with the patient Once dilated, will likely start ppi if not already taking Speech pathology has seen Will likely need manometry/motility study as outpatient Prn anti emetics as per primary team  Esophagogastroduodenoscopy with possible biopsy, control of bleeding, polypectomy, and interventions as necessary has been discussed with the patient/patient representative. Informed consent was obtained from the patient/patient representative after explaining the indication, nature, and risks of the procedure including but not limited to death, bleeding, perforation, missed neoplasm/lesions, cardiorespiratory compromise, and reaction to medications. Opportunity for questions was given and appropriate answers were provided. Patient/patient representative has verbalized understanding is amenable to undergoing the procedure.   I personally performed the service.  Management of other medical comorbidities as per primary team  Thank you for allowing [place] to participate in this patient's care. Please don't hesitate to call if any questions or concerns arise.   Annamaria Helling, DO [facility]  Portions of the record may have been created with voice recognition software. Occasional wrong-word or 'sound-a-like' substitutions may have  occurred due to the inherent limitations of voice recognition software.  Read the chart carefully and recognize, using context, where substitutions may have occurred.

## 2021-07-19 ENCOUNTER — Inpatient Hospital Stay: Payer: PPO | Admitting: Anesthesiology

## 2021-07-19 ENCOUNTER — Encounter
Admission: EM | Disposition: A | Discharge status: Home / Self Care | Payer: Self-pay | Source: Home / Self Care | Attending: Internal Medicine

## 2021-07-19 ENCOUNTER — Encounter: Payer: Self-pay | Admitting: Internal Medicine

## 2021-07-19 DIAGNOSIS — J690 Pneumonitis due to inhalation of food and vomit: Secondary | ICD-10-CM | POA: Diagnosis not present

## 2021-07-19 DIAGNOSIS — K222 Esophageal obstruction: Secondary | ICD-10-CM | POA: Diagnosis not present

## 2021-07-19 DIAGNOSIS — Z7952 Long term (current) use of systemic steroids: Secondary | ICD-10-CM | POA: Diagnosis not present

## 2021-07-19 HISTORY — PX: ESOPHAGOGASTRODUODENOSCOPY (EGD) WITH PROPOFOL: SHX5813

## 2021-07-19 LAB — MAGNESIUM: Magnesium: 2.2 mg/dL (ref 1.7–2.4)

## 2021-07-19 LAB — BASIC METABOLIC PANEL
Anion gap: 6 (ref 5–15)
BUN: 16 mg/dL (ref 8–23)
CO2: 32 mmol/L (ref 22–32)
Calcium: 8.7 mg/dL — ABNORMAL LOW (ref 8.9–10.3)
Chloride: 105 mmol/L (ref 98–111)
Creatinine, Ser: 0.95 mg/dL (ref 0.61–1.24)
GFR, Estimated: >60 mL/min (ref >60)
Glucose, Bld: 102 mg/dL — ABNORMAL HIGH (ref 70–99)
Potassium: 3.5 mmol/L (ref 3.5–5.1)
Sodium: 143 mmol/L (ref 135–145)

## 2021-07-19 LAB — CBC
HCT: 41.9 % (ref 39.0–52.0)
Hemoglobin: 13.6 g/dL (ref 13.0–17.0)
MCH: 31 pg (ref 26.0–34.0)
MCHC: 32.5 g/dL (ref 30.0–36.0)
MCV: 95.4 fL (ref 80.0–100.0)
Platelets: 167 K/uL (ref 150–400)
RBC: 4.39 MIL/uL (ref 4.22–5.81)
RDW: 13.8 % (ref 11.5–15.5)
WBC: 14.1 K/uL — ABNORMAL HIGH (ref 4.0–10.5)
nRBC: 0 % (ref 0.0–0.2)

## 2021-07-19 SURGERY — ESOPHAGOGASTRODUODENOSCOPY (EGD) WITH PROPOFOL
Anesthesia: General

## 2021-07-19 MED ORDER — SODIUM CHLORIDE 0.9 % IV SOLN: INTRAVENOUS | Status: DC

## 2021-07-19 MED ORDER — PREDNISONE 20 MG PO TABS
20.0000 mg | ORAL_TABLET | Freq: Every day | ORAL | Status: DC
  Administered 2021-07-20: 20 mg via ORAL
  Filled 2021-07-19: qty 1

## 2021-07-19 MED ORDER — LIDOCAINE HCL (CARDIAC) PF 100 MG/5ML IV SOSY
PREFILLED_SYRINGE | INTRAVENOUS | Status: DC | PRN
  Administered 2021-07-19: 80 mg via INTRAVENOUS

## 2021-07-19 MED ORDER — SUCRALFATE 1 GM/10ML PO SUSP
1.0000 g | Freq: Three times a day (TID) | ORAL | Status: DC
  Administered 2021-07-19 – 2021-07-20 (×4): 1 g via ORAL
  Filled 2021-07-19 (×4): qty 10

## 2021-07-19 MED ORDER — PROPOFOL 500 MG/50ML IV EMUL
INTRAVENOUS | Status: DC | PRN
  Administered 2021-07-19: 25 ug/kg/min via INTRAVENOUS

## 2021-07-19 MED ORDER — PROPOFOL 10 MG/ML IV BOLUS
INTRAVENOUS | Status: DC | PRN
  Administered 2021-07-19: 10 mg via INTRAVENOUS
  Administered 2021-07-19 (×2): 20 mg via INTRAVENOUS

## 2021-07-19 MED ORDER — POTASSIUM CHLORIDE 20 MEQ PO PACK
40.0000 meq | PACK | Freq: Once | ORAL | Status: AC
  Administered 2021-07-19: 40 meq via ORAL
  Filled 2021-07-19: qty 2

## 2021-07-19 NOTE — Progress Notes (Signed)
  Progress Note   Patient: Chris Kent:[id number] DOB: [birth year] DOA: 07/16/2021     3 DOS: the patient was seen and examined on 07/19/2021   Brief hospital course: [name], an 86 y/o, with h/o CAD, HFpEF, h/o PE on Eliquis, h/o esophagitis with remote dilation of esophageal stricture at [place] many years ago, HTN. To ED 07/16/21 w/ c/o 1 week intermittent emesis immediately after he eats. Chest x-ray showed possible aspiration.  CT abdomen/pelvis not show significant abnormality. [facility] GI on 6/13 showed severe esophageal dysmotility.  Stricture at the GE junction.  Patient has been eval by GI, planning for EGD on 6/15 due to recent Eliquis use.  Patient had a EGD performed on 6/15, showed a severe esophageal dysmotility, no structural obstruction.  Had esophageal dilation during the procedure.  Assessment and Plan: Esophageal stricture Severe esophageal dysmotility Emesis Esophagitis Aspiration pneumonia  Patient is status post EGD and esophageal dilation, still feels significant dysphagia today.  We will keep patient for another day to make sure patient can start swallowing. Continue PPI, also added sucralfate liquid.  Essential hypertension. Continue current treatment  Chronic diastolic congestive heart failure No evidence of volume overload   Chronic steroid use. I reviewed patient chart, I did not see any indication for steroids.  I will wean it down to 20 mg prednisone.   Hypokalemia. Additional oral potassium given      Subjective:  Patient is status post EGD and esophageal dilation, still feels dysphagia, not able to swallow. No abdominal pain or nausea vomiting  Physical Exam: Vitals:   07/19/21 1055 07/19/21 1105 07/19/21 1115 07/19/21 1120  BP: 117/72 (!) 149/85 137/78 131/74  Kent: 61 (!) 58 (!) 54 (!) 56  Resp: '20 16 16 18  '$ Temp: (!) 96.8 F (36 C)   98.1 F (36.7 C)  TempSrc: Temporal   Oral  SpO2: 90% 94% 94% 95%  Weight:      Height:        General exam: Appears calm and comfortable  Respiratory system: Clear to auscultation. Respiratory effort normal. Cardiovascular system: S1 & S2 heard, RRR. No JVD, murmurs, rubs, gallops or clicks. No pedal edema. Gastrointestinal system: Abdomen is nondistended, soft and nontender. No organomegaly or masses felt. Normal bowel sounds heard. Central nervous system: Alert and oriented. No focal neurological deficits. Extremities: Symmetric 5 x 5 power. Skin: No rashes, lesions or ulcers Psychiatry: Judgement and insight appear normal. Mood & affect appropriate.   Data Reviewed:  EGD report and lab results reviewed  Family Communication: Daughter at the bedside   Disposition: Status is: Inpatient Remains inpatient appropriate because: Severity of disease  Planned Discharge Destination: Home with Home Health    Time spent: 35 minutes  Author: Sharen Hones, MD 07/19/2021 1:24 PM  For on call review [email or web address].

## 2021-07-19 NOTE — Op Note (Signed)
[facility] Patient Name: Chris Kent Procedure Date: 07/19/2021 10:35 AM MRN: [id number] Account #: [id number] Date of Birth: 05-02-1935 Admit Type: Inpatient Age: 86 Room: [facility] ENDO ROOM 1 Gender: Male Note Status: Finalized Instrument Name: Upper Endoscope [id number] Procedure:             Upper GI endoscopy Indications:           Dysphagia Providers:             Rueben Bash, DO Referring MD:          Leonie Douglas. Doy Hutching, MD (Referring MD) Medicines:             Monitored Anesthesia Care Complications:         No immediate complications. Estimated blood loss: None. Procedure:             Pre-Anesthesia Assessment:                        - Prior to the procedure, a History and Physical was                         performed, and patient medications and allergies were                         reviewed. The patient is competent. The risks and                         benefits of the procedure and the sedation options and                         risks were discussed with the patient. All questions                         were answered and informed consent was obtained.                         Patient identification and proposed procedure were                         verified by the physician, the nurse, the anesthetist                         and the technician in the endoscopy suite. Mental                         Status Examination: alert and oriented. Airway                         Examination: normal oropharyngeal airway and neck                         mobility. Respiratory Examination: clear to                         auscultation. CV Examination: RRR, no murmurs, no S3                         or S4. Prophylactic Antibiotics: The patient does not  require prophylactic antibiotics. Prior                         Anticoagulants: The patient has taken Eliquis                         (apixaban), last dose was 2 days  prior to procedure.                         ASA Grade Assessment: III - A patient with severe                         systemic disease. After reviewing the risks and                         benefits, the patient was deemed in satisfactory                         condition to undergo the procedure. The anesthesia                         plan was to use monitored anesthesia care (MAC).                         Immediately prior to administration of medications,                         the patient was re-assessed for adequacy to receive                         sedatives. The heart rate, respiratory rate, oxygen                         saturations, blood pressure, adequacy of pulmonary                         ventilation, and response to care were monitored                         throughout the procedure. The physical status of the                         patient was re-assessed after the procedure.                        After obtaining informed consent, the endoscope was                         passed under direct vision. Throughout the procedure,                         the patient's blood pressure, pulse, and oxygen                         saturations were monitored continuously. The Endoscope                         was introduced through the mouth, and advanced to the  second part of duodenum. The upper GI endoscopy was                         accomplished without difficulty. The patient tolerated                         the procedure well. Findings:      The duodenal bulb, first portion of the duodenum and second portion of       the duodenum were normal. Estimated blood loss: none.      Localized minimal inflammation characterized by erythema was found in       the gastric antrum. Estimated blood loss: none.      A small hiatal hernia was present. Estimated blood loss: none.      The exam of the stomach was otherwise normal.      The Z-line was regular.  Estimated blood loss: none.      Esophagogastric landmarks were identified: the gastroesophageal junction       was found at 40 cm from the incisors.      Abnormal motility was noted in the esophagus. The cricopharyngeus was       normal. There is spasticity of the esophageal body. The distal       esophagus/lower esophageal sphincter is open. Estimated blood loss:       none. The scope was withdrawn. Dilation was performed with a Maloney       dilator with no resistance at 52 Fr. Estimated blood loss: none.      Fluid was found in the upper third of the esophagus. pooled saliva       secondary to dysmotility Impression:            - Normal duodenal bulb, first portion of the duodenum                         and second portion of the duodenum.                        - Gastritis.                        - Small hiatal hernia.                        - Z-line regular.                        - Esophagogastric landmarks identified.                        - Abnormal esophageal motility. Dilated.                        - Fluid in the upper third of the esophagus.                        - No specimens collected. Recommendation:        - Return patient to hospital ward for ongoing care.                        - Advance diet as tolerated and as per speech  pathology recommendations.                        - Continue present medications.                        - Use Protonix (pantoprazole) 40 mg PO daily.                        - Resume Eliquis (apixaban) at prior dose tomorrow.                         Refer to managing physician for further adjustment of                         therapy.                        - Return to GI clinic at appointment to be scheduled.                        - The findings and recommendations were discussed with                         the patient's family.                        - The findings and recommendations were discussed with                          the patient.                        - Recommend outpatient manometry and motility study. Procedure Code(s):     --- Professional ---                        [phone], Esophagogastroduodenoscopy, flexible,                         transoral; diagnostic, including collection of                         specimen(s) by brushing or washing, when performed                         (separate procedure)                        43450, Dilation of esophagus, by unguided sound or                         bougie, single or multiple passes Diagnosis Code(s):     --- Professional ---                        K29.70, Gastritis, unspecified, without bleeding                        K44.9, Diaphragmatic hernia without obstruction or                         gangrene  K22.4, Dyskinesia of esophagus                        R13.10, Dysphagia, unspecified CPT copyright 2019 American Medical Association. All rights reserved. The codes documented in this report are preliminary and upon coder review may  be revised to meet current compliance requirements. Attending Participation:      I personally performed the entire procedure. Volney American, DO Annamaria Helling DO, DO 07/19/2021 10:55:34 AM This report has been signed electronically. Number of Addenda: 0 Note Initiated On: 07/19/2021 10:35 AM Estimated Blood Loss:  Estimated blood loss: none.      [facility]

## 2021-07-19 NOTE — Interval H&P Note (Signed)
History and Physical Interval Note: Progress Note from 07/19/21  was reviewed and there was no interval change after seeing and examining the patient.  Written consent was obtained from the patient after discussion of risks, benefits, and alternatives. Patient has consented to proceed with Esophagogastroduodenoscopy with possible intervention   07/19/2021 10:32 AM  Chris Kent  has presented today for surgery, with the diagnosis of dysphagia, esophgeal stricture.  The various methods of treatment have been discussed with the patient and family. After consideration of risks, benefits and other options for treatment, the patient has consented to  Procedure(s): ESOPHAGOGASTRODUODENOSCOPY (EGD) WITH PROPOFOL (N/A) as a surgical intervention.  The patient's history has been reviewed, patient examined, no change in status, stable for surgery.  I have reviewed the patient's chart and labs.  Questions were answered to the patient's satisfaction.     Annamaria Helling

## 2021-07-19 NOTE — Transfer of Care (Signed)
Immediate Anesthesia Transfer of Care Note  Patient: Chris Kent  Procedure(s) Performed: ESOPHAGOGASTRODUODENOSCOPY (EGD) WITH PROPOFOL  Patient Location: PACU  Anesthesia Type:General  Level of Consciousness: sedated  Airway & Oxygen Therapy: Patient Spontanous Breathing and Patient connected to nasal cannula oxygen  Post-op Assessment: Report given to RN and Post -op Vital signs reviewed and stable  Post vital signs: Reviewed and stable  Last Vitals:  Vitals Value Taken Time  BP 117/72 07/19/21 1055  Temp    Pulse 60 07/19/21 1055  Resp 19 07/19/21 1055  SpO2 94 % 07/19/21 1055  Vitals shown include unvalidated device data.  Last Pain:  Vitals:   07/19/21 1016  TempSrc: Temporal  PainSc: 0-No pain         Complications: No notable events documented.

## 2021-07-19 NOTE — Progress Notes (Signed)
Inpatient Follow-up/Progress Note   Patient ID: Chris Kent is a 86 y.o. male.  Overnight Events / Subjective Findings Patient resting in bed comfortably. Seen in pre-op. No acute complaints. Tolerating clear liquid diet. Spitting up some. No abdominal pain or nausea/vomiting today. No other acute gi complaints. Plan for egd today- npo since midnight  Review of Systems  Constitutional:  Negative for activity change, appetite change, chills, diaphoresis, fatigue, fever and unexpected weight change.  HENT:  Positive for trouble swallowing. Negative for voice change.   Respiratory:  Negative for shortness of breath and wheezing.   Cardiovascular:  Negative for chest pain, palpitations and leg swelling.  Gastrointestinal:  Positive for nausea (improved) and vomiting (improved). Negative for abdominal distention, abdominal pain, anal bleeding, blood in stool, constipation and diarrhea.  Musculoskeletal:  Negative for arthralgias and myalgias.  Skin:  Negative for color change and pallor.  Neurological:  Negative for dizziness, syncope and weakness.  Psychiatric/Behavioral:  Negative for confusion. The patient is not nervous/anxious.   All other systems reviewed and are negative.    Medications  Current Facility-Administered Medications:    0.9 %  sodium chloride infusion, , Intravenous, Continuous, Annamaria Helling, DO, Last Rate: 20 mL/hr at 07/19/21 1022, New Bag at 07/19/21 1022   [MAR Hold] dorzolamide-timolol (COSOPT) 22.3-6.8 MG/ML ophthalmic solution 1 drop, 1 drop, Right Eye, BID, Norins, Heinz Knuckles, MD, 1 drop at 07/19/21 0955   [MAR Hold] furosemide (LASIX) injection 40 mg, 40 mg, Intravenous, Daily, Emeterio Reeve, DO, 40 mg at 07/19/21 0956   [MAR Hold] latanoprost (XALATAN) 0.005 % ophthalmic solution 1 drop, 1 drop, Both Eyes, QHS, Norins, Heinz Knuckles, MD, 1 drop at 07/18/21 2040   [facility] Hold] methylPREDNISolone sodium succinate (SOLU-MEDROL) 40 mg/mL injection 40  mg, 40 mg, Intravenous, Daily, Emeterio Reeve, DO, 40 mg at 07/19/21 0956   [MAR Hold] metoprolol tartrate (LOPRESSOR) injection 5 mg, 5 mg, Intravenous, Q12H, Emeterio Reeve, DO, 5 mg at 07/19/21 0956   [MAR Hold] pantoprazole (PROTONIX) injection 40 mg, 40 mg, Intravenous, Q24H, Alexander, Lanelle Bal, DO, 40 mg at 07/18/21 1634  sodium chloride 20 mL/hr at 07/19/21 1022       Objective    Vitals:   07/18/21 1953 07/19/21 0426 07/19/21 0800 07/19/21 1016  BP: 120/61 (!) 151/74 138/72 130/81  Pulse: 81 66 60 (!) 58  Resp: 18   16  Temp: 97.7 F (36.5 C) 98.1 F (36.7 C) 98.3 F (36.8 C) (!) 96.3 F (35.7 C)  TempSrc:   Oral Temporal  SpO2: 97% 96% 96% 96%  Weight:      Height:         Physical Exam Vitals and nursing note reviewed.  Constitutional:      General: He is not in acute distress.    Appearance: Normal appearance. He is not ill-appearing, toxic-appearing or diaphoretic.  HENT:     Head: Normocephalic and atraumatic.     Nose: Nose normal.     Mouth/Throat:     Mouth: Mucous membranes are moist.     Pharynx: Oropharynx is clear.     Comments: Poor dentition Eyes:     General: No scleral icterus.    Extraocular Movements: Extraocular movements intact.  Cardiovascular:     Rate and Rhythm: Normal rate and regular rhythm.     Heart sounds: Normal heart sounds. No murmur heard.    No friction rub. No gallop.  Pulmonary:     Effort: Pulmonary effort is normal. No  respiratory distress.     Breath sounds: Normal breath sounds. No wheezing, rhonchi or rales.  Abdominal:     General: Bowel sounds are normal. There is no distension.     Palpations: Abdomen is soft.     Tenderness: There is no abdominal tenderness. There is no guarding or rebound.     Comments: protuberant  Musculoskeletal:     Cervical back: Neck supple.     Right lower leg: No edema.     Left lower leg: No edema.  Skin:    General: Skin is warm and dry.     Coloration: Skin is not  jaundiced or pale.  Neurological:     General: No focal deficit present.     Mental Status: He is alert and oriented to person, place, and time. Mental status is at baseline.  Psychiatric:        Mood and Affect: Mood normal.        Behavior: Behavior normal.        Thought Content: Thought content normal.        Judgment: Judgment normal.      Laboratory Data Recent Labs  Lab 07/17/21 0505 07/18/21 0543 07/19/21 0545  WBC 9.9 8.7 14.1*  HGB 12.7* 13.8 13.6  HCT 40.8 43.3 41.9  PLT 145* 144* 167    Recent Labs  Lab 07/16/21 1416 07/18/21 0543 07/19/21 0545  NA 146* 143 143  K 4.2 3.5 3.5  CL 108 107 105  CO2 33* 30 32  BUN '16 15 16  '$ CREATININE 0.97 0.87 0.95  CALCIUM 8.5* 8.5* 8.7*  PROT 6.2*  --   --   BILITOT 1.2  --   --   ALKPHOS 72  --   --   ALT 13  --   --   AST 14*  --   --   GLUCOSE 120* 160* 102*    No results for input(s): "INR" in the last 168 hours.    Imaging Studies: X-ray chest PA and lateral  Result Date: 07/18/2021 CLINICAL DATA:  86 year old male with esophageal dysmotility. Bibasilar lung opacity on recent CT Abdomen and Pelvis. EXAM: CHEST - 2 VIEW COMPARISON:  Chest radiographs and CT Abdomen and Pelvis 07/16/2021 and earlier. FINDINGS: Seated AP and lateral views of the chest at 0640 hours. Stable cardiomegaly and mediastinal contours. Mildly lower lung volumes. Visualized tracheal air column is within normal limits. No pneumothorax, pulmonary edema or definite pleural effusion. Patchy posterior costophrenic angle opacity appears unchanged from the recent CT. Underlying spinal ankylosis and posterior spinal fusion hardware. No acute osseous abnormality identified. Paucity of bowel gas in the upper abdomen. IMPRESSION: 1. Unchanged nonspecific patchy posterior costophrenic angle opacity since 07/16/2021. 2. No new cardiopulmonary abnormality. 3. Cardiomegaly.  Spinal ankylosis and posterior fusion hardware. Electronically Signed   By: Genevie Ann  M.D.   On: 07/18/2021 06:59   DG UGI W SINGLE CM (SOL OR THIN BA)  Result Date: 07/17/2021 CLINICAL DATA:  Nausea and vomiting for several months, with persistent vomiting x2 days. Aspiration pneumonia and coughing with drinking liquids. EXAM: DG UGI W SINGLE CM TECHNIQUE: Due to the patient's physical condition and severe nausea, single contrast examination was performed using thin barium. This exam was performed by Tsosie Billing PA-C, and was supervised and interpreted by Dr. Kris Hartmann. FLUOROSCOPY: Radiation Exposure Index (as provided by the fluoroscopic device): 79.70 mGy Kerma COMPARISON:  CT abdomen and pelvis with contrast 07/16/2021 FINDINGS: Exam is technically suboptimal due to  patient's limited physical mobility, and inability to perform rapid bolus swallowing due to severe nausea. Esophagus: Posterior cervical spine fusion hardware noted. No evidence of vestibular penetration or aspiration during swallowing. The esophagus is nondilated. Esophageal stasis is seen due to severe dysmotility, as described below. Evaluation is limited by esophageal dysmotility, however there is an apparent smooth stricture at the GE junction, which limits filling the stomach. Esophageal motility: Severe dysmotility is visualized throughout the entire esophagus, with lack of primary contraction and diffuse tertiary contractions seen. Gastroesophageal reflux:  None visualized. Ingested 36m barium tablet: Not given. Stomach: Limited evaluation due to inability to perform rapid bolus swallowing and episode of emesis. No evidence of hiatal hernia. The stomach is nondilated and there is no evidence of gastric outlet obstruction. Gastric emptying: Normal. Duodenum: Limited evaluation due to incomplete distention, however no definite abnormality identified. IMPRESSION: Technically limited exam due to patient immobility, inability to perform bolus swallowing, and nausea with episode of vomiting during the exam. No evidence of  vestibular penetration or aspiration. Severe esophageal dysmotility. Stricture at the GE junction. Upper endoscopy is recommended for further evaluation. No evidence of hiatal hernia or gastroesophageal reflux. Limited evaluation of stomach and duodenum. No evidence of gastric outlet obstruction. Electronically Signed   By: JMarlaine HindM.D.   On: 07/17/2021 12:34    Assessment:   # Dysphagia -UGI shows severe dysmotility. 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 He reports this does not feel like that - repeat EGD in 2005 did not note any stricture or abnormality. Recommended motility manometry- does not appear that this was ever done by records - Gastroparesis is also of consideration given his sx   # Aspiration Pna- being treated for - improving from respiratory perspective   # PE on eliquis - rcd eliquis this morning (07/17/21) # HFpEF # OSA # Spinal Fusion # HLD #HTN  Plan:  EGD today with possible dilation and intervention NPO since midnight Morning labs reviewed- mild leukocytosis; afebrile, procal negative Plan for EGD tomorrow with possible dilation (48 hours removed from eliquis) Eliquis currently being held Once dilated, will likely start ppi if not already taking Will likely need manometry/motility study as outpatient Prn anti emetics as per primary team  Further recommendations pending endoscopy. Please see report for details  Esophagogastroduodenoscopy with possible biopsy, control of bleeding, polypectomy, and interventions as necessary has been discussed with the patient/patient representative. Informed consent was obtained from the patient/patient representative after explaining the indication, nature, and risks of the procedure  including but not limited to death, bleeding, perforation, missed neoplasm/lesions, cardiorespiratory compromise, and reaction to medications. Opportunity for questions was given and appropriate answers were provided. Patient/patient representative has verbalized understanding is amenable to undergoing the procedure.  I personally performed the service.  Management of other medical comorbidities as per primary team  Thank you for allowing [facility] participate in this patient's care. Please don't hesitate to call if any questions or concerns arise.   SAnnamaria Helling DO [facility]  Portions of the record may have been created with voice recognition software. Occasional wrong-word or 'sound-a-like' substitutions may have occurred due to the inherent limitations of voice recognition software.  Read the chart carefully and recognize, using context, where substitutions may have occurred.

## 2021-07-19 NOTE — Anesthesia Postprocedure Evaluation (Signed)
Anesthesia Post Note  Patient: Aron Baba  Procedure(s) Performed: ESOPHAGOGASTRODUODENOSCOPY (EGD) WITH PROPOFOL  Patient location during evaluation: Endoscopy Anesthesia Type: General Level of consciousness: awake and alert Pain management: pain level controlled Vital Signs Assessment: post-procedure vital signs reviewed and stable Respiratory status: spontaneous breathing, nonlabored ventilation, respiratory function stable and patient connected to nasal cannula oxygen Cardiovascular status: blood pressure returned to baseline and stable Postop Assessment: no apparent nausea or vomiting Anesthetic complications: no   No notable events documented.   Last Vitals:  Vitals:   07/19/21 1115 07/19/21 1120  BP: 137/78 131/74  Pulse: (!) 54 (!) 56  Resp: 16 18  Temp:  36.7 C  SpO2: 94% 95%    Last Pain:  Vitals:   07/19/21 1120  TempSrc: Oral  PainSc:                  Martha Clan

## 2021-07-19 NOTE — Anesthesia Preprocedure Evaluation (Addendum)
Anesthesia Evaluation  Patient identified by MRN, date of birth, ID band Patient awake    Reviewed: Allergy & Precautions, H&P , NPO status , Patient's Chart, lab work & pertinent test results, reviewed documented beta blocker date and time   History of Anesthesia Complications Negative for: history of anesthetic complications  Airway Mallampati: II  TM Distance: >3 FB Neck ROM: full    Dental  (+) Dental Advidsory Given, Poor Dentition, Missing   Pulmonary neg shortness of breath, asthma , sleep apnea , pneumonia (aspiration; mild and improving), COPD,  oxygen dependent,    Pulmonary exam normal breath sounds clear to auscultation       Cardiovascular Exercise Tolerance: Good hypertension, (-) angina+ CAD, + Peripheral Vascular Disease and +CHF  (-) Past MI and (-) Cardiac Stents Normal cardiovascular exam(-) dysrhythmias (-) Valvular Problems/Murmurs Rhythm:regular Rate:Normal  ECG 07/16/21:  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 Left ventricular ejection fraction, by estimation, is 55 to 60%. The left ventricle has normal function. The left ventricle has no regional wall motion abnormalities. There is mild left ventricular hypertrophy. Left ventricular diastolic function could not be evaluated.  2. Right ventricular systolic function is normal. The right ventricular size is normal.  3. The mitral valve is normal in structure. No evidence of mitral valve regurgitation.  4. The aortic valve is tricuspid. Aortic valve regurgitation is not visualized.  5. The inferior vena cava is normal in size with greater than 50% respiratory variability, suggesting right atrial pressure of 3 mmHg.   Neuro/Psych neg Seizures PSYCHIATRIC DISORDERS Anxiety S/p thoracic and cervical spine fusions CVA    GI/Hepatic negative GI ROS, Neg liver ROS, GERD  ,Esophagitis with remote hx dilation several years ago    Endo/Other  negative endocrine ROS  Renal/GU negative Renal ROS  negative genitourinary   Musculoskeletal   Abdominal   Peds  Hematology negative hematology ROS (+) Hx PE on Eliquis   Anesthesia Other Findings Cardiology note 07/10/21:  86 year old woman recently hospitalized for pulmonary embolus and acute on chronic diastolic congestive heart failure, currently clinically stable, on Eliquis and maintenance torsemide. Patient has essential hypertension, blood pressure well controlled on metoprolol succinate and torsemide. Losartan was recently discontinued due to low blood pressure.  Plan  1. Continue current medications 2. Counseled patient about low-sodium diet 3. DASH diet printed instructions given to the patient 4. Counseled patient about low-cholesterol diet 5. Continue atorvastatin for hyperlipidemia management 6. Low-fat and cholesterol diet printed instructions given to the patient 7. Return to clinic for follow-up in 3 months  No orders of the defined types were placed in this encounter.  Return in about 3 months (around 10/10/2021).   Reproductive/Obstetrics negative OB ROS                            Anesthesia Physical Anesthesia Plan  ASA: 3  Anesthesia Plan: General   Post-op Pain Management:    Induction: Intravenous  PONV Risk Score and Plan: 2 and Propofol infusion and TIVA  Airway Management Planned: Natural Airway and Nasal Cannula  Additional Equipment:   Intra-op Plan:   Post-operative Plan:   Informed Consent: I have reviewed the patients History and Physical, chart, labs and discussed the procedure including the risks, benefits and alternatives for the proposed anesthesia with the patient or authorized representative who has indicated his/her understanding and acceptance.     Dental Advisory Given  Plan  Discussed with: Anesthesiologist, CRNA and Surgeon  Anesthesia Plan Comments:        Anesthesia Quick  Evaluation

## 2021-07-19 NOTE — Progress Notes (Signed)
Patient's daughter June Parrish [phone] called regarding WBC count  Is concerned that patient needs antibiotics. Requests call back from provider.

## 2021-07-19 NOTE — H&P (View-Only) (Signed)
Inpatient Follow-up/Progress Note   Patient ID: Chris Kent is a 86 y.o. male.  Overnight Events / Subjective Findings Patient resting in bed comfortably. Seen in pre-op. No acute complaints. Tolerating clear liquid diet. Spitting up some. No abdominal pain or nausea/vomiting today. No other acute gi complaints. Plan for egd today- npo since midnight  Review of Systems  Constitutional:  Negative for activity change, appetite change, chills, diaphoresis, fatigue, fever and unexpected weight change.  HENT:  Positive for trouble swallowing. Negative for voice change.   Respiratory:  Negative for shortness of breath and wheezing.   Cardiovascular:  Negative for chest pain, palpitations and leg swelling.  Gastrointestinal:  Positive for nausea (improved) and vomiting (improved). Negative for abdominal distention, abdominal pain, anal bleeding, blood in stool, constipation and diarrhea.  Musculoskeletal:  Negative for arthralgias and myalgias.  Skin:  Negative for color change and pallor.  Neurological:  Negative for dizziness, syncope and weakness.  Psychiatric/Behavioral:  Negative for confusion. The patient is not nervous/anxious.   All other systems reviewed and are negative.    Medications  Current Facility-Administered Medications:    0.9 %  sodium chloride infusion, , Intravenous, Continuous, Annamaria Helling, DO, Last Rate: 20 mL/hr at 07/19/21 1022, New Bag at 07/19/21 1022   [MAR Hold] dorzolamide-timolol (COSOPT) 22.3-6.8 MG/ML ophthalmic solution 1 drop, 1 drop, Right Eye, BID, Norins, Heinz Knuckles, MD, 1 drop at 07/19/21 0955   [MAR Hold] furosemide (LASIX) injection 40 mg, 40 mg, Intravenous, Daily, Emeterio Reeve, DO, 40 mg at 07/19/21 0956   [MAR Hold] latanoprost (XALATAN) 0.005 % ophthalmic solution 1 drop, 1 drop, Both Eyes, QHS, Norins, Heinz Knuckles, MD, 1 drop at 07/18/21 2040   [facility] Hold] methylPREDNISolone sodium succinate (SOLU-MEDROL) 40 mg/mL injection 40  mg, 40 mg, Intravenous, Daily, Emeterio Reeve, DO, 40 mg at 07/19/21 0956   [MAR Hold] metoprolol tartrate (LOPRESSOR) injection 5 mg, 5 mg, Intravenous, Q12H, Emeterio Reeve, DO, 5 mg at 07/19/21 0956   [MAR Hold] pantoprazole (PROTONIX) injection 40 mg, 40 mg, Intravenous, Q24H, Alexander, Lanelle Bal, DO, 40 mg at 07/18/21 1634  sodium chloride 20 mL/hr at 07/19/21 1022       Objective    Vitals:   07/18/21 1953 07/19/21 0426 07/19/21 0800 07/19/21 1016  BP: 120/61 (!) 151/74 138/72 130/81  Pulse: 81 66 60 (!) 58  Resp: 18   16  Temp: 97.7 F (36.5 C) 98.1 F (36.7 C) 98.3 F (36.8 C) (!) 96.3 F (35.7 C)  TempSrc:   Oral Temporal  SpO2: 97% 96% 96% 96%  Weight:      Height:         Physical Exam Vitals and nursing note reviewed.  Constitutional:      General: He is not in acute distress.    Appearance: Normal appearance. He is not ill-appearing, toxic-appearing or diaphoretic.  HENT:     Head: Normocephalic and atraumatic.     Nose: Nose normal.     Mouth/Throat:     Mouth: Mucous membranes are moist.     Pharynx: Oropharynx is clear.     Comments: Poor dentition Eyes:     General: No scleral icterus.    Extraocular Movements: Extraocular movements intact.  Cardiovascular:     Rate and Rhythm: Normal rate and regular rhythm.     Heart sounds: Normal heart sounds. No murmur heard.    No friction rub. No gallop.  Pulmonary:     Effort: Pulmonary effort is normal. No  respiratory distress.     Breath sounds: Normal breath sounds. No wheezing, rhonchi or rales.  Abdominal:     General: Bowel sounds are normal. There is no distension.     Palpations: Abdomen is soft.     Tenderness: There is no abdominal tenderness. There is no guarding or rebound.     Comments: protuberant  Musculoskeletal:     Cervical back: Neck supple.     Right lower leg: No edema.     Left lower leg: No edema.  Skin:    General: Skin is warm and dry.     Coloration: Skin is not  jaundiced or pale.  Neurological:     General: No focal deficit present.     Mental Status: He is alert and oriented to person, place, and time. Mental status is at baseline.  Psychiatric:        Mood and Affect: Mood normal.        Behavior: Behavior normal.        Thought Content: Thought content normal.        Judgment: Judgment normal.      Laboratory Data Recent Labs  Lab 07/17/21 0505 07/18/21 0543 07/19/21 0545  WBC 9.9 8.7 14.1*  HGB 12.7* 13.8 13.6  HCT 40.8 43.3 41.9  PLT 145* 144* 167    Recent Labs  Lab 07/16/21 1416 07/18/21 0543 07/19/21 0545  NA 146* 143 143  K 4.2 3.5 3.5  CL 108 107 105  CO2 33* 30 32  BUN '16 15 16  '$ CREATININE 0.97 0.87 0.95  CALCIUM 8.5* 8.5* 8.7*  PROT 6.2*  --   --   BILITOT 1.2  --   --   ALKPHOS 72  --   --   ALT 13  --   --   AST 14*  --   --   GLUCOSE 120* 160* 102*    No results for input(s): "INR" in the last 168 hours.    Imaging Studies: X-ray chest PA and lateral  Result Date: 07/18/2021 CLINICAL DATA:  86 year old male with esophageal dysmotility. Bibasilar lung opacity on recent CT Abdomen and Pelvis. EXAM: CHEST - 2 VIEW COMPARISON:  Chest radiographs and CT Abdomen and Pelvis 07/16/2021 and earlier. FINDINGS: Seated AP and lateral views of the chest at 0640 hours. Stable cardiomegaly and mediastinal contours. Mildly lower lung volumes. Visualized tracheal air column is within normal limits. No pneumothorax, pulmonary edema or definite pleural effusion. Patchy posterior costophrenic angle opacity appears unchanged from the recent CT. Underlying spinal ankylosis and posterior spinal fusion hardware. No acute osseous abnormality identified. Paucity of bowel gas in the upper abdomen. IMPRESSION: 1. Unchanged nonspecific patchy posterior costophrenic angle opacity since 07/16/2021. 2. No new cardiopulmonary abnormality. 3. Cardiomegaly.  Spinal ankylosis and posterior fusion hardware. Electronically Signed   By: Genevie Ann  M.D.   On: 07/18/2021 06:59   DG UGI W SINGLE CM (SOL OR THIN BA)  Result Date: 07/17/2021 CLINICAL DATA:  Nausea and vomiting for several months, with persistent vomiting x2 days. Aspiration pneumonia and coughing with drinking liquids. EXAM: DG UGI W SINGLE CM TECHNIQUE: Due to the patient's physical condition and severe nausea, single contrast examination was performed using thin barium. This exam was performed by Tsosie Billing PA-C, and was supervised and interpreted by Dr. Kris Hartmann. FLUOROSCOPY: Radiation Exposure Index (as provided by the fluoroscopic device): 79.70 mGy Kerma COMPARISON:  CT abdomen and pelvis with contrast 07/16/2021 FINDINGS: Exam is technically suboptimal due to  patient's limited physical mobility, and inability to perform rapid bolus swallowing due to severe nausea. Esophagus: Posterior cervical spine fusion hardware noted. No evidence of vestibular penetration or aspiration during swallowing. The esophagus is nondilated. Esophageal stasis is seen due to severe dysmotility, as described below. Evaluation is limited by esophageal dysmotility, however there is an apparent smooth stricture at the GE junction, which limits filling the stomach. Esophageal motility: Severe dysmotility is visualized throughout the entire esophagus, with lack of primary contraction and diffuse tertiary contractions seen. Gastroesophageal reflux:  None visualized. Ingested 30m barium tablet: Not given. Stomach: Limited evaluation due to inability to perform rapid bolus swallowing and episode of emesis. No evidence of hiatal hernia. The stomach is nondilated and there is no evidence of gastric outlet obstruction. Gastric emptying: Normal. Duodenum: Limited evaluation due to incomplete distention, however no definite abnormality identified. IMPRESSION: Technically limited exam due to patient immobility, inability to perform bolus swallowing, and nausea with episode of vomiting during the exam. No evidence of  vestibular penetration or aspiration. Severe esophageal dysmotility. Stricture at the GE junction. Upper endoscopy is recommended for further evaluation. No evidence of hiatal hernia or gastroesophageal reflux. Limited evaluation of stomach and duodenum. No evidence of gastric outlet obstruction. Electronically Signed   By: JMarlaine HindM.D.   On: 07/17/2021 12:34    Assessment:   # Dysphagia -UGI shows severe dysmotility. 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 He reports this does not feel like that - repeat EGD in 2005 did not note any stricture or abnormality. Recommended motility manometry- does not appear that this was ever done by records - Gastroparesis is also of consideration given his sx   # Aspiration Pna- being treated for - improving from respiratory perspective   # PE on eliquis - rcd eliquis this morning (07/17/21) # HFpEF # OSA # Spinal Fusion # HLD #HTN  Plan:  EGD today with possible dilation and intervention NPO since midnight Morning labs reviewed- mild leukocytosis; afebrile, procal negative Plan for EGD tomorrow with possible dilation (48 hours removed from eliquis) Eliquis currently being held Once dilated, will likely start ppi if not already taking Will likely need manometry/motility study as outpatient Prn anti emetics as per primary team  Further recommendations pending endoscopy. Please see report for details  Esophagogastroduodenoscopy with possible biopsy, control of bleeding, polypectomy, and interventions as necessary has been discussed with the patient/patient representative. Informed consent was obtained from the patient/patient representative after explaining the indication, nature, and risks of the procedure  including but not limited to death, bleeding, perforation, missed neoplasm/lesions, cardiorespiratory compromise, and reaction to medications. Opportunity for questions was given and appropriate answers were provided. Patient/patient representative has verbalized understanding is amenable to undergoing the procedure.  I personally performed the service.  Management of other medical comorbidities as per primary team  Thank you for allowing [facility] participate in this patient's care. Please don't hesitate to call if any questions or concerns arise.   SAnnamaria Helling DO [facility]  Portions of the record may have been created with voice recognition software. Occasional wrong-word or 'sound-a-like' substitutions may have occurred due to the inherent limitations of voice recognition software.  Read the chart carefully and recognize, using context, where substitutions may have occurred.

## 2021-07-19 NOTE — Care Management Important Message (Signed)
Important Message  Patient Details  Name: Chris Kent MRN: [id number] Date of Birth: 1935/07/20   Medicare Important Message Given:  Yes     Chris Kent Chris Kent 07/19/2021, 11:52 AM

## 2021-07-20 ENCOUNTER — Inpatient Hospital Stay: Payer: PPO

## 2021-07-20 DIAGNOSIS — J690 Pneumonitis due to inhalation of food and vomit: Secondary | ICD-10-CM | POA: Diagnosis not present

## 2021-07-20 DIAGNOSIS — K222 Esophageal obstruction: Secondary | ICD-10-CM | POA: Diagnosis not present

## 2021-07-20 DIAGNOSIS — I5032 Chronic diastolic (congestive) heart failure: Secondary | ICD-10-CM | POA: Diagnosis not present

## 2021-07-20 LAB — BASIC METABOLIC PANEL
Anion gap: 4 — ABNORMAL LOW (ref 5–15)
BUN: 21 mg/dL (ref 8–23)
CO2: 34 mmol/L — ABNORMAL HIGH (ref 22–32)
Calcium: 8.9 mg/dL (ref 8.9–10.3)
Chloride: 106 mmol/L (ref 98–111)
Creatinine, Ser: 0.9 mg/dL (ref 0.61–1.24)
GFR, Estimated: >60 mL/min (ref >60)
Glucose, Bld: 95 mg/dL (ref 70–99)
Potassium: 3.6 mmol/L (ref 3.5–5.1)
Sodium: 144 mmol/L (ref 135–145)

## 2021-07-20 MED ORDER — SUCRALFATE 1 GM/10ML PO SUSP: 1.0000 g | Freq: Three times a day (TID) | ORAL | 0 refills | Status: DC

## 2021-07-20 MED ORDER — AMOXICILLIN-POT CLAVULANATE 250-62.5 MG/5ML PO SUSR: 500.0000 mg | Freq: Two times a day (BID) | ORAL | 0 refills | Status: AC

## 2021-07-20 MED ORDER — PANTOPRAZOLE SODIUM 40 MG IV SOLR: 40.0000 mg | INTRAVENOUS | 0 refills | Status: DC

## 2021-07-20 MED ORDER — PANTOPRAZOLE SODIUM 40 MG PO TBEC: 40.0000 mg | DELAYED_RELEASE_TABLET | Freq: Every day | ORAL | 0 refills | Status: DC

## 2021-07-20 NOTE — Discharge Summary (Signed)
Physician Discharge Summary   Patient: Chris Kent MRN: [id number] DOB: 17-Apr-1935  Admit date:     07/16/2021  Discharge date: 07/20/21  Discharge Physician: Sharen Hones   PCP: Idelle Crouch, MD   Recommendations at discharge:   Follow-up with PCP in 1 week. Follow-up with GI in 1 month.  Discharge Diagnoses: Principal Problem:   Esophageal stricture Active Problems:   At high risk for aspiration   Essential hypertension, benign   Hyperlipidemia   Esophagitis   Chronic diastolic CHF (congestive heart failure) ([place])   Current chronic use of systemic steroids   Aspiration pneumonia ([place])  Resolved Problems:   * No resolved hospital problems. [facility] Course: [name], an 86 y/o, with h/o CAD, HFpEF, h/o PE on Eliquis, h/o esophagitis with remote dilation of esophageal stricture at [place] many years ago, HTN. To ED 07/16/21 w/ c/o 1 week intermittent emesis immediately after he eats. Chest x-ray showed possible aspiration.  CT abdomen/pelvis not show significant abnormality. [facility] GI on 6/13 showed severe esophageal dysmotility.  Stricture at the GE junction.  Patient has been eval by GI, planning for EGD on 6/15 due to recent Eliquis use.  Patient had a EGD performed on 6/15, showed a severe esophageal dysmotility, no structural obstruction.  Had esophageal dilation during the procedure.  Assessment and Plan: Esophageal stricture Severe esophageal dysmotility Emesis Esophagitis Aspiration pneumonia  Patient is status post EGD and esophageal dilation, patient has a severe esophageal dysmotility.  Postprocedure, patient had significant dysphagia.  Dysphagia gradually improved overnight.  He also had significant cough, he also having leukocytosis today.  Based on prior chest x-ray, patient has a chemical aspiration pneumonia, however, with elevated leukocytes, will treat for 5 days with Augmentin.  Essential hypertension. Continue current treatment  Chronic diastolic  congestive heart failure No evidence of volume overload   Chronic steroid use. I reviewed patient chart, I did not see any indication for steroids.  Patient will be followed by PCP in the office, probably should be weaned off if no additional indication for steroids.  Patient cough and short of breath appears to be secondary to aspiration which has been better.  Hypokalemia. Continue home dose potassium.       Consultants: GI Procedures performed: EGD  Disposition: Home Diet recommendation:  Discharge Diet Orders (From admission, onward)     Start     Ordered   07/20/21 0000  Diet general       Comments: Mechanical soft diet   07/20/21 1053           Dysphagia type 3 thin Liquid DISCHARGE MEDICATION: Allergies as of 07/20/2021   No Known Allergies      Medication List     STOP taking these medications    senna-docusate 8.6-50 MG tablet Commonly known as: Senokot-S       TAKE these medications    amoxicillin-clavulanate 250-62.5 MG/5ML suspension Commonly known as: AUGMENTIN Take 10 mLs (500 mg total) by mouth 2 (two) times daily for 5 days.   apixaban 5 MG Tabs tablet Commonly known as: ELIQUIS Take two tablets twice a day for six days then one tablet twice a day afterwards   budesonide 0.5 MG/2ML nebulizer solution Commonly known as: PULMICORT Take 2 mLs by nebulization as directed.   cetirizine 10 MG tablet Commonly known as: ZYRTEC Take 10 mg by mouth at bedtime.   chlorpheniramine-HYDROcodone 10-8 MG/5ML Take 5 mLs by mouth every 12 (twelve) hours.   donepezil 5  MG tablet Commonly known as: ARICEPT Take 5 mg by mouth daily.   dorzolamidel-timolol 22.3-6.8 MG/ML Soln ophthalmic solution Commonly known as: COSOPT Place 1 drop into the right eye 2 (two) times daily.   ergocalciferol 1.25 MG (50000 UT) capsule Commonly known as: VITAMIN D2 Take 1 capsule (50,000 Units total) by mouth once a week.   fludrocortisone 0.1 MG tablet Commonly  known as: FLORINEF Take 100 mcg by mouth daily.   latanoprost 0.005 % ophthalmic solution Commonly known as: XALATAN Place 1 drop into both eyes at bedtime.   metoprolol succinate 25 MG 24 hr tablet Commonly known as: TOPROL-XL Take 12.5 mg by mouth daily.   midodrine 10 MG tablet Commonly known as: PROAMATINE Take 10 mg by mouth 3 (three) times daily.   pantoprazole 40 MG injection Commonly known as: PROTONIX Inject 40 mg into the vein daily.   potassium chloride SA 20 MEQ tablet Commonly known as: KLOR-CON M Take 2 tablets (40 mEq total) by mouth daily.   predniSONE 5 MG tablet Commonly known as: DELTASONE Take 5 mg by mouth daily.   rosuvastatin 10 MG tablet Commonly known as: CRESTOR Take 10 mg by mouth at bedtime.   sucralfate 1 GM/10ML suspension Commonly known as: CARAFATE Take 10 mLs (1 g total) by mouth 4 (four) times daily -  with meals and at bedtime for 10 days.   tamsulosin 0.4 MG Caps capsule Commonly known as: FLOMAX Take 2 capsules (0.8 mg total) by mouth daily after supper.   torsemide 20 MG tablet Commonly known as: DEMADEX Take 2 tablets po daily in am   triamcinolone cream 0.1 % Commonly known as: KENALOG Apply 1 application topically 3 (three) times daily. (Apply to rash on legs)   Vitamin B 12 500 MCG Tabs Take 500 mcg by mouth daily.        Follow-up Information     Idelle Crouch, MD Follow up in 1 week(s).   Specialty: Internal Medicine Contact information: [place] [address] [phone]         Annamaria Helling, DO Follow up in 1 month(s).   Specialty: Gastroenterology Contact information: [facility] [place] [address] [phone]                Discharge Exam: Danley Danker Weights   07/16/21 1414 07/17/21 0102  Weight: 103 kg 103 kg   General exam: Appears calm and comfortable  Respiratory system: Clear to auscultation.  Respiratory effort normal. Cardiovascular system: S1 & S2 heard, RRR. No JVD, murmurs, rubs, gallops or clicks. No pedal edema. Gastrointestinal system: Abdomen is nondistended, soft and nontender. No organomegaly or masses felt. Normal bowel sounds heard. Central nervous system: Alert and oriented. No focal neurological deficits. Extremities: Symmetric 5 x 5 power. Skin: No rashes, lesions or ulcers Psychiatry: Judgement and insight appear normal. Mood & affect appropriate.    Condition at discharge: good  The results of significant diagnostics from this hospitalization (including imaging, microbiology, ancillary and laboratory) are listed below for reference.   Imaging Studies: X-ray chest PA and lateral  Result Date: 07/18/2021 CLINICAL DATA:  86 year old male with esophageal dysmotility. Bibasilar lung opacity on recent CT Abdomen and Pelvis. EXAM: CHEST - 2 VIEW COMPARISON:  Chest radiographs and CT Abdomen and Pelvis 07/16/2021 and earlier. FINDINGS: Seated AP and lateral views of the chest at 0640 hours. Stable cardiomegaly and mediastinal contours. Mildly lower lung volumes. Visualized tracheal air column is  within normal limits. No pneumothorax, pulmonary edema or definite pleural effusion. Patchy posterior costophrenic angle opacity appears unchanged from the recent CT. Underlying spinal ankylosis and posterior spinal fusion hardware. No acute osseous abnormality identified. Paucity of bowel gas in the upper abdomen. IMPRESSION: 1. Unchanged nonspecific patchy posterior costophrenic angle opacity since 07/16/2021. 2. No new cardiopulmonary abnormality. 3. Cardiomegaly.  Spinal ankylosis and posterior fusion hardware. Electronically Signed   By: Genevie Ann M.D.   On: 07/18/2021 06:59   DG UGI W SINGLE CM (SOL OR THIN BA)  Result Date: 07/17/2021 CLINICAL DATA:  Nausea and vomiting for several months, with persistent vomiting x2 days. Aspiration pneumonia and coughing with drinking liquids.  EXAM: DG UGI W SINGLE CM TECHNIQUE: Due to the patient's physical condition and severe nausea, single contrast examination was performed using thin barium. This exam was performed by Tsosie Billing PA-C, and was supervised and interpreted by Dr. Kris Hartmann. FLUOROSCOPY: Radiation Exposure Index (as provided by the fluoroscopic device): 79.70 mGy Kerma COMPARISON:  CT abdomen and pelvis with contrast 07/16/2021 FINDINGS: Exam is technically suboptimal due to patient's limited physical mobility, and inability to perform rapid bolus swallowing due to severe nausea. Esophagus: Posterior cervical spine fusion hardware noted. No evidence of vestibular penetration or aspiration during swallowing. The esophagus is nondilated. Esophageal stasis is seen due to severe dysmotility, as described below. Evaluation is limited by esophageal dysmotility, however there is an apparent smooth stricture at the GE junction, which limits filling the stomach. Esophageal motility: Severe dysmotility is visualized throughout the entire esophagus, with lack of primary contraction and diffuse tertiary contractions seen. Gastroesophageal reflux:  None visualized. Ingested 26m barium tablet: Not given. Stomach: Limited evaluation due to inability to perform rapid bolus swallowing and episode of emesis. No evidence of hiatal hernia. The stomach is nondilated and there is no evidence of gastric outlet obstruction. Gastric emptying: Normal. Duodenum: Limited evaluation due to incomplete distention, however no definite abnormality identified. IMPRESSION: Technically limited exam due to patient immobility, inability to perform bolus swallowing, and nausea with episode of vomiting during the exam. No evidence of vestibular penetration or aspiration. Severe esophageal dysmotility. Stricture at the GE junction. Upper endoscopy is recommended for further evaluation. No evidence of hiatal hernia or gastroesophageal reflux. Limited evaluation of stomach and  duodenum. No evidence of gastric outlet obstruction. Electronically Signed   By: JMarlaine HindM.D.   On: 07/17/2021 12:34   CT ABDOMEN PELVIS W CONTRAST  Result Date: 07/16/2021 CLINICAL DATA:  Vomiting for 7 days. EXAM: CT ABDOMEN AND PELVIS WITH CONTRAST TECHNIQUE: Multidetector CT imaging of the abdomen and pelvis was performed using the standard protocol following bolus administration of intravenous contrast. RADIATION DOSE REDUCTION: This exam was performed according to the departmental dose-optimization program which includes automated exposure control, adjustment of the mA and/or kV according to patient size and/or use of iterative reconstruction technique. CONTRAST:  1038mOMNIPAQUE IOHEXOL 300 MG/ML  SOLN COMPARISON:  September of 2022. FINDINGS: Lower chest: Patchy basilar airspace disease in the dependent LEFT and RIGHT chest without effusion. Small pericardial effusion is smaller than on previous exams, heart is incompletely imaged. Hepatobiliary: Post cholecystectomy. Smooth contour of the liver. No focal, suspicious hepatic lesion. No biliary duct dilation. Portal vein is patent. Pancreas: Mild pancreatic atrophy without ductal dilation or inflammation. Spleen: Normal. Adrenals/Urinary Tract: Adrenal glands are normal. Symmetric renal enhancement without signs of hydronephrosis. Renal cortical scarring with 2 cm LEFT renal cyst for which no follow-up is recommended in the absence  of localizing symptoms. No perivesical stranding. No hydronephrosis. Stomach/Bowel: No stranding adjacent to the stomach. No small bowel dilation or signs of small bowel inflammation. No dilation of the colon. No signs of colonic inflammation. Vascular/Lymphatic: Aortic atherosclerosis. No sign of aneurysm. Smooth contour of the IVC. There is no gastrohepatic or hepatoduodenal ligament lymphadenopathy. No retroperitoneal or mesenteric lymphadenopathy. No pelvic sidewall lymphadenopathy. Atherosclerotic changes are moderate  with mix calcified and noncalcified plaque. Reproductive: Unremarkable by CT. Other: No ascites.  No pneumoperitoneum. Musculoskeletal: Signs of spinal fusion of the lower thoracic and upper lumbar spine, incompletely imaged on CT. Bilateral degenerative changes of the hips. Changes related to ankylosing spondylitis in the spine. IMPRESSION: 1. Patchy basilar airspace disease in the dependent LEFT and RIGHT chest without effusion, suspicious for mild aspiration change mixed with atelectasis. 2. No acute intra-abdominal or pelvic process. 3. Post cholecystectomy. 4. 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 5. Aortic atherosclerosis. Aortic Atherosclerosis (ICD10-I70.0). Electronically Signed   By: Zetta Bills M.D.   On: 07/16/2021 20:10   DG Chest 2 View  Result Date: 07/16/2021 CLINICAL DATA:  Cough, shortness of breath and vomiting. EXAM: CHEST - 2 VIEW COMPARISON:  February 24, 2021. FINDINGS: EKG leads project over the chest. Cardiomediastinal contours and hilar structures are stable. Mild LEFT lower lobe airspace disease is similar to prior imaging. No sign of pleural effusion. IMPRESSION: Mild LEFT lower lobe airspace disease is similar to prior imaging, likely scarring given lack of change. No new areas of consolidation or sign of pleural effusion. Electronically Signed   By: Zetta Bills M.D.   On: 07/16/2021 18:12   Abdomen 1 view (KUB)  Result Date: 06/30/2021 CLINICAL DATA:  Renal stone EXAM: ABDOMEN - 1 VIEW COMPARISON:  02/25/2021 FINDINGS: Bowel gas pattern is nonspecific. No abnormal masses or calcifications are seen. Kidneys are partly obscured by bowel contents. There is previous surgical fusion in the lower thoracic and upper lumbar spine. Extensive ligamentous calcifications are seen in the lumbar spine. Please correlate for possible ankylosing spondylitis. Degenerative changes are noted in both hips.  IMPRESSION: Nonspecific bowel gas pattern. No abnormal masses or calcifications are seen. Other findings as described in the body of the report. Electronically Signed   By: Elmer Picker M.D.   On: 06/30/2021 21:02    Microbiology: Results for orders placed or performed during the hospital encounter of 02/24/21  Resp Panel by RT-PCR (Flu A&B, Covid) Nasopharyngeal Swab     Status: None   Collection Time: 02/25/21  2:52 AM   Specimen: Nasopharyngeal Swab; Nasopharyngeal(NP) swabs in vial transport medium  Result Value Ref Range Status   SARS Coronavirus 2 by RT PCR NEGATIVE NEGATIVE Final    Comment: (NOTE) SARS-CoV-2 target nucleic acids are NOT DETECTED.  The SARS-CoV-2 RNA is generally detectable in upper respiratory specimens during the acute phase of infection. The lowest concentration of SARS-CoV-2 viral copies this assay can detect is 138 copies/mL. A negative result does not preclude SARS-Cov-2 infection and should not be used as the sole basis for treatment or other patient management decisions. A negative result may occur with  improper specimen collection/handling, submission of specimen other than nasopharyngeal swab, presence of viral mutation(s) within the areas targeted by this assay, and inadequate number of viral copies(<138 copies/mL). A negative result must be combined with clinical observations, patient history, and epidemiological information. The expected result is Negative.  Fact Sheet for Patients:  EntrepreneurPulse.com.au  Fact Sheet for Healthcare Providers:  IncredibleEmployment.be  This test is no t yet approved or cleared by the [place] FDA and  has been authorized for detection and/or diagnosis of SARS-CoV-2 by FDA under an Emergency Use Authorization (EUA). This EUA will remain  in effect (meaning this test can be used) for the duration of the COVID-19 declaration under Section 564(b)(1) of the Act,  21 U.S.C.section 360bbb-3(b)(1), unless the authorization is terminated  or revoked sooner.       Influenza A by PCR NEGATIVE NEGATIVE Final   Influenza B by PCR NEGATIVE NEGATIVE Final    Comment: (NOTE) The Xpert Xpress SARS-CoV-2/FLU/RSV plus assay is intended as an aid in the diagnosis of influenza from Nasopharyngeal swab specimens and should not be used as a sole basis for treatment. Nasal washings and aspirates are unacceptable for Xpert Xpress SARS-CoV-2/FLU/RSV testing.  Fact Sheet for Patients: EntrepreneurPulse.com.au  Fact Sheet for Healthcare Providers: IncredibleEmployment.be  This test is not yet approved or cleared by the [place] FDA and has been authorized for detection and/or diagnosis of SARS-CoV-2 by FDA under an Emergency Use Authorization (EUA). This EUA will remain in effect (meaning this test can be used) for the duration of the COVID-19 declaration under Section 564(b)(1) of the Act, 21 U.S.C. section 360bbb-3(b)(1), unless the authorization is terminated or revoked.  Performed at [facility], [address] [place], [place] [address]     Labs: CBC: Recent Labs  Lab 07/16/21 1416 07/17/21 0505 07/18/21 0543 07/19/21 0545  WBC 10.0 9.9 8.7 14.1*  HGB 14.0 12.7* 13.8 13.6  HCT 45.6 40.8 43.3 41.9  MCV 98.9 96.7 96.4 95.4  PLT 148* 145* 144* 567   Basic Metabolic Panel: Recent Labs  Lab 07/16/21 1416 07/18/21 0543 07/19/21 0545 07/20/21 0731  NA 146* 143 143 144  K 4.2 3.5 3.5 3.6  CL 108 107 105 106  CO2 33* 30 32 34*  GLUCOSE 120* 160* 102* 95  BUN '16 15 16 21  '$ CREATININE 0.97 0.87 0.95 0.90  CALCIUM 8.5* 8.5* 8.7* 8.9  MG  --   --  2.2  --    Liver Function Tests: Recent Labs  Lab 07/16/21 1416  AST 14*  ALT 13  ALKPHOS 72  BILITOT 1.2  PROT 6.2*  ALBUMIN 3.2*   CBG: No results for input(s): "GLUCAP" in the last 168 hours.  Discharge time spent: greater than 30  minutes.  Signed: Sharen Hones, MD [facility] 07/20/2021

## 2021-07-20 NOTE — Plan of Care (Signed)
  Problem: Education: Goal: Knowledge of General Education information will improve Description: Including pain rating scale, medication(s)/side effects and non-pharmacologic comfort measures 07/20/2021 1119 by Orvan Seen, RN Outcome: Completed/Met 07/20/2021 1000 by Orvan Seen, RN Outcome: Progressing   Problem: Health Behavior/Discharge Planning: Goal: Ability to manage health-related needs will improve 07/20/2021 1119 by Orvan Seen, RN Outcome: Completed/Met 07/20/2021 1000 by Orvan Seen, RN Outcome: Progressing   Problem: Clinical Measurements: Goal: Ability to maintain clinical measurements within normal limits will improve 07/20/2021 1119 by Orvan Seen, RN Outcome: Completed/Met 07/20/2021 1000 by Orvan Seen, RN Outcome: Progressing Goal: Will remain free from infection 07/20/2021 1119 by Orvan Seen, RN Outcome: Completed/Met 07/20/2021 1000 by Orvan Seen, RN Outcome: Progressing Goal: Diagnostic test results will improve 07/20/2021 1119 by Orvan Seen, RN Outcome: Completed/Met 07/20/2021 1000 by Orvan Seen, RN Outcome: Progressing Goal: Respiratory complications will improve 07/20/2021 1119 by Orvan Seen, RN Outcome: Completed/Met 07/20/2021 1000 by Orvan Seen, RN Outcome: Progressing Goal: Cardiovascular complication will be avoided 07/20/2021 1119 by Orvan Seen, RN Outcome: Completed/Met 07/20/2021 1000 by Orvan Seen, RN Outcome: Progressing   Problem: Activity: Goal: Risk for activity intolerance will decrease 07/20/2021 1119 by Orvan Seen, RN Outcome: Completed/Met 07/20/2021 1000 by Orvan Seen, RN Outcome: Progressing   Problem: Nutrition: Goal: Adequate nutrition will be maintained 07/20/2021 1119 by Orvan Seen, RN Outcome: Completed/Met 07/20/2021 1000 by Orvan Seen, RN Outcome: Progressing   Problem: Coping: Goal: Level of anxiety will decrease 07/20/2021  1119 by Orvan Seen, RN Outcome: Completed/Met 07/20/2021 1000 by Orvan Seen, RN Outcome: Progressing   Problem: Elimination: Goal: Will not experience complications related to bowel motility 07/20/2021 1119 by Orvan Seen, RN Outcome: Completed/Met 07/20/2021 1000 by Orvan Seen, RN Outcome: Progressing Goal: Will not experience complications related to urinary retention 07/20/2021 1119 by Orvan Seen, RN Outcome: Completed/Met 07/20/2021 1000 by Orvan Seen, RN Outcome: Progressing   Problem: Pain Managment: Goal: General experience of comfort will improve 07/20/2021 1119 by Orvan Seen, RN Outcome: Completed/Met 07/20/2021 1000 by Orvan Seen, RN Outcome: Progressing   Problem: Safety: Goal: Ability to remain free from injury will improve 07/20/2021 1119 by Orvan Seen, RN Outcome: Completed/Met 07/20/2021 1000 by Orvan Seen, RN Outcome: Progressing   Problem: Skin Integrity: Goal: Risk for impaired skin integrity will decrease 07/20/2021 1119 by Orvan Seen, RN Outcome: Completed/Met 07/20/2021 1000 by Orvan Seen, RN Outcome: Progressing

## 2021-07-20 NOTE — Care Management (Signed)
Care Team discharged prior to patient being seen by [facility]  Outpatient referral for therapy sent to outpatient.

## 2021-07-20 NOTE — Plan of Care (Signed)

## 2021-07-20 NOTE — Progress Notes (Signed)
Patient discharged to home accompanied by family, wheeled out of unit by transport with all belongings. A+Ox4. No complaints of pain. Medications and discharge instructions reviewed. All questions answered. PIV x2 removed, no bleeding, intact. Patient agreed to follow up with all appointments as listed on AVS. Family will await call from [facility] for Swallow Study. Patient satisfied with overall care at [facility].

## 2021-07-23 ENCOUNTER — Encounter: Payer: Self-pay | Admitting: Gastroenterology

## 2021-07-25 DIAGNOSIS — K222 Esophageal obstruction: Secondary | ICD-10-CM | POA: Diagnosis not present

## 2021-07-25 DIAGNOSIS — I2510 Atherosclerotic heart disease of native coronary artery without angina pectoris: Secondary | ICD-10-CM | POA: Diagnosis not present

## 2021-07-25 DIAGNOSIS — J449 Chronic obstructive pulmonary disease, unspecified: Secondary | ICD-10-CM | POA: Diagnosis not present

## 2021-07-25 DIAGNOSIS — I5032 Chronic diastolic (congestive) heart failure: Secondary | ICD-10-CM | POA: Diagnosis not present

## 2021-08-02 DIAGNOSIS — I951 Orthostatic hypotension: Secondary | ICD-10-CM | POA: Diagnosis not present

## 2021-08-02 DIAGNOSIS — I2510 Atherosclerotic heart disease of native coronary artery without angina pectoris: Secondary | ICD-10-CM | POA: Diagnosis not present

## 2021-08-02 DIAGNOSIS — I5032 Chronic diastolic (congestive) heart failure: Secondary | ICD-10-CM | POA: Diagnosis not present

## 2021-08-02 DIAGNOSIS — D696 Thrombocytopenia, unspecified: Secondary | ICD-10-CM | POA: Diagnosis not present

## 2021-08-02 DIAGNOSIS — E7800 Pure hypercholesterolemia, unspecified: Secondary | ICD-10-CM | POA: Diagnosis not present

## 2021-08-02 DIAGNOSIS — R739 Hyperglycemia, unspecified: Secondary | ICD-10-CM | POA: Diagnosis not present

## 2021-08-06 DIAGNOSIS — E870 Hyperosmolality and hypernatremia: Secondary | ICD-10-CM | POA: Diagnosis not present

## 2021-08-06 DIAGNOSIS — J449 Chronic obstructive pulmonary disease, unspecified: Secondary | ICD-10-CM | POA: Diagnosis not present

## 2021-08-06 DIAGNOSIS — Z7952 Long term (current) use of systemic steroids: Secondary | ICD-10-CM | POA: Diagnosis not present

## 2021-08-06 DIAGNOSIS — Z7951 Long term (current) use of inhaled steroids: Secondary | ICD-10-CM | POA: Diagnosis not present

## 2021-08-06 DIAGNOSIS — K219 Gastro-esophageal reflux disease without esophagitis: Secondary | ICD-10-CM | POA: Diagnosis not present

## 2021-08-06 DIAGNOSIS — R1115 Cyclical vomiting syndrome unrelated to migraine: Secondary | ICD-10-CM | POA: Diagnosis not present

## 2021-08-06 DIAGNOSIS — Z7901 Long term (current) use of anticoagulants: Secondary | ICD-10-CM | POA: Diagnosis not present

## 2021-08-06 DIAGNOSIS — Z20822 Contact with and (suspected) exposure to covid-19: Secondary | ICD-10-CM | POA: Diagnosis not present

## 2021-08-06 DIAGNOSIS — Z6831 Body mass index (BMI) 31.0-31.9, adult: Secondary | ICD-10-CM | POA: Diagnosis not present

## 2021-08-06 DIAGNOSIS — I5032 Chronic diastolic (congestive) heart failure: Secondary | ICD-10-CM | POA: Diagnosis not present

## 2021-08-06 DIAGNOSIS — I700 Atherosclerosis of aorta: Secondary | ICD-10-CM | POA: Diagnosis not present

## 2021-08-06 DIAGNOSIS — I951 Orthostatic hypotension: Secondary | ICD-10-CM | POA: Diagnosis not present

## 2021-08-06 DIAGNOSIS — Z8673 Personal history of transient ischemic attack (TIA), and cerebral infarction without residual deficits: Secondary | ICD-10-CM | POA: Diagnosis not present

## 2021-08-06 DIAGNOSIS — F0390 Unspecified dementia without behavioral disturbance: Secondary | ICD-10-CM | POA: Diagnosis not present

## 2021-08-06 DIAGNOSIS — E440 Moderate protein-calorie malnutrition: Secondary | ICD-10-CM | POA: Diagnosis not present

## 2021-08-06 DIAGNOSIS — D696 Thrombocytopenia, unspecified: Secondary | ICD-10-CM | POA: Diagnosis not present

## 2021-08-06 DIAGNOSIS — Z86711 Personal history of pulmonary embolism: Secondary | ICD-10-CM | POA: Diagnosis not present

## 2021-08-06 DIAGNOSIS — R112 Nausea with vomiting, unspecified: Secondary | ICD-10-CM | POA: Diagnosis not present

## 2021-08-06 DIAGNOSIS — Z66 Do not resuscitate: Secondary | ICD-10-CM | POA: Diagnosis not present

## 2021-08-06 DIAGNOSIS — I2510 Atherosclerotic heart disease of native coronary artery without angina pectoris: Secondary | ICD-10-CM | POA: Diagnosis not present

## 2021-08-06 DIAGNOSIS — Z79899 Other long term (current) drug therapy: Secondary | ICD-10-CM | POA: Diagnosis not present

## 2021-08-06 DIAGNOSIS — K224 Dyskinesia of esophagus: Secondary | ICD-10-CM | POA: Diagnosis not present

## 2021-08-06 DIAGNOSIS — E785 Hyperlipidemia, unspecified: Secondary | ICD-10-CM | POA: Diagnosis not present

## 2021-08-06 DIAGNOSIS — Z8701 Personal history of pneumonia (recurrent): Secondary | ICD-10-CM | POA: Diagnosis not present

## 2021-08-06 DIAGNOSIS — I110 Hypertensive heart disease with heart failure: Secondary | ICD-10-CM | POA: Diagnosis not present

## 2021-08-06 DIAGNOSIS — G908 Other disorders of autonomic nervous system: Secondary | ICD-10-CM | POA: Diagnosis not present

## 2021-08-06 DIAGNOSIS — I2699 Other pulmonary embolism without acute cor pulmonale: Secondary | ICD-10-CM | POA: Diagnosis not present

## 2021-08-16 DIAGNOSIS — I5032 Chronic diastolic (congestive) heart failure: Secondary | ICD-10-CM | POA: Diagnosis not present

## 2021-08-16 DIAGNOSIS — I2510 Atherosclerotic heart disease of native coronary artery without angina pectoris: Secondary | ICD-10-CM | POA: Diagnosis not present

## 2021-08-16 DIAGNOSIS — R1319 Other dysphagia: Secondary | ICD-10-CM | POA: Diagnosis not present

## 2021-08-16 DIAGNOSIS — K224 Dyskinesia of esophagus: Secondary | ICD-10-CM | POA: Diagnosis not present

## 2021-08-21 DIAGNOSIS — H401131 Primary open-angle glaucoma, bilateral, mild stage: Secondary | ICD-10-CM | POA: Diagnosis not present

## 2021-08-21 DIAGNOSIS — H353131 Nonexudative age-related macular degeneration, bilateral, early dry stage: Secondary | ICD-10-CM | POA: Diagnosis not present

## 2021-08-21 DIAGNOSIS — H2513 Age-related nuclear cataract, bilateral: Secondary | ICD-10-CM | POA: Diagnosis not present

## 2021-09-04 ENCOUNTER — Telehealth: Payer: Self-pay

## 2021-09-04 ENCOUNTER — Other Ambulatory Visit
Admission: RE | Admit: 2021-09-04 | Discharge: 2021-09-04 | Disposition: A | Discharge status: Home / Self Care | Payer: PPO | Source: Ambulatory Visit | Attending: Family | Admitting: Family

## 2021-09-04 ENCOUNTER — Ambulatory Visit (HOSPITAL_BASED_OUTPATIENT_CLINIC_OR_DEPARTMENT_OTHER): Payer: PPO | Admitting: Family

## 2021-09-04 ENCOUNTER — Encounter: Payer: Self-pay | Admitting: Family

## 2021-09-04 VITALS — BP 129/57 | HR 64 | Resp 14 | Ht 69.0 in | Wt 225.4 lb

## 2021-09-04 DIAGNOSIS — Z7952 Long term (current) use of systemic steroids: Secondary | ICD-10-CM | POA: Insufficient documentation

## 2021-09-04 DIAGNOSIS — I5032 Chronic diastolic (congestive) heart failure: Secondary | ICD-10-CM | POA: Insufficient documentation

## 2021-09-04 DIAGNOSIS — I959 Hypotension, unspecified: Secondary | ICD-10-CM | POA: Insufficient documentation

## 2021-09-04 DIAGNOSIS — Z79899 Other long term (current) drug therapy: Secondary | ICD-10-CM | POA: Insufficient documentation

## 2021-09-04 DIAGNOSIS — I952 Hypotension due to drugs: Secondary | ICD-10-CM

## 2021-09-04 DIAGNOSIS — Z8673 Personal history of transient ischemic attack (TIA), and cerebral infarction without residual deficits: Secondary | ICD-10-CM | POA: Insufficient documentation

## 2021-09-04 DIAGNOSIS — R1310 Dysphagia, unspecified: Secondary | ICD-10-CM | POA: Insufficient documentation

## 2021-09-04 DIAGNOSIS — G9341 Metabolic encephalopathy: Secondary | ICD-10-CM | POA: Insufficient documentation

## 2021-09-04 DIAGNOSIS — J449 Chronic obstructive pulmonary disease, unspecified: Secondary | ICD-10-CM

## 2021-09-04 DIAGNOSIS — I2510 Atherosclerotic heart disease of native coronary artery without angina pectoris: Secondary | ICD-10-CM | POA: Insufficient documentation

## 2021-09-04 DIAGNOSIS — I739 Peripheral vascular disease, unspecified: Secondary | ICD-10-CM | POA: Insufficient documentation

## 2021-09-04 DIAGNOSIS — R600 Localized edema: Secondary | ICD-10-CM | POA: Insufficient documentation

## 2021-09-04 DIAGNOSIS — G4730 Sleep apnea, unspecified: Secondary | ICD-10-CM | POA: Insufficient documentation

## 2021-09-04 DIAGNOSIS — I110 Hypertensive heart disease with heart failure: Secondary | ICD-10-CM | POA: Insufficient documentation

## 2021-09-04 DIAGNOSIS — E785 Hyperlipidemia, unspecified: Secondary | ICD-10-CM | POA: Insufficient documentation

## 2021-09-04 DIAGNOSIS — K219 Gastro-esophageal reflux disease without esophagitis: Secondary | ICD-10-CM | POA: Insufficient documentation

## 2021-09-04 LAB — BASIC METABOLIC PANEL WITH GFR
Anion gap: 4 — ABNORMAL LOW (ref 5–15)
BUN: 14 mg/dL (ref 8–23)
CO2: 32 mmol/L (ref 22–32)
Calcium: 8.4 mg/dL — ABNORMAL LOW (ref 8.9–10.3)
Chloride: 109 mmol/L (ref 98–111)
Creatinine, Ser: 0.97 mg/dL (ref 0.61–1.24)
GFR, Estimated: >60 mL/min
Glucose, Bld: 102 mg/dL — ABNORMAL HIGH (ref 70–99)
Potassium: 4 mmol/L (ref 3.5–5.1)
Sodium: 145 mmol/L (ref 135–145)

## 2021-09-04 NOTE — Progress Notes (Signed)
Patient ID: Chris Kent, male    DOB: May 17, 1935, 86 y.o.   MRN: [id number]  HPI  Chris Kent is a 86 y.o. male with medical history significant for COPD followed by pulmonology, CAD, prior stroke, HTN, hyperlipidemia, anxiety, asthma, GERD, PVD, sleep apnea, burst fracture of thoracic vertebra October 2022.  Echo 02/21/21 - Left Ventricle: Left ventricular ejection fraction, by estimation, is 55 to 60%. The left ventricle has normal function. The left ventricle has no regional wall motion abnormalities. The left ventricular internal cavity size was normal in size. There is mild left ventricular hypertrophy. Left ventricular diastolic function could not be evaluated.   Admitted 08/06/21 nausea/ vomiting due to esophageal dysmotility. Discussion had regarding PEG tube. Discharged after 2 days. Admitted 07/16/21 due to emesis after he eats. CXR showed possible aspiration. Abd/pelvic CT negative. GI consult obtained. Discharged after 4 days. Admitted 02/24/21 due to acute on chronic heart failure along with pulmonary embolism. Was placed on IV heparin then transitioned to Eliquis. Required IV lasix twice. Metabolic encephalopathy, felt to have some underlying cognitive decline. Discharged home with [facility] PT after 3 days. Was in the ED 02/20/21 due to pedal edema where he was treated and released.   He presents today for a follow-up visit with a chief complaint of moderate fatigue with minimal exertion. Describes this as chronic in nature. He has associated fatigue, cough, pedal edema, difficulty sleeping and nausea/ vomiting along with this. He denies any abdominal distention, palpitations, chest pain, shortness of breath, dizziness or weight gain.   Is taking his diuretic/potassium PRN and typically takes it ~ every 3 days based on swelling in his legs. He's not weighing daily but does have working scales.   Has nausea/vomiting after eating/drinking most things. Has aspirated and family feels like he  continues to do so. Says that the only option is for a PEG tube and patient currently does not want this. Does drink 1 ensure nightly.   Past Medical History:  Diagnosis Date   Anxiety    Arthritis    Asthma    CHF (congestive heart failure) ([place])    Coronary artery disease    GERD (gastroesophageal reflux disease)    Glaucoma    Glaucoma    History of stroke    Hyperlipidemia    Hyperlipidemia    Hypertension    Peripheral vascular disease ([place])    Sleep apnea    Stroke [facility])    Past Surgical History:  Procedure Laterality Date   ESOPHAGOGASTRODUODENOSCOPY (EGD) WITH PROPOFOL N/A 07/19/2021   Procedure: ESOPHAGOGASTRODUODENOSCOPY (EGD) WITH PROPOFOL;  Surgeon: Annamaria Helling, DO;  Location: [facility] ENDOSCOPY;  Service: Gastroenterology;  Laterality: N/A;   REPLACEMENT TOTAL KNEE BILATERAL     Family History  Problem Relation Age of Onset   Prostate cancer Father    Social History   Tobacco Use   Smoking status: Never   Smokeless tobacco: Never  Substance Use Topics   Alcohol use: No   No Known Allergies  Prior to Admission medications   Medication Sig Start Date End Date Taking? Authorizing Provider  apixaban (ELIQUIS) 5 MG TABS tablet Take two tablets twice a day for six days then one tablet twice a day afterwards 02/26/21  Yes Wieting, Richard, MD  Cyanocobalamin (VITAMIN B 12) 500 MCG TABS Take 500 mcg by mouth daily. 02/26/21  Yes Wieting, Richard, MD  donepezil (ARICEPT) 5 MG tablet Take 5 mg by mouth daily. 07/03/21  Yes [provider]  Dorzolamide HCl-Timolol Mal PF 22.3-6.8 MG/ML SOLN Place 1 drop into the right eye 2 (two) times daily.   Yes [provider]  ergocalciferol (VITAMIN D2) 1.25 MG (50000 UT) capsule Take 1 capsule (50,000 Units total) by mouth once a week. 07/05/21  Yes Debany Vantol, Otila Kluver A, FNP  fludrocortisone (FLORINEF) 0.1 MG tablet Take 100 mcg by mouth daily. 06/27/21  Yes [provider]  latanoprost (XALATAN) 0.005 %  ophthalmic solution Place 1 drop into both eyes at bedtime.   Yes [provider]  metoprolol succinate (TOPROL-XL) 25 MG 24 hr tablet Take 12.5 mg by mouth daily. 06/03/21  Yes [provider]  midodrine (PROAMATINE) 10 MG tablet Take 10 mg by mouth 3 (three) times daily. 06/13/21  Yes [provider]  pantoprazole (PROTONIX) 40 MG tablet Take 1 tablet (40 mg total) by mouth daily. 07/20/21  Yes Sharen Hones, MD  potassium chloride SA (KLOR-CON M) 20 MEQ tablet Take 2 tablets (40 mEq total) by mouth daily. Patient taking differently: Take 40 mEq by mouth daily. Taking 2 tablets as needed on days when he takes diuretic as needed for swelling 07/05/21  Yes Reighlynn Swiney, Otila Kluver A, FNP  predniSONE (DELTASONE) 5 MG tablet Take 5 mg by mouth daily. 06/04/21  Yes [provider]  rosuvastatin (CRESTOR) 10 MG tablet Take 10 mg by mouth at bedtime.   Yes [provider]  tamsulosin (FLOMAX) 0.4 MG CAPS capsule Take 2 capsules (0.8 mg total) by mouth daily after supper. 10/31/20  Yes Bonnielee Haff, MD  torsemide (DEMADEX) 20 MG tablet Take 2 tablets po daily in am Patient taking differently: Take 20 mg by mouth daily. Take 2 tablets po daily in am Only taking as needed for swelling currently 02/27/21  Yes Wieting, Richard, MD  budesonide (PULMICORT) 0.5 MG/2ML nebulizer solution Take 2 mLs by nebulization as directed. Patient not taking: Reported on 09/04/2021 07/08/21   [provider]  cetirizine (ZYRTEC) 10 MG tablet Take 10 mg by mouth at bedtime. Patient not taking: Reported on 09/04/2021    [provider]  chlorpheniramine-HYDROcodone 10-8 MG/5ML Take 5 mLs by mouth every 12 (twelve) hours. Patient not taking: Reported on 09/04/2021 07/11/21   [provider]  sucralfate (CARAFATE) 1 GM/10ML suspension Take 10 mLs (1 g total) by mouth 4 (four) times daily -  with meals and at bedtime for 10 days. Patient not taking: Reported on 09/04/2021 07/20/21 07/30/21   Sharen Hones, MD  triamcinolone cream (KENALOG) 0.1 % Apply 1 application topically 3 (three) times daily. (Apply to rash on legs) Patient not taking: Reported on 09/04/2021    [provider]   Review of Systems  Constitutional:  Positive for fatigue. Negative for appetite change.  HENT:  Negative for congestion, postnasal drip and sore throat.   Eyes: Negative.   Respiratory:  Positive for cough (mostly after eating/drinking). Negative for shortness of breath.   Cardiovascular:  Positive for leg swelling. Negative for chest pain and palpitations.  Gastrointestinal:  Positive for nausea and vomiting. Negative for abdominal distention and abdominal pain.  Endocrine: Negative.   Genitourinary: Negative.   Musculoskeletal:  Negative for back pain and neck pain.  Skin: Negative.   Allergic/Immunologic: Negative.   Neurological:  Negative for dizziness, light-headedness and headaches.  Hematological:  Negative for adenopathy. Does not bruise/bleed easily.  Psychiatric/Behavioral:  Positive for sleep disturbance (sleeping in recliner). Negative for dysphoric mood. The patient is not nervous/anxious.     Vitals:   09/04/21  1030  BP: (!) 129/57  Pulse: 64  Resp: 14  SpO2: 97%  Weight: 225 lb 6 oz (102.2 kg)  Height: '5\' 9"'$  (1.753 m)   Wt Readings from Last 3 Encounters:  09/04/21 225 lb 6 oz (102.2 kg)  07/17/21 226 lb 15.8 oz (103 kg)  07/05/21 227 lb (103 kg)   Lab Results  Component Value Date   CREATININE 0.90 07/20/2021   CREATININE 0.95 07/19/2021   CREATININE 0.87 07/18/2021   Physical Exam Vitals and nursing note reviewed. Exam conducted with a chaperone present (grandson and SO).  Constitutional:      Appearance: Normal appearance.  HENT:     Head: Normocephalic and atraumatic.  Cardiovascular:     Rate and Rhythm: Normal rate and regular rhythm.  Pulmonary:     Effort: Pulmonary effort is normal. No respiratory distress.     Breath sounds: Rhonchi (LLL)  present. No wheezing or rales.  Abdominal:     General: There is no distension.     Palpations: Abdomen is soft.  Musculoskeletal:        General: No tenderness.     Cervical back: Normal range of motion and neck supple.     Right lower leg: Edema (3+ pitting) present.     Left lower leg: Edema (3+ pitting) present.  Skin:    General: Skin is warm and dry.  Neurological:     General: No focal deficit present.     Mental Status: He is alert and oriented to person, place, and time.  Psychiatric:        Mood and Affect: Mood normal.        Behavior: Behavior normal.        Thought Content: Thought content normal.    Assessment & Plan:  Heart failure with preserved ejection fraction with structural changes- - NYHA III - euvolemic today - not weighing daily but does have working scales; encouraged to resume daily weights so that he can call for an overnight weight gain of > 2 pounds or a weekly weight gain of > 5 pounds - weight down 2 pounds from last visit here 2 months ago - on GDMT of metoprolol - saw cardiology ([facility]) 07/10/21 - elevate legs when sitting for log periods and try to wear compression socks - edema could be from 3rd spacing from low albumin  - he will take his torsemide/potassium when getting home today - BNP 02/24/21 was 104.5  2. Hypotension - BP looks good (129/57) - saw PCP ([place]) 08/16/21 - BMP 08/16/21 reviewed and showed sodium 148, potassium 3.1, creatinine 1.1 and GFR 64 - repeat BMP today as he may need daily potassium supplement  3. Dysphagia/ esophageal dysmotility- - continues to have a chronic cough - n/v after eating/drinking - failed swallowing test and discussion had about PEG but patient defers at this time - albumin was 3.3 on 08/16/21  4: COPD- - saw pulmonology Raul Del) 06/04/21 - on chronic prednisone   Medication list reviewed.   Return in 3 months, sooner if needed.

## 2021-09-04 NOTE — Telephone Encounter (Addendum)
Reviewed labs and message below with patient. He verbalized understanding and stated he had no questions or concerns at this time.  Georg Ruddle, RN ----- Message from Alisa Graff, [place] sent at 09/04/2021 12:46 PM EDT ----- Sodium, potassium and kidney function are all normal. Continue medications as it and take the potassium only when taking the fluid pill.

## 2021-09-04 NOTE — Patient Instructions (Signed)
Continue weighing daily and call for an overnight weight gain of 3 pounds or more or a weekly weight gain of more than 5 pounds.  °

## 2021-10-01 DIAGNOSIS — R053 Chronic cough: Secondary | ICD-10-CM | POA: Diagnosis not present

## 2021-10-01 DIAGNOSIS — R0602 Shortness of breath: Secondary | ICD-10-CM | POA: Diagnosis not present

## 2021-10-01 DIAGNOSIS — J449 Chronic obstructive pulmonary disease, unspecified: Secondary | ICD-10-CM | POA: Diagnosis not present

## 2021-10-01 DIAGNOSIS — R918 Other nonspecific abnormal finding of lung field: Secondary | ICD-10-CM | POA: Diagnosis not present

## 2021-10-02 DIAGNOSIS — I951 Orthostatic hypotension: Secondary | ICD-10-CM | POA: Diagnosis not present

## 2021-10-02 DIAGNOSIS — I10 Essential (primary) hypertension: Secondary | ICD-10-CM | POA: Diagnosis not present

## 2021-10-02 DIAGNOSIS — D696 Thrombocytopenia, unspecified: Secondary | ICD-10-CM | POA: Diagnosis not present

## 2021-10-02 DIAGNOSIS — I2510 Atherosclerotic heart disease of native coronary artery without angina pectoris: Secondary | ICD-10-CM | POA: Diagnosis not present

## 2021-10-02 DIAGNOSIS — Z79899 Other long term (current) drug therapy: Secondary | ICD-10-CM | POA: Diagnosis not present

## 2021-10-02 DIAGNOSIS — R739 Hyperglycemia, unspecified: Secondary | ICD-10-CM | POA: Diagnosis not present

## 2021-10-02 DIAGNOSIS — E7800 Pure hypercholesterolemia, unspecified: Secondary | ICD-10-CM | POA: Diagnosis not present

## 2021-10-10 DIAGNOSIS — I2510 Atherosclerotic heart disease of native coronary artery without angina pectoris: Secondary | ICD-10-CM | POA: Diagnosis not present

## 2021-10-10 DIAGNOSIS — J449 Chronic obstructive pulmonary disease, unspecified: Secondary | ICD-10-CM | POA: Diagnosis not present

## 2021-10-10 DIAGNOSIS — I951 Orthostatic hypotension: Secondary | ICD-10-CM | POA: Diagnosis not present

## 2021-10-10 DIAGNOSIS — I5032 Chronic diastolic (congestive) heart failure: Secondary | ICD-10-CM | POA: Diagnosis not present

## 2021-10-10 DIAGNOSIS — I10 Essential (primary) hypertension: Secondary | ICD-10-CM | POA: Diagnosis not present

## 2021-10-10 DIAGNOSIS — E782 Mixed hyperlipidemia: Secondary | ICD-10-CM | POA: Diagnosis not present

## 2021-10-13 DIAGNOSIS — J449 Chronic obstructive pulmonary disease, unspecified: Secondary | ICD-10-CM | POA: Diagnosis not present

## 2021-10-25 DIAGNOSIS — F0390 Unspecified dementia without behavioral disturbance: Secondary | ICD-10-CM | POA: Diagnosis not present

## 2021-10-25 DIAGNOSIS — E785 Hyperlipidemia, unspecified: Secondary | ICD-10-CM | POA: Diagnosis not present

## 2021-10-25 DIAGNOSIS — E46 Unspecified protein-calorie malnutrition: Secondary | ICD-10-CM | POA: Diagnosis not present

## 2021-10-25 DIAGNOSIS — I509 Heart failure, unspecified: Secondary | ICD-10-CM | POA: Diagnosis not present

## 2021-10-25 DIAGNOSIS — Z6833 Body mass index (BMI) 33.0-33.9, adult: Secondary | ICD-10-CM | POA: Diagnosis not present

## 2021-10-25 DIAGNOSIS — J449 Chronic obstructive pulmonary disease, unspecified: Secondary | ICD-10-CM | POA: Diagnosis not present

## 2021-10-30 ENCOUNTER — Ambulatory Visit
Admission: RE | Admit: 2021-10-30 | Discharge: 2021-10-30 | Disposition: A | Discharge status: Home / Self Care | Payer: Self-pay | Source: Ambulatory Visit | Attending: Orthopedic Surgery | Admitting: Orthopedic Surgery

## 2021-10-30 ENCOUNTER — Other Ambulatory Visit: Payer: Self-pay

## 2021-10-30 DIAGNOSIS — Z049 Encounter for examination and observation for unspecified reason: Secondary | ICD-10-CM

## 2021-10-31 DIAGNOSIS — R1310 Dysphagia, unspecified: Secondary | ICD-10-CM | POA: Diagnosis not present

## 2021-10-31 DIAGNOSIS — I509 Heart failure, unspecified: Secondary | ICD-10-CM | POA: Diagnosis not present

## 2021-11-05 DIAGNOSIS — R053 Chronic cough: Secondary | ICD-10-CM | POA: Diagnosis not present

## 2021-11-05 DIAGNOSIS — R112 Nausea with vomiting, unspecified: Secondary | ICD-10-CM | POA: Diagnosis not present

## 2021-11-05 DIAGNOSIS — R1319 Other dysphagia: Secondary | ICD-10-CM | POA: Diagnosis not present

## 2021-11-05 DIAGNOSIS — K220 Achalasia of cardia: Secondary | ICD-10-CM | POA: Diagnosis not present

## 2021-11-05 DIAGNOSIS — R0989 Other specified symptoms and signs involving the circulatory and respiratory systems: Secondary | ICD-10-CM | POA: Diagnosis not present

## 2021-11-05 DIAGNOSIS — K222 Esophageal obstruction: Secondary | ICD-10-CM | POA: Diagnosis not present

## 2021-11-05 DIAGNOSIS — K224 Dyskinesia of esophagus: Secondary | ICD-10-CM | POA: Diagnosis not present

## 2021-11-05 DIAGNOSIS — I5032 Chronic diastolic (congestive) heart failure: Secondary | ICD-10-CM | POA: Diagnosis not present

## 2021-11-08 DIAGNOSIS — S299XXA Unspecified injury of thorax, initial encounter: Secondary | ICD-10-CM | POA: Diagnosis not present

## 2021-11-08 DIAGNOSIS — R0781 Pleurodynia: Secondary | ICD-10-CM | POA: Diagnosis not present

## 2021-11-12 DIAGNOSIS — J449 Chronic obstructive pulmonary disease, unspecified: Secondary | ICD-10-CM | POA: Diagnosis not present

## 2021-11-14 ENCOUNTER — Other Ambulatory Visit: Payer: Self-pay | Admitting: Family Medicine

## 2021-11-14 ENCOUNTER — Ambulatory Visit
Admission: RE | Admit: 2021-11-14 | Discharge: 2021-11-14 | Disposition: A | Discharge status: Home / Self Care | Payer: PPO | Source: Ambulatory Visit | Attending: Family Medicine | Admitting: Family Medicine

## 2021-11-14 DIAGNOSIS — W19XXXS Unspecified fall, sequela: Secondary | ICD-10-CM | POA: Diagnosis not present

## 2021-11-14 DIAGNOSIS — S2232XA Fracture of one rib, left side, initial encounter for closed fracture: Secondary | ICD-10-CM | POA: Diagnosis not present

## 2021-11-14 DIAGNOSIS — R0789 Other chest pain: Secondary | ICD-10-CM | POA: Diagnosis not present

## 2021-11-14 DIAGNOSIS — Y92009 Unspecified place in unspecified non-institutional (private) residence as the place of occurrence of the external cause: Secondary | ICD-10-CM

## 2021-11-14 DIAGNOSIS — R0602 Shortness of breath: Secondary | ICD-10-CM

## 2021-11-14 DIAGNOSIS — I700 Atherosclerosis of aorta: Secondary | ICD-10-CM | POA: Diagnosis not present

## 2021-11-14 DIAGNOSIS — S2232XS Fracture of one rib, left side, sequela: Secondary | ICD-10-CM | POA: Insufficient documentation

## 2021-11-14 DIAGNOSIS — W010XXD Fall on same level from slipping, tripping and stumbling without subsequent striking against object, subsequent encounter: Secondary | ICD-10-CM | POA: Diagnosis not present

## 2021-11-14 DIAGNOSIS — R911 Solitary pulmonary nodule: Secondary | ICD-10-CM | POA: Insufficient documentation

## 2021-11-19 ENCOUNTER — Other Ambulatory Visit: Payer: Self-pay | Admitting: Family Medicine

## 2021-11-19 ENCOUNTER — Other Ambulatory Visit (HOSPITAL_COMMUNITY): Payer: Self-pay | Admitting: Family Medicine

## 2021-11-19 ENCOUNTER — Ambulatory Visit
Admission: RE | Admit: 2021-11-19 | Discharge: 2021-11-19 | Disposition: A | Discharge status: Home / Self Care | Payer: PPO | Source: Ambulatory Visit | Attending: Family Medicine | Admitting: Family Medicine

## 2021-11-19 DIAGNOSIS — I3139 Other pericardial effusion (noninflammatory): Secondary | ICD-10-CM | POA: Diagnosis not present

## 2021-11-19 DIAGNOSIS — S2232XA Fracture of one rib, left side, initial encounter for closed fracture: Secondary | ICD-10-CM | POA: Diagnosis not present

## 2021-11-19 DIAGNOSIS — R0789 Other chest pain: Secondary | ICD-10-CM | POA: Diagnosis not present

## 2021-11-19 DIAGNOSIS — S2232XG Fracture of one rib, left side, subsequent encounter for fracture with delayed healing: Secondary | ICD-10-CM | POA: Insufficient documentation

## 2021-11-19 DIAGNOSIS — R634 Abnormal weight loss: Secondary | ICD-10-CM | POA: Diagnosis not present

## 2021-11-19 DIAGNOSIS — R918 Other nonspecific abnormal finding of lung field: Secondary | ICD-10-CM | POA: Diagnosis not present

## 2021-11-19 DIAGNOSIS — R911 Solitary pulmonary nodule: Secondary | ICD-10-CM

## 2021-11-19 MED ORDER — IOHEXOL 300 MG/ML  SOLN
75.0000 mL | Freq: Once | INTRAMUSCULAR | Status: AC | PRN
  Administered 2021-11-19: 75 mL via INTRAVENOUS

## 2021-11-26 ENCOUNTER — Other Ambulatory Visit: Payer: Self-pay

## 2021-11-28 ENCOUNTER — Other Ambulatory Visit: Payer: Self-pay

## 2021-11-28 DIAGNOSIS — Z981 Arthrodesis status: Secondary | ICD-10-CM

## 2021-11-28 NOTE — Progress Notes (Unsigned)
   REFERRING PHYSICIAN:  Idelle Crouch, Md [address],  [place] [id number]  DOS: T9-12 PSF 11/27/20 with Dr. [name]   HISTORY OF PRESENT ILLNESS: Chris Kent was last seen by Danelle on 05/29/21. He was doing well. He was to wean out of TLSO brace and resume activity as tolerated.   He is here for follow up and repeat xrays.   He is doing well regarding his back. No pain in his back. No pain in his arms or legs.   Had a recent fall with left sided rib fracture. This is hurting him, especially when he coughs.   He is on ELIQUIS.   PHYSICAL EXAMINATION:  General: Patient is well developed, well nourished, calm, collected, and in no apparent distress.   NEUROLOGICAL:  General: In no acute distress.   Awake, alert, oriented to person, place, and time.  Pupils equal round and reactive to light.  Facial tone is symmetric.     Strength:            Side Iliopsoas Quads Hamstring PF DF EHL  R '5 5 5 5 5 5  '$ L '5 5 5 5 5 5   '$ No thoracolumbar tenderness noted.   He  has left sided tenderness over ribs.    ROS (Neurologic):  Negative except as noted above  IMAGING: Xrays of thoracolumbar spine dated 11/29/21: Hardware is intact. No evidence of segmental kyphosis or hardware pullout.  Radiology report not available for my review.   ASSESSMENT/PLAN:  Chris Kent is doing well s/p above surgery. Current pain is from left rib fracture.Treatment options reviewed with patient and following plan made:   - Continue with activity as tolerated.  - Avoid heavy lifting.  - He will f/u prn.   Advised to contact the office if any questions or concerns arise.  Geronimo Boot PA-C Department of neurosurgery

## 2021-11-29 ENCOUNTER — Ambulatory Visit
Admission: RE | Admit: 2021-11-29 | Discharge: 2021-11-29 | Disposition: A | Discharge status: Home / Self Care | Payer: PPO | Source: Ambulatory Visit | Attending: Orthopedic Surgery | Admitting: Orthopedic Surgery

## 2021-11-29 ENCOUNTER — Ambulatory Visit
Admission: RE | Admit: 2021-11-29 | Discharge: 2021-11-29 | Disposition: A | Discharge status: Home / Self Care | Payer: PPO | Attending: Orthopedic Surgery | Admitting: Orthopedic Surgery

## 2021-11-29 ENCOUNTER — Ambulatory Visit: Payer: PPO | Admitting: Orthopedic Surgery

## 2021-11-29 ENCOUNTER — Encounter: Payer: Self-pay | Admitting: Orthopedic Surgery

## 2021-11-29 VITALS — BP 116/72 | Ht 70.0 in | Wt 205.0 lb

## 2021-11-29 DIAGNOSIS — Z981 Arthrodesis status: Secondary | ICD-10-CM | POA: Diagnosis not present

## 2021-11-29 DIAGNOSIS — Z043 Encounter for examination and observation following other accident: Secondary | ICD-10-CM | POA: Diagnosis not present

## 2021-11-29 DIAGNOSIS — M455 Ankylosing spondylitis of thoracolumbar region: Secondary | ICD-10-CM | POA: Insufficient documentation

## 2021-11-29 DIAGNOSIS — S2232XA Fracture of one rib, left side, initial encounter for closed fracture: Secondary | ICD-10-CM | POA: Diagnosis not present

## 2021-12-01 ENCOUNTER — Emergency Department: Payer: PPO

## 2021-12-01 ENCOUNTER — Emergency Department
Admission: EM | Admit: 2021-12-01 | Discharge: 2021-12-01 | Disposition: A | Discharge status: Home / Self Care | Payer: PPO | Attending: Emergency Medicine | Admitting: Emergency Medicine

## 2021-12-01 ENCOUNTER — Other Ambulatory Visit: Payer: Self-pay

## 2021-12-01 ENCOUNTER — Encounter: Payer: Self-pay | Admitting: Emergency Medicine

## 2021-12-01 DIAGNOSIS — S52501A Unspecified fracture of the lower end of right radius, initial encounter for closed fracture: Secondary | ICD-10-CM

## 2021-12-01 DIAGNOSIS — S52502A Unspecified fracture of the lower end of left radius, initial encounter for closed fracture: Secondary | ICD-10-CM | POA: Insufficient documentation

## 2021-12-01 DIAGNOSIS — Z7901 Long term (current) use of anticoagulants: Secondary | ICD-10-CM | POA: Insufficient documentation

## 2021-12-01 DIAGNOSIS — S199XXA Unspecified injury of neck, initial encounter: Secondary | ICD-10-CM | POA: Diagnosis not present

## 2021-12-01 DIAGNOSIS — W07XXXA Fall from chair, initial encounter: Secondary | ICD-10-CM | POA: Diagnosis not present

## 2021-12-01 DIAGNOSIS — M47812 Spondylosis without myelopathy or radiculopathy, cervical region: Secondary | ICD-10-CM | POA: Insufficient documentation

## 2021-12-01 DIAGNOSIS — I739 Peripheral vascular disease, unspecified: Secondary | ICD-10-CM | POA: Diagnosis not present

## 2021-12-01 DIAGNOSIS — S0990XA Unspecified injury of head, initial encounter: Secondary | ICD-10-CM | POA: Insufficient documentation

## 2021-12-01 DIAGNOSIS — R600 Localized edema: Secondary | ICD-10-CM | POA: Diagnosis not present

## 2021-12-01 DIAGNOSIS — G319 Degenerative disease of nervous system, unspecified: Secondary | ICD-10-CM | POA: Diagnosis not present

## 2021-12-01 DIAGNOSIS — S6992XA Unspecified injury of left wrist, hand and finger(s), initial encounter: Secondary | ICD-10-CM | POA: Diagnosis present

## 2021-12-01 MED ORDER — OXYCODONE-ACETAMINOPHEN 5-325 MG PO TABS: 1.0000 | ORAL_TABLET | Freq: Four times a day (QID) | ORAL | 0 refills | Status: AC | PRN

## 2021-12-01 MED ORDER — ONDANSETRON 4 MG PO TBDP
4.0000 mg | ORAL_TABLET | Freq: Once | ORAL | Status: AC
  Administered 2021-12-01: 4 mg via ORAL
  Filled 2021-12-01: qty 1

## 2021-12-01 MED ORDER — ONDANSETRON 4 MG PO TBDP: 4.0000 mg | ORAL_TABLET | Freq: Three times a day (TID) | ORAL | 0 refills | Status: AC | PRN

## 2021-12-01 MED ORDER — OXYCODONE-ACETAMINOPHEN 5-325 MG PO TABS
1.0000 | ORAL_TABLET | Freq: Once | ORAL | Status: AC
  Administered 2021-12-01: 1 via ORAL
  Filled 2021-12-01: qty 1

## 2021-12-01 NOTE — ED Provider Notes (Signed)
[facility] Provider Note  Patient Contact: 7:38 PM (approximate)   History   Fall and Wrist Pain   HPI  Chris Kent is a 86 y.o. male presents to the emergency department after a fall on an outstretched left hand. Patient states that he tripped on a chair.  He denies hitting his head but is currently anticoagulated on Eliquis.  No chest pain, chest tightness or abdominal pain.  No numbness or tingling in the upper and lower extremities.   Physical Exam   Triage Vital Signs: ED Triage Vitals  Enc Vitals Group     BP 12/01/21 1836 (!) 119/58     Pulse Rate 12/01/21 1836 73     Resp 12/01/21 1836 20     Temp 12/01/21 1836 97.9 F (36.6 C)     Temp src --      SpO2 12/01/21 1836 96 %     Weight --      Height --      Head Circumference --      Peak Flow --      Pain Score 12/01/21 1832 6     Pain Loc --      Pain Edu? --      Excl. in [place]? --     Most recent vital signs: Vitals:   12/01/21 1836 12/01/21 2127  BP: (!) 119/58 (!) 110/53  Pulse: 73 74  Resp: 20 16  Temp: 97.9 F (36.6 C) 98 F (36.7 C)  SpO2: 96% 99%     General: Alert and in no acute distress. Eyes:  PERRL. EOMI. Head: No acute traumatic findings ENT:      Nose: No congestion/rhinnorhea.      Mouth/Throat: Mucous membranes are moist. Neck: No stridor. No cervical spine tenderness to palpation. Cardiovascular:  Good peripheral perfusion Respiratory: Normal respiratory effort without tachypnea or retractions. Lungs CTAB. Good air entry to the bases with no decreased or absent breath sounds. Gastrointestinal: Bowel sounds 4 quadrants. Soft and nontender to palpation. No guarding or rigidity. No palpable masses. No distention. No CVA tenderness. Musculoskeletal: Patient performs limited range of motion at the left wrist due to pain.  Palpable radial and ulnar pulses bilaterally and symmetrically. Neurologic:  No gross focal neurologic deficits are appreciated.  Skin:    No rash noted Other:   ED Results / Procedures / Treatments   Labs (all labs ordered are listed, but only abnormal results are displayed) Labs Reviewed - No data to display      RADIOLOGY  I personally viewed and evaluated these images as part of my medical decision making, as well as reviewing the written report by the radiologist.  ED Provider Interpretation: Patient has minimally displaced, likely comminuted distal radius fracture with ulnar styloid fracture.   PROCEDURES:  Critical Care performed: No  Procedures   MEDICATIONS ORDERED IN ED: Medications  oxyCODONE-acetaminophen (PERCOCET/ROXICET) 5-325 MG per tablet 1 tablet (1 tablet Oral Given 12/01/21 1953)  ondansetron (ZOFRAN-ODT) disintegrating tablet 4 mg (4 mg Oral Given 12/01/21 1953)     IMPRESSION / MDM / ASSESSMENT AND PLAN / ED COURSE  I reviewed the triage vital signs and the nursing notes.                              Assessment and plan:  Fall:  86 year old male presents to the emergency department with acute right wrist pain after mechanical fall.  Vital signs were reassuring at triage.  On exam, patient was alert, active and nontoxic-appearing.   X-ray indicates and a comminuted nondisplaced distal radius fracture with ulnar styloid fracture.  Patient was placed in a sugar-tong splint.  CT head and CT cervical spine showed no evidence of intracranial bleed or skull fracture.  Patient was prescribed a short course of Percocet for pain.  He is accompanied by his grandchildren whom he lives with.  Patient was given follow-up referral to Dr. Omer Jack.  Return precautions were given to return with new or worsening symptoms   FINAL CLINICAL IMPRESSION(S) / ED DIAGNOSES   Final diagnoses:  Closed fracture of distal end of right radius, unspecified fracture morphology, initial encounter     Rx / DC Orders   ED Discharge Orders          Ordered    oxyCODONE-acetaminophen (PERCOCET/ROXICET)  5-325 MG tablet  Every 6 hours PRN        12/01/21 2113    ondansetron (ZOFRAN-ODT) 4 MG disintegrating tablet  Every 8 hours PRN        12/01/21 2113             Note:  This document was prepared using Dragon voice recognition software and may include unintentional dictation errors.   Vallarie Mare [place], PA-C 12/01/21 2325    Rada Hay, MD 12/03/21 [phone]

## 2021-12-01 NOTE — Discharge Instructions (Signed)
Take Percocet for pain. Please take stool softener daily.   Please make follow-up appointment with Dr. Leim Fabry.

## 2021-12-01 NOTE — ED Notes (Signed)
Sugar tong splint placed on left arm. Neuro/circulation check intact

## 2021-12-01 NOTE — ED Triage Notes (Signed)
Pt to ED via POV for fall. Pt has injury to his left wrist. Pt states that he did not hit his head. Pt states that he tripped over a chair. Pt states that he takes Eliquis.

## 2021-12-03 ENCOUNTER — Encounter (INDEPENDENT_AMBULATORY_CARE_PROVIDER_SITE_OTHER): Payer: Self-pay

## 2021-12-04 DIAGNOSIS — S52502A Unspecified fracture of the lower end of left radius, initial encounter for closed fracture: Secondary | ICD-10-CM | POA: Diagnosis not present

## 2021-12-04 DIAGNOSIS — I5030 Unspecified diastolic (congestive) heart failure: Secondary | ICD-10-CM | POA: Diagnosis not present

## 2021-12-04 DIAGNOSIS — D692 Other nonthrombocytopenic purpura: Secondary | ICD-10-CM | POA: Diagnosis not present

## 2021-12-04 DIAGNOSIS — S42302D Unspecified fracture of shaft of humerus, left arm, subsequent encounter for fracture with routine healing: Secondary | ICD-10-CM | POA: Diagnosis not present

## 2021-12-04 DIAGNOSIS — E46 Unspecified protein-calorie malnutrition: Secondary | ICD-10-CM | POA: Diagnosis not present

## 2021-12-04 DIAGNOSIS — K224 Dyskinesia of esophagus: Secondary | ICD-10-CM | POA: Diagnosis not present

## 2021-12-04 DIAGNOSIS — W19XXXD Unspecified fall, subsequent encounter: Secondary | ICD-10-CM | POA: Diagnosis not present

## 2021-12-04 DIAGNOSIS — Z6830 Body mass index (BMI) 30.0-30.9, adult: Secondary | ICD-10-CM | POA: Diagnosis not present

## 2021-12-06 DIAGNOSIS — S52502A Unspecified fracture of the lower end of left radius, initial encounter for closed fracture: Secondary | ICD-10-CM | POA: Diagnosis not present

## 2021-12-09 ENCOUNTER — Other Ambulatory Visit: Payer: Self-pay | Admitting: Family

## 2021-12-13 DIAGNOSIS — J449 Chronic obstructive pulmonary disease, unspecified: Secondary | ICD-10-CM | POA: Diagnosis not present

## 2021-12-14 DIAGNOSIS — E538 Deficiency of other specified B group vitamins: Secondary | ICD-10-CM | POA: Diagnosis not present

## 2021-12-14 DIAGNOSIS — Z87898 Personal history of other specified conditions: Secondary | ICD-10-CM | POA: Diagnosis not present

## 2021-12-14 DIAGNOSIS — Z8673 Personal history of transient ischemic attack (TIA), and cerebral infarction without residual deficits: Secondary | ICD-10-CM | POA: Diagnosis not present

## 2021-12-14 DIAGNOSIS — I951 Orthostatic hypotension: Secondary | ICD-10-CM | POA: Diagnosis not present

## 2021-12-14 DIAGNOSIS — D649 Anemia, unspecified: Secondary | ICD-10-CM | POA: Diagnosis not present

## 2021-12-14 DIAGNOSIS — Z79899 Other long term (current) drug therapy: Secondary | ICD-10-CM | POA: Diagnosis not present

## 2021-12-17 DIAGNOSIS — S52502D Unspecified fracture of the lower end of left radius, subsequent encounter for closed fracture with routine healing: Secondary | ICD-10-CM | POA: Diagnosis not present

## 2021-12-17 NOTE — Progress Notes (Unsigned)
Patient ID: Chris Kent, male    DOB: July 31, 1935, 86 y.o.   MRN: [id number]  HPI  Chris Kent is a 86 y.o. male with medical history significant for COPD followed by pulmonology, CAD, prior stroke, HTN, hyperlipidemia, anxiety, asthma, GERD, PVD, sleep apnea, burst fracture of thoracic vertebra October 2022.  Echo 02/21/21 - Left Ventricle: Left ventricular ejection fraction, by estimation, is 55 to 60%. The left ventricle has normal function. The left ventricle has no regional wall motion abnormalities. The left ventricular internal cavity size was normal in size. There is mild left ventricular hypertrophy. Left ventricular diastolic function could not be evaluated.   Was in the ED 12/01/21 due to mechanical fall. X-ray indicates and a comminuted nondisplaced distal radius fracture with ulnar styloid fracture.  Patient was placed in a sugar-tong splint. CT head and CT cervical spine showed no evidence of intracranial bleed or skull fracture. Admitted 08/06/21 nausea/ vomiting due to esophageal dysmotility. Discussion had regarding PEG tube. Discharged after 2 days. Admitted 07/16/21 due to emesis after he eats. CXR showed possible aspiration. Abd/pelvic CT negative. GI consult obtained. Discharged after 4 days.   He presents today for a follow-up visit with a chief complaint of moderate fatigue with minimal exertion. Describes this as chronic in nature. He has associated cough (worse after eating/drinking), shortness of breath, pedal edema (improving), left arm pain, N/V, chronic difficulty sleeping and weight loss along with this. He denies any dizziness, abdominal distention, palpitations or chest pain.   Currently has a cast on his left forearm due to recent fracture.   Substantial weight loss since last here due to worsening, n/v/cough after eating/ drinking.   Past Medical History:  Diagnosis Date   Anxiety    Arthritis    Asthma    CHF (congestive heart failure) ([place])    Coronary  artery disease    GERD (gastroesophageal reflux disease)    Glaucoma    Glaucoma    History of stroke    Hyperlipidemia    Hyperlipidemia    Hypertension    Peripheral vascular disease ([place])    Sleep apnea    Stroke [facility])    Past Surgical History:  Procedure Laterality Date   ESOPHAGOGASTRODUODENOSCOPY (EGD) WITH PROPOFOL N/A 07/19/2021   Procedure: ESOPHAGOGASTRODUODENOSCOPY (EGD) WITH PROPOFOL;  Surgeon: Annamaria Helling, DO;  Location: [facility] ENDOSCOPY;  Service: Gastroenterology;  Laterality: N/A;   REPLACEMENT TOTAL KNEE BILATERAL     Family History  Problem Relation Age of Onset   Prostate cancer Father    Social History   Tobacco Use   Smoking status: Never   Smokeless tobacco: Never  Substance Use Topics   Alcohol use: No   No Known Allergies  Prior to Admission medications   Medication Sig Start Date End Date Taking? Authorizing Provider  apixaban (ELIQUIS) 5 MG TABS tablet Take two tablets twice a day for six days then one tablet twice a day afterwards Patient taking differently: Take 5 mg by mouth 2 (two) times daily. 02/26/21  Yes Wieting, Richard, MD  calcium carbonate (OSCAL) 1500 (600 Ca) MG TABS tablet Take 600 mg of elemental calcium by mouth daily with breakfast.   Yes [provider]  Cyanocobalamin (VITAMIN B 12) 500 MCG TABS Take 500 mcg by mouth daily. 02/26/21  Yes Wieting, Richard, MD  Dorzolamide HCl-Timolol Mal PF 22.3-6.8 MG/ML SOLN Place 1 drop into the right eye 2 (two) times daily.   Yes [provider]  ergocalciferol (VITAMIN  D2) 1.25 MG (50000 UT) capsule Take 1 capsule (50,000 Units total) by mouth once a week. Patient taking differently: Take 50,000 Units by mouth once a week. Saturday mornings 07/05/21  Yes Timarion Agcaoili, Otila Kluver A, FNP  ferrous sulfate 324 MG TBEC Take 324 mg by mouth daily with breakfast.   Yes [provider]  fludrocortisone (FLORINEF) 0.1 MG tablet Take 100 mcg by mouth daily. 06/27/21  Yes [provider]  latanoprost (XALATAN) 0.005 % ophthalmic solution Place 1 drop into both eyes at bedtime.   Yes [provider]  metoprolol succinate (TOPROL-XL) 25 MG 24 hr tablet Take 12.5 mg by mouth daily. 06/03/21  Yes [provider]  midodrine (PROAMATINE) 10 MG tablet Take 10 mg by mouth 3 (three) times daily. 06/13/21  Yes [provider]  pantoprazole (PROTONIX) 40 MG tablet Take 1 tablet (40 mg total) by mouth daily. 07/20/21  Yes Sharen Hones, MD  potassium chloride SA (KLOR-CON M) 20 MEQ tablet Take 2 tablets (40 mEq total) by mouth daily. Patient taking differently: Take 60 mEq by mouth daily. Taking 3 tablets 07/05/21  Yes Rakin Lemelle A, FNP  pyridostigmine (MESTINON) 60 MG tablet Take 60 mg by mouth 3 (three) times daily.   Yes [provider]  rosuvastatin (CRESTOR) 10 MG tablet Take 10 mg by mouth at bedtime.   Yes [provider]  torsemide (DEMADEX) 20 MG tablet Take 2 tablets po daily in am Patient taking differently: Take 10 mg by mouth daily. 02/27/21  Yes Loletha Grayer, MD    Review of Systems  Constitutional:  Positive for fatigue. Negative for appetite change.  HENT:  Negative for congestion, postnasal drip and sore throat.   Eyes: Negative.   Respiratory:  Positive for cough (mostly after eating/drinking) and shortness of breath.   Cardiovascular:  Positive for leg swelling. Negative for chest pain and palpitations.  Gastrointestinal:  Positive for nausea and vomiting. Negative for abdominal distention and abdominal pain.  Endocrine: Negative.   Genitourinary: Negative.   Musculoskeletal:  Positive for arthralgias (left wrist due to fracture). Negative for back pain and neck pain.  Skin: Negative.   Allergic/Immunologic: Negative.   Neurological:  Negative for dizziness, light-headedness and headaches.  Hematological:  Negative for adenopathy. Does not bruise/bleed easily.  Psychiatric/Behavioral:  Positive for sleep  disturbance (sleeping in recliner). Negative for dysphoric mood. The patient is not nervous/anxious.    Vitals:   12/18/21 0929  BP: (!) 118/59  Pulse: 60  Resp: 18  SpO2: 96%  Weight: 200 lb 2 oz (90.8 kg)   Wt Readings from Last 3 Encounters:  12/18/21 200 lb 2 oz (90.8 kg)  11/29/21 205 lb (93 kg)  09/04/21 225 lb 6 oz (102.2 kg)   Lab Results  Component Value Date   CREATININE 0.97 09/04/2021   CREATININE 0.90 07/20/2021   CREATININE 0.95 07/19/2021   Physical Exam Vitals and nursing note reviewed. Exam conducted with a chaperone present (grandson and SO).  Constitutional:      Appearance: Normal appearance.  HENT:     Head: Normocephalic and atraumatic.  Cardiovascular:     Rate and Rhythm: Normal rate and regular rhythm.  Pulmonary:     Effort: Pulmonary effort is normal. No respiratory distress.     Breath sounds: No wheezing, rhonchi or rales.  Abdominal:     General: There is no distension.     Palpations: Abdomen is soft.  Musculoskeletal:        General:  No tenderness.     Cervical back: Normal range of motion and neck supple.     Right lower leg: Edema (trace pitting) present.     Left lower leg: Edema (trace pitting) present.  Skin:    General: Skin is warm and dry.  Neurological:     General: No focal deficit present.     Mental Status: He is alert and oriented to person, place, and time.  Psychiatric:        Mood and Affect: Mood normal.        Behavior: Behavior normal.        Thought Content: Thought content normal.    Assessment & Plan:  Heart failure with preserved ejection fraction with structural changes- - NYHA III - euvolemic today - not weighing daily but does have working scales; encouraged to resume daily weights so that he can call for an overnight weight gain of > 2 pounds or a weekly weight gain of > 5 pounds - weight down 25 pounds from last visit here 3 months ago - on GDMT of metoprolol - saw cardiology ([place]) 10/10/21 -  elevate legs when sitting for log periods and try to wear compression socks - edema much improved from last visit; torsemide has been reduced in half by PCP - BNP 02/24/21 was 104.5 - PharmD reconciled medications with the patient - reports receiving his flu vaccine for this season  2. Hypotension - BP looks good (118/59) - saw PCP ([place]) 12/14/21; torsemide decrease to 1/2 tablet daily; trial of pyridostigmine begun - BMP 12/14/21 reviewed and showed sodium 145, potassium 3.0, creatinine 1.2 and GFR 60  3. Dysphagia/ esophageal dysmotility- - continues to have a chronic cough - n/v after eating/drinking - failed previous swallowing test and discussion had about PEG but patient defers at this time - albumin was 3.2 on 12/14/21 - substantial weight loss  4: COPD- - saw pulmonology Raul Del) 10/01/21   Medication list reviewed.   Return in 3 months, sooner if needed. Offered to not make another appointment and come PRN but patient would like to return one more time before making it PRN.

## 2021-12-18 ENCOUNTER — Other Ambulatory Visit (HOSPITAL_COMMUNITY): Payer: Self-pay

## 2021-12-18 ENCOUNTER — Encounter: Payer: Self-pay | Admitting: Family

## 2021-12-18 ENCOUNTER — Ambulatory Visit: Payer: PPO | Attending: Family | Admitting: Family

## 2021-12-18 VITALS — BP 118/59 | HR 60 | Resp 18 | Wt 200.1 lb

## 2021-12-18 DIAGNOSIS — I5032 Chronic diastolic (congestive) heart failure: Secondary | ICD-10-CM | POA: Diagnosis not present

## 2021-12-18 DIAGNOSIS — I739 Peripheral vascular disease, unspecified: Secondary | ICD-10-CM | POA: Insufficient documentation

## 2021-12-18 DIAGNOSIS — Z8673 Personal history of transient ischemic attack (TIA), and cerebral infarction without residual deficits: Secondary | ICD-10-CM | POA: Insufficient documentation

## 2021-12-18 DIAGNOSIS — W19XXXD Unspecified fall, subsequent encounter: Secondary | ICD-10-CM | POA: Insufficient documentation

## 2021-12-18 DIAGNOSIS — I959 Hypotension, unspecified: Secondary | ICD-10-CM | POA: Insufficient documentation

## 2021-12-18 DIAGNOSIS — G4730 Sleep apnea, unspecified: Secondary | ICD-10-CM | POA: Insufficient documentation

## 2021-12-18 DIAGNOSIS — I952 Hypotension due to drugs: Secondary | ICD-10-CM | POA: Diagnosis not present

## 2021-12-18 DIAGNOSIS — S52615D Nondisplaced fracture of left ulna styloid process, subsequent encounter for closed fracture with routine healing: Secondary | ICD-10-CM | POA: Insufficient documentation

## 2021-12-18 DIAGNOSIS — J4489 Other specified chronic obstructive pulmonary disease: Secondary | ICD-10-CM

## 2021-12-18 DIAGNOSIS — S52592D Other fractures of lower end of left radius, subsequent encounter for closed fracture with routine healing: Secondary | ICD-10-CM | POA: Insufficient documentation

## 2021-12-18 DIAGNOSIS — K219 Gastro-esophageal reflux disease without esophagitis: Secondary | ICD-10-CM | POA: Insufficient documentation

## 2021-12-18 DIAGNOSIS — I2510 Atherosclerotic heart disease of native coronary artery without angina pectoris: Secondary | ICD-10-CM | POA: Insufficient documentation

## 2021-12-18 DIAGNOSIS — R1310 Dysphagia, unspecified: Secondary | ICD-10-CM

## 2021-12-18 DIAGNOSIS — E785 Hyperlipidemia, unspecified: Secondary | ICD-10-CM | POA: Diagnosis not present

## 2021-12-18 DIAGNOSIS — I110 Hypertensive heart disease with heart failure: Secondary | ICD-10-CM | POA: Diagnosis not present

## 2021-12-18 NOTE — Patient Instructions (Signed)
Continue weighing daily and call for an overnight weight gain of 3 pounds or more or a weekly weight gain of more than 5 pounds.   If you have voicemail, please make sure your mailbox is cleaned out so that we may leave a message and please make sure to listen to any voicemails.     

## 2021-12-18 NOTE — Progress Notes (Signed)
[facility] - PHARMACIST COUNSELING NOTE  Guideline-Directed Medical Therapy/Evidence Based Medicine  ACE/ARB/ARNI:  None - issues with hypotension Beta Blocker: Metoprolol succinate 12.5 mg daily Aldosterone Antagonist:  None - limited due to blood pressure Diuretic: Torsemide 10  mg daily SGLT2i:  None.   Wilder Glade copay is $138.02, Vania Rea copay is $144.86 due to being in Coverage Gap (donut hole) can give 30-day free trial. Limited due to BP.   Adherence Assessment  Do you ever forget to take your medication? '[]'$ Yes '[x]'$ No  Do you ever skip doses due to side effects? '[]'$ Yes '[x]'$ No  Do you have trouble affording your medicines? '[]'$ Yes '[x]'$ No  Are you ever unable to pick up your medication due to transportation difficulties? '[]'$ Yes '[x]'$ No  Do you ever stop taking your medications because you don't believe they are helping? '[]'$ Yes '[x]'$ No  Do you check your weight daily? '[]'$ Yes '[x]'$ No   Adherence strategy: Family manages medications  Barriers to obtaining medications: None stated  Vital signs: HR 60, BP 118/59, weight (pounds) 200 ECHO: Date 02/21/2021, EF 55-60, notes The left ventricle has normal function. The left ventricle has no regional wall motion abnormalities. There is mild left ventricular hypertrophy. Left ventricular diastolic function could not be evaluated.      Latest Ref Rng & Units 09/04/2021   11:01 AM 07/20/2021    7:31 AM 07/19/2021    5:45 AM  BMP  Glucose 70 - 99 mg/dL 102  95  102   BUN 8 - 23 mg/dL '14  21  16   '$ Creatinine 0.61 - 1.24 mg/dL 0.97  0.90  0.95   Sodium 135 - 145 mmol/L 145  144  143   Potassium 3.5 - 5.1 mmol/L 4.0  3.6  3.5   Chloride 98 - 111 mmol/L 109  106  105   CO2 22 - 32 mmol/L 32  34  32   Calcium 8.9 - 10.3 mg/dL 8.4  8.9  8.7     Past Medical History:  Diagnosis Date   Anxiety    Arthritis    Asthma    CHF (congestive heart failure) ([facility])    Coronary artery disease    GERD  (gastroesophageal reflux disease)    Glaucoma    Glaucoma    History of stroke    Hyperlipidemia    Hyperlipidemia    Hypertension    Peripheral vascular disease ([place])    Sleep apnea    Stroke [facility])     ASSESSMENT 86 year old male who presents to the HF clinic for a follow up appointment. Patient is losing weight week to week. Hard to tell if patient become fluid overloaded or not so they do not check his weight daily. Patient also is having issues with hypotension, family states the blood pressure has dropped to the 80s. Torsemide was decreased by the PCP and added on pyridostigmine to help. Patient has trouble keeping food/water down but do not want to go down the PEG tube pathway. Patient had 2 recent falls.    PLAN CHF:  Continue metoprolol succinate 12.5 mg daily and torsemide 10 mg daily.  Will possibly need a re-evaluation of EF in the new year.  No additional medications or titation at this time due to concern for hypotension.   Hypotension:  Continue midodrine and pyridostigmine as directed by PCP.   Hx of PE.  Per the notes, PE was in Jan 2023. Recommend continuing apixaban but re-evaluating if needing to  continue with the recent falls.   Time spent: 30 minutes  [name], Pharm.D. Clinical Pharmacist 12/18/2021 9:48 AM    Current Outpatient Medications:    apixaban (ELIQUIS) 5 MG TABS tablet, Take two tablets twice a day for six days then one tablet twice a day afterwards (Patient taking differently: Take 5 mg by mouth 2 (two) times daily.), Disp: 72 tablet, Rfl: 0   calcium carbonate (OSCAL) 1500 (600 Ca) MG TABS tablet, Take 600 mg of elemental calcium by mouth daily with breakfast., Disp: , Rfl:    Cyanocobalamin (VITAMIN B 12) 500 MCG TABS, Take 500 mcg by mouth daily., Disp: 30 tablet, Rfl: 0   Dorzolamide HCl-Timolol Mal PF 22.3-6.8 MG/ML SOLN, Place 1 drop into the right eye 2 (two) times daily., Disp: , Rfl:    ergocalciferol (VITAMIN D2) 1.25 MG  (50000 UT) capsule, Take 1 capsule (50,000 Units total) by mouth once a week. (Patient taking differently: Take 50,000 Units by mouth once a week. Saturday mornings), Disp: 4 capsule, Rfl: 5   ferrous sulfate 324 MG TBEC, Take 324 mg by mouth daily with breakfast., Disp: , Rfl:    fludrocortisone (FLORINEF) 0.1 MG tablet, Take 100 mcg by mouth daily., Disp: , Rfl:    latanoprost (XALATAN) 0.005 % ophthalmic solution, Place 1 drop into both eyes at bedtime., Disp: , Rfl: 0   metoprolol succinate (TOPROL-XL) 25 MG 24 hr tablet, Take 12.5 mg by mouth daily., Disp: , Rfl:    midodrine (PROAMATINE) 10 MG tablet, Take 10 mg by mouth 3 (three) times daily., Disp: , Rfl:    pantoprazole (PROTONIX) 40 MG tablet, Take 1 tablet (40 mg total) by mouth daily., Disp: 30 tablet, Rfl: 0   potassium chloride SA (KLOR-CON M) 20 MEQ tablet, Take 2 tablets (40 mEq total) by mouth daily. (Patient taking differently: Take 60 mEq by mouth daily. Taking 3 tablets), Disp: 30 tablet, Rfl: 0   pyridostigmine (MESTINON) 60 MG tablet, Take 60 mg by mouth 3 (three) times daily., Disp: , Rfl:    rosuvastatin (CRESTOR) 10 MG tablet, Take 10 mg by mouth at bedtime., Disp: , Rfl:    torsemide (DEMADEX) 20 MG tablet, Take 2 tablets po daily in am (Patient taking differently: Take 10 mg by mouth daily.), Disp: 60 tablet, Rfl: 0   COUNSELING POINTS/CLINICAL PEARLS    DRUGS TO CAUTION IN HEART FAILURE  Drug or Class Mechanism  Analgesics NSAIDs COX-2 inhibitors Glucocorticoids  Sodium and water retention, increased systemic vascular resistance, decreased response to diuretics   Diabetes Medications Metformin Thiazolidinediones Rosiglitazone (Avandia) Pioglitazone (Actos) DPP4 Inhibitors Saxagliptin (Onglyza) Sitagliptin (Januvia)   Lactic acidosis Possible calcium channel blockade   Unknown  Antiarrhythmics Class I  Flecainide Disopyramide Class III Sotalol Other Dronedarone  Negative inotrope,  proarrhythmic   Proarrhythmic, beta blockade  Negative inotrope  Antihypertensives Alpha Blockers Doxazosin Calcium Channel Blockers Diltiazem Verapamil Nifedipine Central Alpha Adrenergics Moxonidine Peripheral Vasodilators Minoxidil  Increases renin and aldosterone  Negative inotrope    Possible sympathetic withdrawal  Unknown  Anti-infective Itraconazole Amphotericin B  Negative inotrope Unknown  Hematologic Anagrelide Cilostazol   Possible inhibition of PD IV Inhibition of PD III causing arrhythmias  Neurologic/Psychiatric Stimulants Anti-Seizure Drugs Carbamazepine Pregabalin Antidepressants Tricyclics Citalopram Parkinsons Bromocriptine Pergolide Pramipexole Antipsychotics Clozapine Antimigraine Ergotamine Methysergide Appetite suppressants Bipolar Lithium  Peripheral alpha and beta agonist activity  Negative inotrope and chronotrope Calcium channel blockade  Negative inotrope, proarrhythmic Dose-dependent QT prolongation  Excessive serotonin activity/valvular damage Excessive serotonin  activity/valvular damage Unknown  IgE mediated hypersensitivy, calcium channel blockade  Excessive serotonin activity/valvular damage Excessive serotonin activity/valvular damage Valvular damage  Direct myofibrillar degeneration, adrenergic stimulation  Antimalarials Chloroquine Hydroxychloroquine Intracellular inhibition of lysosomal enzymes  Urologic Agents Alpha Blockers Doxazosin Prazosin Tamsulosin Terazosin  Increased renin and aldosterone  Adapted from Page Carleene Overlie, et al. "Drugs That May Cause or Exacerbate Heart Failure: A Scientific Statement from the American Heart  Association." Circulation 2016; 134:e32-e69. DOI: 10.1161/CIR.0000000000000426   MEDICATION ADHERENCES TIPS AND STRATEGIES Taking medication as prescribed improves patient outcomes in heart failure (reduces hospitalizations, improves symptoms, increases survival) Side  effects of medications can be managed by decreasing doses, switching agents, stopping drugs, or adding additional therapy. Please let someone in the [facility] know if you have having bothersome side effects so we can modify your regimen. Do not alter your medication regimen without talking to [place].  Medication reminders can help patients remember to take drugs on time. If you are missing or forgetting doses you can try linking behaviors, using pill boxes, or an electronic reminder like an alarm on your phone or an app. Some people can also get automated phone calls as medication reminders.

## 2021-12-24 DIAGNOSIS — I6529 Occlusion and stenosis of unspecified carotid artery: Secondary | ICD-10-CM | POA: Diagnosis not present

## 2021-12-24 DIAGNOSIS — Z9981 Dependence on supplemental oxygen: Secondary | ICD-10-CM | POA: Diagnosis not present

## 2021-12-24 DIAGNOSIS — R351 Nocturia: Secondary | ICD-10-CM | POA: Diagnosis not present

## 2021-12-24 DIAGNOSIS — N401 Enlarged prostate with lower urinary tract symptoms: Secondary | ICD-10-CM | POA: Diagnosis not present

## 2021-12-24 DIAGNOSIS — E538 Deficiency of other specified B group vitamins: Secondary | ICD-10-CM | POA: Diagnosis not present

## 2021-12-24 DIAGNOSIS — S52502D Unspecified fracture of the lower end of left radius, subsequent encounter for closed fracture with routine healing: Secondary | ICD-10-CM | POA: Diagnosis not present

## 2021-12-24 DIAGNOSIS — G4730 Sleep apnea, unspecified: Secondary | ICD-10-CM | POA: Diagnosis not present

## 2021-12-24 DIAGNOSIS — D696 Thrombocytopenia, unspecified: Secondary | ICD-10-CM | POA: Diagnosis not present

## 2021-12-24 DIAGNOSIS — H409 Unspecified glaucoma: Secondary | ICD-10-CM | POA: Diagnosis not present

## 2021-12-24 DIAGNOSIS — F0284 Dementia in other diseases classified elsewhere, unspecified severity, with anxiety: Secondary | ICD-10-CM | POA: Diagnosis not present

## 2021-12-24 DIAGNOSIS — J4489 Other specified chronic obstructive pulmonary disease: Secondary | ICD-10-CM | POA: Diagnosis not present

## 2021-12-24 DIAGNOSIS — Z8701 Personal history of pneumonia (recurrent): Secondary | ICD-10-CM | POA: Diagnosis not present

## 2021-12-24 DIAGNOSIS — Z96653 Presence of artificial knee joint, bilateral: Secondary | ICD-10-CM | POA: Diagnosis not present

## 2021-12-24 DIAGNOSIS — I2510 Atherosclerotic heart disease of native coronary artery without angina pectoris: Secondary | ICD-10-CM | POA: Diagnosis not present

## 2021-12-24 DIAGNOSIS — M1990 Unspecified osteoarthritis, unspecified site: Secondary | ICD-10-CM | POA: Diagnosis not present

## 2021-12-24 DIAGNOSIS — Z981 Arthrodesis status: Secondary | ICD-10-CM | POA: Diagnosis not present

## 2021-12-24 DIAGNOSIS — Z7901 Long term (current) use of anticoagulants: Secondary | ICD-10-CM | POA: Diagnosis not present

## 2021-12-24 DIAGNOSIS — Z86711 Personal history of pulmonary embolism: Secondary | ICD-10-CM | POA: Diagnosis not present

## 2021-12-24 DIAGNOSIS — I5032 Chronic diastolic (congestive) heart failure: Secondary | ICD-10-CM | POA: Diagnosis not present

## 2021-12-24 DIAGNOSIS — I110 Hypertensive heart disease with heart failure: Secondary | ICD-10-CM | POA: Diagnosis not present

## 2021-12-24 DIAGNOSIS — K2100 Gastro-esophageal reflux disease with esophagitis, without bleeding: Secondary | ICD-10-CM | POA: Diagnosis not present

## 2021-12-24 DIAGNOSIS — E785 Hyperlipidemia, unspecified: Secondary | ICD-10-CM | POA: Diagnosis not present

## 2021-12-24 DIAGNOSIS — R350 Frequency of micturition: Secondary | ICD-10-CM | POA: Diagnosis not present

## 2021-12-24 DIAGNOSIS — Z8673 Personal history of transient ischemic attack (TIA), and cerebral infarction without residual deficits: Secondary | ICD-10-CM | POA: Diagnosis not present

## 2021-12-31 DIAGNOSIS — Z981 Arthrodesis status: Secondary | ICD-10-CM | POA: Diagnosis not present

## 2021-12-31 DIAGNOSIS — Z86711 Personal history of pulmonary embolism: Secondary | ICD-10-CM | POA: Diagnosis not present

## 2021-12-31 DIAGNOSIS — Z7901 Long term (current) use of anticoagulants: Secondary | ICD-10-CM | POA: Diagnosis not present

## 2021-12-31 DIAGNOSIS — D696 Thrombocytopenia, unspecified: Secondary | ICD-10-CM | POA: Diagnosis not present

## 2021-12-31 DIAGNOSIS — S52502D Unspecified fracture of the lower end of left radius, subsequent encounter for closed fracture with routine healing: Secondary | ICD-10-CM | POA: Diagnosis not present

## 2021-12-31 DIAGNOSIS — Z8701 Personal history of pneumonia (recurrent): Secondary | ICD-10-CM | POA: Diagnosis not present

## 2021-12-31 DIAGNOSIS — M1990 Unspecified osteoarthritis, unspecified site: Secondary | ICD-10-CM | POA: Diagnosis not present

## 2021-12-31 DIAGNOSIS — G4730 Sleep apnea, unspecified: Secondary | ICD-10-CM | POA: Diagnosis not present

## 2021-12-31 DIAGNOSIS — N401 Enlarged prostate with lower urinary tract symptoms: Secondary | ICD-10-CM | POA: Diagnosis not present

## 2021-12-31 DIAGNOSIS — I2510 Atherosclerotic heart disease of native coronary artery without angina pectoris: Secondary | ICD-10-CM | POA: Diagnosis not present

## 2021-12-31 DIAGNOSIS — H409 Unspecified glaucoma: Secondary | ICD-10-CM | POA: Diagnosis not present

## 2021-12-31 DIAGNOSIS — R350 Frequency of micturition: Secondary | ICD-10-CM | POA: Diagnosis not present

## 2021-12-31 DIAGNOSIS — E538 Deficiency of other specified B group vitamins: Secondary | ICD-10-CM | POA: Diagnosis not present

## 2021-12-31 DIAGNOSIS — I5032 Chronic diastolic (congestive) heart failure: Secondary | ICD-10-CM | POA: Diagnosis not present

## 2021-12-31 DIAGNOSIS — Z8673 Personal history of transient ischemic attack (TIA), and cerebral infarction without residual deficits: Secondary | ICD-10-CM | POA: Diagnosis not present

## 2021-12-31 DIAGNOSIS — Z9981 Dependence on supplemental oxygen: Secondary | ICD-10-CM | POA: Diagnosis not present

## 2021-12-31 DIAGNOSIS — I6529 Occlusion and stenosis of unspecified carotid artery: Secondary | ICD-10-CM | POA: Diagnosis not present

## 2021-12-31 DIAGNOSIS — F0284 Dementia in other diseases classified elsewhere, unspecified severity, with anxiety: Secondary | ICD-10-CM | POA: Diagnosis not present

## 2021-12-31 DIAGNOSIS — J4489 Other specified chronic obstructive pulmonary disease: Secondary | ICD-10-CM | POA: Diagnosis not present

## 2021-12-31 DIAGNOSIS — E785 Hyperlipidemia, unspecified: Secondary | ICD-10-CM | POA: Diagnosis not present

## 2021-12-31 DIAGNOSIS — Z96653 Presence of artificial knee joint, bilateral: Secondary | ICD-10-CM | POA: Diagnosis not present

## 2021-12-31 DIAGNOSIS — R351 Nocturia: Secondary | ICD-10-CM | POA: Diagnosis not present

## 2021-12-31 DIAGNOSIS — I110 Hypertensive heart disease with heart failure: Secondary | ICD-10-CM | POA: Diagnosis not present

## 2021-12-31 DIAGNOSIS — K2100 Gastro-esophageal reflux disease with esophagitis, without bleeding: Secondary | ICD-10-CM | POA: Diagnosis not present

## 2022-01-01 DIAGNOSIS — S52502D Unspecified fracture of the lower end of left radius, subsequent encounter for closed fracture with routine healing: Secondary | ICD-10-CM | POA: Diagnosis not present

## 2022-01-03 DIAGNOSIS — I110 Hypertensive heart disease with heart failure: Secondary | ICD-10-CM | POA: Diagnosis not present

## 2022-01-03 DIAGNOSIS — I951 Orthostatic hypotension: Secondary | ICD-10-CM | POA: Diagnosis not present

## 2022-01-03 DIAGNOSIS — I2510 Atherosclerotic heart disease of native coronary artery without angina pectoris: Secondary | ICD-10-CM | POA: Diagnosis not present

## 2022-01-03 DIAGNOSIS — J4489 Other specified chronic obstructive pulmonary disease: Secondary | ICD-10-CM | POA: Diagnosis not present

## 2022-01-03 DIAGNOSIS — I5032 Chronic diastolic (congestive) heart failure: Secondary | ICD-10-CM | POA: Diagnosis not present

## 2022-01-03 DIAGNOSIS — E785 Hyperlipidemia, unspecified: Secondary | ICD-10-CM | POA: Diagnosis not present

## 2022-01-03 DIAGNOSIS — S52502D Unspecified fracture of the lower end of left radius, subsequent encounter for closed fracture with routine healing: Secondary | ICD-10-CM | POA: Diagnosis not present

## 2022-01-03 DIAGNOSIS — M1990 Unspecified osteoarthritis, unspecified site: Secondary | ICD-10-CM | POA: Diagnosis not present

## 2022-01-07 DIAGNOSIS — F0284 Dementia in other diseases classified elsewhere, unspecified severity, with anxiety: Secondary | ICD-10-CM | POA: Diagnosis not present

## 2022-01-07 DIAGNOSIS — Z9981 Dependence on supplemental oxygen: Secondary | ICD-10-CM | POA: Diagnosis not present

## 2022-01-07 DIAGNOSIS — J4489 Other specified chronic obstructive pulmonary disease: Secondary | ICD-10-CM | POA: Diagnosis not present

## 2022-01-07 DIAGNOSIS — N401 Enlarged prostate with lower urinary tract symptoms: Secondary | ICD-10-CM | POA: Diagnosis not present

## 2022-01-07 DIAGNOSIS — I2510 Atherosclerotic heart disease of native coronary artery without angina pectoris: Secondary | ICD-10-CM | POA: Diagnosis not present

## 2022-01-07 DIAGNOSIS — K2100 Gastro-esophageal reflux disease with esophagitis, without bleeding: Secondary | ICD-10-CM | POA: Diagnosis not present

## 2022-01-07 DIAGNOSIS — M1990 Unspecified osteoarthritis, unspecified site: Secondary | ICD-10-CM | POA: Diagnosis not present

## 2022-01-07 DIAGNOSIS — R350 Frequency of micturition: Secondary | ICD-10-CM | POA: Diagnosis not present

## 2022-01-07 DIAGNOSIS — Z7901 Long term (current) use of anticoagulants: Secondary | ICD-10-CM | POA: Diagnosis not present

## 2022-01-07 DIAGNOSIS — E538 Deficiency of other specified B group vitamins: Secondary | ICD-10-CM | POA: Diagnosis not present

## 2022-01-07 DIAGNOSIS — Z8673 Personal history of transient ischemic attack (TIA), and cerebral infarction without residual deficits: Secondary | ICD-10-CM | POA: Diagnosis not present

## 2022-01-07 DIAGNOSIS — R351 Nocturia: Secondary | ICD-10-CM | POA: Diagnosis not present

## 2022-01-07 DIAGNOSIS — I5032 Chronic diastolic (congestive) heart failure: Secondary | ICD-10-CM | POA: Diagnosis not present

## 2022-01-07 DIAGNOSIS — S52502D Unspecified fracture of the lower end of left radius, subsequent encounter for closed fracture with routine healing: Secondary | ICD-10-CM | POA: Diagnosis not present

## 2022-01-07 DIAGNOSIS — Z8701 Personal history of pneumonia (recurrent): Secondary | ICD-10-CM | POA: Diagnosis not present

## 2022-01-07 DIAGNOSIS — E785 Hyperlipidemia, unspecified: Secondary | ICD-10-CM | POA: Diagnosis not present

## 2022-01-07 DIAGNOSIS — Z86711 Personal history of pulmonary embolism: Secondary | ICD-10-CM | POA: Diagnosis not present

## 2022-01-07 DIAGNOSIS — D696 Thrombocytopenia, unspecified: Secondary | ICD-10-CM | POA: Diagnosis not present

## 2022-01-07 DIAGNOSIS — I110 Hypertensive heart disease with heart failure: Secondary | ICD-10-CM | POA: Diagnosis not present

## 2022-01-07 DIAGNOSIS — Z96653 Presence of artificial knee joint, bilateral: Secondary | ICD-10-CM | POA: Diagnosis not present

## 2022-01-07 DIAGNOSIS — H401131 Primary open-angle glaucoma, bilateral, mild stage: Secondary | ICD-10-CM | POA: Diagnosis not present

## 2022-01-07 DIAGNOSIS — H409 Unspecified glaucoma: Secondary | ICD-10-CM | POA: Diagnosis not present

## 2022-01-07 DIAGNOSIS — G4730 Sleep apnea, unspecified: Secondary | ICD-10-CM | POA: Diagnosis not present

## 2022-01-07 DIAGNOSIS — I6529 Occlusion and stenosis of unspecified carotid artery: Secondary | ICD-10-CM | POA: Diagnosis not present

## 2022-01-07 DIAGNOSIS — Z981 Arthrodesis status: Secondary | ICD-10-CM | POA: Diagnosis not present

## 2022-01-12 DIAGNOSIS — J449 Chronic obstructive pulmonary disease, unspecified: Secondary | ICD-10-CM | POA: Diagnosis not present

## 2022-01-15 DIAGNOSIS — S52502D Unspecified fracture of the lower end of left radius, subsequent encounter for closed fracture with routine healing: Secondary | ICD-10-CM | POA: Diagnosis not present

## 2022-01-24 DIAGNOSIS — L570 Actinic keratosis: Secondary | ICD-10-CM | POA: Diagnosis not present

## 2022-01-24 DIAGNOSIS — E46 Unspecified protein-calorie malnutrition: Secondary | ICD-10-CM | POA: Diagnosis not present

## 2022-01-24 DIAGNOSIS — D692 Other nonthrombocytopenic purpura: Secondary | ICD-10-CM | POA: Diagnosis not present

## 2022-01-24 DIAGNOSIS — Z85828 Personal history of other malignant neoplasm of skin: Secondary | ICD-10-CM | POA: Diagnosis not present

## 2022-01-24 DIAGNOSIS — Z6827 Body mass index (BMI) 27.0-27.9, adult: Secondary | ICD-10-CM | POA: Diagnosis not present

## 2022-01-24 DIAGNOSIS — T45515D Adverse effect of anticoagulants, subsequent encounter: Secondary | ICD-10-CM | POA: Diagnosis not present

## 2022-01-24 DIAGNOSIS — X32XXXA Exposure to sunlight, initial encounter: Secondary | ICD-10-CM | POA: Diagnosis not present

## 2022-01-24 DIAGNOSIS — D2262 Melanocytic nevi of left upper limb, including shoulder: Secondary | ICD-10-CM | POA: Diagnosis not present

## 2022-01-24 DIAGNOSIS — D2261 Melanocytic nevi of right upper limb, including shoulder: Secondary | ICD-10-CM | POA: Diagnosis not present

## 2022-01-24 DIAGNOSIS — D2271 Melanocytic nevi of right lower limb, including hip: Secondary | ICD-10-CM | POA: Diagnosis not present

## 2022-01-25 DIAGNOSIS — Z20822 Contact with and (suspected) exposure to covid-19: Secondary | ICD-10-CM | POA: Diagnosis not present

## 2022-02-13 DIAGNOSIS — K224 Dyskinesia of esophagus: Secondary | ICD-10-CM | POA: Diagnosis not present

## 2022-02-13 DIAGNOSIS — Z87898 Personal history of other specified conditions: Secondary | ICD-10-CM | POA: Diagnosis not present

## 2022-02-13 DIAGNOSIS — Z8781 Personal history of (healed) traumatic fracture: Secondary | ICD-10-CM | POA: Diagnosis not present

## 2022-02-13 DIAGNOSIS — Z8673 Personal history of transient ischemic attack (TIA), and cerebral infarction without residual deficits: Secondary | ICD-10-CM | POA: Diagnosis not present

## 2022-02-13 DIAGNOSIS — R296 Repeated falls: Secondary | ICD-10-CM | POA: Diagnosis not present

## 2022-02-14 DIAGNOSIS — I2609 Other pulmonary embolism with acute cor pulmonale: Secondary | ICD-10-CM | POA: Diagnosis not present

## 2022-02-14 DIAGNOSIS — J4489 Other specified chronic obstructive pulmonary disease: Secondary | ICD-10-CM | POA: Diagnosis not present

## 2022-02-14 DIAGNOSIS — R7303 Prediabetes: Secondary | ICD-10-CM | POA: Diagnosis not present

## 2022-02-14 DIAGNOSIS — R739 Hyperglycemia, unspecified: Secondary | ICD-10-CM | POA: Diagnosis not present

## 2022-02-14 DIAGNOSIS — I951 Orthostatic hypotension: Secondary | ICD-10-CM | POA: Diagnosis not present

## 2022-02-14 DIAGNOSIS — D696 Thrombocytopenia, unspecified: Secondary | ICD-10-CM | POA: Diagnosis not present

## 2022-02-14 DIAGNOSIS — I2510 Atherosclerotic heart disease of native coronary artery without angina pectoris: Secondary | ICD-10-CM | POA: Diagnosis not present

## 2022-02-14 DIAGNOSIS — I5032 Chronic diastolic (congestive) heart failure: Secondary | ICD-10-CM | POA: Diagnosis not present

## 2022-02-14 DIAGNOSIS — I10 Essential (primary) hypertension: Secondary | ICD-10-CM | POA: Diagnosis not present

## 2022-02-14 DIAGNOSIS — Z79899 Other long term (current) drug therapy: Secondary | ICD-10-CM | POA: Diagnosis not present

## 2022-02-14 DIAGNOSIS — K224 Dyskinesia of esophagus: Secondary | ICD-10-CM | POA: Diagnosis not present

## 2022-02-14 DIAGNOSIS — E782 Mixed hyperlipidemia: Secondary | ICD-10-CM | POA: Diagnosis not present

## 2022-02-14 DIAGNOSIS — F0390 Unspecified dementia without behavioral disturbance: Secondary | ICD-10-CM | POA: Diagnosis not present

## 2022-02-14 DIAGNOSIS — J42 Unspecified chronic bronchitis: Secondary | ICD-10-CM | POA: Diagnosis not present

## 2022-02-18 ENCOUNTER — Inpatient Hospital Stay
Admission: EM | Admit: 2022-02-18 | Discharge: 2022-02-23 | DRG: 871 | Disposition: A | Discharge status: Hospice | Payer: PPO | Attending: Internal Medicine | Admitting: Internal Medicine

## 2022-02-18 ENCOUNTER — Emergency Department: Payer: PPO

## 2022-02-18 DIAGNOSIS — I951 Orthostatic hypotension: Secondary | ICD-10-CM | POA: Diagnosis present

## 2022-02-18 DIAGNOSIS — I2510 Atherosclerotic heart disease of native coronary artery without angina pectoris: Secondary | ICD-10-CM | POA: Diagnosis not present

## 2022-02-18 DIAGNOSIS — N400 Enlarged prostate without lower urinary tract symptoms: Secondary | ICD-10-CM | POA: Diagnosis present

## 2022-02-18 DIAGNOSIS — Z8673 Personal history of transient ischemic attack (TIA), and cerebral infarction without residual deficits: Secondary | ICD-10-CM

## 2022-02-18 DIAGNOSIS — R1310 Dysphagia, unspecified: Secondary | ICD-10-CM | POA: Diagnosis not present

## 2022-02-18 DIAGNOSIS — H409 Unspecified glaucoma: Secondary | ICD-10-CM | POA: Diagnosis not present

## 2022-02-18 DIAGNOSIS — J9621 Acute and chronic respiratory failure with hypoxia: Secondary | ICD-10-CM | POA: Diagnosis not present

## 2022-02-18 DIAGNOSIS — A419 Sepsis, unspecified organism: Secondary | ICD-10-CM | POA: Diagnosis present

## 2022-02-18 DIAGNOSIS — I700 Atherosclerosis of aorta: Secondary | ICD-10-CM | POA: Diagnosis not present

## 2022-02-18 DIAGNOSIS — Z9189 Other specified personal risk factors, not elsewhere classified: Secondary | ICD-10-CM

## 2022-02-18 DIAGNOSIS — Z515 Encounter for palliative care: Secondary | ICD-10-CM

## 2022-02-18 DIAGNOSIS — U071 COVID-19: Secondary | ICD-10-CM | POA: Diagnosis present

## 2022-02-18 DIAGNOSIS — E1151 Type 2 diabetes mellitus with diabetic peripheral angiopathy without gangrene: Secondary | ICD-10-CM | POA: Diagnosis present

## 2022-02-18 DIAGNOSIS — K219 Gastro-esophageal reflux disease without esophagitis: Secondary | ICD-10-CM | POA: Diagnosis present

## 2022-02-18 DIAGNOSIS — I5032 Chronic diastolic (congestive) heart failure: Secondary | ICD-10-CM | POA: Diagnosis present

## 2022-02-18 DIAGNOSIS — I110 Hypertensive heart disease with heart failure: Secondary | ICD-10-CM | POA: Diagnosis present

## 2022-02-18 DIAGNOSIS — Z9981 Dependence on supplemental oxygen: Secondary | ICD-10-CM | POA: Diagnosis not present

## 2022-02-18 DIAGNOSIS — J1282 Pneumonia due to coronavirus disease 2019: Secondary | ICD-10-CM | POA: Diagnosis present

## 2022-02-18 DIAGNOSIS — J440 Chronic obstructive pulmonary disease with acute lower respiratory infection: Secondary | ICD-10-CM | POA: Diagnosis present

## 2022-02-18 DIAGNOSIS — E872 Acidosis, unspecified: Secondary | ICD-10-CM | POA: Diagnosis not present

## 2022-02-18 DIAGNOSIS — Z8679 Personal history of other diseases of the circulatory system: Secondary | ICD-10-CM

## 2022-02-18 DIAGNOSIS — I2489 Other forms of acute ischemic heart disease: Secondary | ICD-10-CM | POA: Diagnosis present

## 2022-02-18 DIAGNOSIS — Z79899 Other long term (current) drug therapy: Secondary | ICD-10-CM

## 2022-02-18 DIAGNOSIS — K224 Dyskinesia of esophagus: Secondary | ICD-10-CM | POA: Diagnosis not present

## 2022-02-18 DIAGNOSIS — J9601 Acute respiratory failure with hypoxia: Secondary | ICD-10-CM

## 2022-02-18 DIAGNOSIS — E785 Hyperlipidemia, unspecified: Secondary | ICD-10-CM | POA: Diagnosis present

## 2022-02-18 DIAGNOSIS — F0390 Unspecified dementia without behavioral disturbance: Secondary | ICD-10-CM | POA: Diagnosis present

## 2022-02-18 DIAGNOSIS — M1990 Unspecified osteoarthritis, unspecified site: Secondary | ICD-10-CM | POA: Diagnosis not present

## 2022-02-18 DIAGNOSIS — Z8042 Family history of malignant neoplasm of prostate: Secondary | ICD-10-CM

## 2022-02-18 DIAGNOSIS — Z743 Need for continuous supervision: Secondary | ICD-10-CM | POA: Diagnosis not present

## 2022-02-18 DIAGNOSIS — R6520 Severe sepsis without septic shock: Secondary | ICD-10-CM | POA: Diagnosis not present

## 2022-02-18 DIAGNOSIS — R0602 Shortness of breath: Secondary | ICD-10-CM | POA: Diagnosis not present

## 2022-02-18 DIAGNOSIS — F0394 Unspecified dementia, unspecified severity, with anxiety: Secondary | ICD-10-CM | POA: Diagnosis not present

## 2022-02-18 DIAGNOSIS — A4189 Other specified sepsis: Principal | ICD-10-CM | POA: Diagnosis present

## 2022-02-18 DIAGNOSIS — I959 Hypotension, unspecified: Secondary | ICD-10-CM | POA: Diagnosis not present

## 2022-02-18 DIAGNOSIS — Z86711 Personal history of pulmonary embolism: Secondary | ICD-10-CM

## 2022-02-18 DIAGNOSIS — R0902 Hypoxemia: Secondary | ICD-10-CM | POA: Diagnosis not present

## 2022-02-18 DIAGNOSIS — G4733 Obstructive sleep apnea (adult) (pediatric): Secondary | ICD-10-CM | POA: Diagnosis present

## 2022-02-18 DIAGNOSIS — Z66 Do not resuscitate: Secondary | ICD-10-CM | POA: Diagnosis present

## 2022-02-18 DIAGNOSIS — Z96653 Presence of artificial knee joint, bilateral: Secondary | ICD-10-CM | POA: Diagnosis present

## 2022-02-18 DIAGNOSIS — Z7401 Bed confinement status: Secondary | ICD-10-CM | POA: Diagnosis not present

## 2022-02-18 DIAGNOSIS — Z981 Arthrodesis status: Secondary | ICD-10-CM

## 2022-02-18 DIAGNOSIS — I2782 Chronic pulmonary embolism: Secondary | ICD-10-CM | POA: Diagnosis not present

## 2022-02-18 DIAGNOSIS — K222 Esophageal obstruction: Secondary | ICD-10-CM | POA: Diagnosis not present

## 2022-02-18 DIAGNOSIS — J690 Pneumonitis due to inhalation of food and vomit: Secondary | ICD-10-CM | POA: Diagnosis not present

## 2022-02-18 DIAGNOSIS — R069 Unspecified abnormalities of breathing: Secondary | ICD-10-CM | POA: Diagnosis not present

## 2022-02-18 DIAGNOSIS — R7989 Other specified abnormal findings of blood chemistry: Secondary | ICD-10-CM | POA: Diagnosis not present

## 2022-02-18 DIAGNOSIS — I2699 Other pulmonary embolism without acute cor pulmonale: Secondary | ICD-10-CM | POA: Diagnosis present

## 2022-02-18 DIAGNOSIS — Z7901 Long term (current) use of anticoagulants: Secondary | ICD-10-CM

## 2022-02-18 DIAGNOSIS — J90 Pleural effusion, not elsewhere classified: Secondary | ICD-10-CM | POA: Diagnosis not present

## 2022-02-18 DIAGNOSIS — Z7189 Other specified counseling: Secondary | ICD-10-CM | POA: Diagnosis not present

## 2022-02-18 LAB — BRAIN NATRIURETIC PEPTIDE: B Natriuretic Peptide: 193.1 pg/mL — ABNORMAL HIGH (ref 0.0–100.0)

## 2022-02-18 LAB — CBC WITH DIFFERENTIAL/PLATELET
Abs Immature Granulocytes: 0.12 10*3/uL — ABNORMAL HIGH (ref 0.00–0.07)
Basophils Absolute: 0.1 10*3/uL (ref 0.0–0.1)
Basophils Relative: 0 %
Eosinophils Absolute: 0 10*3/uL (ref 0.0–0.5)
Eosinophils Relative: 0 %
HCT: 49.1 % (ref 39.0–52.0)
Hemoglobin: 15.2 g/dL (ref 13.0–17.0)
Immature Granulocytes: 1 %
Lymphocytes Relative: 10 %
Lymphs Abs: 2.1 10*3/uL (ref 0.7–4.0)
MCH: 29.9 pg (ref 26.0–34.0)
MCHC: 31 g/dL (ref 30.0–36.0)
MCV: 96.7 fL (ref 80.0–100.0)
Monocytes Absolute: 1.3 10*3/uL — ABNORMAL HIGH (ref 0.1–1.0)
Monocytes Relative: 7 %
Neutro Abs: 16.9 10*3/uL — ABNORMAL HIGH (ref 1.7–7.7)
Neutrophils Relative %: 82 %
Platelets: 171 10*3/uL (ref 150–400)
RBC: 5.08 MIL/uL (ref 4.22–5.81)
RDW: 15.9 % — ABNORMAL HIGH (ref 11.5–15.5)
WBC: 20.5 10*3/uL — ABNORMAL HIGH (ref 4.0–10.5)
nRBC: 0 % (ref 0.0–0.2)

## 2022-02-18 LAB — BLOOD GAS, ARTERIAL
Acid-Base Excess: 8.8 mmol/L — ABNORMAL HIGH (ref 0.0–2.0)
Bicarbonate: 36.7 mmol/L — ABNORMAL HIGH (ref 20.0–28.0)
O2 Content: 4 L/min
O2 Saturation: 100 %
Patient temperature: 37
pCO2 arterial: 65 mmHg — ABNORMAL HIGH (ref 32–48)
pH, Arterial: 7.36 (ref 7.35–7.45)
pO2, Arterial: 157 mmHg — ABNORMAL HIGH (ref 83–108)

## 2022-02-18 LAB — COMPREHENSIVE METABOLIC PANEL
ALT: 12 U/L (ref 0–44)
AST: 18 U/L (ref 15–41)
Albumin: 3.2 g/dL — ABNORMAL LOW (ref 3.5–5.0)
Alkaline Phosphatase: 85 U/L (ref 38–126)
Anion gap: 10 (ref 5–15)
BUN: 21 mg/dL (ref 8–23)
CO2: 32 mmol/L (ref 22–32)
Calcium: 9.2 mg/dL (ref 8.9–10.3)
Chloride: 102 mmol/L (ref 98–111)
Creatinine, Ser: 1.01 mg/dL (ref 0.61–1.24)
GFR, Estimated: >60 mL/min (ref >60)
Glucose, Bld: 150 mg/dL — ABNORMAL HIGH (ref 70–99)
Potassium: 4 mmol/L (ref 3.5–5.1)
Sodium: 144 mmol/L (ref 135–145)
Total Bilirubin: 2 mg/dL — ABNORMAL HIGH (ref 0.3–1.2)
Total Protein: 6.7 g/dL (ref 6.5–8.1)

## 2022-02-18 LAB — RESP PANEL BY RT-PCR (RSV, FLU A&B, COVID)  RVPGX2
Influenza A by PCR: NEGATIVE
Influenza B by PCR: NEGATIVE
Resp Syncytial Virus by PCR: NEGATIVE
SARS Coronavirus 2 by RT PCR: POSITIVE — AB

## 2022-02-18 LAB — TROPONIN I (HIGH SENSITIVITY)
Troponin I (High Sensitivity): 168 ng/L (ref <18)
Troponin I (High Sensitivity): 338 ng/L (ref <18)

## 2022-02-18 LAB — LACTIC ACID, PLASMA
Lactic Acid, Venous: 1.7 mmol/L (ref 0.5–1.9)
Lactic Acid, Venous: 2 mmol/L (ref 0.5–1.9)

## 2022-02-18 MED ORDER — SODIUM CHLORIDE 0.9 % IV SOLN
100.0000 mg | Freq: Once | INTRAVENOUS | Status: AC
  Administered 2022-02-19: 100 mg via INTRAVENOUS
  Filled 2022-02-18: qty 20

## 2022-02-18 MED ORDER — SODIUM CHLORIDE 0.9 % IV SOLN
100.0000 mg | INTRAVENOUS | Status: DC
  Administered 2022-02-19: 100 mg via INTRAVENOUS
  Filled 2022-02-18 (×2): qty 20

## 2022-02-18 MED ORDER — PIPERACILLIN-TAZOBACTAM 3.375 G IVPB
3.3750 g | Freq: Three times a day (TID) | INTRAVENOUS | Status: DC
  Administered 2022-02-19 (×2): 3.375 g via INTRAVENOUS
  Filled 2022-02-18 (×2): qty 50

## 2022-02-18 MED ORDER — VANCOMYCIN HCL 1750 MG/350ML IV SOLN
1750.0000 mg | Freq: Once | INTRAVENOUS | Status: AC
  Administered 2022-02-19: 1750 mg via INTRAVENOUS
  Filled 2022-02-18 (×2): qty 350

## 2022-02-18 MED ORDER — SODIUM CHLORIDE 0.9 % IV SOLN
500.0000 mg | Freq: Once | INTRAVENOUS | Status: AC
  Administered 2022-02-18: 500 mg via INTRAVENOUS
  Filled 2022-02-18: qty 5

## 2022-02-18 MED ORDER — IPRATROPIUM-ALBUTEROL 0.5-2.5 (3) MG/3ML IN SOLN
3.0000 mL | Freq: Once | RESPIRATORY_TRACT | Status: AC
  Administered 2022-02-18: 3 mL via RESPIRATORY_TRACT
  Filled 2022-02-18: qty 3

## 2022-02-18 MED ORDER — SODIUM CHLORIDE 0.9 % IV BOLUS
1000.0000 mL | Freq: Once | INTRAVENOUS | Status: AC
  Administered 2022-02-18: 1000 mL via INTRAVENOUS

## 2022-02-18 MED ORDER — SODIUM CHLORIDE 0.9 % IV SOLN
1.0000 g | Freq: Once | INTRAVENOUS | Status: AC
  Administered 2022-02-18: 1 g via INTRAVENOUS
  Filled 2022-02-18: qty 10

## 2022-02-18 MED ORDER — VANCOMYCIN HCL 1500 MG/300ML IV SOLN: 1500.0000 mg | INTRAVENOUS | Status: DC

## 2022-02-18 MED ORDER — IOHEXOL 350 MG/ML SOLN
75.0000 mL | Freq: Once | INTRAVENOUS | Status: AC | PRN
  Administered 2022-02-18: 75 mL via INTRAVENOUS

## 2022-02-18 NOTE — ED Notes (Signed)
Trop 168 per lab, [place] notified.

## 2022-02-18 NOTE — ED Notes (Signed)
Pt Lactic critical at 2.0 per lab.  EDP [place] verbally notified.

## 2022-02-18 NOTE — ED Notes (Signed)
RT in room at this time.

## 2022-02-18 NOTE — ED Notes (Signed)
EDP [place] verbally notified of pt increasing trop of 338.

## 2022-02-18 NOTE — Consult Note (Signed)
Pharmacy Antibiotic Note  Chris Kent is a 87 y.o. male admitted on 02/18/2022 with pneumonia.  Pharmacy has been consulted for vancomycin, remdesivir, and zosyn dosing.  Plan: Remdesivir '200mg'$  IV x1 follolwed by '100mg'$  daily for total 3 days Zosyn 3.375g IV (infused over 4 hours) every 8 hours Vancomcycin '1750mg'$  IV x1 followed by vancomycin '1500mg'$  IV every 24 hours Goal AUC 400-550  Est AUC: 518.7 Est Cmax: 37.0 Est Cmin: 12.1 Calculated with SCr 1.01 mg/dL  Continue to monitor and dose adjust antibiotics according to renal function and indication   Height: '5\' 9"'$  (175.3 cm) Weight: 81 kg (178 lb 9.6 oz) IBW/kg (Calculated) : 70.7  Temp (24hrs), Avg:97.6 F (36.4 C), Min:97.6 F (36.4 C), Max:97.6 F (36.4 C)  Recent Labs  Lab 02/18/22 1907 02/18/22 2042 02/18/22 2108  WBC 20.5*  --   --   CREATININE 1.01  --   --   LATICACIDVEN  --  2.0* 1.7    Estimated Creatinine Clearance: 54.4 mL/min (by C-G formula based on SCr of 1.01 mg/dL).    No Known Allergies  Antimicrobials this admission: 1/15 Remdeisivr >> 1/17 1/15 zosyn >>  1/15 vancomycin >>  Microbiology results: 1/15 BCx: sent 1/15 Resp PCR: COVID Pos 1/16 MRSA PCR sent  Thank you for allowing pharmacy to be a part of this patient's care.  Darrick Penna 02/18/2022 10:22 PM

## 2022-02-18 NOTE — ED Notes (Signed)
First nurse-pt brought in via ems from home with sob. Hx copd,  pt on 4 liters oxygen at night and prn.  Pt alert.  Bp 146/70, p110, resp 26, sats 90 on 4 liters per ems.  Pt in wheelchair in lobby.

## 2022-02-18 NOTE — ED Notes (Signed)
Pt repositioned in bed.  Family at bedside.  Pt and family notified of Covid positive result.

## 2022-02-18 NOTE — ED Notes (Signed)
ED Tech Tamara and [place] providing pericare, dry brief, and chux pad at this time d/t purewick leakage.  Pt warm, dry, and in no distress.

## 2022-02-18 NOTE — ED Notes (Signed)
Patient transported to CT 

## 2022-02-18 NOTE — ED Provider Notes (Signed)
[facility] Provider Note   Event Date/Time   First MD Initiated Contact with Patient 02/18/22 1913     (approximate) History  Shortness of Breath  HPI Chris Kent is a 87 y.o. male with a past medical history of CHF, COPD, and dementia who presents via private vehicle with his son who is his primary caretaker complaining of patient being more more short of breath over the last 3 days.  Son states that patient wears 1 L at night sometimes up to 2 and usually does not need anything during the day however over the past few days he has been required more and more supplemental oxygen.  Patient was found to be at 77% on 2 L and was increased to 4 L nasal cannula with good improvement.  Patient denies any recent travel or sick contacts.  Patient only complains of shortness of breath at this time ROS: Patient currently denies any vision changes, tinnitus, difficulty speaking, facial droop, sore throat, chest pain, abdominal pain, nausea/vomiting/diarrhea, dysuria, or weakness/numbness/paresthesias in any extremity   Physical Exam  Triage Vital Signs: ED Triage Vitals  Enc Vitals Group     BP 02/18/22 1847 (!) 95/51     Pulse Rate 02/18/22 1847 (!) 116     Resp 02/18/22 1847 (!) 26     Temp 02/18/22 1847 97.6 F (36.4 C)     Temp Source 02/18/22 1847 Oral     SpO2 02/18/22 1847 (!) 79 %     Weight 02/18/22 1855 178 lb 9.6 oz (81 kg)     Height 02/18/22 1855 '5\' 9"'$  (1.753 m)     Head Circumference --      Peak Flow --      Pain Score --      Pain Loc --      Pain Edu? --      Excl. in [place]? --    Most recent vital signs: Vitals:   02/18/22 2000 02/18/22 2130  BP: 124/64 (!) 110/57  Pulse: 84 100  Resp: (!) 28 (!) 25  Temp:    SpO2: 95% 100%   General: Awake, oriented x4. CV:  Good peripheral perfusion.  Resp:  Normal effort.  Abd:  No distention.  Other:  Elderly the Caucasian male laying in bed in mild respiratory distress ED Results / Procedures /  Treatments  Labs (all labs ordered are listed, but only abnormal results are displayed) Labs Reviewed  RESP PANEL BY RT-PCR (RSV, FLU A&B, COVID)  RVPGX2 - Abnormal; Notable for the following components:      Result Value   SARS Coronavirus 2 by RT PCR POSITIVE (*)    All other components within normal limits  CBC WITH DIFFERENTIAL/PLATELET - Abnormal; Notable for the following components:   WBC 20.5 (*)    RDW 15.9 (*)    Neutro Abs 16.9 (*)    Monocytes Absolute 1.3 (*)    Abs Immature Granulocytes 0.12 (*)    All other components within normal limits  COMPREHENSIVE METABOLIC PANEL - Abnormal; Notable for the following components:   Glucose, Bld 150 (*)    Albumin 3.2 (*)    Total Bilirubin 2.0 (*)    All other components within normal limits  BRAIN NATRIURETIC PEPTIDE - Abnormal; Notable for the following components:   B Natriuretic Peptide 193.1 (*)    All other components within normal limits  BLOOD GAS, ARTERIAL - Abnormal; Notable for the following components:   pCO2 arterial  65 (*)    pO2, Arterial 157 (*)    Bicarbonate 36.7 (*)    Acid-Base Excess 8.8 (*)    All other components within normal limits  LACTIC ACID, PLASMA - Abnormal; Notable for the following components:   Lactic Acid, Venous 2.0 (*)    All other components within normal limits  TROPONIN I (HIGH SENSITIVITY) - Abnormal; Notable for the following components:   Troponin I (High Sensitivity) 168 (*)    All other components within normal limits  TROPONIN I (HIGH SENSITIVITY) - Abnormal; Notable for the following components:   Troponin I (High Sensitivity) 338 (*)    All other components within normal limits  CULTURE, BLOOD (ROUTINE X 2)  CULTURE, BLOOD (ROUTINE X 2)  LACTIC ACID, PLASMA  URINALYSIS, ROUTINE W REFLEX MICROSCOPIC   EKG ED ECG REPORT I, Naaman Plummer, the attending physician, personally viewed and interpreted this ECG. Date: 02/18/2022 EKG Time: 1854 Rate: 101 Rhythm: Tachycardic  sinus rhythm QRS Axis: normal Intervals: Right bundle blanch block ST/T Wave abnormalities: normal Narrative Interpretation: Sinus tachycardia with right bundle branch block.  No evidence of acute ischemia RADIOLOGY ED MD interpretation: Single view portable chest x-ray interpreted independently by me shows coarsened interstitial markings in both lungs with patchy opacities in the lung bases -Agree with radiology assessment Official radiology report(s): CT Angio Chest PE W/Cm &/Or Wo Cm  Result Date: 02/18/2022 CLINICAL DATA:  Shortness of breath EXAM: CT ANGIOGRAPHY CHEST WITH CONTRAST TECHNIQUE: Multidetector CT imaging of the chest was performed using the standard protocol during bolus administration of intravenous contrast. Multiplanar CT image reconstructions and MIPs were obtained to evaluate the vascular anatomy. RADIATION DOSE REDUCTION: This exam was performed according to the departmental dose-optimization program which includes automated exposure control, adjustment of the mA and/or kV according to patient size and/or use of iterative reconstruction technique. CONTRAST:  70m OMNIPAQUE IOHEXOL 350 MG/ML SOLN COMPARISON:  11/19/2021 FINDINGS: Cardiovascular: Contrast injection is sufficient to demonstrate satisfactory opacification of the pulmonary arteries to the segmental level. There is no acute pulmonary embolus or evidence of right heart strain. Chronic filling defect of the left lower lobe artery is unchanged. The size of the main pulmonary artery is normal. Small pericardial effusion. There is calcific aortic atherosclerosis. There are coronary artery calcifications. Mediastinum/Nodes: Patulous and debris containing esophagus. No lymphadenopathy. Lungs/Pleura: Unchanged 4 mm right middle lobe nodule (5:73). Multifocal bilateral airspace opacities, greatest in the right lower lobe. Bibasilar atelectasis. Upper Abdomen: Contrast bolus timing is not optimized for evaluation of the abdominal  organs. The visualized portions of the organs of the upper abdomen are normal. Musculoskeletal: Ankylosis and multilevel posterior instrumented fusion. No acute abnormality. Review of the MIP images confirms the above findings. IMPRESSION: 1. No acute pulmonary embolus. 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 2. Multifocal bilateral airspace opacities, greatest in the right lower lobe, concerning for multifocal pneumonia. 3. Patulous and debris containing esophagus. 4. Small pericardial effusion. 5. Unchanged 4 mm right middle lobe nodule.  No follow-up required. Aortic Atherosclerosis (ICD10-I70.0). Electronically Signed   By: KUlyses JarredM.D.   On: 02/18/2022 21:32   DG Chest Portable 1 View  Result Date: 02/18/2022 CLINICAL DATA:  Shortness of breath EXAM: PORTABLE CHEST 1 VIEW COMPARISON:  Chest radiograph 07/20/2021, CT chest 11/19/2021 FINDINGS: The cardiomediastinal silhouette is within normal limits There are diffusely increased interstitial markings with patchy opacities in the lung bases, right more than left. There is a possible trace right pleural effusion. There  is no pneumothorax There is no acute osseous abnormality. Spinal fusion hardware is again noted. IMPRESSION: Coarsened interstitial markings in both lungs with patchy opacities in the lung bases, right worse than left, suspicious for multifocal infection in the correct clinical setting. Suspect trace right pleural effusion. Electronically Signed   By: Valetta Mole M.D.   On: 02/18/2022 19:57   PROCEDURES: Critical Care performed: Yes, see critical care procedure note(s) .1-3 Lead EKG Interpretation  Performed by: Naaman Plummer, MD Authorized by: Naaman Plummer, MD     Interpretation: normal     ECG rate:  71   ECG rate assessment: normal     Rhythm: sinus rhythm     Ectopy: none     Conduction: normal   CRITICAL CARE Performed by: Naaman Plummer  Total critical care time: 35 minutes  Critical care time was  exclusive of separately billable procedures and treating other patients.  Critical care was necessary to treat or prevent imminent or life-threatening deterioration.  Critical care was time spent personally by me on the following activities: development of treatment plan with patient and/or surrogate as well as nursing, discussions with consultants, evaluation of patient's response to treatment, examination of patient, obtaining history from patient or surrogate, ordering and performing treatments and interventions, ordering and review of laboratory studies, ordering and review of radiographic studies, pulse oximetry and re-evaluation of patient's condition.  MEDICATIONS ORDERED IN ED: Medications  cefTRIAXone (ROCEPHIN) 1 g in sodium chloride 0.9 % 100 mL IVPB (1 g Intravenous New Bag/Given 02/18/22 2139)  azithromycin (ZITHROMAX) 500 mg in sodium chloride 0.9 % 250 mL IVPB (has no administration in time range)  iohexol (OMNIPAQUE) 350 MG/ML injection 75 mL (75 mLs Intravenous Contrast Given 02/18/22 2113)  ipratropium-albuterol (DUONEB) 0.5-2.5 (3) MG/3ML nebulizer solution 3 mL (3 mLs Nebulization Given 02/18/22 2135)  sodium chloride 0.9 % bolus 1,000 mL (1,000 mLs Intravenous New Bag/Given 02/18/22 2139)   IMPRESSION / MDM / ASSESSMENT AND PLAN / ED COURSE  I reviewed the triage vital signs and the nursing notes.                             The patient is on the cardiac monitor to evaluate for evidence of arrhythmia and/or significant heart rate changes. Patient's presentation is most consistent with acute presentation with potential threat to life or bodily function. Presentation most consistent with Viral Syndrome.  Patient has tested positive for COVID-19. At this time patient is requiring submental oxygenation due to acute hypoxic respiratory failure.  Given History and Exam I have a lower suspicion for: Emergent CardioPulmonary causes [such as Acute Asthma or COPD Exacerbation, acute  Heart Failure or exacerbation, PE, PTX, atypical ACS, PNA]. Emergent Otolaryngeal causes [such as PTA, RPA, [place], Epiglottitis, EBV].  Regarding Emergent Travel or Immunosuppressive related infectious: I have a low suspicion for acute HIV.  Given radiologic evidence for patchy bilateral airspace opacities concerning for viral pneumonia, continued need for supplemental oxygenation due to acute hypoxic respiratory failure, and need for further evaluation and management, patient will require admission  Dispo: Admit to medicine   FINAL CLINICAL IMPRESSION(S) / ED DIAGNOSES   Final diagnoses:  Acute on chronic hypoxic respiratory failure ([place])  COVID-19 virus infection  Sepsis with acute hypoxic respiratory failure without septic shock, due to unspecified organism [facility])   Rx / DC Orders   ED Discharge Orders     None  Note:  This document was prepared using Dragon voice recognition software and may include unintentional dictation errors.   Naaman Plummer, MD 02/18/22 [phone]

## 2022-02-18 NOTE — ED Notes (Signed)
Blue top sent to lab with save label at this time

## 2022-02-18 NOTE — H&P (Signed)
History and Physical   [facility] [place] [facility] Admission History and Physical [employer], D.O.    Patient Name: Chris Kent MR#: [id number] Date of Birth: 1935/08/01 Date of Admission: 02/18/2022  Referring MD/NP/PA: Dr. Cheri Fowler Primary Care Physician: Idelle Crouch, MD  Chief Complaint:  Chief Complaint  Patient presents with   Shortness of Breath  Please note the entire history is obtained from the patient's emergency department chart, emergency department provider and the patient's family who is at the bedside. Patient's personal history is limited by dementia.   HPI: Chris Kent is a 87 y.o. male with a known history of dementia, anxiety, arthritis, asthma, CHF newly on 2 L of O2 at night and as needed, CAD, GERD, CVA, hypertension, hyperlipidemia, peripheral vascular disease and OSA presents to the emergency department for evaluation of shortness of breath.  Patient was in a usual state of health until this morning when he woke up with complaints of shortness of breath.  Over the past few days patient has been requiring more oxygen.  Of note patient has esophageal dysmotility which causes occasional aspiration.  Patient denies fevers/chills, weakness, dizziness, chest pain, shortness of breath, N/V/C/D, abdominal pain, dysuria/frequency, changes in mental status.    Otherwise there has been no change in status. Patient has been taking medication as prescribed and there has been no recent change in medication or diet.  No recent antibiotics.  There has been no recent illness, hospitalizations, travel or sick contacts.    EMS/ED Course: Patient received Rocephin, azithromycin, normal saline, DuoNeb. Medical admission has been requested for further management of sepsis and  acute hypoxic respiratory failure secondary to COVID-19, multifocal pneumonia elevated troponin  Review of Systems:  CONSTITUTIONAL: Positive fatigue.  No fever/chills, weakness,  weight gain/loss, headache. EYES: No blurry or double vision. ENT: No tinnitus, postnasal drip, redness or soreness of the oropharynx. RESPIRATORY: Positive cough, dyspnea, wheeze.  No hemoptysis.  CARDIOVASCULAR: No chest pain, palpitations, syncope, orthopnea. No lower extremity edema.  GASTROINTESTINAL: No nausea, vomiting, abdominal pain, diarrhea, constipation.  No hematemesis, melena or hematochezia. GENITOURINARY: No dysuria, frequency, hematuria. ENDOCRINE: No polyuria or nocturia. No heat or cold intolerance. HEMATOLOGY: No anemia, bruising, bleeding. INTEGUMENTARY: No rashes, ulcers, lesions. MUSCULOSKELETAL: No arthritis, gout. NEUROLOGIC: No numbness, tingling, ataxia, seizure-type activity, weakness. PSYCHIATRIC: No anxiety, depression, insomnia.   Past Medical History:  Diagnosis Date   Anxiety    Arthritis    Asthma    CHF (congestive heart failure) ([place])    Coronary artery disease    GERD (gastroesophageal reflux disease)    Glaucoma    Glaucoma    History of stroke    Hyperlipidemia    Hyperlipidemia    Hypertension    Peripheral vascular disease ([place])    Sleep apnea    Stroke [facility])     Past Surgical History:  Procedure Laterality Date   ESOPHAGOGASTRODUODENOSCOPY (EGD) WITH PROPOFOL N/A 07/19/2021   Procedure: ESOPHAGOGASTRODUODENOSCOPY (EGD) WITH PROPOFOL;  Surgeon: Annamaria Helling, DO;  Location: [facility] ENDOSCOPY;  Service: Gastroenterology;  Laterality: N/A;   REPLACEMENT TOTAL KNEE BILATERAL       reports that he has never smoked. He has never used smokeless tobacco. He reports that he does not drink alcohol and does not use drugs.  No Known Allergies  Family History  Problem Relation Age of Onset   Prostate cancer Father     Prior to Admission medications   Medication Sig Start Date End Date Taking? Authorizing  Provider  apixaban (ELIQUIS) 5 MG TABS tablet Take two tablets twice a day for six days then one tablet twice a day  afterwards Patient taking differently: Take 5 mg by mouth 2 (two) times daily. 02/26/21  Yes Wieting, Richard, MD  calcium carbonate (OSCAL) 1500 (600 Ca) MG TABS tablet Take 600 mg of elemental calcium by mouth daily with breakfast.   Yes [provider]  Cyanocobalamin (VITAMIN B 12) 500 MCG TABS Take 500 mcg by mouth daily. 02/26/21  Yes Wieting, Richard, MD  Dorzolamide HCl-Timolol Mal PF 22.3-6.8 MG/ML SOLN Place 1 drop into the right eye 2 (two) times daily.   Yes [provider]  ergocalciferol (VITAMIN D2) 1.25 MG (50000 UT) capsule Take 1 capsule (50,000 Units total) by mouth once a week. Patient taking differently: Take 50,000 Units by mouth once a week. Saturday mornings 07/05/21  Yes Hackney, Otila Kluver A, FNP  ferrous sulfate 324 MG TBEC Take 324 mg by mouth daily with breakfast.   Yes [provider]  fludrocortisone (FLORINEF) 0.1 MG tablet Take 100 mcg by mouth daily. 06/27/21  Yes [provider]  latanoprost (XALATAN) 0.005 % ophthalmic solution Place 1 drop into both eyes at bedtime.   Yes [provider]  metoCLOPramide (REGLAN) 10 MG tablet Take 10 mg by mouth every 6 (six) hours as needed. 02/14/22 02/24/22 Yes [provider]  metoprolol succinate (TOPROL-XL) 25 MG 24 hr tablet Take 12.5 mg by mouth daily. 06/03/21  Yes [provider]  midodrine (PROAMATINE) 10 MG tablet Take 10 mg by mouth 3 (three) times daily. 06/13/21  Yes [provider]  potassium chloride SA (KLOR-CON M) 20 MEQ tablet Take 2 tablets (40 mEq total) by mouth daily. Patient taking differently: Take 60 mEq by mouth daily. Taking 3 tablets 07/05/21  Yes Hackney, Tina A, FNP  pyridostigmine (MESTINON) 60 MG tablet Take 60 mg by mouth 3 (three) times daily.   Yes [provider]  rosuvastatin (CRESTOR) 10 MG tablet Take 10 mg by mouth at bedtime.   Yes [provider]  tamsulosin (FLOMAX) 0.4 MG CAPS capsule Take 0.4 mg by mouth daily.  12/22/21  Yes [provider]  torsemide (DEMADEX) 20 MG tablet Take 2 tablets po daily in am Patient taking differently: Take 10 mg by mouth daily. 02/27/21  Yes Wieting, Richard, MD  pantoprazole (PROTONIX) 40 MG tablet Take 1 tablet (40 mg total) by mouth daily. Patient not taking: Reported on 02/18/2022 07/20/21   Sharen Hones, MD    Physical Exam: Vitals:   02/18/22 1856 02/18/22 1906 02/18/22 1945 02/18/22 2000  BP:   137/73 124/64  Pulse: (!) 101  (!) 101 84  Resp: (!) 30  (!) 27 (!) 28  Temp:      TempSrc:      SpO2: (!) 88%  94% 95%  Weight:      Height:  '5\' 9"'$  (1.753 m)      GENERAL: 87 y.o.-year-old male patient, well-developed, well-nourished lying in the bed in no acute distress.  Pleasant and cooperative.   HEENT: Head atraumatic, normocephalic. Pupils equal. Mucus membranes moist. NECK: Supple. No JVD. CHEST: Normal breath sounds bilaterally. No wheezing, rales, rhonchi or crackles. No use of accessory muscles of respiration.  No reproducible chest wall tenderness.  CARDIOVASCULAR: S1, S2 normal. No murmurs, rubs, or gallops. Cap refill <2 seconds. Pulses intact distally.  ABDOMEN: Soft, nondistended, nontender. No rebound, guarding, rigidity. Normoactive bowel sounds present in all four quadrants.  EXTREMITIES: No pedal edema, cyanosis, or clubbing. No calf tenderness or Homan's sign.  NEUROLOGIC: The patient is alert and oriented x 3. Cranial nerves II through XII are grossly intact with no focal sensorimotor deficit. PSYCHIATRIC:  Normal affect, mood, thought content. SKIN: Warm, dry, and intact without obvious rash, lesion, or ulcer.    Labs on Admission:  CBC: Recent Labs  Lab 02/18/22 1907  WBC 20.5*  NEUTROABS 16.9*  HGB 15.2  HCT 49.1  MCV 96.7  PLT 419   Basic Metabolic Panel: Recent Labs  Lab 02/18/22 1907  NA 144  K 4.0  CL 102  CO2 32  GLUCOSE 150*  BUN 21  CREATININE 1.01  CALCIUM 9.2   GFR: Estimated Creatinine Clearance:  54.4 mL/min (by C-G formula based on SCr of 1.01 mg/dL). Liver Function Tests: Recent Labs  Lab 02/18/22 1907  AST 18  ALT 12  ALKPHOS 85  BILITOT 2.0*  PROT 6.7  ALBUMIN 3.2*   No results for input(s): "LIPASE", "AMYLASE" in the last 168 hours. No results for input(s): "AMMONIA" in the last 168 hours. Coagulation Profile: No results for input(s): "INR", "PROTIME" in the last 168 hours. Cardiac Enzymes: No results for input(s): "CKTOTAL", "CKMB", "CKMBINDEX", "TROPONINI" in the last 168 hours. BNP (last 3 results) No results for input(s): "PROBNP" in the last 8760 hours. HbA1C: No results for input(s): "HGBA1C" in the last 72 hours. CBG: No results for input(s): "GLUCAP" in the last 168 hours. Lipid Profile: No results for input(s): "CHOL", "HDL", "LDLCALC", "TRIG", "CHOLHDL", "LDLDIRECT" in the last 72 hours. Thyroid Function Tests: No results for input(s): "TSH", "T4TOTAL", "FREET4", "T3FREE", "THYROIDAB" in the last 72 hours. Anemia Panel: No results for input(s): "VITAMINB12", "FOLATE", "FERRITIN", "TIBC", "IRON", "RETICCTPCT" in the last 72 hours. Urine analysis:    Component Value Date/Time   COLORURINE YELLOW (A) 07/16/2021 2341   APPEARANCEUR CLEAR (A) 07/16/2021 2341   APPEARANCEUR Clear 11/21/2017 1011   LABSPEC 1.043 (H) 07/16/2021 2341   LABSPEC 1.013 09/20/2012 0122   PHURINE 7.0 07/16/2021 2341   GLUCOSEU NEGATIVE 07/16/2021 2341   GLUCOSEU Negative 09/20/2012 0122   HGBUR NEGATIVE 07/16/2021 2341   BILIRUBINUR NEGATIVE 07/16/2021 2341   BILIRUBINUR Negative 11/21/2017 1011   BILIRUBINUR Negative 09/20/2012 0122   KETONESUR NEGATIVE 07/16/2021 2341   PROTEINUR NEGATIVE 07/16/2021 2341   NITRITE NEGATIVE 07/16/2021 2341   LEUKOCYTESUR NEGATIVE 07/16/2021 2341   LEUKOCYTESUR Negative 09/20/2012 0122   Sepsis Labs: '@LABRCNTIP'$ (procalcitonin:4,lacticidven:4) ) Recent Results (from the past 240 hour(s))  Resp panel by RT-PCR (RSV, Flu A&B, Covid)  Anterior Nasal Swab     Status: Abnormal   Collection Time: 02/18/22  7:08 PM   Specimen: Anterior Nasal Swab  Result Value Ref Range Status   SARS Coronavirus 2 by RT PCR POSITIVE (A) NEGATIVE Final    Comment: (NOTE) SARS-CoV-2 target nucleic acids are DETECTED.  The SARS-CoV-2 RNA is generally detectable in upper respiratory specimens during the acute phase of infection. Positive results are indicative of the presence of the identified virus, but do not rule out bacterial infection or co-infection with other pathogens not detected by the test. Clinical correlation with patient history and other diagnostic information is necessary to determine patient infection status. The expected result is Negative.  Fact Sheet for Patients: EntrepreneurPulse.com.au  Fact Sheet for Healthcare Providers: IncredibleEmployment.be  This test is not yet approved or cleared by the [place] FDA and  has been authorized for detection and/or diagnosis of SARS-CoV-2 by FDA under an  Emergency Use Authorization (EUA).  This EUA will remain in effect (meaning this test can be used) for the duration of  the COVID-19 declaration under Section 564(b)(1) of the A ct, 21 U.S.C. section 360bbb-3(b)(1), unless the authorization is terminated or revoked sooner.     Influenza A by PCR NEGATIVE NEGATIVE Final   Influenza B by PCR NEGATIVE NEGATIVE Final    Comment: (NOTE) The Xpert Xpress SARS-CoV-2/FLU/RSV plus assay is intended as an aid in the diagnosis of influenza from Nasopharyngeal swab specimens and should not be used as a sole basis for treatment. Nasal washings and aspirates are unacceptable for Xpert Xpress SARS-CoV-2/FLU/RSV testing.  Fact Sheet for Patients: EntrepreneurPulse.com.au  Fact Sheet for Healthcare Providers: IncredibleEmployment.be  This test is not yet approved or cleared by the [place] FDA and has  been authorized for detection and/or diagnosis of SARS-CoV-2 by FDA under an Emergency Use Authorization (EUA). This EUA will remain in effect (meaning this test can be used) for the duration of the COVID-19 declaration under Section 564(b)(1) of the Act, 21 U.S.C. section 360bbb-3(b)(1), unless the authorization is terminated or revoked.     Resp Syncytial Virus by PCR NEGATIVE NEGATIVE Final    Comment: (NOTE) Fact Sheet for Patients: EntrepreneurPulse.com.au  Fact Sheet for Healthcare Providers: IncredibleEmployment.be  This test is not yet approved or cleared by the [place] FDA and has been authorized for detection and/or diagnosis of SARS-CoV-2 by FDA under an Emergency Use Authorization (EUA). This EUA will remain in effect (meaning this test can be used) for the duration of the COVID-19 declaration under Section 564(b)(1) of the Act, 21 U.S.C. section 360bbb-3(b)(1), unless the authorization is terminated or revoked.  Performed at [facility], [address] [place], [place] [address]      Radiological Exams on Admission: DG Chest Portable 1 View  Result Date: 02/18/2022 CLINICAL DATA:  Shortness of breath EXAM: PORTABLE CHEST 1 VIEW COMPARISON:  Chest radiograph 07/20/2021, CT chest 11/19/2021 FINDINGS: The cardiomediastinal silhouette is within normal limits There are diffusely increased interstitial markings with patchy opacities in the lung bases, right more than left. There is a possible trace right pleural effusion. There is no pneumothorax There is no acute osseous abnormality. Spinal fusion hardware is again noted. IMPRESSION: Coarsened interstitial markings in both lungs with patchy opacities in the lung bases, right worse than left, suspicious for multifocal infection in the correct clinical setting. Suspect trace right pleural effusion. Electronically Signed   By: Valetta Mole M.D.   On: 02/18/2022 19:57     EKG: Normal sinus rhythm at 71 bpm with normal axis and nonspecific ST-T wave changes.   Assessment/Plan  This is a 87 y.o. male with a history of dementia, anxiety, arthritis, asthma, CHF newly on 2 L of O2 at night and as needed, CAD, GERD, CVA, hypertension, hyperlipidemia, peripheral vascular disease and OSA now being admitted with:  #. Acute hypoxic resp failure Sepsis secondary to  COVID 19 / multifocal pneumonia, concern for aspiration - Admit to inpatient with telemetry monitoring - IV antibiotics: Zosyn/Vanco given concern for aspiration.  - Contact/droplet precautions - Follow up blood,urine & sputum cultures - Trend lactate - Repeat CBC in am.  - Remdesivir per pharmacy - NPO, swallow eval   #. Elevated troponin likely 2/2 demand. No chest pain - Trend troponins, check lipids. - Consider cardiology  #. History of CHF - Monitor Is/Os - Continue metoprolol, torsemide  #. History of HTN, orthostatic - Continue metoprolol, florinef  #.  History of GERD - Continue pantoprazole  #. History of HLD - Continue rosuvastatin  #. History of CVA Eliquis - Continue ***  Admission status: Inpatient, telemetry IV Fluids: HL Diet/Nutrition: N.p.o. Consults called: Speech and swallow DVT Px: Eliquis, SCDs and early ambulation. Code Status: DNR Disposition Plan: To home in 1-2 days  All the records are reviewed and case discussed with ED provider. Management plans discussed with the patient and/or family who express understanding and agree with plan of care.  Kristee Angus D.O. on 02/18/2022 at 9:29 PM CC: Primary care physician; Idelle Crouch, MD   02/18/2022, 9:29 PM

## 2022-02-18 NOTE — ED Triage Notes (Signed)
To triage from home. Pt woke up this morning c/o generalized malaise and shortness of breath. Pt has esophageal motility issues and frequently aspirates medications according to grandson.  Pt normally alert, oriented and ambulatory but grandson states weakness has gotten worse today.  Normally just 02 at 2L only at night.  Pt on 4L [place] in triage maintaining sats in mid 90s with increased work of breathing noted.  Pt denies any pain.

## 2022-02-19 ENCOUNTER — Other Ambulatory Visit: Payer: Self-pay

## 2022-02-19 ENCOUNTER — Encounter: Payer: Self-pay | Admitting: Family Medicine

## 2022-02-19 ENCOUNTER — Inpatient Hospital Stay (HOSPITAL_COMMUNITY)
Admit: 2022-02-19 | Discharge: 2022-02-19 | Disposition: A | Discharge status: Home / Self Care | Payer: PPO | Attending: Internal Medicine | Admitting: Internal Medicine

## 2022-02-19 DIAGNOSIS — I2782 Chronic pulmonary embolism: Secondary | ICD-10-CM

## 2022-02-19 DIAGNOSIS — Z8679 Personal history of other diseases of the circulatory system: Secondary | ICD-10-CM

## 2022-02-19 DIAGNOSIS — Z9189 Other specified personal risk factors, not elsewhere classified: Secondary | ICD-10-CM | POA: Diagnosis not present

## 2022-02-19 DIAGNOSIS — U071 COVID-19: Secondary | ICD-10-CM | POA: Diagnosis not present

## 2022-02-19 DIAGNOSIS — N400 Enlarged prostate without lower urinary tract symptoms: Secondary | ICD-10-CM

## 2022-02-19 DIAGNOSIS — J9601 Acute respiratory failure with hypoxia: Secondary | ICD-10-CM

## 2022-02-19 DIAGNOSIS — I5032 Chronic diastolic (congestive) heart failure: Secondary | ICD-10-CM

## 2022-02-19 DIAGNOSIS — A419 Sepsis, unspecified organism: Secondary | ICD-10-CM | POA: Diagnosis not present

## 2022-02-19 DIAGNOSIS — R7989 Other specified abnormal findings of blood chemistry: Secondary | ICD-10-CM

## 2022-02-19 DIAGNOSIS — J9621 Acute and chronic respiratory failure with hypoxia: Secondary | ICD-10-CM

## 2022-02-19 DIAGNOSIS — E785 Hyperlipidemia, unspecified: Secondary | ICD-10-CM

## 2022-02-19 LAB — ECHOCARDIOGRAM LIMITED
AV Mean grad: 2 mmHg
AV Peak grad: 3.3 mmHg
Ao pk vel: 0.91 m/s
Area-P 1/2: 3.54 cm2
Calc EF: 55.7 %
Height: 69 in
Single Plane A2C EF: 55.7 %
Single Plane A4C EF: 57 %
Weight: 2878.33 oz

## 2022-02-19 LAB — URINALYSIS, ROUTINE W REFLEX MICROSCOPIC
Bacteria, UA: NONE SEEN
Bilirubin Urine: NEGATIVE
Bilirubin Urine: NEGATIVE
Glucose, UA: NEGATIVE mg/dL
Glucose, UA: NEGATIVE mg/dL
Hgb urine dipstick: NEGATIVE
Hgb urine dipstick: NEGATIVE
Ketones, ur: NEGATIVE mg/dL
Ketones, ur: NEGATIVE mg/dL
Leukocytes,Ua: NEGATIVE
Leukocytes,Ua: NEGATIVE
Nitrite: NEGATIVE
Nitrite: NEGATIVE
Protein, ur: NEGATIVE mg/dL
Protein, ur: NEGATIVE mg/dL
Specific Gravity, Urine: >1.046 — ABNORMAL HIGH (ref 1.005–1.030)
Specific Gravity, Urine: >1.046 — ABNORMAL HIGH (ref 1.005–1.030)
pH: 6 (ref 5.0–8.0)
pH: 6 (ref 5.0–8.0)

## 2022-02-19 LAB — LACTIC ACID, PLASMA
Lactic Acid, Venous: 2.1 mmol/L (ref 0.5–1.9)
Lactic Acid, Venous: 1.7 mmol/L (ref 0.5–1.9)

## 2022-02-19 LAB — CBC
HCT: 42.1 % (ref 39.0–52.0)
Hemoglobin: 13.2 g/dL (ref 13.0–17.0)
MCH: 30.6 pg (ref 26.0–34.0)
MCHC: 31.4 g/dL (ref 30.0–36.0)
MCV: 97.7 fL (ref 80.0–100.0)
Platelets: 143 10*3/uL — ABNORMAL LOW (ref 150–400)
RBC: 4.31 MIL/uL (ref 4.22–5.81)
RDW: 15.9 % — ABNORMAL HIGH (ref 11.5–15.5)
WBC: 23 10*3/uL — ABNORMAL HIGH (ref 4.0–10.5)
nRBC: 0 % (ref 0.0–0.2)

## 2022-02-19 LAB — LIPID PANEL
Cholesterol: 71 mg/dL (ref 0–200)
HDL: 32 mg/dL — ABNORMAL LOW (ref >40)
LDL Cholesterol: 29 mg/dL (ref 0–99)
Total CHOL/HDL Ratio: 2.2 RATIO
Triglycerides: 52 mg/dL (ref <150)
VLDL: 10 mg/dL (ref 0–40)

## 2022-02-19 LAB — COMPREHENSIVE METABOLIC PANEL
ALT: 11 U/L (ref 0–44)
AST: 15 U/L (ref 15–41)
Albumin: 2.6 g/dL — ABNORMAL LOW (ref 3.5–5.0)
Alkaline Phosphatase: 65 U/L (ref 38–126)
Anion gap: 8 (ref 5–15)
BUN: 20 mg/dL (ref 8–23)
CO2: 30 mmol/L (ref 22–32)
Calcium: 8.4 mg/dL — ABNORMAL LOW (ref 8.9–10.3)
Chloride: 105 mmol/L (ref 98–111)
Creatinine, Ser: 0.99 mg/dL (ref 0.61–1.24)
GFR, Estimated: >60 mL/min (ref >60)
Glucose, Bld: 121 mg/dL — ABNORMAL HIGH (ref 70–99)
Potassium: 4.2 mmol/L (ref 3.5–5.1)
Sodium: 143 mmol/L (ref 135–145)
Total Bilirubin: 1.6 mg/dL — ABNORMAL HIGH (ref 0.3–1.2)
Total Protein: 5.4 g/dL — ABNORMAL LOW (ref 6.5–8.1)

## 2022-02-19 LAB — TROPONIN I (HIGH SENSITIVITY)
Troponin I (High Sensitivity): 398 ng/L (ref <18)
Troponin I (High Sensitivity): 437 ng/L (ref <18)
Troponin I (High Sensitivity): 353 ng/L (ref <18)
Troponin I (High Sensitivity): 299 ng/L (ref <18)

## 2022-02-19 LAB — MRSA NEXT GEN BY PCR, NASAL: MRSA by PCR Next Gen: NOT DETECTED

## 2022-02-19 LAB — MAGNESIUM: Magnesium: 2 mg/dL (ref 1.7–2.4)

## 2022-02-19 LAB — PROTIME-INR
INR: 2.2 — ABNORMAL HIGH (ref 0.8–1.2)
Prothrombin Time: 23.9 seconds — ABNORMAL HIGH (ref 11.4–15.2)

## 2022-02-19 LAB — PROCALCITONIN: Procalcitonin: 0.38 ng/mL

## 2022-02-19 LAB — APTT: aPTT: 39 seconds — ABNORMAL HIGH (ref 24–36)

## 2022-02-19 MED ORDER — HYDRALAZINE HCL 20 MG/ML IJ SOLN: 5.0000 mg | Freq: Four times a day (QID) | INTRAMUSCULAR | Status: DC | PRN

## 2022-02-19 MED ORDER — VITAMIN B-12 1000 MCG PO TABS
500.0000 ug | ORAL_TABLET | Freq: Every day | ORAL | Status: DC
  Administered 2022-02-20 – 2022-02-23 (×4): 500 ug via ORAL
  Filled 2022-02-19 (×4): qty 1

## 2022-02-19 MED ORDER — FERROUS SULFATE 325 (65 FE) MG PO TABS
325.0000 mg | ORAL_TABLET | Freq: Every day | ORAL | Status: DC
  Administered 2022-02-20 – 2022-02-23 (×4): 325 mg via ORAL
  Filled 2022-02-19 (×4): qty 1

## 2022-02-19 MED ORDER — SODIUM CHLORIDE 0.9 % IV SOLN
500.0000 mg | INTRAVENOUS | Status: DC
  Administered 2022-02-19 – 2022-02-20 (×2): 500 mg via INTRAVENOUS
  Filled 2022-02-19 (×2): qty 5

## 2022-02-19 MED ORDER — PYRIDOSTIGMINE BROMIDE 60 MG PO TABS
60.0000 mg | ORAL_TABLET | Freq: Three times a day (TID) | ORAL | Status: DC
  Administered 2022-02-19 – 2022-02-23 (×10): 60 mg via ORAL
  Filled 2022-02-19 (×14): qty 1

## 2022-02-19 MED ORDER — GUAIFENESIN-DM 100-10 MG/5ML PO SYRP: 10.0000 mL | ORAL_SOLUTION | ORAL | Status: DC | PRN

## 2022-02-19 MED ORDER — DEXAMETHASONE SODIUM PHOSPHATE 10 MG/ML IJ SOLN
6.0000 mg | Freq: Once | INTRAMUSCULAR | Status: AC
  Administered 2022-02-19: 6 mg via INTRAVENOUS
  Filled 2022-02-19: qty 1

## 2022-02-19 MED ORDER — APIXABAN 5 MG PO TABS
5.0000 mg | ORAL_TABLET | Freq: Two times a day (BID) | ORAL | Status: DC
  Administered 2022-02-19 – 2022-02-23 (×7): 5 mg via ORAL
  Filled 2022-02-19 (×8): qty 1

## 2022-02-19 MED ORDER — IPRATROPIUM-ALBUTEROL 20-100 MCG/ACT IN AERS
1.0000 | INHALATION_SPRAY | Freq: Four times a day (QID) | RESPIRATORY_TRACT | Status: DC
  Administered 2022-02-19 – 2022-02-23 (×14): 1 via RESPIRATORY_TRACT
  Filled 2022-02-19: qty 4

## 2022-02-19 MED ORDER — ACETAMINOPHEN 325 MG PO TABS: 650.0000 mg | ORAL_TABLET | Freq: Four times a day (QID) | ORAL | Status: DC | PRN

## 2022-02-19 MED ORDER — IPRATROPIUM BROMIDE 0.02 % IN SOLN: 0.5000 mg | Freq: Four times a day (QID) | RESPIRATORY_TRACT | Status: DC | PRN

## 2022-02-19 MED ORDER — TAMSULOSIN HCL 0.4 MG PO CAPS
0.4000 mg | ORAL_CAPSULE | Freq: Every day | ORAL | Status: DC
  Administered 2022-02-20 – 2022-02-23 (×4): 0.4 mg via ORAL
  Filled 2022-02-19 (×4): qty 1

## 2022-02-19 MED ORDER — HYDROCOD POLI-CHLORPHE POLI ER 10-8 MG/5ML PO SUER: 5.0000 mL | Freq: Two times a day (BID) | ORAL | Status: DC | PRN

## 2022-02-19 MED ORDER — SENNOSIDES-DOCUSATE SODIUM 8.6-50 MG PO TABS: 1.0000 | ORAL_TABLET | Freq: Every evening | ORAL | Status: DC | PRN

## 2022-02-19 MED ORDER — ROSUVASTATIN CALCIUM 10 MG PO TABS
10.0000 mg | ORAL_TABLET | Freq: Every day | ORAL | Status: DC
  Administered 2022-02-19 – 2022-02-21 (×3): 10 mg via ORAL
  Filled 2022-02-19 (×4): qty 1

## 2022-02-19 MED ORDER — ONDANSETRON HCL 4 MG PO TABS: 4.0000 mg | ORAL_TABLET | Freq: Four times a day (QID) | ORAL | Status: DC | PRN

## 2022-02-19 MED ORDER — PERFLUTREN LIPID MICROSPHERE
1.0000 mL | INTRAVENOUS | Status: AC | PRN
  Administered 2022-02-19: 2 mL via INTRAVENOUS

## 2022-02-19 MED ORDER — VITAMIN C 500 MG PO TABS
500.0000 mg | ORAL_TABLET | Freq: Every day | ORAL | Status: DC
  Administered 2022-02-19 – 2022-02-23 (×5): 500 mg via ORAL
  Filled 2022-02-19 (×5): qty 1

## 2022-02-19 MED ORDER — DEXAMETHASONE 4 MG PO TABS
6.0000 mg | ORAL_TABLET | Freq: Every day | ORAL | Status: DC
  Administered 2022-02-20 – 2022-02-23 (×4): 6 mg via ORAL
  Filled 2022-02-19 (×4): qty 2

## 2022-02-19 MED ORDER — POTASSIUM CHLORIDE CRYS ER 20 MEQ PO TBCR
60.0000 meq | EXTENDED_RELEASE_TABLET | Freq: Every day | ORAL | Status: DC
  Administered 2022-02-20 – 2022-02-23 (×4): 60 meq via ORAL
  Filled 2022-02-19 (×4): qty 3

## 2022-02-19 MED ORDER — MIDODRINE HCL 5 MG PO TABS
10.0000 mg | ORAL_TABLET | Freq: Three times a day (TID) | ORAL | Status: DC
  Administered 2022-02-19 – 2022-02-23 (×10): 10 mg via ORAL
  Filled 2022-02-19 (×9): qty 2

## 2022-02-19 MED ORDER — SODIUM CHLORIDE 0.9% FLUSH
3.0000 mL | Freq: Two times a day (BID) | INTRAVENOUS | Status: DC
  Administered 2022-02-19 – 2022-02-23 (×9): 3 mL via INTRAVENOUS

## 2022-02-19 MED ORDER — SODIUM CHLORIDE 0.9 % IV SOLN
3.0000 g | Freq: Four times a day (QID) | INTRAVENOUS | Status: DC
  Administered 2022-02-19 – 2022-02-23 (×17): 3 g via INTRAVENOUS
  Filled 2022-02-19 (×5): qty 3
  Filled 2022-02-19: qty 8
  Filled 2022-02-19: qty 3
  Filled 2022-02-19 (×3): qty 8
  Filled 2022-02-19: qty 3
  Filled 2022-02-19 (×2): qty 8
  Filled 2022-02-19 (×2): qty 3
  Filled 2022-02-19 (×2): qty 8
  Filled 2022-02-19: qty 3

## 2022-02-19 MED ORDER — ALBUTEROL SULFATE (2.5 MG/3ML) 0.083% IN NEBU: 2.5000 mg | INHALATION_SOLUTION | Freq: Four times a day (QID) | RESPIRATORY_TRACT | Status: DC | PRN

## 2022-02-19 MED ORDER — TRAZODONE HCL 50 MG PO TABS
25.0000 mg | ORAL_TABLET | Freq: Every evening | ORAL | Status: DC | PRN
  Filled 2022-02-19: qty 1

## 2022-02-19 MED ORDER — SODIUM CHLORIDE 0.9 % IV SOLN: 6.0000 mg | Freq: Once | INTRAVENOUS | Status: DC

## 2022-02-19 MED ORDER — DORZOLAMIDE HCL-TIMOLOL MAL PF 22.3-6.8 MG/ML OP SOLN: 1.0000 [drp] | Freq: Two times a day (BID) | OPHTHALMIC | Status: DC

## 2022-02-19 MED ORDER — ACETAMINOPHEN 650 MG RE SUPP: 650.0000 mg | Freq: Four times a day (QID) | RECTAL | Status: DC | PRN

## 2022-02-19 MED ORDER — ONDANSETRON HCL 4 MG/2ML IJ SOLN: 4.0000 mg | Freq: Four times a day (QID) | INTRAMUSCULAR | Status: DC | PRN

## 2022-02-19 MED ORDER — LATANOPROST 0.005 % OP SOLN
1.0000 [drp] | Freq: Every day | OPHTHALMIC | Status: DC
  Administered 2022-02-19 – 2022-02-21 (×4): 1 [drp] via OPHTHALMIC
  Filled 2022-02-19: qty 2.5

## 2022-02-19 MED ORDER — MORPHINE SULFATE (PF) 2 MG/ML IV SOLN: 1.0000 mg | Freq: Four times a day (QID) | INTRAVENOUS | Status: DC | PRN

## 2022-02-19 MED ORDER — DORZOLAMIDE HCL-TIMOLOL MAL 2-0.5 % OP SOLN
1.0000 [drp] | Freq: Two times a day (BID) | OPHTHALMIC | Status: DC
  Administered 2022-02-19 – 2022-02-23 (×8): 1 [drp] via OPHTHALMIC
  Filled 2022-02-19: qty 10

## 2022-02-19 MED ORDER — METOPROLOL SUCCINATE ER 25 MG PO TB24
12.5000 mg | ORAL_TABLET | Freq: Every day | ORAL | Status: DC
  Administered 2022-02-20 – 2022-02-23 (×4): 12.5 mg via ORAL
  Filled 2022-02-19 (×4): qty 1

## 2022-02-19 MED ORDER — HYDROCODONE-ACETAMINOPHEN 5-325 MG PO TABS: 1.0000 | ORAL_TABLET | ORAL | Status: DC | PRN

## 2022-02-19 MED ORDER — TORSEMIDE 20 MG PO TABS
10.0000 mg | ORAL_TABLET | Freq: Every day | ORAL | Status: DC
  Administered 2022-02-20 – 2022-02-23 (×4): 10 mg via ORAL
  Filled 2022-02-19 (×4): qty 1

## 2022-02-19 MED ORDER — BENZONATATE 100 MG PO CAPS: 100.0000 mg | ORAL_CAPSULE | Freq: Three times a day (TID) | ORAL | Status: DC | PRN

## 2022-02-19 MED ORDER — PANTOPRAZOLE SODIUM 40 MG PO TBEC
40.0000 mg | DELAYED_RELEASE_TABLET | Freq: Every day | ORAL | Status: DC
  Administered 2022-02-20 – 2022-02-23 (×4): 40 mg via ORAL
  Filled 2022-02-19 (×4): qty 1

## 2022-02-19 MED ORDER — CALCIUM CARBONATE ANTACID 500 MG PO CHEW
600.0000 mg | CHEWABLE_TABLET | Freq: Every day | ORAL | Status: DC
  Administered 2022-02-20 – 2022-02-23 (×4): 600 mg via ORAL
  Filled 2022-02-19 (×4): qty 3

## 2022-02-19 MED ORDER — ZINC SULFATE 220 (50 ZN) MG PO CAPS
220.0000 mg | ORAL_CAPSULE | Freq: Every day | ORAL | Status: DC
  Administered 2022-02-19 – 2022-02-23 (×5): 220 mg via ORAL
  Filled 2022-02-19 (×5): qty 1

## 2022-02-19 MED ORDER — ASPIRIN 325 MG PO TBEC: 325.0000 mg | DELAYED_RELEASE_TABLET | Freq: Every day | ORAL | Status: DC

## 2022-02-19 MED ORDER — BISACODYL 5 MG PO TBEC: 5.0000 mg | DELAYED_RELEASE_TABLET | Freq: Every day | ORAL | Status: DC | PRN

## 2022-02-19 NOTE — Progress Notes (Signed)
Progress Note   Patient: Chris Kent LFY:[id number] DOB: 30-Sep-1935 DOA: 02/18/2022     1 DOS: the patient was seen and examined on 02/19/2022   Brief hospital course: Taken from H&P.   Chris Kent is a 87 y.o. male with a known history of dementia, anxiety, arthritis, asthma, CHF newly on 2 L of O2 at night and as needed, CAD, GERD, CVA, hypertension, hyperlipidemia, peripheral vascular disease and OSA presents to the emergency department for evaluation of shortness of breath which acutely get worse when he woke up this morning.  Over the past few days patient has been requiring more oxygen and also having some difficulty swallowing.  Of note patient has esophageal dysmotility which causes regular choking of food, liquids, and pills. He has had aspiration pneumonia in the past.  .  I spoke with his daughter Chris Kent by phone who states that he is followed at [facility], a feeding tube has been recommended which he has declined.  He has a follow up appointment on 1/25.  He does not have special diet orders   Patient denies fevers/chills, weakness, dizziness, chest pain, shortness of breath, N/V/C/D, abdominal pain, dysuria/frequency, changes in mental status.   ED course.  On presentation afebrile, HR 116, RR 26 and blood pressure of 95/51, hypoxic with saturation of 79% on room air, requiring 5 L of oxygen to improve saturation to 93%.  Labs pertinent for neutrophilic predominant leukocytosis at 20.5, T. bili of 2.0, UA unremarkable, lactic acid 2, troponin 168>>338, BNP 193 Respiratory panel positive for COVID-19 PCR. CXR with coarsened interstitial markings in both lungs with patchy opacities in the lung bases, right worse than left, suspicious for multifocal infection. CTA chest with no acute PE, chronic left lower lobe embolus is unchanged.  Also noted multifocal bilateral airspace opacities, greatest in the right lower lobe concerning for multifocal pneumonia.  Patulous and diabetes containing  esophagus and small pericardial effusion. Also noted to have unchanged 4 mm right middle lobe nodule.  No follow-up required. Patient was started on remdesivir along with broad-spectrum antibiotic with Zosyn and vancomycin.  1/16: Mild hypothermia with temperature of 97.3, blood pressure 90/53 and saturating 96% on 5 L of oxygen.  CBC with little worsening of leukocytosis to 23, platelet decreased to 143, CMP with albumin of 2.6 and T. bili of 1.6. Lipid panel mostly normal, INR 2.2, lactic acidosis resolved.  Troponin with a small upward trend 168>>338>>437>>353>>299, will get limited echocardiogram to rule out any wall motion abnormalities, most likely mismatch demand and supply and less concern of ACS.  Procalcitonin at 0.38 and MRSA swab was negative Preliminary blood culture negative in 12 hours. Switching antibiotics to Unasyn and Zithromax.  Also adding Decadron for 1 week due to increased oxygen requirement. Patient has an history of chronic esophageal dysmotility and aspirations.  Swallow team placed their recommendations for dysphagia diet.  Patient is high risk for aspiration but would like to continue eating stating that he coughs way more than here while eating at home and is seeing a physician at [facility].  Palliative care was also consulted.  Assessment and Plan: * Acute on chronic respiratory failure with hypoxia [facility]) Patient presented with worsening oxygen requirement, uses 2 L at baseline, mostly at night and as needed.  Currently on 5 L of oxygen.  History of COPD Multifactorial with recent COVID-19 pneumonia with superadded bacterial infection.  Also high risk for aspiration, also concern of aspiration pneumonia. -Continue with supplemental oxygen-wean to baseline as tolerated  Sepsis with acute hypoxic respiratory failure without septic shock [facility]) Patient met sepsis criteria with leukocytosis, tachycardia and tachypnea.  Mild lactic acidosis which has been resolved.  Most likely  secondary to COVID-19 pneumonia with superadded bacterial infection/aspiration pneumonia as patient is high risk for aspiration.  He was started on broad-spectrum antibiotics which include Zosyn and vancomycin.  Procalcitonin at 0.38 and MRSA PCR negative. Patient is also high risk for aspiration based on his history of chronic esophageal dysmotility which is being managed at [facility] and frequent aspirations. Swallow team placed the recommendations as patient would like to continue eating and knows the risk. -Switching antibiotics to Unasyn and Zithromax. -Palliative care consult. -Continue with supportive care  At high risk for aspiration Chronic issue, discussed with daughter on phone and family is aware that he is high risk for a major aspiration which can result in termination of life. They would like to involve his pulmonologist Dr. Murtis Sink sent to Dr. Lanney Gins -Palliative care consult  COVID-19 virus infection Patient was placed on remdesivir due to increased oxygen requirement which can be multifactorial at this time. -Continue with remdesivir -Add Decadron for 1 week  -Continue with supportive care -Continue with supplemental oxygen-wean to baseline as tolerated  Chronic diastolic CHF (congestive heart failure) ([place]) Clinically appears euvolemic with mildly elevated BNP at 193. Most recent echocardiogram done on 02/21/2021 was within normal limit. -Continue home low-dose torsemide. -Continue home metoprolol   History of orthostatic hypotension Patient was on midodrine and Florinef at home, not sure whether he was taking Florinef or not, so it was not continued.  Patient is also on Decadron at this time. -Continue midodrine  CAD (coronary artery disease) Elevated troponin.  No chest pain.  Troponin peaked at 437 and started trending down.  Most likely secondary to mismatch between demand and supply with acute on chronic respiratory failure with hypoxia, pneumonia and COVID  infection. -Limited echocardiogram to rule out any wall motion abnormalities. -No need for heparin infusion at this time  Pulmonary emboli [facility]) CTA chest done yesterday was negative for acute PE. -Continue home Eliquis  Hyperlipidemia -Continue statin  Benign prostatic hyperplasia without lower urinary tract symptoms -Continue home Flomax   Subjective: Patient was seen and examined today.  Denies any chest pain, no shortness of breath while lying down, no orthopnea, no nausea, vomiting or diarrhea.  Per patient he only uses 1 to 2 L of oxygen at baseline.  Denies any sick contacts.  Per patient he is vaccinated.  Physical Exam: Vitals:   02/18/22 2343 02/19/22 0100 02/19/22 0616 02/19/22 0823  BP: (!) 85/50  (!) 90/53 (!) 90/48  Pulse: 95  81 82  Resp: '18  19 18  '$ Temp: 99.1 F (37.3 C)  (!) 97.3 F (36.3 C) 97.6 F (36.4 C)  TempSrc:    Oral  SpO2: 94%  96% 98%  Weight:  81.6 kg    Height:  '5\' 9"'$  (1.753 m)     General.  Frail elderly man, in no acute distress. Pulmonary.  Few scattered rhonchi bilaterally, normal respiratory effort. CV.  Regular rate and rhythm, no JVD, rub or murmur. Abdomen.  Soft, nontender, nondistended, BS positive. CNS.  Alert and oriented .  No focal neurologic deficit. Extremities.  No edema, no cyanosis, pulses intact and symmetrical. Psychiatry.  Judgment and insight appears normal.   Data Reviewed: Prior data reviewed  Family Communication: Talked with daughter on phone.  Disposition: Status is: Inpatient Remains inpatient appropriate because: Severity of  illness  Planned Discharge Destination: Home with Home Health  DVT prophylaxis.  Eliquis Time spent: 50 minutes  This record has been created using Systems analyst. Errors have been sought and corrected,but may not always be located. Such creation errors do not reflect on the standard of care.   Author: Lorella Nimrod, MD 02/19/2022 1:07 PM  For on call review  [email or web address].

## 2022-02-19 NOTE — Assessment & Plan Note (Signed)
-  Continue statin

## 2022-02-19 NOTE — Assessment & Plan Note (Addendum)
Chronic issue, discussed with daughter on phone and family is aware that he is high risk for a major aspiration which can result in termination of life. They would like to involve his pulmonologist Dr. Murtis Sink sent to Dr. Lanney Gins -Palliative care consult

## 2022-02-19 NOTE — Assessment & Plan Note (Signed)
Patient was on midodrine and Florinef at home, not sure whether he was taking Florinef or not, so it was not continued.  Patient is also on Decadron at this time. -Continue midodrine

## 2022-02-19 NOTE — Assessment & Plan Note (Signed)
CTA chest done yesterday was negative for acute PE. -Continue home Eliquis

## 2022-02-19 NOTE — Evaluation (Addendum)
Clinical/Bedside Swallow Evaluation Patient Details  Name: Chris Kent MRN: [id number] Date of Birth: [birth year]  Today's Date: 02/19/2022 Time: SLP Start Time (ACUTE ONLY): 1132 SLP Stop Time (ACUTE ONLY): 7829 SLP Time Calculation (min) (ACUTE ONLY): 51 min  Past Medical History:  Past Medical History:  Diagnosis Date   Anxiety    Arthritis    Asthma    CHF (congestive heart failure) (HCC)    Coronary artery disease    GERD (gastroesophageal reflux disease)    Glaucoma    Glaucoma    History of stroke    Hyperlipidemia    Hyperlipidemia    Hypertension    Peripheral vascular disease ([place])    Sleep apnea    Stroke [facility])    Past Surgical History:  Past Surgical History:  Procedure Laterality Date   ESOPHAGOGASTRODUODENOSCOPY (EGD) WITH PROPOFOL N/A 07/19/2021   Procedure: ESOPHAGOGASTRODUODENOSCOPY (EGD) WITH PROPOFOL;  Surgeon: Annamaria Helling, DO;  Location: [facility] ENDOSCOPY;  Service: Gastroenterology;  Laterality: N/A;   REPLACEMENT TOTAL KNEE BILATERAL     HPI:  Pt is a 87 y.o. male admitted to the hospial w/ fdx'd COVID+ with a known history of dementia, anxiety, arthritis, asthma, CHF newly on 2 L of O2 at night and as needed, CAD, Esophageal phase Dysmotility w/ Vomiting, GERD, CVA, hypertension, hyperlipidemia, peripheral vascular disease and OSA presents to the emergency department for evaluation of shortness of breath.  Patient was in a usual state of health until this morning when he woke up with complaints of shortness of breath.  Over the past few days patient has been requiring more oxygen.  Patient states only that he was having increased shortness of breath.    Of note patient has Esophageal dysmotility (see [facility] chart notes) which causes regular choking of food, liquids, and pills. He has had aspiration pneumonia in the past.  I spoke with his daughter June by phone who states that he is followed at [facility], a feeding tube has been recommended which he has  declined.  He has a follow up appointment on 02/28/22 w/ GI for further testing.  He does not have special diet orders he follows despite being given instructions by GI previously.     Chest CT Imaging: Multifocal bilateral airspace opacities, greatest in the right  lower lobe, concerning for multifocal pneumonia.  3. Patulous and debris containing esophagus.  4. Small pericardial effusion.  5. Unchanged 4 mm right middle lobe nodule.  No follow-up required.    Assessment / Plan / Recommendation  Clinical Impression   Pt seen for BSE today. Pt sitting in chair post PT session; verbal and followed general instructions w/ cue. Pt exhibited signs of distractions and had min paucity of speech; baseline Dementia per chart notes.  On [place] O2 5L; WBC elevated; afebrile.    OF NOTE: Pt has a BASELINE dx of severe Esophageal phase Dysmotility w reported Vomiting, retrograde, issues when eating/drinking. Per chart notes including [facility] notes, a DG Esophagus study revealed: "Esophageal motility: Severe dysmotility is visualized throughout the entire esophagus, with lack of primary contraction and diffuse tertiary contractions seen; Esophageal stasis is seen due to severe dysmotility. Technically limited exam due to patient immobility(cervical fusion), inability to perform bolus swallowing, and nausea with episode of vomiting during the exam. No evidence of vestibular penetration or aspiration during swallowing.".  Also see the EGD(Endoscopy) study 07/2021.  PT IS AT INCREASED RISK FOR ASPIRATION OF REFLUX MATERIAL.      At  the BSE, pt appeared to present w/ functional oropharyngeal phase swallowing function w/ no immediate, s/s of overt oropharyngeal phase dysphagia nor decline in pulmonary presentation w/ trials given; no overt neuromuscular swallowing deficits impacting the oropharyngeal phase appreciated. Pt is at reduced risk for aspiration from an oropharyngeal phase standpoint following general aspiration  precautions.  HOWEVER, w/ the Baseline of "Severe" Esophageal phase dysmotility (as described above in DG Esophagus study) and GERD/REFLUX, along w/ the  EGD reports, pt is at increased risk for aspiration of REFLUX material during vomiting. ANY Dysmotility or Regurgitation of food/liquid/Reflux material can increase risk for aspiration of the Reflux material AFTERWARD during Retrograde flow thus impact Voicing and Pulmonary status, and even leading to pneumonia.  Pt described ongoing issues w/ Regurgitation/Vomiting for several months, w/ worsening in recent months -- pt even described having to vomit when at a restaurant. Noted GI chart notes have addressed this w/ him giving him instructions on safer eating strategies -- pt could only recall 2 w/ cues(impact from Cognitive decline?).    Pt sat upright in chair(H&N ROM limited d/t cervical fusion baseline) and consumed trials of thin liquids Via Cup, puree and a soft solids w/ no immediate, overt clinical s/s of aspiration noted; clear vocal quality b/t trials. But, an intermittent cough b/t trials was noted -- unsure if related to Esophageal phase Dysmotility or retrograde flow. No decline in pulmonary status occurred overall w/ po intake. Oral phase appeared [facility] for bolus management, mastication of softened solids(small bites), and timely A-P transfer/clearing of material. Oral clearing achieved w/ all consistencies.  OM exam was [facility] for lingual/labial movements. No unilateral weakness. Speech rushed/mumbled slightly but intelligible when he slowed down. Noted dry mouth and thick secretions(removed w/ oral care).   OF NOTE: STRICT feeding trials followed: 2 small bites, 2 small sips via cup then REST BREAK. This was repeated each time.    Recommend f/u w/ MD and Palliative Care tem and pt/Family to thoroughly discuss [place], and pt's decision to eat/drink despite Esophageal phase Dysmotility and risk for aspiration of REFLUX material. If taking an oral  diet, then continue his reported current diet of a mech soft diet w/ meats minced and gravies; thin liquids VIA CUP - less air swallowed. STRICT REFLUX precautions; aspiration precautions. Supervision during meals recommended for follow through w/ strategies and precautions.  Recommend f/u w/ GI for ongoing management/tx and education. Recommend Palliative Care f/u for [place] discussion w/ pt and Family d/t Baseline dxs.  MD consulted on above and agreed; ST services will sign off at this time w/ MD to reconsult ST services if any new needs while admitted. Frequent oral care for hygiene and stimulation of swallowing. NSG updated. MD agreed. SLP Visit Diagnosis: Dysphagia, pharyngoesophageal phase (R13.14) (Esophageal phase Dysmotility dx'd baseline; baseline Dementia)    Aspiration Risk  Moderate aspiration risk;Risk for inadequate nutrition/hydration (d/t Esophageal phase Dysmotility and Retrograde flow/Vomiting)    Diet Recommendation   mech soft diet w/ meats minced and gravies; thin liquids VIA CUP - less air swallowed. STRICT REFLUX precautions; aspiration precautions. Supervision during meals recommended for follow through w/ strategies and precautions.  Medication Administration: Crushed with puree    Other  Recommendations Recommended Consults: Consider GI evaluation;Consider esophageal assessment (Palliative Care for [place] discussion w/ pt/family; Dietician) Oral Care Recommendations: Oral care BID;Oral care before and after PO;Patient independent with oral care (setup) Other Recommendations:  (n/a)    Recommendations for follow up therapy are one component of a multi-disciplinary  discharge planning process, led by the attending physician.  Recommendations may be updated based on patient status, additional functional criteria and insurance authorization.  Follow up Recommendations No SLP follow up - pt appears at his reported Baseline and wishes to continue to eat/drink w/ the understanding of  his risk for aspiration/aspiration pneumonia from his dx'd Esophageal phase Dysmotility.      Assistance Recommended at Discharge  full  Functional Status Assessment Patient has had a recent decline in their functional status and/or demonstrates limited ability to make significant improvements in function in a reasonable and predictable amount of time  Frequency and Duration  (n/a)   (n/a)       Prognosis Prognosis for Safe Diet Advancement: Guarded Barriers to Reach Goals: Time post onset;Severity of deficits;Cognitive deficits Barriers/Prognosis Comment: baseline Esophageal phase Dysmotility and retrograde flow issues; baseline Dementia      Swallow Study   General Date of Onset: 02/18/22 HPI: Pt is a 87 y.o. male admitted to the [facility] w/ fdx'd COVID+ with a known history of dementia, anxiety, arthritis, asthma, CHF newly on 2 L of O2 at night and as needed, CAD, Esophageal phase Dysmotility w/ Vomiting, GERD, CVA, hypertension, hyperlipidemia, peripheral vascular disease and OSA presents to the emergency department for evaluation of shortness of breath.  Patient was in a usual state of health until this morning when he woke up with complaints of shortness of breath.  Over the past few days patient has been requiring more oxygen.  Patient states only that he was having increased shortness of breath.   Of note patient has Esophageal dysmotility (see [facility] chart notes) which causes regular choking of food, liquids, and pills. He has had aspiration pneumonia in the past.  I spoke with his daughter June by phone who states that he is followed at [facility], a feeding tube has been recommended which he has declined.  He has a follow up appointment on 02/28/22 w/ GI for further testing.  He does not have special diet orders he follows despite being given instructions by GI previously.   Chest CT Imaging: Multifocal bilateral airspace opacities, greatest in the right  lower lobe, concerning for multifocal  pneumonia.  3. Patulous and debris containing esophagus.  4. Small pericardial effusion.  5. Unchanged 4 mm right middle lobe nodule.  No follow-up required. Type of Study: Bedside Swallow Evaluation Previous Swallow Assessment: 07/2021 Diet Prior to this Study: NPO Temperature Spikes Noted: No (wbc 23.0) Respiratory Status: Nasal cannula (5L) History of Recent Intubation: No Behavior/Cognition: Alert;Cooperative;Pleasant mood;Distractible;Requires cueing (baseline Dementia) Oral Cavity Assessment: Dry (thick secretions removed) Oral Care Completed by SLP: Yes Oral Cavity - Dentition: Missing dentition Vision: Functional for self-feeding Self-Feeding Abilities: Able to feed self;Needs set up (cues) Patient Positioning: Upright in chair Baseline Vocal Quality:  (Gravely, wet intermittently) Volitional Cough: Strong;Congested Volitional Swallow: Able to elicit    Oral/Motor/Sensory Function Overall Oral Motor/Sensory Function: Within functional limits   Ice Chips Ice chips: Within functional limits Presentation: Spoon (3 trials)   Thin Liquid Thin Liquid: Impaired Presentation: Cup;Self Fed (15+ trials) Oral Phase Impairments:  (n/a) Pharyngeal  Phase Impairments: Cough - Delayed (x2-3 intermittently b/t trials) Other Comments: unsure if related to Esophageal dysmotility and/or any retrograde flow of material    Nectar Thick Nectar Thick Liquid: Not tested   Honey Thick Honey Thick Liquid: Not tested   Puree Puree: Within functional limits Presentation: Spoon;Self Fed (10 trials) Other Comments: throat clearing x1 - unsure if related to  Esophageal dysmotility and/or any retrograde flow of material   Solid     Solid: Impaired (missing dentition) Presentation: Self Fed;Spoon (8 trials) Oral Phase Impairments: Impaired mastication (missing dentition) Pharyngeal Phase Impairments:  (no immediate) Other Comments: unsure if related to Esophageal dysmotility and/or any retrograde flow of  material        Orinda Kenner, MS, CCC-SLP Speech Language Pathologist Rehab Services; [place] [phone] (ascom) Jeslin Bazinet 02/19/2022,12:24 PM

## 2022-02-19 NOTE — Hospital Course (Addendum)
Taken from H&P.   Chris Kent is a 87 y.o. male with a known history of dementia, anxiety, arthritis, asthma, CHF newly on 2 L of O2 at night and as needed, CAD, GERD, CVA, hypertension, hyperlipidemia, peripheral vascular disease and OSA presents to the emergency department for evaluation of shortness of breath which acutely get worse when he woke up this morning.  Over the past few days patient has been requiring more oxygen and also having some difficulty swallowing.  Of note patient has esophageal dysmotility which causes regular choking of food, liquids, and pills. He has had aspiration pneumonia in the past.  .  I spoke with his daughter June by phone who states that he is followed at [facility], a feeding tube has been recommended which he has declined.  He has a follow up appointment on 1/25.  He does not have special diet orders   Patient denies fevers/chills, weakness, dizziness, chest pain, shortness of breath, N/V/C/D, abdominal pain, dysuria/frequency, changes in mental status.   ED course.  On presentation afebrile, HR 116, RR 26 and blood pressure of 95/51, hypoxic with saturation of 79% on room air, requiring 5 L of oxygen to improve saturation to 93%.  Labs pertinent for neutrophilic predominant leukocytosis at 20.5, T. bili of 2.0, UA unremarkable, lactic acid 2, troponin 168>>338, BNP 193 Respiratory panel positive for COVID-19 PCR. CXR with coarsened interstitial markings in both lungs with patchy opacities in the lung bases, right worse than left, suspicious for multifocal infection. CTA chest with no acute PE, chronic left lower lobe embolus is unchanged.  Also noted multifocal bilateral airspace opacities, greatest in the right lower lobe concerning for multifocal pneumonia.  Patulous and diabetes containing esophagus and small pericardial effusion. Also noted to have unchanged 4 mm right middle lobe nodule.  No follow-up required. Patient was started on remdesivir along with  broad-spectrum antibiotic with Zosyn and vancomycin.  1/16: Mild hypothermia with temperature of 97.3, blood pressure 90/53 and saturating 96% on 5 L of oxygen.  CBC with little worsening of leukocytosis to 23, platelet decreased to 143, CMP with albumin of 2.6 and T. bili of 1.6. Lipid panel mostly normal, INR 2.2, lactic acidosis resolved.  Troponin with a small upward trend 168>>338>>437>>353>>299, will get limited echocardiogram to rule out any wall motion abnormalities, most likely mismatch demand and supply and less concern of ACS.  Procalcitonin at 0.38 and MRSA swab was negative Preliminary blood culture negative in 12 hours. Switching antibiotics to Unasyn and Zithromax.  Also adding Decadron for 1 week due to increased oxygen requirement. Patient has an history of chronic esophageal dysmotility and aspirations.  Swallow team placed their recommendations for dysphagia diet.  Patient is high risk for aspiration but would like to continue eating stating that he coughs way more than here while eating at home and is seeing a physician at [facility].  Palliative care was also consulted.

## 2022-02-19 NOTE — Evaluation (Signed)
Physical Therapy Evaluation Patient Details Name: Chris Kent MRN: [id number] DOB: 1935-11-20 Today's Date: 02/19/2022  History of Present Illness  87 y/o male presented to ED on 02/18/22 for SOB and weakness. Tested positive for COVID. Admitted for acute respiratory failure with sepsis 2/2 COVID and multifocal pneumonia. PMH: dementia, asthma, CAD, CHF, CVA, HTN, PVD, OSA, glaucoma  Clinical Impression  Patient admitted with the above. PTA, patient lives with grandson and reports independence with mobility with no AD but uses SPC for community distances. Patient presents with weakness, impaired balance, and decreased activity tolerance. Able to complete bed mobility with supervision and sit to stand with min guard and no AD. Took pivotal steps towards recliner with no AD and min guard but demos wide BOS and unsteadiness. SpO2 94-95% on 5L O2 with mobility and at rest. Patient will benefit from skilled PT services during acute stay to address listed deficits. Recommend HHPT at discharge to maximize functional independence and safety.        Recommendations for follow up therapy are one component of a multi-disciplinary discharge planning process, led by the attending physician.  Recommendations may be updated based on patient status, additional functional criteria and insurance authorization.  Follow Up Recommendations Home health PT      Assistance Recommended at Discharge Frequent or constant Supervision/Assistance  Patient can return home with the following  A little help with walking and/or transfers;A little help with bathing/dressing/bathroom;Assistance with cooking/housework;Direct supervision/assist for medications management;Assist for transportation;Direct supervision/assist for financial management;Help with stairs or ramp for entrance    Equipment Recommendations Rolling Autie Vasudevan (2 wheels)  Recommendations for Other Services       Functional Status Assessment Patient has had a  recent decline in their functional status and demonstrates the ability to make significant improvements in function in a reasonable and predictable amount of time.     Precautions / Restrictions Precautions Precautions: Fall Precaution Comments: COVID+ Restrictions Weight Bearing Restrictions: No      Mobility  Bed Mobility Overal bed mobility: Needs Assistance Bed Mobility: Supine to Sit     Supine to sit: Supervision, HOB elevated     General bed mobility comments: supervision for safety. HOB elevated    Transfers Overall transfer level: Needs assistance Equipment used: None Transfers: Sit to/from Stand Sit to Stand: Min guard           General transfer comment: able to use momentum to come into standing. Declines use of RW    Ambulation/Gait Ambulation/Gait assistance: Min guard Gait Distance (Feet): 4 Feet Assistive device: None Gait Pattern/deviations: Decreased stride length, Step-to pattern, Wide base of support Gait velocity: decreased     General Gait Details: wide BOS with small steps towards recliner. Min guard for safety. Demos unsteadiness with short transfer distance  Financial trader Rankin (Stroke Patients Only)       Balance Overall balance assessment: Needs assistance Sitting-balance support: No upper extremity supported, Feet supported Sitting balance-Leahy Scale: Fair     Standing balance support: No upper extremity supported, During functional activity Standing balance-Leahy Scale: Fair                               Pertinent Vitals/Pain Pain Assessment Pain Assessment: Faces Faces Pain Scale: No hurt Pain Intervention(s): Monitored during session    Home Living Family/patient expects to be discharged to::  Private residence Living Arrangements: Other relatives (grandson) Available Help at Discharge: Family Type of Home: House Home Access: Stairs to enter   Engineer, site of Steps: 1   Home Layout: One level Home Equipment: Conservation officer, nature (2 wheels);Cane - single point      Prior Function Prior Level of Function : Independent/Modified Independent             Mobility Comments: reports independent with no AD; uses SPC for community distances.       Hand Dominance        Extremity/Trunk Assessment   Upper Extremity Assessment Upper Extremity Assessment: Generalized weakness    Lower Extremity Assessment Lower Extremity Assessment: Generalized weakness    Cervical / Trunk Assessment Cervical / Trunk Assessment: Kyphotic  Communication   Communication: HOH  Cognition Arousal/Alertness: Awake/alert Behavior During Therapy: WFL for tasks assessed/performed Overall Cognitive Status: History of cognitive impairments - at baseline                                 General Comments: hx of dementia. A&Ox3. Follows commands appropriately.        General Comments General comments (skin integrity, edema, etc.): on 5L O2, spO2 94-95% with mobility and at rest    Exercises     Assessment/Plan    PT Assessment Patient needs continued PT services  PT Problem List Decreased strength;Decreased activity tolerance;Decreased mobility;Decreased balance;Decreased safety awareness;Decreased knowledge of use of DME;Cardiopulmonary status limiting activity;Decreased knowledge of precautions       PT Treatment Interventions DME instruction;Gait training;Functional mobility training;Therapeutic activities;Therapeutic exercise;Balance training;Patient/family education    PT Goals (Current goals can be found in the Care Plan section)  Acute Rehab PT Goals Patient Stated Goal: to go home PT Goal Formulation: With patient Time For Goal Achievement: 03/05/22 Potential to Achieve Goals: Good    Frequency Min 2X/week     Co-evaluation               AM-PAC PT "6 Clicks" Mobility  Outcome Measure Help needed turning  from your back to your side while in a flat bed without using bedrails?: A Little Help needed moving from lying on your back to sitting on the side of a flat bed without using bedrails?: A Little Help needed moving to and from a bed to a chair (including a wheelchair)?: A Little Help needed standing up from a chair using your arms (e.g., wheelchair or bedside chair)?: A Little Help needed to walk in hospital room?: A Little Help needed climbing 3-5 steps with a railing? : A Little 6 Click Score: 18    End of Session Equipment Utilized During Treatment: Oxygen Activity Tolerance: Patient tolerated treatment well Patient left: in chair;with call bell/phone within reach;with chair alarm set Nurse Communication: Mobility status PT Visit Diagnosis: Unsteadiness on feet (R26.81);Muscle weakness (generalized) (M62.81);Other abnormalities of gait and mobility (R26.89)    Time: 7341-9379 PT Time Calculation (min) (ACUTE ONLY): 23 min   Charges:   PT Evaluation $PT Eval Moderate Complexity: 1 Mod PT Treatments $Therapeutic Activity: 8-22 mins        Raihana Balderrama A. Gilford Rile PT, DPT [facility] - Acute Rehabilitation Services   Elpidia Karn A Verlia Kaney 02/19/2022, 1:24 PM

## 2022-02-19 NOTE — Assessment & Plan Note (Signed)
Patient was placed on remdesivir due to increased oxygen requirement which can be multifactorial at this time. -Continue with remdesivir -Add Decadron for 1 week  -Continue with supportive care -Continue with supplemental oxygen-wean to baseline as tolerated

## 2022-02-19 NOTE — Assessment & Plan Note (Signed)
Patient met sepsis criteria with leukocytosis, tachycardia and tachypnea.  Mild lactic acidosis which has been resolved.  Most likely secondary to COVID-19 pneumonia with superadded bacterial infection/aspiration pneumonia as patient is high risk for aspiration.  He was started on broad-spectrum antibiotics which include Zosyn and vancomycin.  Procalcitonin at 0.38 and MRSA PCR negative. Patient is also high risk for aspiration based on his history of chronic esophageal dysmotility which is being managed at [facility] and frequent aspirations. Swallow team placed the recommendations as patient would like to continue eating and knows the risk. -Switching antibiotics to Unasyn and Zithromax. -Palliative care consult. -Continue with supportive care

## 2022-02-19 NOTE — Assessment & Plan Note (Signed)
Elevated troponin.  No chest pain.  Troponin peaked at 437 and started trending down.  Most likely secondary to mismatch between demand and supply with acute on chronic respiratory failure with hypoxia, pneumonia and COVID infection. -Limited echocardiogram to rule out any wall motion abnormalities. -No need for heparin infusion at this time

## 2022-02-19 NOTE — Assessment & Plan Note (Signed)
Clinically appears euvolemic with mildly elevated BNP at 193. Most recent echocardiogram done on 02/21/2021 was within normal limit. -Continue home low-dose torsemide. -Continue home metoprolol

## 2022-02-19 NOTE — Progress Notes (Signed)
On call provider notified of SVT 14 beats per tele obs (Reported [id number].) in addition to intermittent sinus brady 35-39.  New orders received.  Isolation also noted to be changed to Airborne and contact per COVID protocol.

## 2022-02-19 NOTE — Progress Notes (Signed)
Scheduled PO medications not given due to pt's history of coughing and spitting up medications with the previous shift RN; SLP evaluation pending in [facility]

## 2022-02-19 NOTE — Progress Notes (Signed)
  Transition of Care (TOC) Screening Note   Patient Details  Name: Chris Kent Date of Birth: [birth year]   Transition of Care [facility]) CM/SW Contact:    Quin Hoop, LCSW Phone Number: 02/19/2022, 10:06 AM    Transition of Care Department [facility]) has reviewed patient and no TOC needs have been identified at this time. We will continue to monitor patient advancement through interdisciplinary progression rounds. If new patient transition needs arise, please place a TOC consult.

## 2022-02-19 NOTE — Assessment & Plan Note (Signed)
Patient presented with worsening oxygen requirement, uses 2 L at baseline, mostly at night and as needed.  Currently on 5 L of oxygen.  History of COPD Multifactorial with recent COVID-19 pneumonia with superadded bacterial infection.  Also high risk for aspiration, also concern of aspiration pneumonia. -Continue with supplemental oxygen-wean to baseline as tolerated

## 2022-02-19 NOTE — Assessment & Plan Note (Signed)
-  Continue home Flomax 

## 2022-02-19 NOTE — Progress Notes (Signed)
  Echocardiogram 2D Echocardiogram has been performed.  Chris Kent 02/19/2022, 4:30 PM

## 2022-02-20 ENCOUNTER — Encounter: Payer: Self-pay | Admitting: Family Medicine

## 2022-02-20 DIAGNOSIS — Z7189 Other specified counseling: Secondary | ICD-10-CM

## 2022-02-20 DIAGNOSIS — K224 Dyskinesia of esophagus: Secondary | ICD-10-CM | POA: Diagnosis not present

## 2022-02-20 DIAGNOSIS — Z515 Encounter for palliative care: Secondary | ICD-10-CM | POA: Diagnosis not present

## 2022-02-20 DIAGNOSIS — J9621 Acute and chronic respiratory failure with hypoxia: Secondary | ICD-10-CM | POA: Diagnosis not present

## 2022-02-20 LAB — COMPREHENSIVE METABOLIC PANEL
ALT: 11 U/L (ref 0–44)
AST: 15 U/L (ref 15–41)
Albumin: 2.2 g/dL — ABNORMAL LOW (ref 3.5–5.0)
Alkaline Phosphatase: 66 U/L (ref 38–126)
Anion gap: 8 (ref 5–15)
BUN: 29 mg/dL — ABNORMAL HIGH (ref 8–23)
CO2: 30 mmol/L (ref 22–32)
Calcium: 8.3 mg/dL — ABNORMAL LOW (ref 8.9–10.3)
Chloride: 107 mmol/L (ref 98–111)
Creatinine, Ser: 0.99 mg/dL (ref 0.61–1.24)
GFR, Estimated: >60 mL/min (ref >60)
Glucose, Bld: 127 mg/dL — ABNORMAL HIGH (ref 70–99)
Potassium: 4 mmol/L (ref 3.5–5.1)
Sodium: 145 mmol/L (ref 135–145)
Total Bilirubin: 0.8 mg/dL (ref 0.3–1.2)
Total Protein: 5.3 g/dL — ABNORMAL LOW (ref 6.5–8.1)

## 2022-02-20 LAB — CBC WITH DIFFERENTIAL/PLATELET
Abs Immature Granulocytes: 0.11 10*3/uL — ABNORMAL HIGH (ref 0.00–0.07)
Basophils Absolute: 0 10*3/uL (ref 0.0–0.1)
Basophils Relative: 0 %
Eosinophils Absolute: 0 10*3/uL (ref 0.0–0.5)
Eosinophils Relative: 0 %
HCT: 42.1 % (ref 39.0–52.0)
Hemoglobin: 12.6 g/dL — ABNORMAL LOW (ref 13.0–17.0)
Immature Granulocytes: 1 %
Lymphocytes Relative: 11 %
Lymphs Abs: 2.3 10*3/uL (ref 0.7–4.0)
MCH: 29.8 pg (ref 26.0–34.0)
MCHC: 29.9 g/dL — ABNORMAL LOW (ref 30.0–36.0)
MCV: 99.5 fL (ref 80.0–100.0)
Monocytes Absolute: 0.8 10*3/uL (ref 0.1–1.0)
Monocytes Relative: 4 %
Neutro Abs: 18.7 10*3/uL — ABNORMAL HIGH (ref 1.7–7.7)
Neutrophils Relative %: 84 %
Platelets: 152 10*3/uL (ref 150–400)
RBC: 4.23 MIL/uL (ref 4.22–5.81)
RDW: 16.1 % — ABNORMAL HIGH (ref 11.5–15.5)
WBC: 22 10*3/uL — ABNORMAL HIGH (ref 4.0–10.5)
nRBC: 0 % (ref 0.0–0.2)

## 2022-02-20 LAB — PROCALCITONIN: Procalcitonin: 0.23 ng/mL

## 2022-02-20 LAB — MAGNESIUM: Magnesium: 2.2 mg/dL (ref 1.7–2.4)

## 2022-02-20 LAB — PHOSPHORUS: Phosphorus: 4 mg/dL (ref 2.5–4.6)

## 2022-02-20 LAB — C-REACTIVE PROTEIN: CRP: 19.5 mg/dL — ABNORMAL HIGH (ref <1.0)

## 2022-02-20 NOTE — TOC Transition Note (Signed)
Transition of Care [facility]) - CM/SW Discharge Note   Patient Details  Name: Chris Kent MRN: [id number] Date of Birth: 03/12/1935  Transition of Care [facility]) CM/SW Contact:  Quin Hoop, LCSW Phone Number: 02/20/2022, 10:08 AM   Clinical Narrative:    TOC assessment completed with patient via phone due to contact precautions.  Patient states that his PCP is Dr. Doy Hutching.  His grandson, Dominica Severin, takes him to his appointments.  Patients gets medications filled at [facility] on [address].  He has no concerns about affording his meds.  Patient states that he has the following DME at home:  RW and cane.    No OT/RN recs received at this time.     Barriers to Discharge: Continued Medical Work up   Patient Goals and CMS Choice      Discharge Placement                         Discharge Plan and Services Additional resources added to the After Visit Summary for                            [facility] Arranged: PT HH Agency: [place] Date [place]: 02/20/22      Social Determinants of Health (SDOH) Interventions SDOH Screenings   Food Insecurity: No Food Insecurity (02/19/2022)  Housing: Low Risk  (02/19/2022)  Transportation Needs: No Transportation Needs (02/19/2022)  Utilities: Not At Risk (02/19/2022)  Depression (PHQ2-9): Low Risk  (09/04/2021)  Tobacco Use: Low Risk  (02/20/2022)     Readmission Risk Interventions     No data to display

## 2022-02-20 NOTE — Consult Note (Signed)
Consultation Note Date: 02/20/2022   Patient Name: Chris Kent  DOB: February 10, 1935  MRN: [id number]  Age / Sex: 87 y.o., male  PCP: Idelle Crouch, MD Referring Physician: Tomma Rakers*  Reason for Consultation: Establishing goals of care  HPI/Patient Profile: 87 y.o. male  with past medical history of dementia, stroke, HTN, HLD, PVD, CAD, CHF, 2L oxygen chronically, OSA,arthritis, asthma, anxiety admitted on 02/18/2022 with shortness of breath related to COVID and multifocal pneumonia.   Clinical Assessment and Goals of Care: Chart reviewed. Discussed with RN. I met with Chris Kent who is sitting up on side of bed and feeding himself potato soup. He is alert and able to converse with me (but more interested in his soup). He tells me that his grandson lives with him at home. He tells me that he uses 1L oxygen but mostly just at night. He does have chronic issues with choking on his food but he enjoys eating and is accepting of the risks. We discussed that this could lead to further decline and what he would want and he confirms that he is okay with oxygen and medications but would not want to be escalated to ICU level care for extraordinary measures and he would want medication to ensure he is not suffering. He does share that he knows his time is becoming limited but also "I hope I can squeeze out a few more years." He gives me permission to call and speak with daughter, Chris Kent.   I called and had good conversation with Chris Kent. Chris Kent has good understanding of the severity of her father's condition. She reports that he has been unable to keep much of anything down and has lost ~60+ pounds. She knows that he is not staying hydrated as he should and we did review that his kidney function is suprisingly normal at this time. Chris Kent voices concern about him worsening and re reviewed labs and WBC increased but likely  secondary to steroid treatment. He is coming down on his oxygen. He was less responsive to Chris Kent when she was here this morning but he was much more responsive to me this afternoon. I explained to Chris Kent that he is extremely high risk for worsening but at this time it is too soon to know how well he will do from this acute illness. I did express my concern that even if he does stabilize his overall prognosis is poor due to poor swallow function and risk of aspiration. She says that he does not want feeding tube but was supposed to have appt at [facility] to discuss this further. He enjoys eating and he goes out to dinner with his grandson every night. I discussed my concern that a feeding tube may prolong his life but I would not expect this to improve his quality of life and it may prolong his suffering.   We discussed the potential benefits of having hospice support at home and they are open to consideration but we need time to see how he does first. She  reports that he follows with Dr. Virgina Jock, GI. She also expresses desire to have suction in the home at time of discharge. I did explain that equipment such as suction is easier to get from hospice but we can work with TOC to help with home needs when we know more what to expect. Chris Kent has good understanding and is open to further conversation. She desires comfort and quality of life for her father as a priority.   All questions/concerns addressed. Emotional support provided. Updated RN and Dr. Hollice Gong.   Primary Decision Maker PATIENT can contribute with assistance in decisions from daughter    SUMMARY OF RECOMMENDATIONS   - DNR - Continue conservative treatment - No escalation to ICU (no intubation, no BiPAP) - Allow diet as this is surprisingly still enjoyable to him even though he often coughs up his food - Consider comfort if he declines - Considering hospice at discharge - will need further conversation  Code Status/Advance Care  Planning: DNR   Symptom Management:  Per attending.   Prognosis:  Overall prognosis poor.   Discharge Planning: To Be Determined      Primary Diagnoses: Present on Admission:  Acute on chronic respiratory failure with hypoxia ([place])  Sepsis with acute hypoxic respiratory failure without septic shock ([place])  COVID-19 virus infection  CAD (coronary artery disease)  Benign prostatic hyperplasia without lower urinary tract symptoms  Chronic diastolic CHF (congestive heart failure) ([place])  Pulmonary emboli ([place])  Hyperlipidemia   I have reviewed the medical record, interviewed the patient and family, and examined the patient. The following aspects are pertinent.  Past Medical History:  Diagnosis Date   Anxiety    Arthritis    Asthma    CHF (congestive heart failure) ([facility])    Coronary artery disease    GERD (gastroesophageal reflux disease)    Glaucoma    Glaucoma    History of stroke    Hyperlipidemia    Hyperlipidemia    Hypertension    Peripheral vascular disease ([place])    Sleep apnea    Stroke [facility])    Social History   Socioeconomic History   Marital status: Widowed    Spouse name: Not on file   Number of children: Not on file   Years of education: Not on file   Highest education level: Not on file  Occupational History   Not on file  Tobacco Use   Smoking status: Never   Smokeless tobacco: Never  Vaping Use   Vaping Use: Never used  Substance and Sexual Activity   Alcohol use: No   Drug use: No   Sexual activity: Yes    Birth control/protection: None  Other Topics Concern   Not on file  Social History Narrative   Not on file   Social Determinants of Health   Financial Resource Strain: Not on file  Food Insecurity: No Food Insecurity (02/19/2022)   Hunger Vital Sign    Worried About Running Out of Food in the Last Year: Never true    Ran Out of Food in the Last Year: Never true  Transportation Needs: No Transportation Needs (02/19/2022)   PRAPARE  - Hydrologist (Medical): No    Lack of Transportation (Non-Medical): No  Physical Activity: Not on file  Stress: Not on file  Social Connections: Not on file   Family History  Problem Relation Age of Onset   Prostate cancer Father    Scheduled Meds:  apixaban  5  mg Oral BID   vitamin C  500 mg Oral Daily   calcium carbonate  600 mg of elemental calcium Oral Q breakfast   cyanocobalamin  500 mcg Oral Daily   dexamethasone  6 mg Oral Daily   dorzolamide-timolol  1 drop Right Eye BID   ferrous sulfate  325 mg Oral Q breakfast   Ipratropium-Albuterol  1 puff Inhalation Q6H   latanoprost  1 drop Both Eyes QHS   metoprolol succinate  12.5 mg Oral Daily   midodrine  10 mg Oral TID with meals   pantoprazole  40 mg Oral Daily   potassium chloride SA  60 mEq Oral Daily   pyridostigmine  60 mg Oral TID   rosuvastatin  10 mg Oral QHS   sodium chloride flush  3 mL Intravenous Q12H   tamsulosin  0.4 mg Oral Daily   torsemide  10 mg Oral Daily   zinc sulfate  220 mg Oral Daily   Continuous Infusions:  ampicillin-sulbactam (UNASYN) IV 3 g (02/20/22 0626)   azithromycin 500 mg (02/19/22 2305)   remdesivir 100 mg in sodium chloride 0.9 % 100 mL IVPB 100 mg (02/19/22 2219)   PRN Meds:.acetaminophen **OR** acetaminophen, albuterol, benzonatate, bisacodyl, chlorpheniramine-HYDROcodone, guaiFENesin-dextromethorphan, hydrALAZINE, HYDROcodone-acetaminophen, ipratropium, morphine injection, ondansetron **OR** ondansetron (ZOFRAN) IV, senna-docusate, traZODone No Known Allergies Review of Systems  Constitutional:  Positive for activity change and fatigue.  HENT:  Positive for trouble swallowing.   Respiratory:  Negative for shortness of breath.   Neurological:  Positive for weakness.    Physical Exam Vitals and nursing note reviewed.  Constitutional:      General: He is not in acute distress. Cardiovascular:     Rate and Rhythm: Normal rate.  Pulmonary:      Effort: No tachypnea, accessory muscle usage or respiratory distress.  Abdominal:     General: Abdomen is flat.  Neurological:     Mental Status: He is alert.     Comments: Mostly oriented and appropriate during my conversation just not interested in much discussion.      Vital Signs: BP (!) 104/55 (BP Location: Left Arm)   Pulse 63   Temp 98 F (36.7 C)   Resp 17   Ht '5\' 9"'$  (1.753 m)   Wt 81.6 kg   SpO2 99%   BMI 26.57 kg/m  Pain Scale: 0-10   Pain Score: 0-No pain   SpO2: SpO2: 99 % O2 Device:SpO2: 99 % O2 Flow Rate: .O2 Flow Rate (L/min): 4 L/min  IO: Intake/output summary:  Intake/Output Summary (Last 24 hours) at 02/20/2022 0931 Last data filed at 02/19/2022 2216 Gross per 24 hour  Intake 153 ml  Output --  Net 153 ml    LBM: Last BM Date : 02/18/22 Baseline Weight: Weight: 81 kg Most recent weight: Weight: 81.6 kg     Palliative Assessment/Data:     Time In: 1200  Time Total: 85 min  Greater than 50%  of this time was spent counseling and coordinating care related to the above assessment and plan.  Signed by: Vinie Sill, NP Palliative Medicine Team Pager # [phone] (M-F 8a-5p) Team Phone # [phone] (Nights/Weekends)

## 2022-02-20 NOTE — Progress Notes (Signed)
Patient coughed up a large amount of break fast.patient and his family know about that and  also continue to intake orally.

## 2022-02-20 NOTE — Progress Notes (Signed)
Patient delivered dinner tray. Patient and family aware of aspiration risks associated with intake and continue to request patient to have ability to eat/drink as desired. Both attending MD and palliative care team aware.

## 2022-02-20 NOTE — Progress Notes (Signed)
patient coughed up copious amount of white-yellow thick sputum about 5 minutes after giving medications. patient required oral suctioning. stable now. MD notified.

## 2022-02-20 NOTE — Progress Notes (Addendum)
PROGRESS NOTE  Chris Kent GNF:[id number] DOB: 07-31-1935 DOA: 02/18/2022 PCP: Idelle Crouch, MD  Hospital Course/Subjective:  Chris Kent is a 87 y.o. male with a known history of dementia, anxiety, arthritis, asthma, CHF newly on 2 L of O2 at night and as needed, CAD, GERD, CVA, hypertension, hyperlipidemia, peripheral vascular disease and OSA presents to the emergency department for evaluation of shortness of breath was diagnosed with COVID infection, aspiration PNA.  Seen this AM resting on 4L. He tells me that at baseline he wears only 1L [place] oxygen when he is sleeping but doesn't wear oxygen during the day. Denies any significant cough, chest pain or other complaints today.  Assessment/Plan:  Principal Problem:   Acute on chronic respiratory failure with hypoxia (HCC) Active Problems:   Sepsis with acute hypoxic respiratory failure without septic shock (HCC)   At high risk for aspiration   COVID-19 virus infection   Chronic diastolic CHF (congestive heart failure) (HCC)   History of orthostatic hypotension   CAD (coronary artery disease)   Pulmonary emboli (HCC)   Hyperlipidemia   Benign prostatic hyperplasia without lower urinary tract symptoms   Assessment and Plan: * Acute on chronic respiratory failure with hypoxia ([place])  Patient presented with worsening oxygen requirement, uses 1 L at baseline, mostly at night and as needed.  Currently on 4L of oxygen.  History of COPD Multifactorial with recent COVID-19 pneumonia with superadded bacterial infection.  Also high risk for aspiration, also concern of aspiration pneumonia. -Continue with supplemental oxygen-wean to baseline as tolerated   Sepsis with acute hypoxic respiratory failure without septic shock [facility]) Patient met sepsis criteria with leukocytosis, tachycardia and tachypnea.  Mild lactic acidosis which has been resolved.  Most likely secondary to COVID-19 pneumonia with superadded bacterial  infection/aspiration pneumonia as patient is high risk for aspiration.  He was started on broad-spectrum antibiotics which include Zosyn and vancomycin.  Procalcitonin at 0.38 and MRSA PCR negative. Patient is also high risk for aspiration based on his history of chronic esophageal dysmotility which is being managed at [facility] and frequent aspirations. Swallow team placed the recommendations as patient would like to continue eating and knows the risk. -currently on Unasyn and Zithromax. -Palliative care consult pending. -Continue with supportive care   At high risk for aspiration Chronic issue, previous MD discussed with daughter on phone and family is aware that he is high risk for a major aspiration which can result in termination of life. They would like to involve his pulmonologist Dr. Murtis Sink sent to Dr. Lanney Gins by Dr. Reesa Chew -Palliative care consult has been requested   COVID-19 virus infection Patient was placed on remdesivir due to increased oxygen requirement which can be multifactorial at this time. -discussed with ID Dr. Baxter Flattery who based on very low positivity of PCR test suggests COVID maybe in recovery rather than acute infection. As such, will DC remdesivir. -Decadron for 1 week  -Continue with supportive care -Continue with supplemental oxygen-wean to baseline as tolerated   Chronic diastolic CHF (congestive heart failure) (HCC) Clinically appears euvolemic with mildly elevated BNP at 193. Most recent echocardiogram done on 02/21/2021 was within normal limit. -Continue home low-dose torsemide. -Continue home metoprolol    History of orthostatic hypotension Patient was on midodrine and Florinef at home, not sure whether he was taking Florinef or not, so it was not continued.  Patient is also on Decadron at this time. -Continue midodrine   CAD (coronary artery disease) Elevated troponin.  No chest  pain.  Troponin peaked at 437 and started trending down.  Most likely  secondary to mismatch between demand and supply with acute on chronic respiratory failure with hypoxia, pneumonia and COVID infection. -Limited echocardiogram 1/16 to rule out any wall motion abnormalities, results below   Pulmonary emboli (HCC) CTA chest done yesterday was negative for acute PE. -Continue home Eliquis   Hyperlipidemia -Continue statin   Benign prostatic hyperplasia without lower urinary tract symptoms -Continue home Flomax  DVT Prophylaxis: Eliquis  Code Status: DNR   Family Communication: None present this AM, patient is awake and oriented.   Disposition Plan: Home with HH.   Consultants: Pulmonary   Procedures: None   Antimicrobials: Anti-infectives (From admission, onward)    Start     Dose/Rate Route Frequency Ordered Stop   02/19/22 2300  vancomycin (VANCOREADY) IVPB 1500 mg/300 mL  Status:  Discontinued        1,500 mg 150 mL/hr over 120 Minutes Intravenous Every 24 hours 02/18/22 2254 02/19/22 1030   02/19/22 2200  remdesivir 100 mg in sodium chloride 0.9 % 100 mL IVPB        100 mg 200 mL/hr over 30 Minutes Intravenous Every 24 hours 02/18/22 2228 02/21/22 2159   02/19/22 2200  azithromycin (ZITHROMAX) 500 mg in sodium chloride 0.9 % 250 mL IVPB        500 mg 250 mL/hr over 60 Minutes Intravenous Every 24 hours 02/19/22 1034 02/23/22 2159   02/19/22 1200  Ampicillin-Sulbactam (UNASYN) 3 g in sodium chloride 0.9 % 100 mL IVPB        3 g 200 mL/hr over 30 Minutes Intravenous Every 6 hours 02/19/22 1030     02/18/22 2300  piperacillin-tazobactam (ZOSYN) IVPB 3.375 g  Status:  Discontinued        3.375 g 12.5 mL/hr over 240 Minutes Intravenous Every 8 hours 02/18/22 2254 02/19/22 1030   02/18/22 2300  vancomycin (VANCOREADY) IVPB 1750 mg/350 mL        1,750 mg 175 mL/hr over 120 Minutes Intravenous  Once 02/18/22 2254 02/19/22 0703   02/18/22 2257  remdesivir 100 mg in sodium chloride 0.9 % 100 mL IVPB       See Hyperspace for full Linked Orders  Report.   100 mg 200 mL/hr over 30 Minutes Intravenous  Once 02/18/22 2228 02/19/22 0452   02/18/22 2230  remdesivir 100 mg in sodium chloride 0.9 % 100 mL IVPB       See Hyperspace for full Linked Orders Report.   100 mg 200 mL/hr over 30 Minutes Intravenous  Once 02/18/22 2228 02/19/22 0327   02/18/22 2130  cefTRIAXone (ROCEPHIN) 1 g in sodium chloride 0.9 % 100 mL IVPB        1 g 200 mL/hr over 30 Minutes Intravenous  Once 02/18/22 2119 02/18/22 2255   02/18/22 2130  azithromycin (ZITHROMAX) 500 mg in sodium chloride 0.9 % 250 mL IVPB        500 mg 250 mL/hr over 60 Minutes Intravenous  Once 02/18/22 2119 02/19/22 0015       Objective: Vitals:   02/19/22 2100 02/19/22 2103 02/20/22 0612 02/20/22 0822  BP: (!) 98/53 (!) 107/57 (!) 97/53 (!) 104/55  Pulse: 82 81 63 63  Resp: (!) 22 (!) '22 15 17  '$ Temp: 97.6 F (36.4 C) 98.2 F (36.8 C) 97.6 F (36.4 C) 98 F (36.7 C)  TempSrc: Oral  Oral   SpO2: 97% 98% 95% 99%  Weight:  Height:        Intake/Output Summary (Last 24 hours) at 02/20/2022 0836 Last data filed at 02/19/2022 2216 Gross per 24 hour  Intake 153 ml  Output --  Net 153 ml   Filed Weights   02/18/22 1855 02/19/22 0100  Weight: 81 kg 81.6 kg   Exam: General:  Alert, oriented, calm, in no acute distress, sitting up in bed on 4L [place] Eyes: EOMI, clear sclerea Neck: supple, no masses, trachea mildline  Cardiovascular: RRR, no murmurs or rubs, no peripheral edema  Respiratory: clear to auscultation bilaterally, no wheezes, no crackles  Abdomen: soft, nontender, nondistended, normal bowel tones heard  Skin: dry, no rashes  Musculoskeletal: no joint effusions, normal range of motion  Psychiatric: appropriate affect, normal speech  Neurologic: extraocular muscles intact, clear speech, moving all extremities with intact sensorium   Data Reviewed: CBC: Recent Labs  Lab 02/18/22 1907 02/19/22 0255 02/20/22 0327  WBC 20.5* 23.0* 22.0*  NEUTROABS 16.9*  --   18.7*  HGB 15.2 13.2 12.6*  HCT 49.1 42.1 42.1  MCV 96.7 97.7 99.5  PLT 171 143* 161   Basic Metabolic Panel: Recent Labs  Lab 02/18/22 1907 02/19/22 0255 02/20/22 0327  NA 144 143 145  K 4.0 4.2 4.0  CL 102 105 107  CO2 32 30 30  GLUCOSE 150* 121* 127*  BUN 21 20 29*  CREATININE 1.01 0.99 0.99  CALCIUM 9.2 8.4* 8.3*  MG  --  2.0 2.2  PHOS  --   --  4.0   GFR: Estimated Creatinine Clearance: 54.6 mL/min (by C-G formula based on SCr of 0.99 mg/dL). Liver Function Tests: Recent Labs  Lab 02/18/22 1907 02/19/22 0255 02/20/22 0327  AST '18 15 15  '$ ALT '12 11 11  '$ ALKPHOS 85 65 66  BILITOT 2.0* 1.6* 0.8  PROT 6.7 5.4* 5.3*  ALBUMIN 3.2* 2.6* 2.2*   No results for input(s): "LIPASE", "AMYLASE" in the last 168 hours. No results for input(s): "AMMONIA" in the last 168 hours. Coagulation Profile: Recent Labs  Lab 02/19/22 0100  INR 2.2*   Cardiac Enzymes: No results for input(s): "CKTOTAL", "CKMB", "CKMBINDEX", "TROPONINI" in the last 168 hours. BNP (last 3 results) No results for input(s): "PROBNP" in the last 8760 hours. HbA1C: No results for input(s): "HGBA1C" in the last 72 hours. CBG: No results for input(s): "GLUCAP" in the last 168 hours. Lipid Profile: Recent Labs    02/19/22 0100  CHOL 71  HDL 32*  LDLCALC 29  TRIG 52  CHOLHDL 2.2   Thyroid Function Tests: No results for input(s): "TSH", "T4TOTAL", "FREET4", "T3FREE", "THYROIDAB" in the last 72 hours. Anemia Panel: No results for input(s): "VITAMINB12", "FOLATE", "FERRITIN", "TIBC", "IRON", "RETICCTPCT" in the last 72 hours. Urine analysis:    Component Value Date/Time   COLORURINE YELLOW (A) 02/19/2022 0130   APPEARANCEUR CLEAR (A) 02/19/2022 0130   APPEARANCEUR Clear 11/21/2017 1011   LABSPEC >1.046 (H) 02/19/2022 0130   LABSPEC 1.013 09/20/2012 0122   PHURINE 6.0 02/19/2022 0130   GLUCOSEU NEGATIVE 02/19/2022 0130   GLUCOSEU Negative 09/20/2012 0122   HGBUR NEGATIVE 02/19/2022 0130    BILIRUBINUR NEGATIVE 02/19/2022 0130   BILIRUBINUR Negative 11/21/2017 1011   BILIRUBINUR Negative 09/20/2012 0122   KETONESUR NEGATIVE 02/19/2022 0130   PROTEINUR NEGATIVE 02/19/2022 0130   NITRITE NEGATIVE 02/19/2022 0130   LEUKOCYTESUR NEGATIVE 02/19/2022 0130   LEUKOCYTESUR Negative 09/20/2012 0122   Sepsis Labs: '@LABRCNTIP'$ (procalcitonin:4,lacticidven:4)  ) Recent Results (from the past 240 hour(s))  Resp  panel by RT-PCR (RSV, Flu A&B, Covid) Anterior Nasal Swab     Status: Abnormal   Collection Time: 02/18/22  7:08 PM   Specimen: Anterior Nasal Swab  Result Value Ref Range Status   SARS Coronavirus 2 by RT PCR POSITIVE (A) NEGATIVE Final    Comment: (NOTE) SARS-CoV-2 target nucleic acids are DETECTED.  The SARS-CoV-2 RNA is generally detectable in upper respiratory specimens during the acute phase of infection. Positive results are indicative of the presence of the identified virus, but do not rule out bacterial infection or co-infection with other pathogens not detected by the test. Clinical correlation with patient history and other diagnostic information is necessary to determine patient infection status. The expected result is Negative.  Fact Sheet for Patients: EntrepreneurPulse.com.au  Fact Sheet for Healthcare Providers: IncredibleEmployment.be  This test is not yet approved or cleared by the [place] FDA and  has been authorized for detection and/or diagnosis of SARS-CoV-2 by FDA under an Emergency Use Authorization (EUA).  This EUA will remain in effect (meaning this test can be used) for the duration of  the COVID-19 declaration under Section 564(b)(1) of the A ct, 21 U.S.C. section 360bbb-3(b)(1), unless the authorization is terminated or revoked sooner.     Influenza A by PCR NEGATIVE NEGATIVE Final   Influenza B by PCR NEGATIVE NEGATIVE Final    Comment: (NOTE) The Xpert Xpress SARS-CoV-2/FLU/RSV plus assay is  intended as an aid in the diagnosis of influenza from Nasopharyngeal swab specimens and should not be used as a sole basis for treatment. Nasal washings and aspirates are unacceptable for Xpert Xpress SARS-CoV-2/FLU/RSV testing.  Fact Sheet for Patients: EntrepreneurPulse.com.au  Fact Sheet for Healthcare Providers: IncredibleEmployment.be  This test is not yet approved or cleared by the [place] FDA and has been authorized for detection and/or diagnosis of SARS-CoV-2 by FDA under an Emergency Use Authorization (EUA). This EUA will remain in effect (meaning this test can be used) for the duration of the COVID-19 declaration under Section 564(b)(1) of the Act, 21 U.S.C. section 360bbb-3(b)(1), unless the authorization is terminated or revoked.     Resp Syncytial Virus by PCR NEGATIVE NEGATIVE Final    Comment: (NOTE) Fact Sheet for Patients: EntrepreneurPulse.com.au  Fact Sheet for Healthcare Providers: IncredibleEmployment.be  This test is not yet approved or cleared by the [place] FDA and has been authorized for detection and/or diagnosis of SARS-CoV-2 by FDA under an Emergency Use Authorization (EUA). This EUA will remain in effect (meaning this test can be used) for the duration of the COVID-19 declaration under Section 564(b)(1) of the Act, 21 U.S.C. section 360bbb-3(b)(1), unless the authorization is terminated or revoked.  Performed at [facility], [address] [place], [place] [address]   Blood culture (routine x 2)     Status: None (Preliminary result)   Collection Time: 02/18/22  7:14 PM   Specimen: BLOOD  Result Value Ref Range Status   Specimen Description BLOOD RIGHT ANTECUBITAL  Final   Special Requests   Final    BOTTLES DRAWN AEROBIC AND ANAEROBIC Blood Culture results may not be optimal due to an excessive volume of blood received in culture bottles   Culture    Final    NO GROWTH 2 DAYS Performed at [facility], [address], [place],  [address]    Report Status PENDING  Incomplete  Blood culture (routine x 2)     Status: None (Preliminary result)   Collection Time: 02/18/22  8:42  PM   Specimen: BLOOD  Result Value Ref Range Status   Specimen Description BLOOD RIGHT UPPER ARM  Final   Special Requests   Final    BOTTLES DRAWN AEROBIC AND ANAEROBIC Blood Culture adequate volume   Culture   Final    NO GROWTH 2 DAYS Performed at [facility], [address], [place], [place] [address]    Report Status PENDING  Incomplete  MRSA Next Gen by PCR, Nasal     Status: None   Collection Time: 02/19/22  8:05 AM   Specimen: Nasal Mucosa; Nasal Swab  Result Value Ref Range Status   MRSA by PCR Next Gen NOT DETECTED NOT DETECTED Final    Comment: (NOTE) The GeneXpert MRSA Assay (FDA approved for NASAL specimens only), is one component of a comprehensive MRSA colonization surveillance program. It is not intended to diagnose MRSA infection nor to guide or monitor treatment for MRSA infections. Test performance is not FDA approved in patients less than 8 years old. Performed at [facility], [address] [place], [place] [address]      Studies: ECHOCARDIOGRAM LIMITED  Result Date: 02/19/2022    ECHOCARDIOGRAM LIMITED REPORT   Patient Name:   ELL TISO Date of Exam: 02/19/2022 Medical Rec #:  [id number]        Height:       69.0 in Accession #:    [id number]       Weight:       179.9 lb Date of Birth:  09/21/1935        BSA:          1.975 m Patient Age:    9 years         BP:           90/53 mmHg Patient Gender: M                HR:           69 bpm. Exam Location:  [facility] Procedure: Limited Echo, Cardiac Doppler, Color Doppler and Intracardiac            Opacification Agent Indications:     elevated troponin  History:         Patient has prior history of Echocardiogram examinations, most                   recent 02/21/2021. CHF, CAD; Risk Factors:Hypertension and                  Dyslipidemia.  Sonographer:     Harvie Junior Referring Phys:  [id number] AMIN Diagnosing Phys: Nelva Bush MD  Sonographer Comments: Technically difficult study due to poor echo windows, no parasternal window and suboptimal apical window. Image acquisition challenging due to respiratory motion. IMPRESSIONS  1. Left ventricular ejection fraction, by estimation, is 55 to 60%. The left ventricle has normal function. Left ventricular endocardial border not optimally defined to evaluate regional wall motion. Left ventricular diastolic parameters are consistent with Grade I diastolic dysfunction (impaired relaxation).  2. Right ventricular systolic function was not well visualized. The right ventricular size is not well visualized.  3. A small pericardial effusion is present. The pericardial effusion is surrounding the apex.  4. The inferior vena cava is normal in size with greater than 50% respiratory variability, suggesting right atrial pressure of 3 mmHg. FINDINGS  Left Ventricle: Left ventricular ejection fraction, by estimation, is 55 to 60%. The left ventricle has normal function.  Left ventricular endocardial border not optimally defined to evaluate regional wall motion. Definity contrast agent was given IV to delineate the left ventricular endocardial borders. Left ventricular diastolic parameters are consistent with Grade I diastolic dysfunction (impaired relaxation). Right Ventricle: The right ventricular size is not well visualized. Right vetricular wall thickness was not well visualized. Right ventricular systolic function was not well visualized. Pericardium: A small pericardial effusion is present. The pericardial effusion is surrounding the apex. Aortic Valve: Aortic valve mean gradient measures 2.0 mmHg. Aortic valve peak gradient measures 3.3 mmHg. Venous: The inferior vena cava is normal in size with greater than 50%  respiratory variability, suggesting right atrial pressure of 3 mmHg. IAS/Shunts: No atrial level shunt detected by color flow Doppler.  LV Volumes (MOD) LV vol d, MOD A2C: 43.1 ml Diastology LV vol d, MOD A4C: 84.5 ml LV e' medial:    4.46 cm/s LV vol s, MOD A2C: 19.1 ml LV E/e' medial:  14.6 LV vol s, MOD A4C: 36.4 ml LV e' lateral:   6.31 cm/s LV SV MOD A2C:     24.0 ml LV E/e' lateral: 10.3 LV SV MOD A4C:     84.5 ml LV SV MOD BP:      34.7 ml                             3D Volume EF:                            3D EF:        51 %                            LV EDV:       320 ml                            LV ESV:       158 ml                            LV SV:        161 ml RIGHT VENTRICLE RV S prime:     11.70 cm/s AORTIC VALVE AV Vmax:           91.40 cm/s AV Vmean:          62.600 cm/s AV VTI:            0.165 m AV Peak Grad:      3.3 mmHg AV Mean Grad:      2.0 mmHg LVOT Vmax:         75.50 cm/s LVOT Vmean:        48.200 cm/s LVOT VTI:          0.121 m LVOT/AV VTI ratio: 0.73 MITRAL VALVE MV Area (PHT): 3.54 cm    SHUNTS MV Decel Time: 214 msec    Systemic VTI: 0.12 m MV E velocity: 65.00 cm/s MV A velocity: 76.50 cm/s MV E/A ratio:  0.85 Christopher End MD Electronically signed by Nelva Bush MD Signature Date/Time: 02/19/2022/6:16:54 PM    Final     Scheduled Meds:  apixaban  5 mg Oral BID   vitamin C  500 mg Oral Daily   calcium carbonate  600 mg of elemental calcium Oral Q  breakfast   cyanocobalamin  500 mcg Oral Daily   dexamethasone  6 mg Oral Daily   dorzolamide-timolol  1 drop Right Eye BID   ferrous sulfate  325 mg Oral Q breakfast   Ipratropium-Albuterol  1 puff Inhalation Q6H   latanoprost  1 drop Both Eyes QHS   metoprolol succinate  12.5 mg Oral Daily   midodrine  10 mg Oral TID with meals   pantoprazole  40 mg Oral Daily   potassium chloride SA  60 mEq Oral Daily   pyridostigmine  60 mg Oral TID   rosuvastatin  10 mg Oral QHS   sodium chloride flush  3 mL Intravenous Q12H    tamsulosin  0.4 mg Oral Daily   torsemide  10 mg Oral Daily   zinc sulfate  220 mg Oral Daily   Continuous Infusions:  ampicillin-sulbactam (UNASYN) IV 3 g (02/20/22 0626)   azithromycin 500 mg (02/19/22 2305)   remdesivir 100 mg in sodium chloride 0.9 % 100 mL IVPB 100 mg (02/19/22 2219)    LOS: 2 days   Time spent: 29 minutes  Skylarr Liz Marry Guan, MD [facility] Pager [phone]  If 7PM-7AM, please contact night-coverage [email or web address] Password [facility] 02/20/2022, 8:36 AM

## 2022-02-21 DIAGNOSIS — Z515 Encounter for palliative care: Secondary | ICD-10-CM | POA: Diagnosis not present

## 2022-02-21 DIAGNOSIS — Z7189 Other specified counseling: Secondary | ICD-10-CM | POA: Diagnosis not present

## 2022-02-21 DIAGNOSIS — Z9189 Other specified personal risk factors, not elsewhere classified: Secondary | ICD-10-CM | POA: Diagnosis not present

## 2022-02-21 DIAGNOSIS — J9621 Acute and chronic respiratory failure with hypoxia: Secondary | ICD-10-CM | POA: Diagnosis not present

## 2022-02-21 LAB — COMPREHENSIVE METABOLIC PANEL
ALT: 11 U/L (ref 0–44)
AST: 17 U/L (ref 15–41)
Albumin: 2.3 g/dL — ABNORMAL LOW (ref 3.5–5.0)
Alkaline Phosphatase: 71 U/L (ref 38–126)
Anion gap: 8 (ref 5–15)
BUN: 27 mg/dL — ABNORMAL HIGH (ref 8–23)
CO2: 31 mmol/L (ref 22–32)
Calcium: 8.2 mg/dL — ABNORMAL LOW (ref 8.9–10.3)
Chloride: 107 mmol/L (ref 98–111)
Creatinine, Ser: 0.85 mg/dL (ref 0.61–1.24)
GFR, Estimated: >60 mL/min (ref >60)
Glucose, Bld: 91 mg/dL (ref 70–99)
Potassium: 3.9 mmol/L (ref 3.5–5.1)
Sodium: 146 mmol/L — ABNORMAL HIGH (ref 135–145)
Total Bilirubin: 1 mg/dL (ref 0.3–1.2)
Total Protein: 5.3 g/dL — ABNORMAL LOW (ref 6.5–8.1)

## 2022-02-21 LAB — CBC WITH DIFFERENTIAL/PLATELET
Abs Immature Granulocytes: 0.07 10*3/uL (ref 0.00–0.07)
Basophils Absolute: 0 10*3/uL (ref 0.0–0.1)
Basophils Relative: 0 %
Eosinophils Absolute: 0 10*3/uL (ref 0.0–0.5)
Eosinophils Relative: 0 %
HCT: 41.9 % (ref 39.0–52.0)
Hemoglobin: 13 g/dL (ref 13.0–17.0)
Immature Granulocytes: 1 %
Lymphocytes Relative: 16 %
Lymphs Abs: 2.4 10*3/uL (ref 0.7–4.0)
MCH: 30.3 pg (ref 26.0–34.0)
MCHC: 31 g/dL (ref 30.0–36.0)
MCV: 97.7 fL (ref 80.0–100.0)
Monocytes Absolute: 0.8 10*3/uL (ref 0.1–1.0)
Monocytes Relative: 6 %
Neutro Abs: 11.9 10*3/uL — ABNORMAL HIGH (ref 1.7–7.7)
Neutrophils Relative %: 77 %
Platelets: 160 10*3/uL (ref 150–400)
RBC: 4.29 MIL/uL (ref 4.22–5.81)
RDW: 16.2 % — ABNORMAL HIGH (ref 11.5–15.5)
WBC: 15.2 10*3/uL — ABNORMAL HIGH (ref 4.0–10.5)
nRBC: 0 % (ref 0.0–0.2)

## 2022-02-21 LAB — PROCALCITONIN: Procalcitonin: 0.29 ng/mL

## 2022-02-21 LAB — MAGNESIUM: Magnesium: 2.3 mg/dL (ref 1.7–2.4)

## 2022-02-21 LAB — PHOSPHORUS: Phosphorus: 2.2 mg/dL — ABNORMAL LOW (ref 2.5–4.6)

## 2022-02-21 LAB — C-REACTIVE PROTEIN: CRP: 10.1 mg/dL — ABNORMAL HIGH (ref <1.0)

## 2022-02-21 MED ORDER — AZITHROMYCIN 500 MG PO TABS
500.0000 mg | ORAL_TABLET | Freq: Every day | ORAL | Status: DC
  Administered 2022-02-21: 500 mg via ORAL
  Filled 2022-02-21 (×2): qty 1

## 2022-02-21 NOTE — Progress Notes (Signed)
PROGRESS NOTE  Chris Kent:[id number] DOB: [birth year] DOA: 02/18/2022 PCP: Chris Crouch, MD  Hospital Course/Subjective:  Chris Kent is a 87 y.o. male with a known history of dementia, anxiety, arthritis, asthma, CHF newly on 2 L of O2 at night and as needed, CAD, GERD, CVA, hypertension, hyperlipidemia, peripheral vascular disease and OSA presents to the emergency department for evaluation of shortness of breath was diagnosed with COVID infection, aspiration PNA. He was started on Remdesivir and IV antibiotics, but Remdesivir discontinued 1/17 due to low positivity, per ID recommendation.  Seen this AM he remains on 4L. Spoke with palliative care yesterday. Denies any significant cough, chest pain or other complaints today.  Assessment/Plan:  Principal Problem:   Acute on chronic respiratory failure with hypoxia (HCC) Active Problems:   Sepsis with acute hypoxic respiratory failure without septic shock (HCC)   At high risk for aspiration   COVID-19 virus infection   Chronic diastolic CHF (congestive heart failure) (HCC)   History of orthostatic hypotension   CAD (coronary artery disease)   Pulmonary emboli (HCC)   Hyperlipidemia   Benign prostatic hyperplasia without lower urinary tract symptoms   Assessment and Plan: * Acute on chronic respiratory failure with hypoxia ([place])  Patient presented with worsening oxygen requirement, uses 1 L at baseline, mostly at night and as needed.  Currently on 4L of oxygen.  History of COPD Multifactorial with recent COVID-19 pneumonia with superadded bacterial infection.  Also high risk for aspiration, and concern of aspiration pneumonia. -Continue with supplemental oxygen-wean to baseline as tolerated   Sepsis with acute hypoxic respiratory failure without septic shock [facility]) Patient met sepsis criteria with leukocytosis, tachycardia and tachypnea.  Mild lactic acidosis which has been resolved.  Most likely secondary to COVID-19  pneumonia with superadded bacterial infection/aspiration pneumonia as patient is high risk for aspiration.  He was started on broad-spectrum antibiotics which include Zosyn and vancomycin.  Procalcitonin at 0.38 and MRSA PCR negative. Patient is also high risk for aspiration based on his history of chronic esophageal dysmotility which is being managed at [facility] and frequent aspirations. Swallow team placed the recommendations as patient would like to continue eating and knows the risk. - currently on Unasyn and Zithromax. - Palliative care has discussed with patient and family on 1/17 - Continue with supportive care   At high risk for aspiration Chronic issue, previous MD discussed with daughter on phone and family is aware that he is high risk for a major aspiration which can result in termination of life. They discussed with palliative care on 1/17. They confirmed DNR status, and that they would like him to continue to eat as this gives him a lot of pleasure. They would like to involve his pulmonologist Dr. Murtis Sink sent to Dr. Lanney Gins by Dr. Reesa Chew -Palliative care consult as above   COVID-19 virus infection Patient was placed on remdesivir due to increased oxygen requirement which can be multifactorial at this time. -discussed with ID Dr. Baxter Flattery on 1/17 who based on very low positivity of PCR test suggests COVID maybe in recovery rather than acute infection. As such, we did DC remdesivir. -Decadron for 1 week  -Continue with supportive care -Continue with supplemental oxygen-wean to baseline as tolerated   Chronic diastolic CHF (congestive heart failure) (HCC) Clinically appears euvolemic with mildly elevated BNP at 193. Most recent echocardiogram done on 02/21/2021 was within normal limit. -Continue home low-dose torsemide. -Continue home metoprolol    History of orthostatic hypotension Patient was  on midodrine and Florinef at home, not sure whether he was taking Florinef or not,  so it was not continued.  Patient is also on Decadron at this time. -Continue midodrine   CAD (coronary artery disease) Elevated troponin.  No chest pain.  Troponin peaked at 437 and started trending down.  Most likely secondary to mismatch between demand and supply with acute on chronic respiratory failure with hypoxia, pneumonia and COVID infection. -Limited echocardiogram 1/16 to rule out any wall motion abnormalities, results below   Pulmonary emboli (HCC) CTA chest done yesterday was negative for acute PE. -Continue home Eliquis   Hyperlipidemia -Continue statin   Benign prostatic hyperplasia without lower urinary tract symptoms -Continue home Flomax  DVT Prophylaxis: Eliquis  Code Status: DNR   Family Communication: Discussed with daughter Chris Kent over the phone this AM.  Disposition Plan: Home with [place].   Consultants: Pulmonary   Procedures: None   Antimicrobials: Anti-infectives (From admission, onward)    Start     Dose/Rate Route Frequency Ordered Stop   02/19/22 2300  vancomycin (VANCOREADY) IVPB 1500 mg/300 mL  Status:  Discontinued        1,500 mg 150 mL/hr over 120 Minutes Intravenous Every 24 hours 02/18/22 2254 02/19/22 1030   02/19/22 2200  remdesivir 100 mg in sodium chloride 0.9 % 100 mL IVPB  Status:  Discontinued        100 mg 200 mL/hr over 30 Minutes Intravenous Every 24 hours 02/18/22 2228 02/20/22 1239   02/19/22 2200  azithromycin (ZITHROMAX) 500 mg in sodium chloride 0.9 % 250 mL IVPB        500 mg 250 mL/hr over 60 Minutes Intravenous Every 24 hours 02/19/22 1034 02/23/22 2159   02/19/22 1200  Ampicillin-Sulbactam (UNASYN) 3 g in sodium chloride 0.9 % 100 mL IVPB        3 g 200 mL/hr over 30 Minutes Intravenous Every 6 hours 02/19/22 1030     02/18/22 2300  piperacillin-tazobactam (ZOSYN) IVPB 3.375 g  Status:  Discontinued        3.375 g 12.5 mL/hr over 240 Minutes Intravenous Every 8 hours 02/18/22 2254 02/19/22 1030   02/18/22 2300   vancomycin (VANCOREADY) IVPB 1750 mg/350 mL        1,750 mg 175 mL/hr over 120 Minutes Intravenous  Once 02/18/22 2254 02/19/22 0703   02/18/22 2257  remdesivir 100 mg in sodium chloride 0.9 % 100 mL IVPB       See Hyperspace for full Linked Orders Report.   100 mg 200 mL/hr over 30 Minutes Intravenous  Once 02/18/22 2228 02/19/22 0452   02/18/22 2230  remdesivir 100 mg in sodium chloride 0.9 % 100 mL IVPB       See Hyperspace for full Linked Orders Report.   100 mg 200 mL/hr over 30 Minutes Intravenous  Once 02/18/22 2228 02/19/22 0327   02/18/22 2130  cefTRIAXone (ROCEPHIN) 1 g in sodium chloride 0.9 % 100 mL IVPB        1 g 200 mL/hr over 30 Minutes Intravenous  Once 02/18/22 2119 02/18/22 2255   02/18/22 2130  azithromycin (ZITHROMAX) 500 mg in sodium chloride 0.9 % 250 mL IVPB        500 mg 250 mL/hr over 60 Minutes Intravenous  Once 02/18/22 2119 02/19/22 0015       Objective: Vitals:   02/20/22 0612 02/20/22 0822 02/20/22 1808 02/20/22 2000  BP: (!) 97/53 (!) 104/55 108/81 (!) 103/49  Pulse: 63 63  65 72  Resp: '15 17  20  '$ Temp: 97.6 F (36.4 C) 98 F (36.7 C) 97.8 F (36.6 C) 97.6 F (36.4 C)  TempSrc: Oral     SpO2: 95% 99% 91% 90%  Weight:      Height:        Intake/Output Summary (Last 24 hours) at 02/21/2022 0800 Last data filed at 02/21/2022 0725 Gross per 24 hour  Intake --  Output 300 ml  Net -300 ml    Filed Weights   02/18/22 1855 02/19/22 0100  Weight: 81 kg 81.6 kg   Exam: General:  Alert, oriented, calm, in no acute distress, resting on 4L  Eyes: EOMI, clear conjuctivae, white sclerea Neck: supple, no masses, trachea mildline  Cardiovascular: RRR, no murmurs or rubs, no peripheral edema  Respiratory: clear to auscultation bilaterally, no wheezes, no crackles  Abdomen: soft, nontender, nondistended, normal bowel tones heard  Skin: dry, no rashes  Musculoskeletal: no joint effusions, normal range of motion  Psychiatric: appropriate affect,  normal speech  Neurologic: extraocular muscles intact, clear speech, moving all extremities with intact sensorium   Data Reviewed: CBC: Recent Labs  Lab 02/18/22 1907 02/19/22 0255 02/20/22 0327 02/21/22 0244  WBC 20.5* 23.0* 22.0* 15.2*  NEUTROABS 16.9*  --  18.7* 11.9*  HGB 15.2 13.2 12.6* 13.0  HCT 49.1 42.1 42.1 41.9  MCV 96.7 97.7 99.5 97.7  PLT 171 143* 152 832   Basic Metabolic Panel: Recent Labs  Lab 02/18/22 1907 02/19/22 0255 02/20/22 0327 02/21/22 0244  NA 144 143 145 146*  K 4.0 4.2 4.0 3.9  CL 102 105 107 107  CO2 32 '30 30 31  '$ GLUCOSE 150* 121* 127* 91  BUN 21 20 29* 27*  CREATININE 1.01 0.99 0.99 0.85  CALCIUM 9.2 8.4* 8.3* 8.2*  MG  --  2.0 2.2 2.3  PHOS  --   --  4.0 2.2*   GFR: Estimated Creatinine Clearance: 63.5 mL/min (by C-G formula based on SCr of 0.85 mg/dL). Liver Function Tests: Recent Labs  Lab 02/18/22 1907 02/19/22 0255 02/20/22 0327 02/21/22 0244  AST '18 15 15 17  '$ ALT '12 11 11 11  '$ ALKPHOS 85 65 66 71  BILITOT 2.0* 1.6* 0.8 1.0  PROT 6.7 5.4* 5.3* 5.3*  ALBUMIN 3.2* 2.6* 2.2* 2.3*   No results for input(s): "LIPASE", "AMYLASE" in the last 168 hours. No results for input(s): "AMMONIA" in the last 168 hours. Coagulation Profile: Recent Labs  Lab 02/19/22 0100  INR 2.2*   Cardiac Enzymes: No results for input(s): "CKTOTAL", "CKMB", "CKMBINDEX", "TROPONINI" in the last 168 hours. BNP (last 3 results) No results for input(s): "PROBNP" in the last 8760 hours. HbA1C: No results for input(s): "HGBA1C" in the last 72 hours. CBG: No results for input(s): "GLUCAP" in the last 168 hours. Lipid Profile: Recent Labs    02/19/22 0100  CHOL 71  HDL 32*  LDLCALC 29  TRIG 52  CHOLHDL 2.2   Thyroid Function Tests: No results for input(s): "TSH", "T4TOTAL", "FREET4", "T3FREE", "THYROIDAB" in the last 72 hours. Anemia Panel: No results for input(s): "VITAMINB12", "FOLATE", "FERRITIN", "TIBC", "IRON", "RETICCTPCT" in the last 72  hours. Urine analysis:    Component Value Date/Time   COLORURINE YELLOW (A) 02/19/2022 0130   APPEARANCEUR CLEAR (A) 02/19/2022 0130   APPEARANCEUR Clear 11/21/2017 1011   LABSPEC >1.046 (H) 02/19/2022 0130   LABSPEC 1.013 09/20/2012 0122   PHURINE 6.0 02/19/2022 0130   GLUCOSEU NEGATIVE 02/19/2022 0130   GLUCOSEU  Negative 09/20/2012 0122   HGBUR NEGATIVE 02/19/2022 0130   BILIRUBINUR NEGATIVE 02/19/2022 0130   BILIRUBINUR Negative 11/21/2017 1011   BILIRUBINUR Negative 09/20/2012 0122   KETONESUR NEGATIVE 02/19/2022 0130   PROTEINUR NEGATIVE 02/19/2022 0130   NITRITE NEGATIVE 02/19/2022 0130   LEUKOCYTESUR NEGATIVE 02/19/2022 0130   LEUKOCYTESUR Negative 09/20/2012 0122   Sepsis Labs: '@LABRCNTIP'$ (procalcitonin:4,lacticidven:4)  ) Recent Results (from the past 240 hour(s))  Resp panel by RT-PCR (RSV, Flu A&B, Covid) Anterior Nasal Swab     Status: Abnormal   Collection Time: 02/18/22  7:08 PM   Specimen: Anterior Nasal Swab  Result Value Ref Range Status   SARS Coronavirus 2 by RT PCR POSITIVE (A) NEGATIVE Final    Comment: (NOTE) SARS-CoV-2 target nucleic acids are DETECTED.  The SARS-CoV-2 RNA is generally detectable in upper respiratory specimens during the acute phase of infection. Positive results are indicative of the presence of the identified virus, but do not rule out bacterial infection or co-infection with other pathogens not detected by the test. Clinical correlation with patient history and other diagnostic information is necessary to determine patient infection status. The expected result is Negative.  Fact Sheet for Patients: EntrepreneurPulse.com.au  Fact Sheet for Healthcare Providers: IncredibleEmployment.be  This test is not yet approved or cleared by the [place] FDA and  has been authorized for detection and/or diagnosis of SARS-CoV-2 by FDA under an Emergency Use Authorization (EUA).  This EUA will remain  in effect (meaning this test can be used) for the duration of  the COVID-19 declaration under Section 564(b)(1) of the A ct, 21 U.S.C. section 360bbb-3(b)(1), unless the authorization is terminated or revoked sooner.     Influenza A by PCR NEGATIVE NEGATIVE Final   Influenza B by PCR NEGATIVE NEGATIVE Final    Comment: (NOTE) The Xpert Xpress SARS-CoV-2/FLU/RSV plus assay is intended as an aid in the diagnosis of influenza from Nasopharyngeal swab specimens and should not be used as a sole basis for treatment. Nasal washings and aspirates are unacceptable for Xpert Xpress SARS-CoV-2/FLU/RSV testing.  Fact Sheet for Patients: EntrepreneurPulse.com.au  Fact Sheet for Healthcare Providers: IncredibleEmployment.be  This test is not yet approved or cleared by the [place] FDA and has been authorized for detection and/or diagnosis of SARS-CoV-2 by FDA under an Emergency Use Authorization (EUA). This EUA will remain in effect (meaning this test can be used) for the duration of the COVID-19 declaration under Section 564(b)(1) of the Act, 21 U.S.C. section 360bbb-3(b)(1), unless the authorization is terminated or revoked.     Resp Syncytial Virus by PCR NEGATIVE NEGATIVE Final    Comment: (NOTE) Fact Sheet for Patients: EntrepreneurPulse.com.au  Fact Sheet for Healthcare Providers: IncredibleEmployment.be  This test is not yet approved or cleared by the [place] FDA and has been authorized for detection and/or diagnosis of SARS-CoV-2 by FDA under an Emergency Use Authorization (EUA). This EUA will remain in effect (meaning this test can be used) for the duration of the COVID-19 declaration under Section 564(b)(1) of the Act, 21 U.S.C. section 360bbb-3(b)(1), unless the authorization is terminated or revoked.  Performed at [facility], [address] [place], [place] [address]   Blood  culture (routine x 2)     Status: None (Preliminary result)   Collection Time: 02/18/22  7:14 PM   Specimen: BLOOD  Result Value Ref Range Status   Specimen Description BLOOD RIGHT ANTECUBITAL  Final   Special Requests   Final    BOTTLES DRAWN AEROBIC AND  ANAEROBIC Blood Culture results may not be optimal due to an excessive volume of blood received in culture bottles   Culture   Final    NO GROWTH 3 DAYS Performed at [facility], [address] [place], [place] [address]    Report Status PENDING  Incomplete  Blood culture (routine x 2)     Status: None (Preliminary result)   Collection Time: 02/18/22  8:42 PM   Specimen: BLOOD  Result Value Ref Range Status   Specimen Description BLOOD RIGHT UPPER ARM  Final   Special Requests   Final    BOTTLES DRAWN AEROBIC AND ANAEROBIC Blood Culture adequate volume   Culture   Final    NO GROWTH 3 DAYS Performed at [facility], [address], [place], [place] [address]    Report Status PENDING  Incomplete  MRSA Next Gen by PCR, Nasal     Status: None   Collection Time: 02/19/22  8:05 AM   Specimen: Nasal Mucosa; Nasal Swab  Result Value Ref Range Status   MRSA by PCR Next Gen NOT DETECTED NOT DETECTED Final    Comment: (NOTE) The GeneXpert MRSA Assay (FDA approved for NASAL specimens only), is one component of a comprehensive MRSA colonization surveillance program. It is not intended to diagnose MRSA infection nor to guide or monitor treatment for MRSA infections. Test performance is not FDA approved in patients less than 45 years old. Performed at [facility], [address] [place], [place] [address]     Studies: No results found.  Scheduled Meds:  apixaban  5 mg Oral BID   vitamin C  500 mg Oral Daily   calcium carbonate  600 mg of elemental calcium Oral Q breakfast   cyanocobalamin  500 mcg Oral Daily   dexamethasone  6 mg Oral Daily   dorzolamide-timolol  1 drop Right Eye BID   ferrous  sulfate  325 mg Oral Q breakfast   Ipratropium-Albuterol  1 puff Inhalation Q6H   latanoprost  1 drop Both Eyes QHS   metoprolol succinate  12.5 mg Oral Daily   midodrine  10 mg Oral TID with meals   pantoprazole  40 mg Oral Daily   potassium chloride SA  60 mEq Oral Daily   pyridostigmine  60 mg Oral TID   rosuvastatin  10 mg Oral QHS   sodium chloride flush  3 mL Intravenous Q12H   tamsulosin  0.4 mg Oral Daily   torsemide  10 mg Oral Daily   zinc sulfate  220 mg Oral Daily   Continuous Infusions:  ampicillin-sulbactam (UNASYN) IV 3 g (02/21/22 0601)   azithromycin 500 mg (02/20/22 2051)    LOS: 3 days   Time spent: 29 minutes  Mollyann Halbert Marry Guan, MD [facility] Pager [phone]  If 7PM-7AM, please contact night-coverage [email or web address] Password [email or web address] 02/21/2022, 8:00 AM

## 2022-02-21 NOTE — Progress Notes (Signed)
   02/21/22 1244  Spiritual Encounters  Type of Visit Initial  Care provided to: Patient  Reason for visit Routine spiritual support  OnCall Visit Yes   Patient ask for a Bible. Bible was provide to Patient advise the Patient Chaplain is available 24/7

## 2022-02-21 NOTE — Progress Notes (Signed)
Palliative:  HPI: 87 y.o. male  with past medical history of dementia, stroke, HTN, HLD, PVD, CAD, CHF, 2L oxygen chronically, OSA,arthritis, asthma, anxiety admitted on 02/18/2022 with shortness of breath related to COVID and multifocal pneumonia.    I met today with Chris Kent. He is sitting up in recliner. No distress. He is alert and he answers appropriately during my visit. He is trying to eat strawberry ice cream but is spitting it all out. Still on oxygen. We did discuss his swallowing issues and poor intake. He confirms that he would NOT want feeding tube but to focus on enjoying his life - whatever time may be left. He has no specific complaints.   I called and spoke with daughter, Chris Kent. Chris Kent shares that she is concerned he is worsening as he has called her very confused. We discussed delirium related to hospitalization, infection, medications. We discussed that stabilizing him and getting him back home and out of the hospital would be ideal. Chris Kent is open to ongoing conversation. Time for outcomes. She understands that his overall prognosis is poor. She does not want him to suffer. He does not appear to be suffering at this current time. We agree to continue to monitor and wait for Chris Kent to declare himself so he can return home (potentially with hospice support) or if decline would need to discuss further with Chris Kent comfort care.   All questions/concerns addressed. Emotional support provided.   Exam: Alert, more confused today. No distress. Sitting up in recliner. Spitting out copious amounts of ice cream. Breathing regular, unlabored. Abd soft.   Plan: - DNR - No feeding tube - Time for outcomes - Potential for home with hospice at time of discharge but will need further discussion  50 min  Vinie Sill, NP Palliative Medicine Team Pager [phone] (Please see [email or web address] for schedule) Team Phone [phone]    Greater than 50%  of this time was spent counseling and coordinating  care related to the above assessment and plan

## 2022-02-21 NOTE — Care Management Important Message (Signed)
Important Message  Patient Details  Name: Chris Kent MRN: [id number] Date of Birth: 03-27-1935   Medicare Important Message Given:  N/A - LOS <3 / Initial given by admissions     [name] A Atasha Colebank 02/21/2022, 7:42 AM

## 2022-02-22 DIAGNOSIS — J9621 Acute and chronic respiratory failure with hypoxia: Secondary | ICD-10-CM | POA: Diagnosis not present

## 2022-02-22 LAB — CBC WITH DIFFERENTIAL/PLATELET
Abs Immature Granulocytes: 0.06 10*3/uL (ref 0.00–0.07)
Basophils Absolute: 0 10*3/uL (ref 0.0–0.1)
Basophils Relative: 0 %
Eosinophils Absolute: 0 10*3/uL (ref 0.0–0.5)
Eosinophils Relative: 0 %
HCT: 44 % (ref 39.0–52.0)
Hemoglobin: 13.9 g/dL (ref 13.0–17.0)
Immature Granulocytes: 1 %
Lymphocytes Relative: 23 %
Lymphs Abs: 3 10*3/uL (ref 0.7–4.0)
MCH: 30 pg (ref 26.0–34.0)
MCHC: 31.6 g/dL (ref 30.0–36.0)
MCV: 94.8 fL (ref 80.0–100.0)
Monocytes Absolute: 1 10*3/uL (ref 0.1–1.0)
Monocytes Relative: 7 %
Neutro Abs: 9.2 10*3/uL — ABNORMAL HIGH (ref 1.7–7.7)
Neutrophils Relative %: 69 %
Platelets: 164 10*3/uL (ref 150–400)
RBC: 4.64 MIL/uL (ref 4.22–5.81)
RDW: 16.1 % — ABNORMAL HIGH (ref 11.5–15.5)
WBC: 13.3 10*3/uL — ABNORMAL HIGH (ref 4.0–10.5)
nRBC: 0 % (ref 0.0–0.2)

## 2022-02-22 LAB — COMPREHENSIVE METABOLIC PANEL
ALT: 24 U/L (ref 0–44)
AST: 39 U/L (ref 15–41)
Albumin: 2.5 g/dL — ABNORMAL LOW (ref 3.5–5.0)
Alkaline Phosphatase: 70 U/L (ref 38–126)
Anion gap: 4 — ABNORMAL LOW (ref 5–15)
BUN: 21 mg/dL (ref 8–23)
CO2: 31 mmol/L (ref 22–32)
Calcium: 8.3 mg/dL — ABNORMAL LOW (ref 8.9–10.3)
Chloride: 110 mmol/L (ref 98–111)
Creatinine, Ser: 0.86 mg/dL (ref 0.61–1.24)
GFR, Estimated: >60 mL/min (ref >60)
Glucose, Bld: 96 mg/dL (ref 70–99)
Potassium: 3.7 mmol/L (ref 3.5–5.1)
Sodium: 145 mmol/L (ref 135–145)
Total Bilirubin: 1 mg/dL (ref 0.3–1.2)
Total Protein: 5.4 g/dL — ABNORMAL LOW (ref 6.5–8.1)

## 2022-02-22 LAB — C-REACTIVE PROTEIN: CRP: 5.6 mg/dL — ABNORMAL HIGH (ref <1.0)

## 2022-02-22 LAB — MAGNESIUM: Magnesium: 2.1 mg/dL (ref 1.7–2.4)

## 2022-02-22 LAB — PHOSPHORUS: Phosphorus: 2 mg/dL — ABNORMAL LOW (ref 2.5–4.6)

## 2022-02-22 MED ORDER — HALOPERIDOL LACTATE 5 MG/ML IJ SOLN
INTRAMUSCULAR | Status: AC
  Administered 2022-02-22: 1 mg via INTRAVENOUS
  Filled 2022-02-22: qty 1

## 2022-02-22 MED ORDER — DIAZEPAM 5 MG/ML IJ SOLN
2.5000 mg | Freq: Once | INTRAMUSCULAR | Status: AC
  Administered 2022-02-22: 2.5 mg via INTRAVENOUS
  Filled 2022-02-22: qty 2

## 2022-02-22 MED ORDER — HALOPERIDOL LACTATE 5 MG/ML IJ SOLN: 1.0000 mg | Freq: Once | INTRAMUSCULAR | Status: AC

## 2022-02-22 MED ORDER — K PHOS MONO-SOD PHOS DI & MONO 155-852-130 MG PO TABS
500.0000 mg | ORAL_TABLET | ORAL | Status: DC
  Administered 2022-02-22: 500 mg via ORAL
  Filled 2022-02-22 (×2): qty 2

## 2022-02-22 NOTE — Progress Notes (Signed)
Physical Therapy Treatment Patient Details Name: Chris Kent MRN: [id number] DOB: [birth year] Today's Date: 02/22/2022   History of Present Illness 87 y/o male presented to ED on 02/18/22 for SOB and weakness. Tested positive for COVID. Admitted for acute respiratory failure with sepsis 2/2 COVID and multifocal pneumonia. PMH: dementia, asthma, CAD, CHF, CVA, HTN, PVD, OSA, glaucoma    PT Comments    Pt received in bed, answering questions appropriately with no true signs of confusion. Pt able to complete transfers with CGA for safety. Small BM on BSC with assist for hygiene. Short distance gait in room without AD and CGA, limited due to lines. Pt left sitting upright in chair with chair alarm on and call bell in reach. No SOB on 4L O2. Productive cough throughout session with mobility. Pt requesting [employer], nursing notified. Continue PT per POC.    Recommendations for follow up therapy are one component of a multi-disciplinary discharge planning process, led by the attending physician.  Recommendations may be updated based on patient status, additional functional criteria and insurance authorization.  Follow Up Recommendations  Home health PT     Assistance Recommended at Discharge Frequent or constant Supervision/Assistance  Patient can return home with the following A little help with walking and/or transfers;A little help with bathing/dressing/bathroom;Assistance with cooking/housework;Direct supervision/assist for medications management;Assist for transportation;Direct supervision/assist for financial management;Help with stairs or ramp for entrance   Equipment Recommendations  Rolling walker (2 wheels);Other (comment) (However pt pt declining use in hospital, check with daughter)    Recommendations for Other Services       Precautions / Restrictions Precautions Precautions: Fall Precaution Comments: COVID+ Restrictions Weight Bearing Restrictions: No      Mobility  Bed Mobility Overal bed mobility: Needs Assistance Bed Mobility: Supine to Sit     Supine to sit: Supervision, HOB elevated     General bed mobility comments: supervision for safety. HOB elevated    Transfers Overall transfer level: Needs assistance Equipment used: None Transfers: Sit to/from Stand, Bed to chair/wheelchair/BSC Sit to Stand: Min guard Stand pivot transfers: Min guard         General transfer comment: able to use momentum to come into standing. Declines use of RW, Assist for hygiene after toileting.    Ambulation/Gait Ambulation/Gait assistance: Min guard Gait Distance (Feet): 10 Feet Assistive device: None Gait Pattern/deviations: Decreased stride length, Step-to pattern, Wide base of support Gait velocity: decreased     General Gait Details: limited due to fatigue   Stairs             Wheelchair Mobility    Modified Rankin (Stroke Patients Only)       Balance Overall balance assessment: Needs assistance Sitting-balance support: No upper extremity supported, Feet supported Sitting balance-Leahy Scale: Fair     Standing balance support: No upper extremity supported, During functional activity Standing balance-Leahy Scale: Fair Standing balance comment:  (Requires SBA for safety due to fall risk)                            Cognition Arousal/Alertness: Awake/alert Behavior During Therapy: WFL for tasks assessed/performed Overall Cognitive Status: History of cognitive impairments - at baseline                                 General Comments: hx of dementia. A&Ox3. Follows commands appropriately.  Exercises General Exercises - Lower Extremity Ankle Circles/Pumps: AROM, Both, 10 reps, Seated Long Arc Quad: AROM, Both, 10 reps, Seated Hip Flexion/Marching: AROM, Both, 10 reps, Seated    General Comments General comments (skin integrity, edema, etc.):  (Pt with productive cough during  mobility. Educated pt on benefits of pulmonary toileting, use of IS, and increasing mobility)      Pertinent Vitals/Pain Pain Assessment Pain Assessment: No/denies pain    Home Living                          Prior Function            PT Goals (current goals can now be found in the care plan section) Acute Rehab PT Goals Patient Stated Goal: to go home Progress towards PT goals: Progressing toward goals    Frequency    Min 2X/week      PT Plan Current plan remains appropriate    Co-evaluation              AM-PAC PT "6 Clicks" Mobility   Outcome Measure  Help needed turning from your back to your side while in a flat bed without using bedrails?: A Little Help needed moving from lying on your back to sitting on the side of a flat bed without using bedrails?: A Little Help needed moving to and from a bed to a chair (including a wheelchair)?: A Little Help needed standing up from a chair using your arms (e.g., wheelchair or bedside chair)?: A Little Help needed to walk in hospital room?: A Little Help needed climbing 3-5 steps with a railing? : A Little 6 Click Score: 18    End of Session Equipment Utilized During Treatment: Oxygen Activity Tolerance: Patient tolerated treatment well Patient left: in chair;with call bell/phone within reach;with chair alarm set Nurse Communication: Mobility status PT Visit Diagnosis: Unsteadiness on feet (R26.81);Muscle weakness (generalized) (M62.81);Other abnormalities of gait and mobility (R26.89)     Time: 9983-3825 PT Time Calculation (min) (ACUTE ONLY): 39 min  Charges:  $Gait Training: 8-22 mins $Therapeutic Exercise: 8-22 mins $Therapeutic Activity: 8-22 mins                     Mikel Cella, PTA   Josie Dixon 02/22/2022, 1:07 PM

## 2022-02-22 NOTE — Progress Notes (Signed)
PROGRESS NOTE  Chris Kent ERX:[id number] DOB: July 27, 1935 DOA: 02/18/2022 PCP: Idelle Crouch, MD  Hospital Course/Subjective:  Chris Kent is a 87 y.o. male with a known history of dementia, anxiety, arthritis, asthma, CHF newly on 2 L of O2 at night and as needed, CAD, GERD, CVA, hypertension, hyperlipidemia, peripheral vascular disease and OSA presents to the emergency department for evaluation of shortness of breath was diagnosed with COVID infection, aspiration PNA. He was started on Remdesivir and IV antibiotics, but Remdesivir discontinued 1/17 due to low positivity, per ID recommendation.  Seen this AM he remains on 4L saturating 100%. He feels well, is sitting up at bedside having breakfast. Daughter is presents, says he was very confused this AM, but he seems back to normal now. Discussed with her that I have very low suspicion for neuro event such as CVA/TIA as he is now up and eating and moving all extremities.   Assessment/Plan:  Principal Problem:   Acute on chronic respiratory failure with hypoxia (HCC) Active Problems:   Sepsis with acute hypoxic respiratory failure without septic shock (HCC)   At high risk for aspiration   COVID-19 virus infection   Chronic diastolic CHF (congestive heart failure) (HCC)   History of orthostatic hypotension   CAD (coronary artery disease)   Pulmonary emboli (HCC)   Hyperlipidemia   Benign prostatic hyperplasia without lower urinary tract symptoms   Assessment and Plan: * Acute on chronic respiratory failure with hypoxia ([place])  Patient presented with worsening oxygen requirement, uses 1 L at baseline, mostly at night and as needed.  Currently on 4L of oxygen which I turned down to 2L and notified RN. History of COPD, so goal O2 sat is >90%. Multifactorial with recent COVID-19 pneumonia with superadded bacterial infection.  Also high risk for aspiration, and concern of aspiration pneumonia. -Continue with supplemental  oxygen-wean to baseline as tolerated   Sepsis with acute hypoxic respiratory failure without septic shock [facility]) Patient met sepsis criteria with leukocytosis, tachycardia and tachypnea.  Mild lactic acidosis which has been resolved.  Most likely secondary to COVID-19 pneumonia with superadded bacterial infection/aspiration pneumonia as patient is high risk for aspiration.  He was started on broad-spectrum antibiotics which include Zosyn and vancomycin.  Procalcitonin at 0.38 and MRSA PCR negative. Patient is also high risk for aspiration based on his history of chronic esophageal dysmotility which is being managed at [facility] and frequent aspirations. Swallow team placed the recommendations as patient would like to continue eating and knows the risk. - currently on Unasyn and Zithromax. - Palliative care has discussed with patient and family on 1/17 - Continue with supportive care   At high risk for aspiration Chronic issue, previous MD discussed with daughter on phone and family is aware that he is high risk for a major aspiration which can result in termination of life. They discussed with palliative care on 1/17. They confirmed DNR status, and that they would like him to continue to eat as this gives him a lot of pleasure. They would like to involve his pulmonologist Dr. Murtis Sink sent to Dr. Lanney Gins by Dr. Reesa Chew -Palliative care consult as above   COVID-19 virus infection Patient was placed on remdesivir due to increased oxygen requirement which can be multifactorial at this time. -discussed with ID Dr. Baxter Flattery on 1/17 who based on very low positivity of PCR test suggests COVID maybe in recovery rather than acute infection. As such, we did DC remdesivir on 1/17. -Decadron for 1 week  -  Continue with supportive care -Continue with supplemental oxygen-wean to baseline as tolerated   Chronic diastolic CHF (congestive heart failure) (HCC) Clinically appears euvolemic with mildly elevated BNP  at 193. Most recent echocardiogram done on 02/21/2021 was within normal limit. -Continue home low-dose torsemide. -Continue home metoprolol    History of orthostatic hypotension Patient was on midodrine and Florinef at home, not sure whether he was taking Florinef or not, so it was not continued.  Patient is also on Decadron at this time. -Continue midodrine   CAD (coronary artery disease) Elevated troponin.  No chest pain.  Troponin peaked at 437 and started trending down.  Most likely secondary to mismatch between demand and supply with acute on chronic respiratory failure with hypoxia, pneumonia and COVID infection. -Limited echocardiogram 1/16 to rule out any wall motion abnormalities, results below   Pulmonary emboli (HCC) CTA chest done yesterday was negative for acute PE. -Continue home Eliquis   Hyperlipidemia -Continue statin   Benign prostatic hyperplasia without lower urinary tract symptoms -Continue home Flomax  DVT Prophylaxis: Eliquis  Code Status: DNR   Family Communication: Discussed with daughter June this AM in his room.  Disposition Plan: Home with HH.   Consultants: Pulmonary   Procedures: None   Antimicrobials: Anti-infectives (From admission, onward)    Start     Dose/Rate Route Frequency Ordered Stop   02/21/22 2000  azithromycin (ZITHROMAX) tablet 500 mg        500 mg Oral Daily 02/21/22 1039 02/23/22 1959   02/19/22 2300  vancomycin (VANCOREADY) IVPB 1500 mg/300 mL  Status:  Discontinued        1,500 mg 150 mL/hr over 120 Minutes Intravenous Every 24 hours 02/18/22 2254 02/19/22 1030   02/19/22 2200  remdesivir 100 mg in sodium chloride 0.9 % 100 mL IVPB  Status:  Discontinued        100 mg 200 mL/hr over 30 Minutes Intravenous Every 24 hours 02/18/22 2228 02/20/22 1239   02/19/22 2200  azithromycin (ZITHROMAX) 500 mg in sodium chloride 0.9 % 250 mL IVPB  Status:  Discontinued        500 mg 250 mL/hr over 60 Minutes Intravenous Every 24 hours  02/19/22 1034 02/21/22 1039   02/19/22 1200  Ampicillin-Sulbactam (UNASYN) 3 g in sodium chloride 0.9 % 100 mL IVPB        3 g 200 mL/hr over 30 Minutes Intravenous Every 6 hours 02/19/22 1030     02/18/22 2300  piperacillin-tazobactam (ZOSYN) IVPB 3.375 g  Status:  Discontinued        3.375 g 12.5 mL/hr over 240 Minutes Intravenous Every 8 hours 02/18/22 2254 02/19/22 1030   02/18/22 2300  vancomycin (VANCOREADY) IVPB 1750 mg/350 mL        1,750 mg 175 mL/hr over 120 Minutes Intravenous  Once 02/18/22 2254 02/19/22 0703   02/18/22 2257  remdesivir 100 mg in sodium chloride 0.9 % 100 mL IVPB       See Hyperspace for full Linked Orders Report.   100 mg 200 mL/hr over 30 Minutes Intravenous  Once 02/18/22 2228 02/19/22 0452   02/18/22 2230  remdesivir 100 mg in sodium chloride 0.9 % 100 mL IVPB       See Hyperspace for full Linked Orders Report.   100 mg 200 mL/hr over 30 Minutes Intravenous  Once 02/18/22 2228 02/19/22 0327   02/18/22 2130  cefTRIAXone (ROCEPHIN) 1 g in sodium chloride 0.9 % 100 mL IVPB  1 g 200 mL/hr over 30 Minutes Intravenous  Once 02/18/22 2119 02/18/22 2255   02/18/22 2130  azithromycin (ZITHROMAX) 500 mg in sodium chloride 0.9 % 250 mL IVPB        500 mg 250 mL/hr over 60 Minutes Intravenous  Once 02/18/22 2119 02/19/22 0015       Objective: Vitals:   02/21/22 1750 02/21/22 1750 02/21/22 2114 02/22/22 0546  BP:  110/63 136/70 (!) 140/74  Pulse:  (!) 102 72 74  Resp:  '16 20 18  '$ Temp: 98.5 F (36.9 C)  (!) 97.5 F (36.4 C) 97.7 F (36.5 C)  TempSrc: Oral  Oral Oral  SpO2:  100% 97% 98%  Weight:      Height:        Intake/Output Summary (Last 24 hours) at 02/22/2022 0847 Last data filed at 02/22/2022 8527 Gross per 24 hour  Intake 580 ml  Output 1050 ml  Net -470 ml    Filed Weights   02/18/22 1855 02/19/22 0100  Weight: 81 kg 81.6 kg   Exam: General:  Alert, oriented, calm, in no acute distress, sitting on edge of bed having breakfast   Eyes: EOMI, clear conjuctivae, white sclerea Neck: supple, no masses, trachea mildline  Cardiovascular: RRR, no murmurs or rubs, no peripheral edema  Respiratory: clear to auscultation bilaterally, no wheezes, no crackles  Abdomen: soft, nontender, nondistended, normal bowel tones heard  Skin: dry, no rashes  Musculoskeletal: no joint effusions, normal range of motion  Psychiatric: appropriate affect, normal speech  Neurologic: extraocular muscles intact, clear speech, moving all extremities with intact sensorium    Data Reviewed: CBC: Recent Labs  Lab 02/18/22 1907 02/19/22 0255 02/20/22 0327 02/21/22 0244 02/22/22 0543  WBC 20.5* 23.0* 22.0* 15.2* 13.3*  NEUTROABS 16.9*  --  18.7* 11.9* 9.2*  HGB 15.2 13.2 12.6* 13.0 13.9  HCT 49.1 42.1 42.1 41.9 44.0  MCV 96.7 97.7 99.5 97.7 94.8  PLT 171 143* 152 160 782   Basic Metabolic Panel: Recent Labs  Lab 02/18/22 1907 02/19/22 0255 02/20/22 0327 02/21/22 0244 02/22/22 0543  NA 144 143 145 146* 145  K 4.0 4.2 4.0 3.9 3.7  CL 102 105 107 107 110  CO2 32 '30 30 31 31  '$ GLUCOSE 150* 121* 127* 91 96  BUN 21 20 29* 27* 21  CREATININE 1.01 0.99 0.99 0.85 0.86  CALCIUM 9.2 8.4* 8.3* 8.2* 8.3*  MG  --  2.0 2.2 2.3 2.1  PHOS  --   --  4.0 2.2* 2.0*   GFR: Estimated Creatinine Clearance: 62.8 mL/min (by C-G formula based on SCr of 0.86 mg/dL). Liver Function Tests: Recent Labs  Lab 02/18/22 1907 02/19/22 0255 02/20/22 0327 02/21/22 0244 02/22/22 0543  AST '18 15 15 17 '$ 39  ALT '12 11 11 11 24  '$ ALKPHOS 85 65 66 71 70  BILITOT 2.0* 1.6* 0.8 1.0 1.0  PROT 6.7 5.4* 5.3* 5.3* 5.4*  ALBUMIN 3.2* 2.6* 2.2* 2.3* 2.5*   No results for input(s): "LIPASE", "AMYLASE" in the last 168 hours. No results for input(s): "AMMONIA" in the last 168 hours. Coagulation Profile: Recent Labs  Lab 02/19/22 0100  INR 2.2*   Cardiac Enzymes: No results for input(s): "CKTOTAL", "CKMB", "CKMBINDEX", "TROPONINI" in the last 168 hours. BNP  (last 3 results) No results for input(s): "PROBNP" in the last 8760 hours. HbA1C: No results for input(s): "HGBA1C" in the last 72 hours. CBG: No results for input(s): "GLUCAP" in the last 168 hours. Lipid Profile:  No results for input(s): "CHOL", "HDL", "LDLCALC", "TRIG", "CHOLHDL", "LDLDIRECT" in the last 72 hours. Thyroid Function Tests: No results for input(s): "TSH", "T4TOTAL", "FREET4", "T3FREE", "THYROIDAB" in the last 72 hours. Anemia Panel: No results for input(s): "VITAMINB12", "FOLATE", "FERRITIN", "TIBC", "IRON", "RETICCTPCT" in the last 72 hours. Urine analysis:    Component Value Date/Time   COLORURINE YELLOW (A) 02/19/2022 0130   APPEARANCEUR CLEAR (A) 02/19/2022 0130   APPEARANCEUR Clear 11/21/2017 1011   LABSPEC >1.046 (H) 02/19/2022 0130   LABSPEC 1.013 09/20/2012 0122   PHURINE 6.0 02/19/2022 0130   GLUCOSEU NEGATIVE 02/19/2022 0130   GLUCOSEU Negative 09/20/2012 0122   HGBUR NEGATIVE 02/19/2022 0130   BILIRUBINUR NEGATIVE 02/19/2022 0130   BILIRUBINUR Negative 11/21/2017 1011   BILIRUBINUR Negative 09/20/2012 0122   KETONESUR NEGATIVE 02/19/2022 0130   PROTEINUR NEGATIVE 02/19/2022 0130   NITRITE NEGATIVE 02/19/2022 0130   LEUKOCYTESUR NEGATIVE 02/19/2022 0130   LEUKOCYTESUR Negative 09/20/2012 0122   Sepsis Labs: '@LABRCNTIP'$ (procalcitonin:4,lacticidven:4)  ) Recent Results (from the past 240 hour(s))  Resp panel by RT-PCR (RSV, Flu A&B, Covid) Anterior Nasal Swab     Status: Abnormal   Collection Time: 02/18/22  7:08 PM   Specimen: Anterior Nasal Swab  Result Value Ref Range Status   SARS Coronavirus 2 by RT PCR POSITIVE (A) NEGATIVE Final    Comment: (NOTE) SARS-CoV-2 target nucleic acids are DETECTED.  The SARS-CoV-2 RNA is generally detectable in upper respiratory specimens during the acute phase of infection. Positive results are indicative of the presence of the identified virus, but do not rule out bacterial infection or co-infection with  other pathogens not detected by the test. Clinical correlation with patient history and other diagnostic information is necessary to determine patient infection status. The expected result is Negative.  Fact Sheet for Patients: EntrepreneurPulse.com.au  Fact Sheet for Healthcare Providers: IncredibleEmployment.be  This test is not yet approved or cleared by the [place] FDA and  has been authorized for detection and/or diagnosis of SARS-CoV-2 by FDA under an Emergency Use Authorization (EUA).  This EUA will remain in effect (meaning this test can be used) for the duration of  the COVID-19 declaration under Section 564(b)(1) of the A ct, 21 U.S.C. section 360bbb-3(b)(1), unless the authorization is terminated or revoked sooner.     Influenza A by PCR NEGATIVE NEGATIVE Final   Influenza B by PCR NEGATIVE NEGATIVE Final    Comment: (NOTE) The Xpert Xpress SARS-CoV-2/FLU/RSV plus assay is intended as an aid in the diagnosis of influenza from Nasopharyngeal swab specimens and should not be used as a sole basis for treatment. Nasal washings and aspirates are unacceptable for Xpert Xpress SARS-CoV-2/FLU/RSV testing.  Fact Sheet for Patients: EntrepreneurPulse.com.au  Fact Sheet for Healthcare Providers: IncredibleEmployment.be  This test is not yet approved or cleared by the [place] FDA and has been authorized for detection and/or diagnosis of SARS-CoV-2 by FDA under an Emergency Use Authorization (EUA). This EUA will remain in effect (meaning this test can be used) for the duration of the COVID-19 declaration under Section 564(b)(1) of the Act, 21 U.S.C. section 360bbb-3(b)(1), unless the authorization is terminated or revoked.     Resp Syncytial Virus by PCR NEGATIVE NEGATIVE Final    Comment: (NOTE) Fact Sheet for Patients: EntrepreneurPulse.com.au  Fact Sheet for Healthcare  Providers: IncredibleEmployment.be  This test is not yet approved or cleared by the [place] FDA and has been authorized for detection and/or diagnosis of SARS-CoV-2 by FDA under an Emergency Use Authorization (EUA).  This EUA will remain in effect (meaning this test can be used) for the duration of the COVID-19 declaration under Section 564(b)(1) of the Act, 21 U.S.C. section 360bbb-3(b)(1), unless the authorization is terminated or revoked.  Performed at [facility], [address] [place], [place] [address]   Blood culture (routine x 2)     Status: None (Preliminary result)   Collection Time: 02/18/22  7:14 PM   Specimen: BLOOD  Result Value Ref Range Status   Specimen Description BLOOD RIGHT ANTECUBITAL  Final   Special Requests   Final    BOTTLES DRAWN AEROBIC AND ANAEROBIC Blood Culture results may not be optimal due to an excessive volume of blood received in culture bottles   Culture   Final    NO GROWTH 4 DAYS Performed at [facility], [address], [place], [place] [address]    Report Status PENDING  Incomplete  Blood culture (routine x 2)     Status: None (Preliminary result)   Collection Time: 02/18/22  8:42 PM   Specimen: BLOOD  Result Value Ref Range Status   Specimen Description BLOOD RIGHT UPPER ARM  Final   Special Requests   Final    BOTTLES DRAWN AEROBIC AND ANAEROBIC Blood Culture adequate volume   Culture   Final    NO GROWTH 4 DAYS Performed at [facility], [address], [place], [place] [address]    Report Status PENDING  Incomplete  MRSA Next Gen by PCR, Nasal     Status: None   Collection Time: 02/19/22  8:05 AM   Specimen: Nasal Mucosa; Nasal Swab  Result Value Ref Range Status   MRSA by PCR Next Gen NOT DETECTED NOT DETECTED Final    Comment: (NOTE) The GeneXpert MRSA Assay (FDA approved for NASAL specimens only), is one component of a comprehensive MRSA colonization  surveillance program. It is not intended to diagnose MRSA infection nor to guide or monitor treatment for MRSA infections. Test performance is not FDA approved in patients less than 44 years old. Performed at [facility], [address] [place], [place] [address]     Studies: No results found.  Scheduled Meds:  apixaban  5 mg Oral BID   vitamin C  500 mg Oral Daily   azithromycin  500 mg Oral Daily   calcium carbonate  600 mg of elemental calcium Oral Q breakfast   cyanocobalamin  500 mcg Oral Daily   dexamethasone  6 mg Oral Daily   dorzolamide-timolol  1 drop Right Eye BID   ferrous sulfate  325 mg Oral Q breakfast   Ipratropium-Albuterol  1 puff Inhalation Q6H   latanoprost  1 drop Both Eyes QHS   metoprolol succinate  12.5 mg Oral Daily   midodrine  10 mg Oral TID with meals   pantoprazole  40 mg Oral Daily   potassium chloride SA  60 mEq Oral Daily   pyridostigmine  60 mg Oral TID   rosuvastatin  10 mg Oral QHS   sodium chloride flush  3 mL Intravenous Q12H   tamsulosin  0.4 mg Oral Daily   torsemide  10 mg Oral Daily   zinc sulfate  220 mg Oral Daily   Continuous Infusions:  ampicillin-sulbactam (UNASYN) IV 3 g (02/22/22 0627)    LOS: 4 days   Time spent: 26 minutes  Bentlee Drier Marry Guan, MD [facility] Pager [phone]  If 7PM-7AM, please contact night-coverage [email or web address] Password [facility] 02/22/2022, 8:47 AM

## 2022-02-23 ENCOUNTER — Emergency Department: Payer: PPO

## 2022-02-23 ENCOUNTER — Inpatient Hospital Stay (HOSPITAL_COMMUNITY)
Admission: EM | Admit: 2022-02-23 | Discharge: 2022-02-24 | Disposition: A | Discharge status: Hospice | Payer: PPO | Source: Home / Self Care | Attending: Internal Medicine | Admitting: Internal Medicine

## 2022-02-23 DIAGNOSIS — K224 Dyskinesia of esophagus: Secondary | ICD-10-CM | POA: Diagnosis present

## 2022-02-23 DIAGNOSIS — J45909 Unspecified asthma, uncomplicated: Secondary | ICD-10-CM | POA: Diagnosis present

## 2022-02-23 DIAGNOSIS — I739 Peripheral vascular disease, unspecified: Secondary | ICD-10-CM | POA: Diagnosis present

## 2022-02-23 DIAGNOSIS — R54 Age-related physical debility: Secondary | ICD-10-CM | POA: Diagnosis present

## 2022-02-23 DIAGNOSIS — I509 Heart failure, unspecified: Secondary | ICD-10-CM | POA: Diagnosis present

## 2022-02-23 DIAGNOSIS — G4733 Obstructive sleep apnea (adult) (pediatric): Secondary | ICD-10-CM | POA: Diagnosis present

## 2022-02-23 DIAGNOSIS — K219 Gastro-esophageal reflux disease without esophagitis: Secondary | ICD-10-CM | POA: Diagnosis present

## 2022-02-23 DIAGNOSIS — I2489 Other forms of acute ischemic heart disease: Secondary | ICD-10-CM | POA: Diagnosis present

## 2022-02-23 DIAGNOSIS — Z8042 Family history of malignant neoplasm of prostate: Secondary | ICD-10-CM

## 2022-02-23 DIAGNOSIS — H409 Unspecified glaucoma: Secondary | ICD-10-CM | POA: Diagnosis present

## 2022-02-23 DIAGNOSIS — U071 COVID-19: Secondary | ICD-10-CM | POA: Diagnosis present

## 2022-02-23 DIAGNOSIS — J9601 Acute respiratory failure with hypoxia: Secondary | ICD-10-CM | POA: Diagnosis present

## 2022-02-23 DIAGNOSIS — E785 Hyperlipidemia, unspecified: Secondary | ICD-10-CM | POA: Diagnosis present

## 2022-02-23 DIAGNOSIS — Z7901 Long term (current) use of anticoagulants: Secondary | ICD-10-CM

## 2022-02-23 DIAGNOSIS — R0902 Hypoxemia: Principal | ICD-10-CM

## 2022-02-23 DIAGNOSIS — Z79899 Other long term (current) drug therapy: Secondary | ICD-10-CM

## 2022-02-23 DIAGNOSIS — M1990 Unspecified osteoarthritis, unspecified site: Secondary | ICD-10-CM | POA: Diagnosis present

## 2022-02-23 DIAGNOSIS — Z515 Encounter for palliative care: Secondary | ICD-10-CM

## 2022-02-23 DIAGNOSIS — Z9981 Dependence on supplemental oxygen: Secondary | ICD-10-CM

## 2022-02-23 DIAGNOSIS — Z86711 Personal history of pulmonary embolism: Secondary | ICD-10-CM

## 2022-02-23 DIAGNOSIS — J690 Pneumonitis due to inhalation of food and vomit: Secondary | ICD-10-CM | POA: Diagnosis present

## 2022-02-23 DIAGNOSIS — I110 Hypertensive heart disease with heart failure: Secondary | ICD-10-CM | POA: Diagnosis present

## 2022-02-23 DIAGNOSIS — Z66 Do not resuscitate: Secondary | ICD-10-CM | POA: Diagnosis present

## 2022-02-23 DIAGNOSIS — K222 Esophageal obstruction: Secondary | ICD-10-CM | POA: Diagnosis present

## 2022-02-23 DIAGNOSIS — N400 Enlarged prostate without lower urinary tract symptoms: Secondary | ICD-10-CM | POA: Diagnosis present

## 2022-02-23 DIAGNOSIS — R7989 Other specified abnormal findings of blood chemistry: Secondary | ICD-10-CM | POA: Insufficient documentation

## 2022-02-23 DIAGNOSIS — I2510 Atherosclerotic heart disease of native coronary artery without angina pectoris: Secondary | ICD-10-CM | POA: Diagnosis present

## 2022-02-23 DIAGNOSIS — J9621 Acute and chronic respiratory failure with hypoxia: Secondary | ICD-10-CM | POA: Diagnosis not present

## 2022-02-23 DIAGNOSIS — F0394 Unspecified dementia, unspecified severity, with anxiety: Secondary | ICD-10-CM | POA: Diagnosis present

## 2022-02-23 DIAGNOSIS — Z8673 Personal history of transient ischemic attack (TIA), and cerebral infarction without residual deficits: Secondary | ICD-10-CM

## 2022-02-23 DIAGNOSIS — F419 Anxiety disorder, unspecified: Secondary | ICD-10-CM | POA: Diagnosis present

## 2022-02-23 DIAGNOSIS — I10 Essential (primary) hypertension: Secondary | ICD-10-CM | POA: Diagnosis present

## 2022-02-23 LAB — CBC WITH DIFFERENTIAL/PLATELET
Abs Immature Granulocytes: 0.06 10*3/uL (ref 0.00–0.07)
Abs Immature Granulocytes: 0.11 10*3/uL — ABNORMAL HIGH (ref 0.00–0.07)
Basophils Absolute: 0 10*3/uL (ref 0.0–0.1)
Basophils Absolute: 0 10*3/uL (ref 0.0–0.1)
Basophils Relative: 0 %
Basophils Relative: 0 %
Eosinophils Absolute: 0 10*3/uL (ref 0.0–0.5)
Eosinophils Absolute: 0 10*3/uL (ref 0.0–0.5)
Eosinophils Relative: 0 %
Eosinophils Relative: 0 %
HCT: 42 % (ref 39.0–52.0)
HCT: 47.7 % (ref 39.0–52.0)
Hemoglobin: 12.9 g/dL — ABNORMAL LOW (ref 13.0–17.0)
Hemoglobin: 14.8 g/dL (ref 13.0–17.0)
Immature Granulocytes: 1 %
Immature Granulocytes: 1 %
Lymphocytes Relative: 16 %
Lymphocytes Relative: 7 %
Lymphs Abs: 2 10*3/uL (ref 0.7–4.0)
Lymphs Abs: 1.5 10*3/uL (ref 0.7–4.0)
MCH: 29.6 pg (ref 26.0–34.0)
MCH: 30.2 pg (ref 26.0–34.0)
MCHC: 30.7 g/dL (ref 30.0–36.0)
MCHC: 31 g/dL (ref 30.0–36.0)
MCV: 96.3 fL (ref 80.0–100.0)
MCV: 97.3 fL (ref 80.0–100.0)
Monocytes Absolute: 1.1 10*3/uL — ABNORMAL HIGH (ref 0.1–1.0)
Monocytes Absolute: 1.2 10*3/uL — ABNORMAL HIGH (ref 0.1–1.0)
Monocytes Relative: 9 %
Monocytes Relative: 6 %
Neutro Abs: 9.4 10*3/uL — ABNORMAL HIGH (ref 1.7–7.7)
Neutro Abs: 16.7 10*3/uL — ABNORMAL HIGH (ref 1.7–7.7)
Neutrophils Relative %: 74 %
Neutrophils Relative %: 86 %
Platelets: 151 10*3/uL (ref 150–400)
Platelets: 169 10*3/uL (ref 150–400)
RBC: 4.36 MIL/uL (ref 4.22–5.81)
RBC: 4.9 MIL/uL (ref 4.22–5.81)
RDW: 15.9 % — ABNORMAL HIGH (ref 11.5–15.5)
RDW: 16.2 % — ABNORMAL HIGH (ref 11.5–15.5)
WBC: 12.5 10*3/uL — ABNORMAL HIGH (ref 4.0–10.5)
WBC: 19.5 10*3/uL — ABNORMAL HIGH (ref 4.0–10.5)
nRBC: 0 % (ref 0.0–0.2)
nRBC: 0 % (ref 0.0–0.2)

## 2022-02-23 LAB — COMPREHENSIVE METABOLIC PANEL
ALT: 23 U/L (ref 0–44)
AST: 24 U/L (ref 15–41)
Albumin: 2.3 g/dL — ABNORMAL LOW (ref 3.5–5.0)
Alkaline Phosphatase: 63 U/L (ref 38–126)
Anion gap: 7 (ref 5–15)
BUN: 15 mg/dL (ref 8–23)
CO2: 33 mmol/L — ABNORMAL HIGH (ref 22–32)
Calcium: 8.3 mg/dL — ABNORMAL LOW (ref 8.9–10.3)
Chloride: 111 mmol/L (ref 98–111)
Creatinine, Ser: 0.85 mg/dL (ref 0.61–1.24)
GFR, Estimated: >60 mL/min (ref >60)
Glucose, Bld: 102 mg/dL — ABNORMAL HIGH (ref 70–99)
Potassium: 3.6 mmol/L (ref 3.5–5.1)
Sodium: 151 mmol/L — ABNORMAL HIGH (ref 135–145)
Total Bilirubin: 0.8 mg/dL (ref 0.3–1.2)
Total Protein: 5.1 g/dL — ABNORMAL LOW (ref 6.5–8.1)

## 2022-02-23 LAB — TROPONIN I (HIGH SENSITIVITY): Troponin I (High Sensitivity): 153 ng/L (ref <18)

## 2022-02-23 LAB — BASIC METABOLIC PANEL
Anion gap: 7 (ref 5–15)
BUN: 15 mg/dL (ref 8–23)
CO2: 32 mmol/L (ref 22–32)
Calcium: 8.5 mg/dL — ABNORMAL LOW (ref 8.9–10.3)
Chloride: 107 mmol/L (ref 98–111)
Creatinine, Ser: 0.87 mg/dL (ref 0.61–1.24)
GFR, Estimated: >60 mL/min (ref >60)
Glucose, Bld: 170 mg/dL — ABNORMAL HIGH (ref 70–99)
Potassium: 4.5 mmol/L (ref 3.5–5.1)
Sodium: 146 mmol/L — ABNORMAL HIGH (ref 135–145)

## 2022-02-23 LAB — PHOSPHORUS: Phosphorus: 3.6 mg/dL (ref 2.5–4.6)

## 2022-02-23 LAB — CULTURE, BLOOD (ROUTINE X 2)
Culture: NO GROWTH
Culture: NO GROWTH
Special Requests: ADEQUATE

## 2022-02-23 LAB — MAGNESIUM: Magnesium: 2.1 mg/dL (ref 1.7–2.4)

## 2022-02-23 LAB — C-REACTIVE PROTEIN: CRP: 3.7 mg/dL — ABNORMAL HIGH (ref <1.0)

## 2022-02-23 MED ORDER — DEXAMETHASONE 6 MG PO TABS: 6.0000 mg | ORAL_TABLET | Freq: Every day | ORAL | 0 refills | Status: DC

## 2022-02-23 MED ORDER — AMOXICILLIN-POT CLAVULANATE 500-125 MG PO TABS: 1.0000 | ORAL_TABLET | Freq: Three times a day (TID) | ORAL | 0 refills | Status: DC

## 2022-02-23 MED ORDER — VANCOMYCIN HCL IN DEXTROSE 1-5 GM/200ML-% IV SOLN
1000.0000 mg | Freq: Once | INTRAVENOUS | Status: AC
  Administered 2022-02-24: 1000 mg via INTRAVENOUS
  Filled 2022-02-23: qty 200

## 2022-02-23 MED ORDER — DIAZEPAM 5 MG/ML IJ SOLN
2.5000 mg | Freq: Once | INTRAMUSCULAR | Status: AC
  Administered 2022-02-23: 2.5 mg via INTRAVENOUS
  Filled 2022-02-23: qty 2

## 2022-02-23 MED ORDER — SODIUM CHLORIDE 0.9 % IV SOLN
2.0000 g | Freq: Once | INTRAVENOUS | Status: AC
  Administered 2022-02-24: 2 g via INTRAVENOUS
  Filled 2022-02-23: qty 12.5

## 2022-02-23 MED ORDER — ZINC SULFATE 220 (50 ZN) MG PO CAPS: 220.0000 mg | ORAL_CAPSULE | Freq: Every day | ORAL | 0 refills | Status: DC

## 2022-02-23 NOTE — Progress Notes (Signed)
@  2215 Patient noted to be extremely agitated, attempting to get out of bed, aggressive, hitting, kicking and spitting at staff. Family called to try to soothe him, same unsuccessful, provider made aware, orders made for Haldol and tele monitoring. Orders commenced. '@2330'$  No change noted,  Pt remained agitated, security x2 called and at bedside. NP made aware of same, Orders made and commenced.

## 2022-02-23 NOTE — ED Provider Notes (Signed)
[facility] Provider Note    Event Date/Time   First MD Initiated Contact with Patient 02/23/22 2214     (approximate)   History   Shortness of Breath   HPI  NHAT HEARNE is a 87 y.o. male who presents to the emergency department today from home because of concerns for shortness of breath.  Patient was discharged from the hospital earlier today.  He had an admission for pneumonia.  Per discharge he was satting okay on room air at time of discharge however per EMS report he was sent home on 3 L oxygen.  However when EMS arrived patient was hypoxic on his 3 L.  He was placed on a nonrebreather.  Patient states he continued to feel bad even at time of discharge and while being home.  Furthermore he says that he does aspirate anytime he tries to eat anything.  He says that since being placed on the nonrebreather he does feel somewhat less short of breath.     Physical Exam   Triage Vital Signs: ED Triage Vitals  Enc Vitals Group     BP 02/23/22 2217 125/64     Pulse Rate 02/23/22 2217 81     Resp 02/23/22 2217 (!) 24     Temp --      Temp src --      SpO2 02/23/22 2216 94 %     Weight --      Height --      Head Circumference --      Peak Flow --      Pain Score --      Pain Loc --      Pain Edu? --      Excl. in [place]? --     Most recent vital signs: Vitals:   02/23/22 2216 02/23/22 2217  BP:  125/64  Pulse:  81  Resp:  (!) 24  SpO2: 94% 95%   General: Awake, alert, oriented CV:  Good peripheral perfusion.  Resp:  Increased work of breathing. Rhonchi. Abd:  No distention.    ED Results / Procedures / Treatments   Labs (all labs ordered are listed, but only abnormal results are displayed) Labs Reviewed  CBC WITH DIFFERENTIAL/PLATELET - Abnormal; Notable for the following components:      Result Value   WBC 19.5 (*)    RDW 16.2 (*)    Neutro Abs 16.7 (*)    Monocytes Absolute 1.2 (*)    Abs Immature Granulocytes 0.11 (*)    All  other components within normal limits  BASIC METABOLIC PANEL - Abnormal; Notable for the following components:   Sodium 146 (*)    Glucose, Bld 170 (*)    Calcium 8.5 (*)    All other components within normal limits  TROPONIN I (HIGH SENSITIVITY) - Abnormal; Notable for the following components:   Troponin I (High Sensitivity) 153 (*)    All other components within normal limits  CULTURE, BLOOD (ROUTINE X 2)  CULTURE, BLOOD (ROUTINE X 2)    RADIOLOGY I independently interpreted and visualized the CXR. My interpretation: right airspace disease Radiology interpretation:  IMPRESSION:  1. Mild, stable left basilar atelectasis and/or infiltrate.  2. Very small, stable bilateral pleural effusions.      PROCEDURES:  Critical Care performed: No  Procedures   MEDICATIONS ORDERED IN ED: Medications  vancomycin (VANCOCIN) IVPB 1000 mg/200 mL premix (has no administration in time range)  ceFEPIme (MAXIPIME) 2 g in  sodium chloride 0.9 % 100 mL IVPB (has no administration in time range)     IMPRESSION / MDM / ASSESSMENT AND PLAN / ED COURSE  I reviewed the triage vital signs and the nursing notes.                              Differential diagnosis includes, but is not limited to, aspiration, pneumonia, COPD, ptx.  Patient's presentation is most consistent with acute presentation with potential threat to life or bodily function.  The patient is on the cardiac monitor to evaluate for evidence of arrhythmia and/or significant heart rate changes.  Patient presented to the emergency department today because of concerns for shortness of breath.  Patient was discharged from the hospital earlier today.  Patient was hypoxic on 3 L and so was put on nonrebreather and stated that he did feel improvement.  Chest area continues to be concerning for possible infiltrate.  Patient's white blood cell has increased since time of discharge.  Do have concern for worsening infection.  Patient will be  started on broad-spectrum antibiotics.  Discussed with Dr. Damita Dunnings with hospital service who will plan on admission.     FINAL CLINICAL IMPRESSION(S) / ED DIAGNOSES   Final diagnoses:  Hypoxia      Note:  This document was prepared using Dragon voice recognition software and may include unintentional dictation errors.    Nance Pear, MD 02/24/22 [phone]

## 2022-02-23 NOTE — Discharge Summary (Addendum)
Discharge Summary  Chris Kent YIF:[id number] DOB: [birth year]  PCP: Idelle Crouch, MD  Admit date: 02/18/2022 Discharge date: 02/23/2022  Recommendations for Outpatient Follow-up:  Please follow up with your PCP with CBC and BMP in 1-2 weeks Keep in touch with home hospice liaison.  Discharge Diagnoses:  Active Hospital Problems   Diagnosis Date Noted   Acute on chronic respiratory failure with hypoxia ([place]) 02/18/2022    Priority: 1.   Sepsis with acute hypoxic respiratory failure without septic shock ([place]) 02/18/2022    Priority: 2.   At high risk for aspiration 07/16/2021    Priority: 3.   COVID-19 virus infection 02/18/2022    Priority: 4.   Chronic diastolic CHF (congestive heart failure) ([place])     Priority: 5.   History of orthostatic hypotension 02/19/2022    Priority: 6.   CAD (coronary artery disease) 02/20/2021    Priority: 7.   Pulmonary emboli ([place]) 02/26/2021    Priority: 8.   Hyperlipidemia 03/05/2016    Priority: 9.   Benign prostatic hyperplasia without lower urinary tract symptoms 03/18/2012    Priority: 10.    Resolved Hospital Problems  No resolved problems to display.   Discharge Condition: Stable, plan is home with hospice.   Diet recommendation: Diet Orders (From admission, onward)     Start     Ordered   02/19/22 1157  DIET DYS 3 Room service appropriate? Yes with Assist; Fluid consistency: Thin  Diet effective now       Comments: Please send meats MINCED w/ gravies.  Soups.  NO STRAWS!!!  Question Answer Comment  Room service appropriate? Yes with Assist   Fluid consistency: Thin      02/19/22 1158           HPI and Brief Hospital Course:   Chris Kent is a 87 y.o. male with a known history of dementia, anxiety, arthritis, asthma, CHF newly on 2 L of O2 at night and as needed, CAD, GERD, CVA, hypertension, hyperlipidemia, peripheral vascular disease and OSA presents to the emergency department for evaluation of shortness of  breath was diagnosed with COVID infection, aspiration PNA. He was started on Remdesivir and IV antibiotics, but Remdesivir discontinued 1/17 due to low positivity, per ID recommendation. He completed Azithromycin in the hospital and was treated with UnaSyn as well, will continue Augmentin at home to complete total one week course. He was seen by Speech and Palliative care; he has known severe dysphagia, he regurgitates food and is a very high aspiration risk. He and his daughter know this and want him to continue to eat for comfort. He has been weaned to room air and is ready for DC home with hospice following today   He has had some intermittent confusion overnight due to delirium. Plan of care was discussed in detail with his daughter June the last couple of days and again this AM by phone and she is agreeable to DC home with hospice following today.  Discharge Exam: BP 139/82 (BP Location: Left Arm)   Pulse 65   Temp 98.1 F (36.7 C) (Oral)   Resp 20   Ht '5\' 9"'$  (1.753 m)   Wt 81.6 kg   SpO2 90% Comment: MD in room, OK to remove [place] when at or above O2 goal of 90%  BMI 26.57 kg/m  General:  Alert, oriented, calm, in no acute distress, on room air Eyes: EOMI, clear sclerea Neck: supple, no masses, trachea mildline  Cardiovascular: RRR,  no murmurs or rubs, no peripheral edema  Respiratory: bilateral diffuse rhonchi, no respiratory distress, no stridor or wheezing  Abdomen: soft, nontender, nondistended, normal bowel tones heard  Skin: dry, no rashes  Musculoskeletal: no joint effusions, normal range of motion  Psychiatric: appropriate affect, normal speech  Neurologic: extraocular muscles intact, clear speech, moving all extremities with intact sensorium   Discharge Instructions You were cared for by a hospitalist during your hospital stay. If you have any questions about your discharge medications or the care you received while you were in the hospital after you are discharged, you can  call the unit and asked to speak with the hospitalist on call if the hospitalist that took care of you is not available. Once you are discharged, your primary care physician will handle any further medical issues. Please note that NO REFILLS for any discharge medications will be authorized once you are discharged, as it is imperative that you return to your primary care physician (or establish a relationship with a primary care physician if you do not have one) for your aftercare needs so that they can reassess your need for medications and monitor your lab values.   Allergies as of 02/23/2022   No Known Allergies      Medication List     STOP taking these medications    fludrocortisone 0.1 MG tablet Commonly known as: FLORINEF       TAKE these medications    amoxicillin-clavulanate 500-125 MG tablet Commonly known as: Augmentin Take 1 tablet by mouth 3 (three) times daily for 4 days.   apixaban 5 MG Tabs tablet Commonly known as: ELIQUIS Take two tablets twice a day for six days then one tablet twice a day afterwards What changed:  how much to take how to take this when to take this additional instructions   calcium carbonate 1500 (600 Ca) MG Tabs tablet Commonly known as: OSCAL Take 600 mg of elemental calcium by mouth daily with breakfast.   dexamethasone 6 MG tablet Commonly known as: DECADRON Take 1 tablet (6 mg total) by mouth daily for 4 days.   dorzolamidel-timolol 22.3-6.8 MG/ML Soln ophthalmic solution Commonly known as: COSOPT Place 1 drop into the right eye 2 (two) times daily.   ergocalciferol 1.25 MG (50000 UT) capsule Commonly known as: VITAMIN D2 Take 1 capsule (50,000 Units total) by mouth once a week. What changed: additional instructions   ferrous sulfate 324 MG Tbec Take 324 mg by mouth daily with breakfast.   latanoprost 0.005 % ophthalmic solution Commonly known as: XALATAN Place 1 drop into both eyes at bedtime.   metoCLOPramide 10 MG  tablet Commonly known as: REGLAN Take 10 mg by mouth every 6 (six) hours as needed.   metoprolol succinate 25 MG 24 hr tablet Commonly known as: TOPROL-XL Take 12.5 mg by mouth daily.   midodrine 10 MG tablet Commonly known as: PROAMATINE Take 10 mg by mouth 3 (three) times daily.   pantoprazole 40 MG tablet Commonly known as: Protonix Take 1 tablet (40 mg total) by mouth daily.   potassium chloride SA 20 MEQ tablet Commonly known as: KLOR-CON M Take 2 tablets (40 mEq total) by mouth daily. What changed:  how much to take additional instructions   pyridostigmine 60 MG tablet Commonly known as: MESTINON Take 60 mg by mouth 3 (three) times daily.   rosuvastatin 10 MG tablet Commonly known as: CRESTOR Take 10 mg by mouth at bedtime.   tamsulosin 0.4 MG Caps capsule Commonly  known as: FLOMAX Take 0.4 mg by mouth daily.   torsemide 20 MG tablet Commonly known as: DEMADEX Take 2 tablets po daily in am What changed:  how much to take how to take this when to take this additional instructions   Vitamin B 12 500 MCG Tabs Take 500 mcg by mouth daily.   zinc sulfate 220 (50 Zn) MG capsule Take 1 capsule (220 mg total) by mouth daily for 7 days.       No Known Allergies  Follow-up Information     Idelle Crouch, MD Follow up in 2 week(s).   Specialty: Internal Medicine Contact information: [place] [address] [place], [facility].   Specialty: Hospice and Palliative Medicine Contact information: [place] [address] [phone]                 The results of significant diagnostics from this hospitalization (including imaging, microbiology, ancillary and laboratory) are listed below for reference.    Significant Diagnostic Studies: ECHOCARDIOGRAM LIMITED  Result Date: 02/19/2022    ECHOCARDIOGRAM LIMITED REPORT   Patient Name:   SEVEN DOLLENS  Date of Exam: 02/19/2022 Medical Rec #:  [id number]        Height:       69.0 in Accession #:    [id number]       Weight:       179.9 lb Date of Birth:  08/31/1935        BSA:          1.975 m Patient Age:    64 years         BP:           90/53 mmHg Patient Gender: M                HR:           69 bpm. Exam Location:  [facility] Procedure: Limited Echo, Cardiac Doppler, Color Doppler and Intracardiac            Opacification Agent Indications:     elevated troponin  History:         Patient has prior history of Echocardiogram examinations, most                  recent 02/21/2021. CHF, CAD; Risk Factors:Hypertension and                  Dyslipidemia.  Sonographer:     Harvie Junior Referring Phys:  [id number] WPVXYIA AMIN Diagnosing Phys: Nelva Bush MD  Sonographer Comments: Technically difficult study due to poor echo windows, no parasternal window and suboptimal apical window. Image acquisition challenging due to respiratory motion. IMPRESSIONS  1. Left ventricular ejection fraction, by estimation, is 55 to 60%. The left ventricle has normal function. Left ventricular endocardial border not optimally defined to evaluate regional wall motion. Left ventricular diastolic parameters are consistent with Grade I diastolic dysfunction (impaired relaxation).  2. Right ventricular systolic function was not well visualized. The right ventricular size is not well visualized.  3. A small pericardial effusion is present. The pericardial effusion is surrounding the apex.  4. The inferior vena cava is normal in size with greater than 50% respiratory variability, suggesting right atrial pressure of 3 mmHg. FINDINGS  Left Ventricle: Left ventricular ejection fraction, by estimation, is 55 to 60%. The left ventricle has normal function. Left ventricular  endocardial border not optimally defined to evaluate regional wall motion. Definity contrast agent was given IV to delineate the left ventricular endocardial borders. Left ventricular  diastolic parameters are consistent with Grade I diastolic dysfunction (impaired relaxation). Right Ventricle: The right ventricular size is not well visualized. Right vetricular wall thickness was not well visualized. Right ventricular systolic function was not well visualized. Pericardium: A small pericardial effusion is present. The pericardial effusion is surrounding the apex. Aortic Valve: Aortic valve mean gradient measures 2.0 mmHg. Aortic valve peak gradient measures 3.3 mmHg. Venous: The inferior vena cava is normal in size with greater than 50% respiratory variability, suggesting right atrial pressure of 3 mmHg. IAS/Shunts: No atrial level shunt detected by color flow Doppler.  LV Volumes (MOD) LV vol d, MOD A2C: 43.1 ml Diastology LV vol d, MOD A4C: 84.5 ml LV e' medial:    4.46 cm/s LV vol s, MOD A2C: 19.1 ml LV E/e' medial:  14.6 LV vol s, MOD A4C: 36.4 ml LV e' lateral:   6.31 cm/s LV SV MOD A2C:     24.0 ml LV E/e' lateral: 10.3 LV SV MOD A4C:     84.5 ml LV SV MOD BP:      34.7 ml                             3D Volume EF:                            3D EF:        51 %                            LV EDV:       320 ml                            LV ESV:       158 ml                            LV SV:        161 ml RIGHT VENTRICLE RV S prime:     11.70 cm/s AORTIC VALVE AV Vmax:           91.40 cm/s AV Vmean:          62.600 cm/s AV VTI:            0.165 m AV Peak Grad:      3.3 mmHg AV Mean Grad:      2.0 mmHg LVOT Vmax:         75.50 cm/s LVOT Vmean:        48.200 cm/s LVOT VTI:          0.121 m LVOT/AV VTI ratio: 0.73 MITRAL VALVE MV Area (PHT): 3.54 cm    SHUNTS MV Decel Time: 214 msec    Systemic VTI: 0.12 m MV E velocity: 65.00 cm/s MV A velocity: 76.50 cm/s MV E/A ratio:  0.85 Harrell Gave End MD Electronically signed by Nelva Bush MD Signature Date/Time: 02/19/2022/6:16:54 PM    Final    CT Angio Chest PE W/Cm &/Or Wo Cm  Result Date: 02/18/2022 CLINICAL DATA:  Shortness of breath EXAM: CT  ANGIOGRAPHY CHEST WITH CONTRAST TECHNIQUE: Multidetector CT imaging of the chest was  performed using the standard protocol during bolus administration of intravenous contrast. Multiplanar CT image reconstructions and MIPs were obtained to evaluate the vascular anatomy. RADIATION DOSE REDUCTION: This exam was performed according to the departmental dose-optimization program which includes automated exposure control, adjustment of the mA and/or kV according to patient size and/or use of iterative reconstruction technique. CONTRAST:  19m OMNIPAQUE IOHEXOL 350 MG/ML SOLN COMPARISON:  11/19/2021 FINDINGS: Cardiovascular: Contrast injection is sufficient to demonstrate satisfactory opacification of the pulmonary arteries to the segmental level. There is no acute pulmonary embolus or evidence of right heart strain. Chronic filling defect of the left lower lobe artery is unchanged. The size of the main pulmonary artery is normal. Small pericardial effusion. There is calcific aortic atherosclerosis. There are coronary artery calcifications. Mediastinum/Nodes: Patulous and debris containing esophagus. No lymphadenopathy. Lungs/Pleura: Unchanged 4 mm right middle lobe nodule (5:73). Multifocal bilateral airspace opacities, greatest in the right lower lobe. Bibasilar atelectasis. Upper Abdomen: Contrast bolus timing is not optimized for evaluation of the abdominal organs. The visualized portions of the organs of the upper abdomen are normal. Musculoskeletal: Ankylosis and multilevel posterior instrumented fusion. No acute abnormality. Review of the MIP images confirms the above findings. IMPRESSION: 1. No acute pulmonary embolus. 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 2. Multifocal bilateral airspace opacities, greatest in the right lower lobe, concerning for multifocal pneumonia. 3. Patulous and debris containing esophagus. 4. Small pericardial effusion. 5. Unchanged 4 mm right middle lobe nodule.  No follow-up  required. Aortic Atherosclerosis (ICD10-I70.0). Electronically Signed   By: KUlyses JarredM.D.   On: 02/18/2022 21:32   DG Chest Portable 1 View  Result Date: 02/18/2022 CLINICAL DATA:  Shortness of breath EXAM: PORTABLE CHEST 1 VIEW COMPARISON:  Chest radiograph 07/20/2021, CT chest 11/19/2021 FINDINGS: The cardiomediastinal silhouette is within normal limits There are diffusely increased interstitial markings with patchy opacities in the lung bases, right more than left. There is a possible trace right pleural effusion. There is no pneumothorax There is no acute osseous abnormality. Spinal fusion hardware is again noted. IMPRESSION: Coarsened interstitial markings in both lungs with patchy opacities in the lung bases, right worse than left, suspicious for multifocal infection in the correct clinical setting. Suspect trace right pleural effusion. Electronically Signed   By: PValetta MoleM.D.   On: 02/18/2022 19:57    Microbiology: Recent Results (from the past 240 hour(s))  Resp panel by RT-PCR (RSV, Flu A&B, Covid) Anterior Nasal Swab     Status: Abnormal   Collection Time: 02/18/22  7:08 PM   Specimen: Anterior Nasal Swab  Result Value Ref Range Status   SARS Coronavirus 2 by RT PCR POSITIVE (A) NEGATIVE Final    Comment: (NOTE) SARS-CoV-2 target nucleic acids are DETECTED.  The SARS-CoV-2 RNA is generally detectable in upper respiratory specimens during the acute phase of infection. Positive results are indicative of the presence of the identified virus, but do not rule out bacterial infection or co-infection with other pathogens not detected by the test. Clinical correlation with patient history and other diagnostic information is necessary to determine patient infection status. The expected result is Negative.  Fact Sheet for Patients: hEntrepreneurPulse.com.au Fact Sheet for Healthcare Providers: hIncredibleEmployment.be This test is not yet  approved or cleared by the [place] and  has been authorized for detection and/or diagnosis of SARS-CoV-2 by FDA under an Emergency Use Authorization (EUA).  This EUA will remain in effect (meaning this test can be used) for the duration  of  the COVID-19 declaration under Section 564(b)(1) of the A ct, 21 U.S.C. section 360bbb-3(b)(1), unless the authorization is terminated or revoked sooner.     Influenza A by PCR NEGATIVE NEGATIVE Final   Influenza B by PCR NEGATIVE NEGATIVE Final    Comment: (NOTE) The Xpert Xpress SARS-CoV-2/FLU/RSV plus assay is intended as an aid in the diagnosis of influenza from Nasopharyngeal swab specimens and should not be used as a sole basis for treatment. Nasal washings and aspirates are unacceptable for Xpert Xpress SARS-CoV-2/FLU/RSV testing.  Fact Sheet for Patients: EntrepreneurPulse.com.au  Fact Sheet for Healthcare Providers: IncredibleEmployment.be  This test is not yet approved or cleared by the [place] FDA and has been authorized for detection and/or diagnosis of SARS-CoV-2 by FDA under an Emergency Use Authorization (EUA). This EUA will remain in effect (meaning this test can be used) for the duration of the COVID-19 declaration under Section 564(b)(1) of the Act, 21 U.S.C. section 360bbb-3(b)(1), unless the authorization is terminated or revoked.     Resp Syncytial Virus by PCR NEGATIVE NEGATIVE Final    Comment: (NOTE) Fact Sheet for Patients: EntrepreneurPulse.com.au  Fact Sheet for Healthcare Providers: IncredibleEmployment.be  This test is not yet approved or cleared by the [place] FDA and has been authorized for detection and/or diagnosis of SARS-CoV-2 by FDA under an Emergency Use Authorization (EUA). This EUA will remain in effect (meaning this test can be used) for the duration of the COVID-19 declaration under Section 564(b)(1) of  the Act, 21 U.S.C. section 360bbb-3(b)(1), unless the authorization is terminated or revoked.  Performed at [facility], [address] [place], [place] [address]   Blood culture (routine x 2)     Status: None   Collection Time: 02/18/22  7:14 PM   Specimen: BLOOD  Result Value Ref Range Status   Specimen Description BLOOD RIGHT ANTECUBITAL  Final   Special Requests   Final    BOTTLES DRAWN AEROBIC AND ANAEROBIC Blood Culture results may not be optimal due to an excessive volume of blood received in culture bottles   Culture   Final    NO GROWTH 5 DAYS Performed at [facility], [address], [place], [place] [address]    Report Status 02/23/2022 FINAL  Final  Blood culture (routine x 2)     Status: None   Collection Time: 02/18/22  8:42 PM   Specimen: BLOOD  Result Value Ref Range Status   Specimen Description BLOOD RIGHT UPPER ARM  Final   Special Requests   Final    BOTTLES DRAWN AEROBIC AND ANAEROBIC Blood Culture adequate volume   Culture   Final    NO GROWTH 5 DAYS Performed at [facility], [address], [place], [place] [address]    Report Status 02/23/2022 FINAL  Final  MRSA Next Gen by PCR, Nasal     Status: None   Collection Time: 02/19/22  8:05 AM   Specimen: Nasal Mucosa; Nasal Swab  Result Value Ref Range Status   MRSA by PCR Next Gen NOT DETECTED NOT DETECTED Final    Comment: (NOTE) The GeneXpert MRSA Assay (FDA approved for NASAL specimens only), is one component of a comprehensive MRSA colonization surveillance program. It is not intended to diagnose MRSA infection nor to guide or monitor treatment for MRSA infections. Test performance is not FDA approved in patients less than 43 years old. Performed at [facility], [address], [place], [place] [address]  Labs: Basic Metabolic Panel: Recent Labs  Lab 02/19/22 0255 02/20/22 0327 02/21/22 0244 02/22/22 0543 02/23/22 0552  NA 143 145 146* 145  151*  K 4.2 4.0 3.9 3.7 3.6  CL 105 107 107 110 111  CO2 '30 30 31 31 '$ 33*  GLUCOSE 121* 127* 91 96 102*  BUN 20 29* 27* 21 15  CREATININE 0.99 0.99 0.85 0.86 0.85  CALCIUM 8.4* 8.3* 8.2* 8.3* 8.3*  MG 2.0 2.2 2.3 2.1 2.1  PHOS  --  4.0 2.2* 2.0* 3.6   Liver Function Tests: Recent Labs  Lab 02/19/22 0255 02/20/22 0327 02/21/22 0244 02/22/22 0543 02/23/22 0552  AST '15 15 17 '$ 39 24  ALT '11 11 11 24 23  '$ ALKPHOS 65 66 71 70 63  BILITOT 1.6* 0.8 1.0 1.0 0.8  PROT 5.4* 5.3* 5.3* 5.4* 5.1*  ALBUMIN 2.6* 2.2* 2.3* 2.5* 2.3*   No results for input(s): "LIPASE", "AMYLASE" in the last 168 hours. No results for input(s): "AMMONIA" in the last 168 hours. CBC: Recent Labs  Lab 02/18/22 1907 02/19/22 0255 02/20/22 0327 02/21/22 0244 02/22/22 0543 02/23/22 0552  WBC 20.5* 23.0* 22.0* 15.2* 13.3* 12.5*  NEUTROABS 16.9*  --  18.7* 11.9* 9.2* 9.4*  HGB 15.2 13.2 12.6* 13.0 13.9 12.9*  HCT 49.1 42.1 42.1 41.9 44.0 42.0  MCV 96.7 97.7 99.5 97.7 94.8 96.3  PLT 171 143* 152 160 164 151   Cardiac Enzymes: No results for input(s): "CKTOTAL", "CKMB", "CKMBINDEX", "TROPONINI" in the last 168 hours. BNP: BNP (last 3 results) Recent Labs    02/24/21 1348 02/18/22 1908  BNP 104.5* 193.1*    ProBNP (last 3 results) No results for input(s): "PROBNP" in the last 8760 hours.  CBG: No results for input(s): "GLUCAP" in the last 168 hours.  Time spent: > 30 minutes were spent in preparing this discharge including medication reconciliation, counseling, and coordination of care.  Signed:  Siris Hoos Marry Guan, MD  [facility] 02/23/2022, 8:42 AM

## 2022-02-23 NOTE — Progress Notes (Signed)
EMS present for nonemergency discharge; discharge packet given to EMS personnel to take to the pt's house; pt discharged via stretcher on 3L portable O2 [place] by EMS personnel going home with hospice services

## 2022-02-23 NOTE — TOC Progression Note (Addendum)
Transition of Care [facility]) - Progression Note    Patient Details  Name: Chris Kent MRN: [id number] Date of Birth: [birth year]  [facility] CM/SW Contact  Izola Price, RN Phone Number: 02/23/2022, 11:21 AM  Clinical Narrative: 1/24: Damaris Schooner with daughter regarding HH/DME needs and obtaining these for discharge. Reviewed palliative consult notes regarding possible hospice need at discharge and daughter requested a referral and consult via [facility] prior to discharging rather than waiting and transitioning once at home to discuss options. Hospice liaison consulted with daughter and will arrange for hospice at home and make DME arrangements as well. Will let us know when everything including needed DME/Suction/Oxygen is ready and delivered.  Updated provider and Unit RN.   Barbie Khaden Gater RN CM   2 pm. Spoke with son regarding transportation to home. Patient is very weak and was on oxygen this am though on room air right now. Chronic oxygen at 1/L/MC at home already, no transport tanks. Grandson, who patient lives with, felt EMS would be safer to get him home and into house. Pending completion of DME delivery by hospice, EMS forms printed to unit, excluding DNR form. Simmie Davies RN CM   225 PM. [facility] called for transport after hospice liaison indicated everything in place for discharge to home. Simmie Davies RN CM  Expected Discharge Plan: [place] Barriers to Discharge: Continued Medical Work up  Expected Discharge Plan and [place] arrangements for the past 2 months: Single Family Home Expected Discharge Date: 02/23/22                         HH Arranged: PT HH Agency: [place] Date [place]: 02/20/22       Social Determinants of Health (SDOH) Interventions SDOH Screenings   Food Insecurity: No Food Insecurity (02/19/2022)  Housing: Low Risk  (02/19/2022)  Transportation Needs: No Transportation Needs  (02/19/2022)  Utilities: Not At Risk (02/19/2022)  Depression (PHQ2-9): Low Risk  (09/04/2021)  Tobacco Use: Low Risk  (02/20/2022)    Readmission Risk Interventions     No data to display

## 2022-02-23 NOTE — ED Notes (Addendum)
ED Provider at bedside. 

## 2022-02-23 NOTE — ED Triage Notes (Signed)
Patient was just dc from this facility today. He was dc home to hospice. When patient was taken home, it was found that he does not become a hospice patient until Monday 1/22. Family called EMS back stating patient was in resp distress, on 3L [place] saturating low 80's. Patient was brought to [facility] for the same.

## 2022-02-23 NOTE — Progress Notes (Signed)
Pt in bed, eyes closed, O2 therapy remains, no distress noted. V/s WNL. Bed locked and in lowest positions with sitter at bedside. Report given to AM RN.

## 2022-02-23 NOTE — Plan of Care (Signed)
  Problem: Fluid Volume: Goal: Hemodynamic stability will improve Outcome: Adequate for Discharge   Problem: Clinical Measurements: Goal: Diagnostic test results will improve Outcome: Adequate for Discharge Goal: Signs and symptoms of infection will decrease Outcome: Adequate for Discharge   Problem: Respiratory: Goal: Ability to maintain adequate ventilation will improve Outcome: Adequate for Discharge   Problem: Education: Goal: Knowledge of General Education information will improve Description: Including pain rating scale, medication(s)/side effects and non-pharmacologic comfort measures Outcome: Adequate for Discharge   Problem: Health Behavior/Discharge Planning: Goal: Ability to manage health-related needs will improve Outcome: Adequate for Discharge   Problem: Clinical Measurements: Goal: Ability to maintain clinical measurements within normal limits will improve Outcome: Adequate for Discharge Goal: Will remain free from infection Outcome: Adequate for Discharge Goal: Diagnostic test results will improve Outcome: Adequate for Discharge Goal: Respiratory complications will improve Outcome: Adequate for Discharge Goal: Cardiovascular complication will be avoided Outcome: Adequate for Discharge   Problem: Activity: Goal: Risk for activity intolerance will decrease Outcome: Adequate for Discharge   Problem: Nutrition: Goal: Adequate nutrition will be maintained Outcome: Adequate for Discharge   Problem: Coping: Goal: Level of anxiety will decrease Outcome: Adequate for Discharge   Problem: Elimination: Goal: Will not experience complications related to bowel motility Outcome: Adequate for Discharge Goal: Will not experience complications related to urinary retention Outcome: Adequate for Discharge   Problem: Pain Managment: Goal: General experience of comfort will improve Outcome: Adequate for Discharge   Problem: Safety: Goal: Ability to remain free  from injury will improve Outcome: Adequate for Discharge   Problem: Skin Integrity: Goal: Risk for impaired skin integrity will decrease Outcome: Adequate for Discharge   Problem: Education: Goal: Knowledge of risk factors and measures for prevention of condition will improve Outcome: Adequate for Discharge   Problem: Coping: Goal: Psychosocial and spiritual needs will be supported Outcome: Adequate for Discharge   Problem: Respiratory: Goal: Will maintain a patent airway Outcome: Adequate for Discharge Goal: Complications related to the disease process, condition or treatment will be avoided or minimized Outcome: Adequate for Discharge

## 2022-02-23 NOTE — Progress Notes (Signed)
MD order received in [facility] to discharge pt home with hospice including DME and home health today; [facility] arranging Hospice services through [place] (see separate note in [facility]); discharge envelope prepared for EMS personnel to take to the pt's residence; out of facility DNR and AVS placed in envelope along with EMS documentation that TOC previously sent to the 1c printer ([facility]); pt's discharge pending arrival of EMS for nonemergency transport

## 2022-02-23 NOTE — Discharge Instructions (Signed)
[place]

## 2022-02-23 NOTE — Progress Notes (Signed)
@  0018 Pt remains agitated, kicking and fighting, security x2 at bedside, 1:1 sitter ordered by provider. Meds ordered and commenced. Pt now fidgeting but less aggressive, left resting, care ongoing.

## 2022-02-24 ENCOUNTER — Encounter: Payer: Self-pay | Admitting: Urology

## 2022-02-24 ENCOUNTER — Encounter: Payer: Self-pay | Admitting: Family

## 2022-02-24 DIAGNOSIS — Z7901 Long term (current) use of anticoagulants: Secondary | ICD-10-CM

## 2022-02-24 DIAGNOSIS — Z86711 Personal history of pulmonary embolism: Secondary | ICD-10-CM

## 2022-02-24 DIAGNOSIS — J690 Pneumonitis due to inhalation of food and vomit: Secondary | ICD-10-CM | POA: Diagnosis not present

## 2022-02-24 DIAGNOSIS — R7989 Other specified abnormal findings of blood chemistry: Secondary | ICD-10-CM | POA: Insufficient documentation

## 2022-02-24 LAB — BASIC METABOLIC PANEL
Anion gap: 5 (ref 5–15)
BUN: 14 mg/dL (ref 8–23)
CO2: 32 mmol/L (ref 22–32)
Calcium: 8 mg/dL — ABNORMAL LOW (ref 8.9–10.3)
Chloride: 108 mmol/L (ref 98–111)
Creatinine, Ser: 0.86 mg/dL (ref 0.61–1.24)
GFR, Estimated: >60 mL/min (ref >60)
Glucose, Bld: 118 mg/dL — ABNORMAL HIGH (ref 70–99)
Potassium: 3.7 mmol/L (ref 3.5–5.1)
Sodium: 145 mmol/L (ref 135–145)

## 2022-02-24 LAB — CBC
HCT: 43 % (ref 39.0–52.0)
Hemoglobin: 13.4 g/dL (ref 13.0–17.0)
MCH: 30.3 pg (ref 26.0–34.0)
MCHC: 31.2 g/dL (ref 30.0–36.0)
MCV: 97.3 fL (ref 80.0–100.0)
Platelets: 151 10*3/uL (ref 150–400)
RBC: 4.42 MIL/uL (ref 4.22–5.81)
RDW: 16.1 % — ABNORMAL HIGH (ref 11.5–15.5)
WBC: 22.6 10*3/uL — ABNORMAL HIGH (ref 4.0–10.5)
nRBC: 0 % (ref 0.0–0.2)

## 2022-02-24 LAB — MRSA NEXT GEN BY PCR, NASAL: MRSA by PCR Next Gen: NOT DETECTED

## 2022-02-24 LAB — TROPONIN I (HIGH SENSITIVITY): Troponin I (High Sensitivity): 138 ng/L (ref <18)

## 2022-02-24 LAB — STREP PNEUMONIAE URINARY ANTIGEN: Strep Pneumo Urinary Antigen: NEGATIVE

## 2022-02-24 MED ORDER — VANCOMYCIN HCL 1500 MG/300ML IV SOLN: 1500.0000 mg | INTRAVENOUS | Status: DC

## 2022-02-24 MED ORDER — ONDANSETRON HCL 4 MG PO TABS: 4.0000 mg | ORAL_TABLET | Freq: Four times a day (QID) | ORAL | Status: DC | PRN

## 2022-02-24 MED ORDER — ACETAMINOPHEN 325 MG PO TABS: 650.0000 mg | ORAL_TABLET | Freq: Four times a day (QID) | ORAL | Status: DC | PRN

## 2022-02-24 MED ORDER — ENOXAPARIN SODIUM 100 MG/ML IJ SOSY
1.0000 mg/kg | PREFILLED_SYRINGE | Freq: Two times a day (BID) | INTRAMUSCULAR | Status: DC
  Administered 2022-02-24: 82.5 mg via SUBCUTANEOUS
  Filled 2022-02-24 (×2): qty 0.82

## 2022-02-24 MED ORDER — SODIUM CHLORIDE 0.9 % IV SOLN
500.0000 mg | INTRAVENOUS | Status: DC
  Administered 2022-02-24: 500 mg via INTRAVENOUS
  Filled 2022-02-24: qty 5

## 2022-02-24 MED ORDER — APIXABAN 5 MG PO TABS
5.0000 mg | ORAL_TABLET | Freq: Two times a day (BID) | ORAL | Status: DC
  Administered 2022-02-24: 5 mg via ORAL
  Filled 2022-02-24: qty 1

## 2022-02-24 MED ORDER — SODIUM CHLORIDE 0.9 % IV SOLN: 2.0000 g | Freq: Three times a day (TID) | INTRAVENOUS | Status: DC

## 2022-02-24 MED ORDER — MIDODRINE HCL 5 MG PO TABS
10.0000 mg | ORAL_TABLET | Freq: Three times a day (TID) | ORAL | Status: DC
  Administered 2022-02-24 (×2): 10 mg via ORAL
  Filled 2022-02-24 (×2): qty 2

## 2022-02-24 MED ORDER — ACETAMINOPHEN 325 MG RE SUPP: 650.0000 mg | Freq: Four times a day (QID) | RECTAL | Status: DC | PRN

## 2022-02-24 MED ORDER — LACTATED RINGERS IV SOLN: INTRAVENOUS | Status: AC

## 2022-02-24 MED ORDER — HYDRALAZINE HCL 20 MG/ML IJ SOLN: 5.0000 mg | Freq: Four times a day (QID) | INTRAMUSCULAR | Status: DC | PRN

## 2022-02-24 MED ORDER — DEXAMETHASONE 6 MG PO TABS
6.0000 mg | ORAL_TABLET | Freq: Every day | ORAL | Status: DC
  Administered 2022-02-24: 6 mg via ORAL
  Filled 2022-02-24: qty 1

## 2022-02-24 MED ORDER — TORSEMIDE 20 MG PO TABS
10.0000 mg | ORAL_TABLET | Freq: Every day | ORAL | Status: DC
  Administered 2022-02-24: 10 mg via ORAL
  Filled 2022-02-24: qty 1

## 2022-02-24 MED ORDER — METOPROLOL SUCCINATE ER 25 MG PO TB24
12.5000 mg | ORAL_TABLET | Freq: Every day | ORAL | Status: DC
  Administered 2022-02-24: 12.5 mg via ORAL
  Filled 2022-02-24: qty 1

## 2022-02-24 MED ORDER — VANCOMYCIN HCL IN DEXTROSE 1-5 GM/200ML-% IV SOLN
1000.0000 mg | Freq: Once | INTRAVENOUS | Status: AC
  Administered 2022-02-24: 1000 mg via INTRAVENOUS
  Filled 2022-02-24: qty 200

## 2022-02-24 MED ORDER — TAMSULOSIN HCL 0.4 MG PO CAPS
0.4000 mg | ORAL_CAPSULE | Freq: Every day | ORAL | Status: DC
  Administered 2022-02-24: 0.4 mg via ORAL
  Filled 2022-02-24: qty 1

## 2022-02-24 MED ORDER — SODIUM CHLORIDE 0.9 % IV SOLN
2.0000 g | Freq: Three times a day (TID) | INTRAVENOUS | Status: DC
  Administered 2022-02-24: 2 g via INTRAVENOUS
  Filled 2022-02-24 (×2): qty 12.5

## 2022-02-24 MED ORDER — ONDANSETRON HCL 4 MG/2ML IJ SOLN: 4.0000 mg | Freq: Four times a day (QID) | INTRAMUSCULAR | Status: DC | PRN

## 2022-02-24 MED ORDER — ALBUTEROL SULFATE (2.5 MG/3ML) 0.083% IN NEBU: 2.5000 mg | INHALATION_SOLUTION | RESPIRATORY_TRACT | Status: DC | PRN

## 2022-02-24 MED ORDER — VANCOMYCIN HCL 1750 MG/350ML IV SOLN: 1750.0000 mg | INTRAVENOUS | Status: DC

## 2022-02-24 MED ORDER — METOCLOPRAMIDE HCL 10 MG PO TABS: 10.0000 mg | ORAL_TABLET | Freq: Four times a day (QID) | ORAL | Status: DC | PRN

## 2022-02-24 NOTE — Assessment & Plan Note (Signed)
Chronic anticoagulation Hold Eliquis as patient n.p.o. Therapeutic Lovenox while n.p.o. CTA chest PE protocol on 1/15 showed a chronic left lower lobe embolus

## 2022-02-24 NOTE — Assessment & Plan Note (Addendum)
Acute respiratory failure with hypoxia Esophageal stricture with high risk of aspiration-previously declined feeding tube WBC 19,000 and requiring NRB to maintain sats in the mid 90s.  Was discharged a few hours earlier on room air Suspect acute aspiration episode Keep n.p.o. Will continue vancomycin and cefepime Aspiration precautions Continue supplemental oxygen Might consider Solu-Medrol for  anti-inflammatory effect related to aspiration pneumonitis Patient was seen by palliative care on 1/17

## 2022-02-24 NOTE — Progress Notes (Signed)
Pharmacy Antibiotic Note  Chris Kent is a 87 y.o. male admitted on 02/23/2022 with pneumonia.  Pharmacy has been consulted for Vancomycin dosing. Afeb, WBC trending up.   Plan: Patient received vancomycin 2000 mg x 1 in the ED and ordered vancomycin 1750 mg q24H. Will adjust vancomycin to 1500 mg q24H with a predicted AUC of 507. Goal AUC 400-600. Vd 0.72, Scr 0.86, IBW. Plan to order vancomycin level after 4th or 5th dose. Ordered a new MRSA PCR for today. If negative recommend to discontinue vancomycin.   Continue cefepime 2 g q8H.   Continue azithromycin 500 mg daily.    Height: '5\' 9"'$  (175.3 cm) Weight: 72.6 kg (160 lb) IBW/kg (Calculated) : 70.7  Temp (24hrs), Avg:98 F (36.7 C), Min:97.9 F (36.6 C), Max:98.1 F (36.7 C)  Recent Labs  Lab 02/18/22 2042 02/18/22 2108 02/19/22 0100 02/19/22 0255 02/20/22 0327 02/21/22 0244 02/22/22 0543 02/23/22 0552 02/23/22 2237 02/24/22 0443  WBC  --   --   --  23.0*   < > 15.2* 13.3* 12.5* 19.5* 22.6*  CREATININE  --   --   --  0.99   < > 0.85 0.86 0.85 0.87 0.86  LATICACIDVEN 2.0* 1.7 2.1* 1.7  --   --   --   --   --   --    < > = values in this interval not displayed.     Estimated Creatinine Clearance: 62.8 mL/min (by C-G formula based on SCr of 0.86 mg/dL).    No Known Allergies  Microbiology results: 1/20 BCx: pending 1/16  MRSA PCR:  negative  1/21 MRSA PCR: ordered.   Thank you for allowing pharmacy to be a part of this patient's care.  Chris Kent 02/24/2022 7:55 AM

## 2022-02-24 NOTE — Progress Notes (Signed)
PROGRESS NOTE  MACKLIN JACQUIN WNU:[id number] DOB: November 23, 1935 DOA: 02/23/2022 PCP: Idelle Crouch, MD  Hospital Course/Subjective: 87 year old male with known history of dementia, anxiety, arthritis, asthma, CHF on 2 L oxygen at night who was admitted at this facility from 1/15 to 1/20 due to hypoxic respiratory failure as result of COVID-19 infection, and aspiration pneumonia.  Patient has esophageal stricture and very high risk of aspiration.  During his last hospital stay, discussed with patient and family his very high risk of recurrent aspiration, after discussion with palliative care patient was made DNR with anticipated eventual transition to home hospice care, however family at that time was not ready for transition to comfort care.  Patient was discharged home on 1/20, likely had a recurrent aspiration event and returned to the hospital later that evening due to recurrent hypoxia.  This morning, patient is seen and examined in the ER he is resting comfortably on supplemental oxygen, says that he feels well and wants to eat.  Assessment and Plan: Recurrent aspiration pneumonia ([place]) Acute respiratory failure with hypoxia Esophageal stricture with high risk of aspiration-previously declined feeding tube WBC 19,000 and requiring NRB to maintain sats in the mid 90s.   Suspect acute aspiration episode, which is certainly a risk for him Discussed again this morning with the patient as well as his daughter Almyra Free over the phone, they would like him to continue with oral intake for his comfort Will continue vancomycin and cefepime Aspiration precautions Continue supplemental oxygen Patient was seen by palliative care on 1/17, this morning June is requesting that we keep her dad comfortable, she is interested in pursuing inpatient hospice.  Will consult.  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 status  Essential hypertension, benign Hydralazine IV as needed while n.p.o.  DVT Prophylaxis: Eliquis  Code Status: DNR  Family Communication: Discussed with patient as well as with his daughter June over the phone this morning.  Disposition Plan: Anticipating discharge to inpatient hospice  Antimicrobials: Anti-infectives (From admission, onward)    Start     Dose/Rate Route Frequency Ordered Stop   02/25/22 2200  vancomycin (VANCOREADY) IVPB 1500 mg/300 mL        1,500 mg 150 mL/hr over 120 Minutes Intravenous Every 24 hours 02/24/22 0801     02/25/22 0100  vancomycin (VANCOREADY) IVPB 1750 mg/350 mL  Status:  Discontinued        1,750 mg 175 mL/hr over 120 Minutes Intravenous Every 24 hours 02/24/22 0143 02/24/22 0801   02/24/22 0900  ceFEPIme (MAXIPIME) 2 g in sodium chloride 0.9 % 100 mL IVPB        2 g 200 mL/hr over 30 Minutes Intravenous Every 8 hours 02/24/22 0124     02/24/22 0800  ceFEPIme (MAXIPIME) 2 g in sodium chloride 0.9 % 100 mL IVPB  Status:  Discontinued        2 g 200 mL/hr over 30 Minutes Intravenous Every 8 hours 02/24/22 0124 02/24/22 0124   02/24/22 0145  vancomycin (VANCOCIN) IVPB 1000 mg/200 mL premix        1,000 mg 200 mL/hr  over 60 Minutes Intravenous  Once 02/24/22 0136 02/24/22 0329   02/24/22 0130  azithromycin (ZITHROMAX) 500 mg in sodium chloride 0.9 % 250 mL IVPB        500 mg 250 mL/hr over 60 Minutes Intravenous Every 24 hours 02/24/22 0121 03-31-22 0129   02/24/22 0130  ceFEPIme (MAXIPIME) 2 g in sodium chloride 0.9 % 100 mL IVPB  Status:  Discontinued        2 g 200 mL/hr over 30 Minutes Intravenous  Every 8 hours 02/24/22 0121 02/24/22 0124   02/23/22 2330  vancomycin (VANCOCIN) IVPB 1000 mg/200 mL premix        1,000 mg 200 mL/hr over 60 Minutes Intravenous  Once 02/23/22 2316 02/24/22 0215   02/23/22 2330  ceFEPIme (MAXIPIME) 2 g in sodium chloride 0.9 % 100 mL IVPB        2 g 200 mL/hr over 30 Minutes Intravenous  Once 02/23/22 2316 02/24/22 0114       Objective: Vitals:   02/24/22 0400 02/24/22 0430 02/24/22 0600 02/24/22 0700  BP: 92/67 132/77 108/74 (!) 164/73  Pulse: 67 72 62 89  Resp: 17 (!) 26  20  Temp:   97.9 F (36.6 C)   TempSrc:   Oral   SpO2: 98% 92% 91% 95%  Weight:      Height:       No intake or output data in the 24 hours ending 02/24/22 0831 Filed Weights   02/24/22 0226  Weight: 72.6 kg   Exam: General:  Alert, oriented, calm, in no acute distress, currently on 6 L Eyes: EOMI, clear sclerea Neck: supple, no masses, trachea mildline  Cardiovascular: RRR, no murmurs or rubs, no peripheral edema  Respiratory: clear to auscultation bilaterally, no wheezes, no crackles, some diffuse rhonchi Abdomen: soft, nontender, nondistended, normal bowel tones heard  Skin: dry, no rashes  Musculoskeletal: no joint effusions, normal range of motion  Psychiatric: appropriate affect, normal speech  Neurologic: extraocular muscles intact, clear speech, moving all extremities with intact sensorium   Data Reviewed: CBC: Recent Labs  Lab 02/20/22 0327 02/21/22 0244 02/22/22 0543 02/23/22 0552 02/23/22 2237 02/24/22 0443  WBC 22.0* 15.2* 13.3* 12.5* 19.5* 22.6*  NEUTROABS 18.7* 11.9* 9.2* 9.4* 16.7*  --   HGB 12.6* 13.0 13.9 12.9* 14.8 13.4  HCT 42.1 41.9 44.0 42.0 47.7 43.0  MCV 99.5 97.7 94.8 96.3 97.3 97.3  PLT 152 160 164 151 169 944   Basic Metabolic Panel: Recent Labs  Lab 02/19/22 0255 02/20/22 0327 02/21/22 0244 02/22/22 0543 02/23/22 0552 02/23/22 2237 02/24/22 0443  NA 143 145 146* 145 151* 146* 145  K 4.2 4.0 3.9 3.7 3.6 4.5 3.7  CL  105 107 107 110 111 107 108  CO2 '30 30 31 31 '$ 33* 32 32  GLUCOSE 121* 127* 91 96 102* 170* 118*  BUN 20 29* 27* '21 15 15 14  '$ CREATININE 0.99 0.99 0.85 0.86 0.85 0.87 0.86  CALCIUM 8.4* 8.3* 8.2* 8.3* 8.3* 8.5* 8.0*  MG 2.0 2.2 2.3 2.1 2.1  --   --   PHOS  --  4.0 2.2* 2.0* 3.6  --   --    GFR: Estimated Creatinine Clearance: 62.8 mL/min (by C-G formula based on SCr of 0.86 mg/dL). Liver Function Tests: Recent Labs  Lab 02/19/22 0255 02/20/22 0327 02/21/22 0244 02/22/22 0543 02/23/22 0552  AST '15 15 17 '$ 39 24  ALT '11 11 11 24 23  '$ ALKPHOS 65 66 71 70 63  BILITOT 1.6* 0.8 1.0 1.0 0.8  PROT 5.4* 5.3* 5.3* 5.4* 5.1*  ALBUMIN 2.6* 2.2* 2.3* 2.5* 2.3*   No results for input(s): "LIPASE", "AMYLASE" in the last 168 hours. No results for input(s): "AMMONIA" in the last 168 hours. Coagulation Profile: Recent Labs  Lab 02/19/22 0100  INR 2.2*   Cardiac Enzymes: No results for input(s): "CKTOTAL", "CKMB", "CKMBINDEX", "TROPONINI" in the last 168 hours. BNP (last 3 results) No results for input(s): "PROBNP" in the last 8760 hours. HbA1C: No results for input(s): "HGBA1C" in the last 72 hours. CBG: No results for input(s): "GLUCAP" in the last 168 hours. Lipid Profile: No results for input(s): "CHOL", "HDL", "LDLCALC", "TRIG", "CHOLHDL", "LDLDIRECT" in the last 72 hours. Thyroid Function Tests: No results for input(s): "TSH", "T4TOTAL", "FREET4", "T3FREE", "THYROIDAB" in the last 72 hours. Anemia Panel: No results for input(s): "VITAMINB12", "FOLATE", "FERRITIN", "TIBC", "IRON", "RETICCTPCT" in the last 72 hours. Urine analysis:    Component Value Date/Time   COLORURINE YELLOW (A) 02/19/2022 0130   APPEARANCEUR CLEAR (A) 02/19/2022 0130   APPEARANCEUR Clear 11/21/2017 1011   LABSPEC >1.046 (H) 02/19/2022 0130   LABSPEC 1.013 09/20/2012 0122   PHURINE 6.0 02/19/2022 0130   GLUCOSEU NEGATIVE 02/19/2022 0130   GLUCOSEU Negative 09/20/2012 0122   HGBUR NEGATIVE 02/19/2022 0130    BILIRUBINUR NEGATIVE 02/19/2022 0130   BILIRUBINUR Negative 11/21/2017 1011   BILIRUBINUR Negative 09/20/2012 0122   KETONESUR NEGATIVE 02/19/2022 0130   PROTEINUR NEGATIVE 02/19/2022 0130   NITRITE NEGATIVE 02/19/2022 0130   LEUKOCYTESUR NEGATIVE 02/19/2022 0130   LEUKOCYTESUR Negative 09/20/2012 0122   Sepsis Labs: '@LABRCNTIP'$ (procalcitonin:4,lacticidven:4)  ) Recent Results (from the past 240 hour(s))  Resp panel by RT-PCR (RSV, Flu A&B, Covid) Anterior Nasal Swab     Status: Abnormal   Collection Time: 02/18/22  7:08 PM   Specimen: Anterior Nasal Swab  Result Value Ref Range Status   SARS Coronavirus 2 by RT PCR POSITIVE (A) NEGATIVE Final    Comment: (NOTE) SARS-CoV-2 target nucleic acids are DETECTED.  The SARS-CoV-2 RNA is generally detectable in upper respiratory specimens during the acute phase of infection. Positive results are indicative of the presence of the identified virus, but do not rule out bacterial infection or co-infection with other pathogens not detected by the test. Clinical correlation with patient history and other diagnostic information is necessary to determine patient infection status. The expected result is Negative.  Fact Sheet for Patients: EntrepreneurPulse.com.au  Fact Sheet for Healthcare Providers: IncredibleEmployment.be  This test is not yet approved or cleared by the [place] FDA and  has been authorized for detection and/or diagnosis of SARS-CoV-2 by FDA under an Emergency Use Authorization (EUA).  This EUA will remain in effect (meaning this test can be used) for the duration of  the COVID-19 declaration under Section 564(b)(1) of the A ct, 21 U.S.C. section 360bbb-3(b)(1), unless the authorization is terminated or revoked sooner.     Influenza A by PCR NEGATIVE NEGATIVE Final   Influenza B by PCR NEGATIVE NEGATIVE Final    Comment: (NOTE) The Xpert Xpress SARS-CoV-2/FLU/RSV plus assay  is intended as an aid in the diagnosis of influenza from Nasopharyngeal swab specimens and should not be used as a sole basis for treatment. Nasal washings and aspirates are unacceptable for Xpert Xpress SARS-CoV-2/FLU/RSV testing.  Fact Sheet for Patients: EntrepreneurPulse.com.au  Fact Sheet for Healthcare Providers: IncredibleEmployment.be  This test is not yet approved or cleared by the [place] FDA and has been authorized for detection  and/or diagnosis of SARS-CoV-2 by FDA under an Emergency Use Authorization (EUA). This EUA will remain in effect (meaning this test can be used) for the duration of the COVID-19 declaration under Section 564(b)(1) of the Act, 21 U.S.C. section 360bbb-3(b)(1), unless the authorization is terminated or revoked.     Resp Syncytial Virus by PCR NEGATIVE NEGATIVE Final    Comment: (NOTE) Fact Sheet for Patients: EntrepreneurPulse.com.au  Fact Sheet for Healthcare Providers: IncredibleEmployment.be  This test is not yet approved or cleared by the [place] FDA and has been authorized for detection and/or diagnosis of SARS-CoV-2 by FDA under an Emergency Use Authorization (EUA). This EUA will remain in effect (meaning this test can be used) for the duration of the COVID-19 declaration under Section 564(b)(1) of the Act, 21 U.S.C. section 360bbb-3(b)(1), unless the authorization is terminated or revoked.  Performed at [facility], [address] [place], [place] [address]   Blood culture (routine x 2)     Status: None   Collection Time: 02/18/22  7:14 PM   Specimen: BLOOD  Result Value Ref Range Status   Specimen Description BLOOD RIGHT ANTECUBITAL  Final   Special Requests   Final    BOTTLES DRAWN AEROBIC AND ANAEROBIC Blood Culture results may not be optimal due to an excessive volume of blood received in culture bottles   Culture   Final    NO  GROWTH 5 DAYS Performed at [facility], [address], [place], [place] [address]    Report Status 02/23/2022 FINAL  Final  Blood culture (routine x 2)     Status: None   Collection Time: 02/18/22  8:42 PM   Specimen: BLOOD  Result Value Ref Range Status   Specimen Description BLOOD RIGHT UPPER ARM  Final   Special Requests   Final    BOTTLES DRAWN AEROBIC AND ANAEROBIC Blood Culture adequate volume   Culture   Final    NO GROWTH 5 DAYS Performed at [facility], [address] [place], [place] [address]    Report Status 02/23/2022 FINAL  Final  MRSA Next Gen by PCR, Nasal     Status: None   Collection Time: 02/19/22  8:05 AM   Specimen: Nasal Mucosa; Nasal Swab  Result Value Ref Range Status   MRSA by PCR Next Gen NOT DETECTED NOT DETECTED Final    Comment: (NOTE) The GeneXpert MRSA Assay (FDA approved for NASAL specimens only), is one component of a comprehensive MRSA colonization surveillance program. It is not intended to diagnose MRSA infection nor to guide or monitor treatment for MRSA infections. Test performance is not FDA approved in patients less than 79 years old. Performed at [facility], [address] [place], [place] [address]   Blood culture (routine x 2)     Status: None (Preliminary result)   Collection Time: 02/23/22 11:38 PM   Specimen: BLOOD  Result Value Ref Range Status   Specimen Description BLOOD BLOOD RIGHT ARM  Final   Special Requests   Final    BOTTLES DRAWN AEROBIC AND ANAEROBIC Blood Culture adequate volume   Culture   Final    NO GROWTH < 12 HOURS Performed at [facility], [address], [place], [place] [address]    Report Status PENDING  Incomplete  Blood culture (routine x 2)     Status: None (Preliminary result)   Collection Time: 02/24/22 12:29 AM   Specimen: BLOOD  Result Value Ref Range Status  Specimen Description BLOOD BLOOD RIGHT HAND  Final   Special Requests   Final    BOTTLES  DRAWN AEROBIC AND ANAEROBIC Blood Culture adequate volume   Culture   Final    NO GROWTH < 12 HOURS Performed at [facility], [address], [place], [place] [address]    Report Status PENDING  Incomplete     Studies: DG Chest Portable 1 View  Result Date: 02/23/2022 CLINICAL DATA:  Shortness of breath. EXAM: PORTABLE CHEST 1 VIEW COMPARISON:  February 18, 2022 FINDINGS: The heart size and mediastinal contours are within normal limits. There is moderate severity calcification of the aortic arch. Diffuse, chronic appearing increased lung markings are seen. Mild, stable atelectasis and/or infiltrate is noted within the left lung base. Very small, stable bilateral pleural effusions are noted. No pneumothorax is identified. Postoperative changes are seen within the lower cervical spine and thoracolumbar spine. IMPRESSION: 1. Mild, stable left basilar atelectasis and/or infiltrate. 2. Very small, stable bilateral pleural effusions. Electronically Signed   By: Virgina Norfolk M.D.   On: 02/23/2022 22:39    Scheduled Meds:  enoxaparin (LOVENOX) injection  1 mg/kg Subcutaneous Q12H    Continuous Infusions:  azithromycin Stopped (02/24/22 0430)   ceFEPime (MAXIPIME) IV     lactated ringers 100 mL/hr at 02/24/22 0222   [START ON 02/25/2022] vancomycin       LOS: 1 day   Time spent: 31 minutes  Nieko Clarin Marry Guan, MD [facility] Pager [phone]  If 7PM-7AM, please contact night-coverage [email or web address] Password [facility] 02/24/2022, 8:31 AM

## 2022-02-24 NOTE — Progress Notes (Signed)
Pharmacy Antibiotic Note  Chris Kent is a 87 y.o. male admitted on 02/23/2022 with pneumonia.  Pharmacy has been consulted for Vancomycin dosing.  Plan: Vancomycin 1 gm IV X 1 given in ED on 1/21 @ 0115. Additional Vanc 1 gm X 1 ordered to make total loading dose of 2 gm.   Vancomycin 1750 mg IV Q24H ordered to start on 1/22 @ 0100.   AUC = 532.6 Vanc trough = 11.8      Temp (24hrs), Avg:98.2 F (36.8 C), Min:98.1 F (36.7 C), Max:98.3 F (36.8 C)  Recent Labs  Lab 02/18/22 2042 02/18/22 2108 02/19/22 0100 02/19/22 0255 02/20/22 0327 02/21/22 0244 02/22/22 0543 02/23/22 0552 02/23/22 2237  WBC  --   --   --  23.0* 22.0* 15.2* 13.3* 12.5* 19.5*  CREATININE  --   --   --  0.99 0.99 0.85 0.86 0.85 0.87  LATICACIDVEN 2.0* 1.7 2.1* 1.7  --   --   --   --   --     Estimated Creatinine Clearance: 62.1 mL/min (by C-G formula based on SCr of 0.87 mg/dL).    No Known Allergies  Antimicrobials this admission:   >>    >>   Dose adjustments this admission:   Microbiology results:  BCx:   UCx:    Sputum:    MRSA PCR:   Thank you for allowing pharmacy to be a part of this patient's care.  Nusayba Cadenas D 02/24/2022 1:47 AM

## 2022-02-24 NOTE — Progress Notes (Signed)
[facility] ED [address] [facility]) Hospice hospital liaison note   Referral received for Hospice Home for patient that had previously been referred to hospice at home but wound up back in ED prior to hospice assessment.   Patient is aproved and the hospice home is able to accept patient this evening anytime after 7.    RN staff, you may call report at any time to [phone], room is assigned when report is called.  Please leave IV intact.    Please send completed DNR with patient.   Updated attending and [facility] manager via [place]. Thank you for the opportunity to participate in this patient's care Jhonnie Garner, BSN, RN Hospice nurse liaison [phone]

## 2022-02-24 NOTE — Progress Notes (Signed)
ANTICOAGULATION CONSULT NOTE - Initial Consult  Pharmacy Consult for Lovenox  Indication: pulmonary embolus  No Known Allergies  Patient Measurements:   Heparin Dosing Weight:   Vital Signs: BP: 129/69 (01/21 0000) Pulse Rate: 71 (01/21 0000)  Labs: Recent Labs    02/22/22 0543 02/23/22 0552 02/23/22 2237 02/24/22 0029  HGB 13.9 12.9* 14.8  --   HCT 44.0 42.0 47.7  --   PLT 164 151 169  --   CREATININE 0.86 0.85 0.87  --   TROPONINIHS  --   --  153* 138*    Estimated Creatinine Clearance: 62.1 mL/min (by C-G formula based on SCr of 0.87 mg/dL).   Medical History: Past Medical History:  Diagnosis Date   Anxiety    Arthritis    Asthma    CHF (congestive heart failure) (HCC)    Coronary artery disease    GERD (gastroesophageal reflux disease)    Glaucoma    Glaucoma    History of stroke    Hyperlipidemia    Hyperlipidemia    Hypertension    Peripheral vascular disease (HCC)    Sleep apnea    Stroke ([place])     Medications:  (Not in a hospital admission)   Assessment: Pharmacy consulted to dose lovenox in this 87 year old male with hx of massive PE.  Pt was on Eliquis 5 mg PO BID PTA, last dose on 1/20 AM but is now NPO. CrCl = 62.1 ml/min   Goal of Therapy:  Prevention/Tx of PE Monitor platelets by anticoagulation protocol: Yes   Plan:  Lovenox 82.5 (1 mg/kg) SQ Q12H ordered to start on 1/21 @ ~ 0200.  Will check CBC daily.   Mechel Haggard D 02/24/2022,1:44 AM

## 2022-02-24 NOTE — Assessment & Plan Note (Signed)
Holding meds tonight due to n.p.o. status

## 2022-02-24 NOTE — Assessment & Plan Note (Addendum)
Holding all meds due to suspicion for aspiration

## 2022-02-24 NOTE — Assessment & Plan Note (Addendum)
Hydralazine IV as needed while n.p.o. 

## 2022-02-24 NOTE — ED Notes (Signed)
This rn to bedside. Nasal cannula not in nose and pt positioned leaning toward left side of railing. Nasal cannula replaced, pt repositioned. Call light placed in reach, WCTM.

## 2022-02-24 NOTE — ED Notes (Signed)
Patient resting comfortably in stretcher. NAD noted. Respirations even and unlabored.

## 2022-02-24 NOTE — IPAL (Addendum)
  Interdisciplinary Goals of Care Family Meeting   Date carried out: 02/24/2022  Location of the meeting: Phone conference  Member's involved: Physician and Family Member or next of kin  Durable Power of Attorney or acting medical decision maker: daughter, June Parrish    Discussion: We discussed goals of care for [employer] .   I have reviewed medical records including EPIC notes, labs and imaging, assessed the patient and then met with daughter on phone to discuss major active diagnoses, plan of care, natural trajectory, prognosis, GOC, EOL wishes, disposition and options including Full code/DNI/DNR and the concept of comfort care if DNR is elected. Questions and concerns were addressed. She in agreement to continue current plan of care.she states her father will not want to be in the ICU.  She wants to continue his DNR status but does not feel ready to make a decision on comfort care and would like to defer until he is able to participate in a family conference and make that decision along with her.  Code status:   Code Status: DNR   Disposition: Continue current acute care  Time spent for the meeting: [place], MD  02/24/2022, 1:29 AM

## 2022-02-24 NOTE — H&P (Signed)
History and Physical    Patient: Chris Kent MVH:[id number] DOB: [birth year] DOA: 02/23/2022 DOS: the patient was seen and examined on 02/24/2022 PCP: Idelle Crouch, MD  Patient coming from: Home  Chief Complaint:  Chief Complaint  Patient presents with   Shortness of Breath    HPI: Chris Kent is a 87 y.o. male with medical history significant for dementia, anxiety, arthritis, asthma, CHF newly on 2 L of O2 at night and as needed, CAD, , CVA, HTN, PVD and OSA, esophageal dysmotility with frequent choking who has declined feeding tube in the past, hospitalized from 1/15 -02/23/2022 with COVID, aspiration pneumonia and acute respiratory failure, weaned to room air prior to discharge who returns to the ED several hours after discharge in respiratory distress requiring NRB to maintain sats in the low 90s.  Patient was seen by palliative care during his stay with plans for home hospice however on his arrival home, according to family they found out that hospice would not be set up for another 2 days until after the weekend.  When family developed respiratory distress, they did not feel comfortable taking care of patient and thus brought him back to the hospital.  On arrival of EMS he was saturating in the 80s on 3 L and was placed on NRB. ED course and data review: Afebrile with pulse 81, BP 125/64, tachypneic to 24 with O2 sat 95% on 15 L NRB.WBC 19,500 up from 12,002 days prior troponin 155 but down from the 2 and 300s a few days prior BMP unremarkable.  Chest x-ray was stable showing the following: IMPRESSION: 1. Mild, stable left basilar atelectasis and/or infiltrate. 2. Very small, stable bilateral pleural effusions.   Patient started on vancomycin and cefepime and hospitalist consulted for admission.     Past Medical History:  Diagnosis Date   Anxiety    Arthritis    Asthma    CHF (congestive heart failure) ([place])    Coronary artery disease    GERD (gastroesophageal reflux  disease)    Glaucoma    Glaucoma    History of stroke    Hyperlipidemia    Hyperlipidemia    Hypertension    Peripheral vascular disease ([place])    Sleep apnea    Stroke [facility])    Past Surgical History:  Procedure Laterality Date   ESOPHAGOGASTRODUODENOSCOPY (EGD) WITH PROPOFOL N/A 07/19/2021   Procedure: ESOPHAGOGASTRODUODENOSCOPY (EGD) WITH PROPOFOL;  Surgeon: Annamaria Helling, DO;  Location: [facility] ENDOSCOPY;  Service: Gastroenterology;  Laterality: N/A;   REPLACEMENT TOTAL KNEE BILATERAL     Social History:  reports that he has never smoked. He has never used smokeless tobacco. He reports that he does not drink alcohol and does not use drugs.  No Known Allergies  Family History  Problem Relation Age of Onset   Prostate cancer Father     Prior to Admission medications   Medication Sig Start Date End Date Taking? Authorizing Provider  amoxicillin-clavulanate (AUGMENTIN) 500-125 MG tablet Take 1 tablet by mouth 3 (three) times daily for 4 days. 02/23/22 02/27/22 Yes Hollice Gong, Mir Mohammed, MD  apixaban (ELIQUIS) 5 MG TABS tablet Take two tablets twice a day for six days then one tablet twice a day afterwards Patient taking differently: Take 5 mg by mouth 2 (two) times daily. 02/26/21  Yes Wieting, Richard, MD  calcium carbonate (OSCAL) 1500 (600 Ca) MG TABS tablet Take 600 mg of elemental calcium by mouth daily with breakfast.   Yes [provider]  Cyanocobalamin (VITAMIN B 12) 500 MCG TABS Take 500 mcg by mouth daily. 02/26/21  Yes Wieting, Richard, MD  dexamethasone (DECADRON) 6 MG tablet Take 1 tablet (6 mg total) by mouth daily for 4 days. 02/23/22 02/27/22 Yes Ikramullah, Mir Mohammed, MD  Dorzolamide HCl-Timolol Mal PF 22.3-6.8 MG/ML SOLN Place 1 drop into the right eye 2 (two) times daily.   Yes [provider]  ergocalciferol (VITAMIN D2) 1.25 MG (50000 UT) capsule Take 1 capsule (50,000 Units total) by mouth once a week. Patient taking differently: Take  50,000 Units by mouth once a week. Saturday mornings 07/05/21  Yes Hackney, Otila Kluver A, FNP  ferrous sulfate 324 MG TBEC Take 324 mg by mouth daily with breakfast.   Yes [provider]  latanoprost (XALATAN) 0.005 % ophthalmic solution Place 1 drop into both eyes at bedtime.   Yes [provider]  metoCLOPramide (REGLAN) 10 MG tablet Take 10 mg by mouth every 6 (six) hours as needed. 02/14/22 02/24/22 Yes [provider]  metoprolol succinate (TOPROL-XL) 25 MG 24 hr tablet Take 12.5 mg by mouth daily. 06/03/21  Yes [provider]  midodrine (PROAMATINE) 10 MG tablet Take 10 mg by mouth 3 (three) times daily. 06/13/21  Yes [provider]  potassium chloride SA (KLOR-CON M) 20 MEQ tablet Take 2 tablets (40 mEq total) by mouth daily. Patient taking differently: Take 60 mEq by mouth daily. Taking 3 tablets 07/05/21  Yes Hackney, Tina A, FNP  pyridostigmine (MESTINON) 60 MG tablet Take 60 mg by mouth 3 (three) times daily.   Yes [provider]  rosuvastatin (CRESTOR) 10 MG tablet Take 10 mg by mouth at bedtime.   Yes [provider]  tamsulosin (FLOMAX) 0.4 MG CAPS capsule Take 0.4 mg by mouth daily. 12/22/21  Yes [provider]  torsemide (DEMADEX) 20 MG tablet Take 2 tablets po daily in am Patient taking differently: Take 10 mg by mouth daily. 02/27/21  Yes Wieting, Richard, MD  zinc sulfate 220 (50 Zn) MG capsule Take 1 capsule (220 mg total) by mouth daily for 7 days. 02/23/22 03/02/22 Yes Ikramullah, Mir Mohammed, MD  pantoprazole (PROTONIX) 40 MG tablet Take 1 tablet (40 mg total) by mouth daily. Patient not taking: Reported on 02/18/2022 07/20/21   Sharen Hones, MD    Physical Exam: Vitals:   02/23/22 2216 02/23/22 2217  BP:  125/64  Pulse:  81  Resp:  (!) 24  SpO2: 94% 95%   Physical Exam Vitals and nursing note reviewed.  Constitutional:      General: He is in acute distress.     Comments: Frail, physically deconditioned  elderly male, awake, alert but dyspneic, coarse respiration  HENT:     Head: Normocephalic and atraumatic.  Cardiovascular:     Rate and Rhythm: Regular rhythm. Tachycardia present.     Heart sounds: Normal heart sounds.  Pulmonary:     Effort: Tachypnea and accessory muscle usage present.     Breath sounds: Wheezing present.  Abdominal:     Palpations: Abdomen is soft.     Tenderness: There is no abdominal tenderness.  Neurological:     Mental Status: Mental status is at baseline.     Labs on Admission: I have personally reviewed following labs and imaging studies  CBC: Recent Labs  Lab 02/20/22 0327 02/21/22 0244 02/22/22 0543 02/23/22 0552 02/23/22 2237  WBC 22.0* 15.2* 13.3* 12.5* 19.5*  NEUTROABS 18.7* 11.9* 9.2* 9.4* 16.7*  HGB 12.6* 13.0 13.9 12.9*  14.8  HCT 42.1 41.9 44.0 42.0 47.7  MCV 99.5 97.7 94.8 96.3 97.3  PLT 152 160 164 151 938   Basic Metabolic Panel: Recent Labs  Lab 02/19/22 0255 02/20/22 0327 02/21/22 0244 02/22/22 0543 02/23/22 0552 02/23/22 2237  NA 143 145 146* 145 151* 146*  K 4.2 4.0 3.9 3.7 3.6 4.5  CL 105 107 107 110 111 107  CO2 '30 30 31 31 '$ 33* 32  GLUCOSE 121* 127* 91 96 102* 170*  BUN 20 29* 27* '21 15 15  '$ CREATININE 0.99 0.99 0.85 0.86 0.85 0.87  CALCIUM 8.4* 8.3* 8.2* 8.3* 8.3* 8.5*  MG 2.0 2.2 2.3 2.1 2.1  --   PHOS  --  4.0 2.2* 2.0* 3.6  --    GFR: Estimated Creatinine Clearance: 62.1 mL/min (by C-G formula based on SCr of 0.87 mg/dL). Liver Function Tests: Recent Labs  Lab 02/19/22 0255 02/20/22 0327 02/21/22 0244 02/22/22 0543 02/23/22 0552  AST '15 15 17 '$ 39 24  ALT '11 11 11 24 23  '$ ALKPHOS 65 66 71 70 63  BILITOT 1.6* 0.8 1.0 1.0 0.8  PROT 5.4* 5.3* 5.3* 5.4* 5.1*  ALBUMIN 2.6* 2.2* 2.3* 2.5* 2.3*   No results for input(s): "LIPASE", "AMYLASE" in the last 168 hours. No results for input(s): "AMMONIA" in the last 168 hours. Coagulation Profile: Recent Labs  Lab 02/19/22 0100  INR 2.2*   Cardiac  Enzymes: No results for input(s): "CKTOTAL", "CKMB", "CKMBINDEX", "TROPONINI" in the last 168 hours. BNP (last 3 results) No results for input(s): "PROBNP" in the last 8760 hours. HbA1C: No results for input(s): "HGBA1C" in the last 72 hours. CBG: No results for input(s): "GLUCAP" in the last 168 hours. Lipid Profile: No results for input(s): "CHOL", "HDL", "LDLCALC", "TRIG", "CHOLHDL", "LDLDIRECT" in the last 72 hours. Thyroid Function Tests: No results for input(s): "TSH", "T4TOTAL", "FREET4", "T3FREE", "THYROIDAB" in the last 72 hours. Anemia Panel: No results for input(s): "VITAMINB12", "FOLATE", "FERRITIN", "TIBC", "IRON", "RETICCTPCT" in the last 72 hours. Urine analysis:    Component Value Date/Time   COLORURINE YELLOW (A) 02/19/2022 0130   APPEARANCEUR CLEAR (A) 02/19/2022 0130   APPEARANCEUR Clear 11/21/2017 1011   LABSPEC >1.046 (H) 02/19/2022 0130   LABSPEC 1.013 09/20/2012 0122   PHURINE 6.0 02/19/2022 0130   GLUCOSEU NEGATIVE 02/19/2022 0130   GLUCOSEU Negative 09/20/2012 0122   HGBUR NEGATIVE 02/19/2022 0130   BILIRUBINUR NEGATIVE 02/19/2022 0130   BILIRUBINUR Negative 11/21/2017 1011   BILIRUBINUR Negative 09/20/2012 0122   KETONESUR NEGATIVE 02/19/2022 0130   PROTEINUR NEGATIVE 02/19/2022 0130   NITRITE NEGATIVE 02/19/2022 0130   LEUKOCYTESUR NEGATIVE 02/19/2022 0130   LEUKOCYTESUR Negative 09/20/2012 0122    Radiological Exams on Admission: DG Chest Portable 1 View  Result Date: 02/23/2022 CLINICAL DATA:  Shortness of breath. EXAM: PORTABLE CHEST 1 VIEW COMPARISON:  February 18, 2022 FINDINGS: The heart size and mediastinal contours are within normal limits. There is moderate severity calcification of the aortic arch. Diffuse, chronic appearing increased lung markings are seen. Mild, stable atelectasis and/or infiltrate is noted within the left lung base. Very small, stable bilateral pleural effusions are noted. No pneumothorax is identified. Postoperative  changes are seen within the lower cervical spine and thoracolumbar spine. IMPRESSION: 1. Mild, stable left basilar atelectasis and/or infiltrate. 2. Very small, stable bilateral pleural effusions. Electronically Signed   By: Virgina Norfolk M.D.   On: 02/23/2022 22:39     Data Reviewed: Relevant notes from primary care and specialist visits, past discharge  summaries as available in EHR, including Care Everywhere. Prior diagnostic testing as pertinent to current admission diagnoses Updated medications and problem lists for reconciliation ED course, including vitals, labs, imaging, treatment and response to treatment Triage notes, nursing and pharmacy notes and ED provider's notes Notable results as noted in HPI   Assessment and Plan: * Recurrent aspiration pneumonia ([place]) Acute respiratory failure with hypoxia Esophageal stricture with high risk of aspiration-previously declined feeding tube WBC 19,000 and requiring NRB to maintain sats in the mid 90s.  Was discharged a few hours earlier on room air Suspect acute aspiration episode Keep n.p.o. Will continue vancomycin and cefepime Aspiration precautions Continue supplemental oxygen Might consider Solu-Medrol for  anti-inflammatory effect related to aspiration pneumonitis Patient was seen by palliative care on 1/17   CAD (coronary artery disease) Holding all meds due to suspicion for aspiration  Benign prostatic hyperplasia without lower urinary tract symptoms Holding meds tonight due to n.p.o. 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 Therapeutic Lovenox while n.p.o. CTA chest PE protocol on 1/15 showed a chronic left lower lobe embolus  Anxiety Holding meds due to n.p.o. status  Essential hypertension, benign Hydralazine IV as needed while  n.p.o.        DVT prophylaxis: Lovenox therapeutic dose while NPO  Consults: none  Advance Care Planning: DNR  Family Communication: Daughter June Parrish  Disposition Plan: Back to previous home environment  Severity of Illness: The appropriate patient status for this patient is INPATIENT. Inpatient status is judged to be reasonable and necessary in order to provide the required intensity of service to ensure the patient's safety. The patient's presenting symptoms, physical exam findings, and initial radiographic and laboratory data in the context of their chronic comorbidities is felt to place them at high risk for further clinical deterioration. Furthermore, it is not anticipated that the patient will be medically stable for discharge from the hospital within 2 midnights of admission.   * I certify that at the point of admission it is my clinical judgment that the patient will require inpatient hospital care spanning beyond 2 midnights from the point of admission due to high intensity of service, high risk for further deterioration and high frequency of surveillance required.*  Author: Athena Masse, MD 02/24/2022 12:29 AM  For on call review [email or web address].

## 2022-02-24 NOTE — Hospital Course (Signed)
Acute on chronic respiratory failure with hypoxia [facility])  Patient presented with worsening oxygen requirement, uses 1 L at baseline, mostly at night and as needed.  Currently on 4L of oxygen which I turned down to 2L and notified RN. History of COPD, so goal O2 sat is >90%. Multifactorial with recent COVID-19 pneumonia with superadded bacterial infection.  Also high risk for aspiration, and concern of aspiration pneumonia. -Continue with supplemental oxygen-wean to baseline as tolerated   Sepsis with acute hypoxic respiratory failure without septic shock [facility]) Patient met sepsis criteria with leukocytosis, tachycardia and tachypnea.  Mild lactic acidosis which has been resolved.  Most likely secondary to COVID-19 pneumonia with superadded bacterial infection/aspiration pneumonia as patient is high risk for aspiration.  He was started on broad-spectrum antibiotics which include Zosyn and vancomycin.  Procalcitonin at 0.38 and MRSA PCR negative. Patient is also high risk for aspiration based on his history of chronic esophageal dysmotility which is being managed at [facility] and frequent aspirations. Swallow team placed the recommendations as patient would like to continue eating and knows the risk. - currently on Unasyn and Zithromax. - Palliative care has discussed with patient and family on 1/17 - Continue with supportive care   At high risk for aspiration Chronic issue, previous MD discussed with daughter on phone and family is aware that he is high risk for a major aspiration which can result in termination of life. They discussed with palliative care on 1/17. They confirmed DNR status, and that they would like him to continue to eat as this gives him a lot of pleasure. They would like to involve his pulmonologist Dr. Murtis Sink sent to Dr. Lanney Gins by Dr. Reesa Chew -Palliative care consult as above   COVID-19 virus infection Patient was placed on remdesivir due to increased oxygen requirement  which can be multifactorial at this time. -discussed with ID Dr. Baxter Flattery on 1/17 who based on very low positivity of PCR test suggests COVID maybe in recovery rather than acute infection. As such, we did DC remdesivir on 1/17. -Decadron for 1 week  -Continue with supportive care -Continue with supplemental oxygen-wean to baseline as tolerated   Chronic diastolic CHF (congestive heart failure) ([facility]) Clinically appears euvolemic with mildly elevated BNP at 193. Most recent echocardiogram done on 02/21/2021 was within normal limit. -Continue home low-dose torsemide. -Continue home metoprolol    History of orthostatic hypotension Patient was on midodrine and Florinef at home, not sure whether he was taking Florinef or not, so it was not continued.  Patient is also on Decadron at this time. -Continue midodrine   CAD (coronary artery disease) Elevated troponin.  No chest pain.  Troponin peaked at 437 and started trending down.  Most likely secondary to mismatch between demand and supply with acute on chronic respiratory failure with hypoxia, pneumonia and COVID infection. -Limited echocardiogram 1/16 to rule out any wall motion abnormalities, results below   Pulmonary emboli [facility]) CTA chest done yesterday was negative for acute PE. -Continue home Eliquis   Hyperlipidemia -Continue statin   Benign prostatic hyperplasia without lower urinary tract symptoms -Continue home Flomax

## 2022-02-24 NOTE — ED Notes (Signed)
EMS called at 4:45 about tranport  patient to hospice home after 7 pm

## 2022-02-24 NOTE — Assessment & Plan Note (Signed)
Holding meds due to n.p.o. status

## 2022-02-24 NOTE — ED Notes (Signed)
Phlebotomy in with patient for second set of BC.

## 2022-02-24 NOTE — Discharge Summary (Signed)
Discharge Summary  Chris Kent:[id number] DOB: Feb 27, 1935  PCP: Idelle Crouch, MD  Admit date: 02/23/2022 Discharge date: 02/24/2022  Discharge Diagnoses:  Active Hospital Problems   Diagnosis Date Noted   Recurrent aspiration pneumonia ([place]) 07/18/2021    Priority: High   Acute respiratory failure with hypoxia ([place]) 02/23/2022    Priority: Medium    Esophageal stricture 07/16/2021    Priority: Low   CAD (coronary artery disease) 02/20/2021    Priority: 7.   Benign prostatic hyperplasia without lower urinary tract symptoms 03/18/2012    Priority: 10.   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   Discharge Condition: Stable Discharge Disposition: Inpatient Hospice   Diet recommendation: Diet Orders (From admission, onward)     Start     Ordered   02/24/22 0854  Diet regular Room service appropriate? Yes; Fluid consistency: Thin  Diet effective now       Question Answer Comment  Room service appropriate? Yes   Fluid consistency: Thin      02/24/22 0853           HPI and Brief Hospital Course:  87 year old male with known history of dementia, anxiety, arthritis, asthma, CHF on 2 L oxygen at night who was admitted at this facility from 1/15 to 1/20 due to hypoxic respiratory failure as result of COVID-19 infection, and aspiration pneumonia. Patient has esophageal stricture and very high risk of aspiration. During his last hospital stay, discussed with patient and family his very high risk of recurrent aspiration, after discussion with palliative care patient was made DNR with anticipated eventual transition to home hospice care, however family at that time was not ready  for transition to comfort care. Patient was discharged home on 1/20, likely had a recurrent aspiration event and returned to the hospital later that evening due to recurrent hypoxia. Detailed discussion with his daughter June and she would like to pursue hospice.  He has been evaluated by the hospice provider, and accepted for inpatient hospice admission tonight.  Discharge details, plan of care and follow up instructions were discussed with the patient's daughter over the phone this AM. Patient and family are in agreement with discharge from the hospital today and all questions were answered to their satisfaction.  Discharge Exam: BP (!) 100/53   Pulse 76   Temp 98 F (36.7 C) (Oral)   Resp (!) 26   Ht '5\' 9"'$  (1.753 m)   Wt 72.6 kg   SpO2 95%   BMI 23.63 kg/m  General:  Alert, oriented, calm, in no acute distress  Eyes: EOMI, clear sclerea Neck: supple, no masses, trachea mildline  Cardiovascular: RRR, no murmurs or rubs, no peripheral edema  Respiratory: clear to auscultation bilaterally, no wheezes, no crackles  Abdomen: soft, nontender, nondistended, normal bowel tones heard  Skin: dry, no rashes  Musculoskeletal: no joint effusions, normal range of motion  Psychiatric: appropriate affect, normal speech  Neurologic: extraocular muscles intact, clear speech, moving all extremities with intact sensorium   Discharge Instructions You were cared for by a hospitalist during your hospital stay. If you have any questions about your discharge medications or the care you received while you were in the hospital after you are discharged,  you can call the unit and asked to speak with the hospitalist on call if the hospitalist that took care of you is not available. Once you are discharged, your primary care physician will handle any further medical issues. Please note that NO REFILLS for any discharge medications will be authorized once you are discharged, as it is imperative that you return to your  primary care physician (or establish a relationship with a primary care physician if you do not have one) for your aftercare needs so that they can reassess your need for medications and monitor your lab values.   Allergies as of 02/24/2022   No Known Allergies      Medication List     STOP taking these medications    amoxicillin-clavulanate 500-125 MG tablet Commonly known as: Augmentin   apixaban 5 MG Tabs tablet Commonly known as: ELIQUIS   calcium carbonate 1500 (600 Ca) MG Tabs tablet Commonly known as: OSCAL   dexamethasone 6 MG tablet Commonly known as: DECADRON   dorzolamidel-timolol 22.3-6.8 MG/ML Soln ophthalmic solution Commonly known as: COSOPT   ergocalciferol 1.25 MG (50000 UT) capsule Commonly known as: VITAMIN D2   ferrous sulfate 324 MG Tbec   latanoprost 0.005 % ophthalmic solution Commonly known as: XALATAN   pantoprazole 40 MG tablet Commonly known as: Protonix   potassium chloride SA 20 MEQ tablet Commonly known as: KLOR-CON M   pyridostigmine 60 MG tablet Commonly known as: MESTINON   rosuvastatin 10 MG tablet Commonly known as: CRESTOR   torsemide 20 MG tablet Commonly known as: DEMADEX   Vitamin B 12 500 MCG Tabs   zinc sulfate 220 (50 Zn) MG capsule       TAKE these medications    metoCLOPramide 10 MG tablet Commonly known as: REGLAN Take 10 mg by mouth every 6 (six) hours as needed.   metoprolol succinate 25 MG 24 hr tablet Commonly known as: TOPROL-XL Take 12.5 mg by mouth daily.   midodrine 10 MG tablet Commonly known as: PROAMATINE Take 10 mg by mouth 3 (three) times daily.   tamsulosin 0.4 MG Caps capsule Commonly known as: FLOMAX Take 0.4 mg by mouth daily.       No Known Allergies   The results of significant diagnostics from this hospitalization (including imaging, microbiology, ancillary and laboratory) are listed below for reference.    Significant Diagnostic Studies: DG Chest Portable 1  View  Result Date: 02/23/2022 CLINICAL DATA:  Shortness of breath. EXAM: PORTABLE CHEST 1 VIEW COMPARISON:  February 18, 2022 FINDINGS: The heart size and mediastinal contours are within normal limits. There is moderate severity calcification of the aortic arch. Diffuse, chronic appearing increased lung markings are seen. Mild, stable atelectasis and/or infiltrate is noted within the left lung base. Very small, stable bilateral pleural effusions are noted. No pneumothorax is identified. Postoperative changes are seen within the lower cervical spine and thoracolumbar spine. IMPRESSION: 1. Mild, stable left basilar atelectasis and/or infiltrate. 2. Very small, stable bilateral pleural effusions. Electronically Signed   By: Virgina Norfolk M.D.   On: 02/23/2022 22:39   ECHOCARDIOGRAM LIMITED  Result Date: 02/19/2022    ECHOCARDIOGRAM LIMITED REPORT   Patient Name:   DICK HARK Date of Exam: 02/19/2022 Medical Rec #:  [id number]        Height:       69.0 in Accession #:    [id number]       Weight:       179.9 lb Date of Birth:  11/26/1935        BSA:          1.975 m Patient Age:    46 years         BP:           90/53 mmHg Patient Gender: M                HR:           69 bpm. Exam Location:  [facility] Procedure: Limited Echo, Cardiac Doppler, Color Doppler and Intracardiac            Opacification Agent Indications:     elevated troponin  History:         Patient has prior history of Echocardiogram examinations, most                  recent 02/21/2021. CHF, CAD; Risk Factors:Hypertension and                  Dyslipidemia.  Sonographer:     Harvie Junior Referring Phys:  [id number] ZJQBHAL AMIN Diagnosing Phys: Nelva Bush MD  Sonographer Comments: Technically difficult study due to poor echo windows, no parasternal window and suboptimal apical window. Image acquisition challenging due to respiratory motion. IMPRESSIONS  1. Left ventricular ejection fraction, by estimation, is 55 to 60%. The left ventricle has  normal function. Left ventricular endocardial border not optimally defined to evaluate regional wall motion. Left ventricular diastolic parameters are consistent with Grade I diastolic dysfunction (impaired relaxation).  2. Right ventricular systolic function was not well visualized. The right ventricular size is not well visualized.  3. A small pericardial effusion is present. The pericardial effusion is surrounding the apex.  4. The inferior vena cava is normal in size with greater than 50% respiratory variability, suggesting right atrial pressure of 3 mmHg. FINDINGS  Left Ventricle: Left ventricular ejection fraction, by estimation, is 55 to 60%. The left ventricle has normal function. Left ventricular endocardial border not optimally defined to evaluate regional wall motion. Definity contrast agent was given IV to delineate the left ventricular endocardial borders. Left ventricular diastolic parameters are consistent with Grade I diastolic dysfunction (impaired relaxation). Right Ventricle: The right ventricular size is not well visualized. Right vetricular wall thickness was not well visualized. Right ventricular systolic function was not well visualized. Pericardium: A small pericardial effusion is present. The pericardial effusion is surrounding the apex. Aortic Valve: Aortic valve mean gradient measures 2.0 mmHg. Aortic valve peak gradient measures 3.3 mmHg. Venous: The inferior vena cava is normal in size with greater than 50% respiratory variability, suggesting right atrial pressure of 3 mmHg. IAS/Shunts: No atrial level shunt detected by color flow Doppler.  LV Volumes (MOD) LV vol d, MOD A2C: 43.1 ml Diastology LV vol d, MOD A4C: 84.5 ml LV e' medial:    4.46 cm/s LV vol s, MOD A2C: 19.1 ml LV E/e' medial:  14.6 LV vol s, MOD A4C: 36.4 ml LV e' lateral:   6.31 cm/s LV SV MOD A2C:     24.0 ml LV E/e' lateral: 10.3 LV SV MOD A4C:     84.5 ml LV SV MOD BP:      34.7 ml                             3D Volume  EF:  3D EF:        51 %                            LV EDV:       320 ml                            LV ESV:       158 ml                            LV SV:        161 ml RIGHT VENTRICLE RV S prime:     11.70 cm/s AORTIC VALVE AV Vmax:           91.40 cm/s AV Vmean:          62.600 cm/s AV VTI:            0.165 m AV Peak Grad:      3.3 mmHg AV Mean Grad:      2.0 mmHg LVOT Vmax:         75.50 cm/s LVOT Vmean:        48.200 cm/s LVOT VTI:          0.121 m LVOT/AV VTI ratio: 0.73 MITRAL VALVE MV Area (PHT): 3.54 cm    SHUNTS MV Decel Time: 214 msec    Systemic VTI: 0.12 m MV E velocity: 65.00 cm/s MV A velocity: 76.50 cm/s MV E/A ratio:  0.85 Harrell Gave End MD Electronically signed by Nelva Bush MD Signature Date/Time: 02/19/2022/6:16:54 PM    Final    CT Angio Chest PE W/Cm &/Or Wo Cm  Result Date: 02/18/2022 CLINICAL DATA:  Shortness of breath EXAM: CT ANGIOGRAPHY CHEST WITH CONTRAST TECHNIQUE: Multidetector CT imaging of the chest was performed using the standard protocol during bolus administration of intravenous contrast. Multiplanar CT image reconstructions and MIPs were obtained to evaluate the vascular anatomy. RADIATION DOSE REDUCTION: This exam was performed according to the departmental dose-optimization program which includes automated exposure control, adjustment of the mA and/or kV according to patient size and/or use of iterative reconstruction technique. CONTRAST:  49m OMNIPAQUE IOHEXOL 350 MG/ML SOLN COMPARISON:  11/19/2021 FINDINGS: Cardiovascular: Contrast injection is sufficient to demonstrate satisfactory opacification of the pulmonary arteries to the segmental level. There is no acute pulmonary embolus or evidence of right heart strain. Chronic filling defect of the left lower lobe artery is unchanged. The size of the main pulmonary artery is normal. Small pericardial effusion. There is calcific aortic atherosclerosis. There are coronary artery calcifications.  Mediastinum/Nodes: Patulous and debris containing esophagus. No lymphadenopathy. Lungs/Pleura: Unchanged 4 mm right middle lobe nodule (5:73). Multifocal bilateral airspace opacities, greatest in the right lower lobe. Bibasilar atelectasis. Upper Abdomen: Contrast bolus timing is not optimized for evaluation of the abdominal organs. The visualized portions of the organs of the upper abdomen are normal. Musculoskeletal: Ankylosis and multilevel posterior instrumented fusion. No acute abnormality. Review of the MIP images confirms the above findings. IMPRESSION: 1. No acute pulmonary embolus. 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 2. Multifocal bilateral airspace opacities, greatest in the right lower lobe, concerning for multifocal pneumonia. 3. Patulous and debris containing esophagus. 4. Small pericardial effusion. 5. Unchanged 4 mm right middle lobe nodule.  No follow-up required. Aortic Atherosclerosis (ICD10-I70.0). Electronically Signed   By: KUlyses JarredM.D.   On:  02/18/2022 21:32   DG Chest Portable 1 View  Result Date: 02/18/2022 CLINICAL DATA:  Shortness of breath EXAM: PORTABLE CHEST 1 VIEW COMPARISON:  Chest radiograph 07/20/2021, CT chest 11/19/2021 FINDINGS: The cardiomediastinal silhouette is within normal limits There are diffusely increased interstitial markings with patchy opacities in the lung bases, right more than left. There is a possible trace right pleural effusion. There is no pneumothorax There is no acute osseous abnormality. Spinal fusion hardware is again noted. IMPRESSION: Coarsened interstitial markings in both lungs with patchy opacities in the lung bases, right worse than left, suspicious for multifocal infection in the correct clinical setting. Suspect trace right pleural effusion. Electronically Signed   By: Valetta Mole M.D.   On: 02/18/2022 19:57    Microbiology: Recent Results (from the past 240 hour(s))  Resp panel by RT-PCR (RSV, Flu A&B, Covid) Anterior Nasal  Swab     Status: Abnormal   Collection Time: 02/18/22  7:08 PM   Specimen: Anterior Nasal Swab  Result Value Ref Range Status   SARS Coronavirus 2 by RT PCR POSITIVE (A) NEGATIVE Final    Comment: (NOTE) SARS-CoV-2 target nucleic acids are DETECTED.  The SARS-CoV-2 RNA is generally detectable in upper respiratory specimens during the acute phase of infection. Positive results are indicative of the presence of the identified virus, but do not rule out bacterial infection or co-infection with other pathogens not detected by the test. Clinical correlation with patient history and other diagnostic information is necessary to determine patient infection status. The expected result is Negative.  Fact Sheet for Patients: EntrepreneurPulse.com.au  Fact Sheet for Healthcare Providers: IncredibleEmployment.be  This test is not yet approved or cleared by the [place] FDA and  has been authorized for detection and/or diagnosis of SARS-CoV-2 by FDA under an Emergency Use Authorization (EUA).  This EUA will remain in effect (meaning this test can be used) for the duration of  the COVID-19 declaration under Section 564(b)(1) of the A ct, 21 U.S.C. section 360bbb-3(b)(1), unless the authorization is terminated or revoked sooner.     Influenza A by PCR NEGATIVE NEGATIVE Final   Influenza B by PCR NEGATIVE NEGATIVE Final    Comment: (NOTE) The Xpert Xpress SARS-CoV-2/FLU/RSV plus assay is intended as an aid in the diagnosis of influenza from Nasopharyngeal swab specimens and should not be used as a sole basis for treatment. Nasal washings and aspirates are unacceptable for Xpert Xpress SARS-CoV-2/FLU/RSV testing.  Fact Sheet for Patients: EntrepreneurPulse.com.au  Fact Sheet for Healthcare Providers: IncredibleEmployment.be  This test is not yet approved or cleared by the [place] FDA and has been authorized  for detection and/or diagnosis of SARS-CoV-2 by FDA under an Emergency Use Authorization (EUA). This EUA will remain in effect (meaning this test can be used) for the duration of the COVID-19 declaration under Section 564(b)(1) of the Act, 21 U.S.C. section 360bbb-3(b)(1), unless the authorization is terminated or revoked.     Resp Syncytial Virus by PCR NEGATIVE NEGATIVE Final    Comment: (NOTE) Fact Sheet for Patients: EntrepreneurPulse.com.au  Fact Sheet for Healthcare Providers: IncredibleEmployment.be  This test is not yet approved or cleared by the [place] FDA and has been authorized for detection and/or diagnosis of SARS-CoV-2 by FDA under an Emergency Use Authorization (EUA). This EUA will remain in effect (meaning this test can be used) for the duration of the COVID-19 declaration under Section 564(b)(1) of the Act, 21 U.S.C. section 360bbb-3(b)(1), unless the authorization is terminated or revoked.  Performed  at [facility], [address] [place], [place] [address]   Blood culture (routine x 2)     Status: None   Collection Time: 02/18/22  7:14 PM   Specimen: BLOOD  Result Value Ref Range Status   Specimen Description BLOOD RIGHT ANTECUBITAL  Final   Special Requests   Final    BOTTLES DRAWN AEROBIC AND ANAEROBIC Blood Culture results may not be optimal due to an excessive volume of blood received in culture bottles   Culture   Final    NO GROWTH 5 DAYS Performed at [facility], [address], [place], [place] [address]    Report Status 02/23/2022 FINAL  Final  Blood culture (routine x 2)     Status: None   Collection Time: 02/18/22  8:42 PM   Specimen: BLOOD  Result Value Ref Range Status   Specimen Description BLOOD RIGHT UPPER ARM  Final   Special Requests   Final    BOTTLES DRAWN AEROBIC AND ANAEROBIC Blood Culture adequate volume   Culture   Final    NO GROWTH 5 DAYS Performed at [facility], [address] [place], [place] [address]    Report Status 02/23/2022 FINAL  Final  MRSA Next Gen by PCR, Nasal     Status: None   Collection Time: 02/19/22  8:05 AM   Specimen: Nasal Mucosa; Nasal Swab  Result Value Ref Range Status   MRSA by PCR Next Gen NOT DETECTED NOT DETECTED Final    Comment: (NOTE) The GeneXpert MRSA Assay (FDA approved for NASAL specimens only), is one component of a comprehensive MRSA colonization surveillance program. It is not intended to diagnose MRSA infection nor to guide or monitor treatment for MRSA infections. Test performance is not FDA approved in patients less than 4 years old. Performed at [facility], [address] [place], [place] [address]   Blood culture (routine x 2)     Status: None (Preliminary result)   Collection Time: 02/23/22 11:38 PM   Specimen: BLOOD  Result Value Ref Range Status   Specimen Description BLOOD BLOOD RIGHT ARM  Final   Special Requests   Final    BOTTLES DRAWN AEROBIC AND ANAEROBIC Blood Culture adequate volume   Culture   Final    NO GROWTH < 12 HOURS Performed at [facility], [address], [place], [place] [address]    Report Status PENDING  Incomplete  Blood culture (routine x 2)     Status: None (Preliminary result)   Collection Time: 02/24/22 12:29 AM   Specimen: BLOOD  Result Value Ref Range Status   Specimen Description BLOOD BLOOD RIGHT HAND  Final   Special Requests   Final    BOTTLES DRAWN AEROBIC AND ANAEROBIC Blood Culture adequate volume   Culture   Final    NO GROWTH < 12 HOURS Performed at [facility], [address] [place],  [address]    Report Status PENDING  Incomplete  MRSA Next Gen by PCR, Nasal     Status: None   Collection Time: 02/24/22 10:17 AM   Specimen: Nasal Mucosa; Nasal Swab  Result Value Ref Range Status   MRSA by PCR Next Gen NOT DETECTED NOT DETECTED Final    Comment: (NOTE) The GeneXpert MRSA Assay (FDA  approved for NASAL specimens only), is one component of a comprehensive MRSA colonization surveillance program. It is not intended to diagnose MRSA infection nor to guide or monitor treatment for  MRSA infections. Test performance is not FDA approved in patients less than 28 years old. Performed at [facility], [address] [place], [place] [address]      Labs: Basic Metabolic Panel: Recent Labs  Lab 02/19/22 0255 02/20/22 0327 02/21/22 0244 02/22/22 0543 02/23/22 0552 02/23/22 2237 02/24/22 0443  NA 143 145 146* 145 151* 146* 145  K 4.2 4.0 3.9 3.7 3.6 4.5 3.7  CL 105 107 107 110 111 107 108  CO2 '30 30 31 31 '$ 33* 32 32  GLUCOSE 121* 127* 91 96 102* 170* 118*  BUN 20 29* 27* '21 15 15 14  '$ CREATININE 0.99 0.99 0.85 0.86 0.85 0.87 0.86  CALCIUM 8.4* 8.3* 8.2* 8.3* 8.3* 8.5* 8.0*  MG 2.0 2.2 2.3 2.1 2.1  --   --   PHOS  --  4.0 2.2* 2.0* 3.6  --   --    Liver Function Tests: Recent Labs  Lab 02/19/22 0255 02/20/22 0327 02/21/22 0244 02/22/22 0543 02/23/22 0552  AST '15 15 17 '$ 39 24  ALT '11 11 11 24 23  '$ ALKPHOS 65 66 71 70 63  BILITOT 1.6* 0.8 1.0 1.0 0.8  PROT 5.4* 5.3* 5.3* 5.4* 5.1*  ALBUMIN 2.6* 2.2* 2.3* 2.5* 2.3*   No results for input(s): "LIPASE", "AMYLASE" in the last 168 hours. No results for input(s): "AMMONIA" in the last 168 hours. CBC: Recent Labs  Lab 02/20/22 0327 02/21/22 0244 02/22/22 0543 02/23/22 0552 02/23/22 2237 02/24/22 0443  WBC 22.0* 15.2* 13.3* 12.5* 19.5* 22.6*  NEUTROABS 18.7* 11.9* 9.2* 9.4* 16.7*  --   HGB 12.6* 13.0 13.9 12.9* 14.8 13.4  HCT 42.1 41.9 44.0 42.0 47.7 43.0  MCV 99.5 97.7 94.8 96.3 97.3 97.3  PLT 152 160 164 151 169 151   Cardiac Enzymes: No results for input(s): "CKTOTAL", "CKMB", "CKMBINDEX", "TROPONINI" in the last 168 hours. BNP: BNP (last 3 results) Recent Labs    02/18/22 1908  BNP 193.1*   ProBNP (last 3 results) No results for input(s): "PROBNP" in the last 8760 hours.  CBG: No  results for input(s): "GLUCAP" in the last 168 hours.  Time spent: > 30 minutes were spent in preparing this discharge including medication reconciliation, counseling, and coordination of care.  Signed:  Fredrika Canby Marry Guan, MD  [facility] 02/24/2022, 3:57 PM

## 2022-02-24 NOTE — ED Notes (Signed)
Pt changed out of shirt and into gown. Partial bed bath provided with bed wipes by this RN. Brief checked, is clean and dry at this time. Pt repositioned in bed for comfort. Call light remains within reach, [place].

## 2022-02-24 NOTE — Assessment & Plan Note (Addendum)
Downtrending from a few days prior Likely demand ischemia

## 2022-02-26 LAB — LEGIONELLA PNEUMOPHILA SEROGP 1 UR AG: L. pneumophila Serogp 1 Ur Ag: NEGATIVE

## 2022-02-28 LAB — CULTURE, BLOOD (ROUTINE X 2)
Culture: NO GROWTH
Special Requests: ADEQUATE

## 2022-03-01 LAB — CULTURE, BLOOD (ROUTINE X 2)
Culture: NO GROWTH
Special Requests: ADEQUATE

## 2022-03-07 DEATH — deceased

## 2022-03-19 ENCOUNTER — Ambulatory Visit: Payer: PPO | Admitting: Family

## 2022-07-03 ENCOUNTER — Ambulatory Visit: Payer: PPO | Admitting: Urology

## 2022-09-26 IMAGING — CT CT HEAD W/O CM
4 series · 17 of 47 positions shown, 19 images · non-contrast
Comparison: CT head 12/06/2020

CLINICAL DATA: Mental status change, unknown cause



[Series 2: head bone · axial · 0.48mm/px · z∈[-142,-82]mm · 4 of 86 slices shown]
[im 9/86  bone]
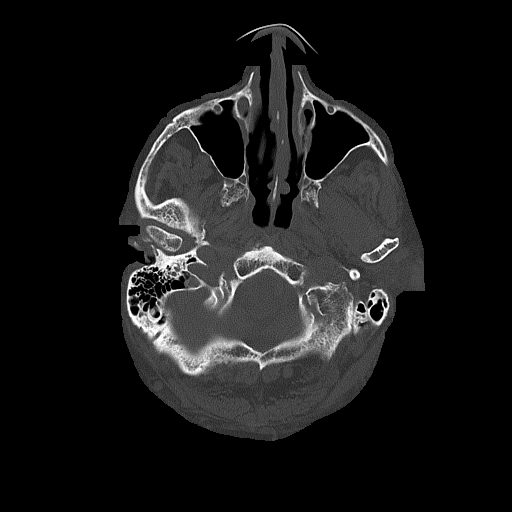
[im 18/86  bone]
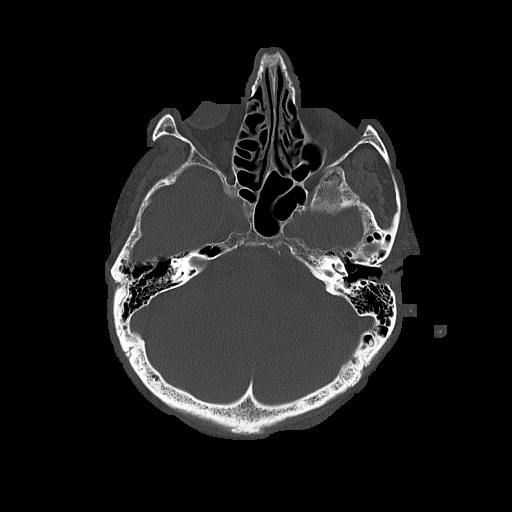
[im 26/86  bone]
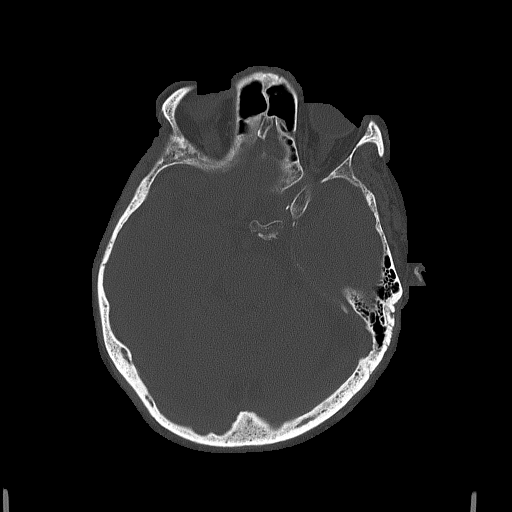
[im 39/86  bone]
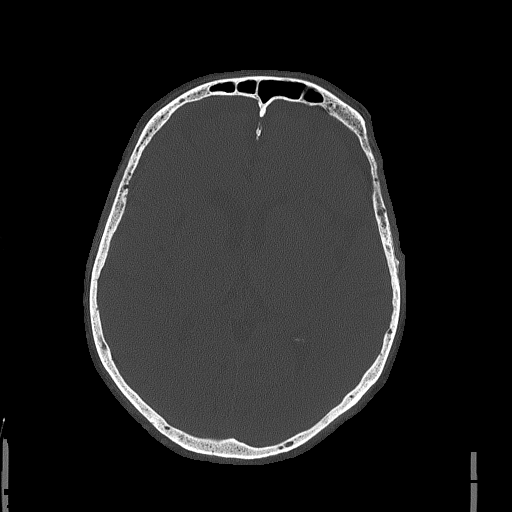

[Series 3: head wo · axial · 0.48mm/px · z∈[-138,-13]mm · 7 of 35 slices shown, 9 images]
[im 5/35  brain]
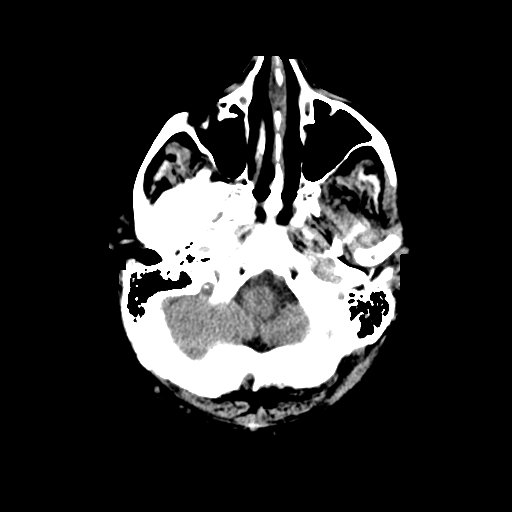
[im 5/35  bone]
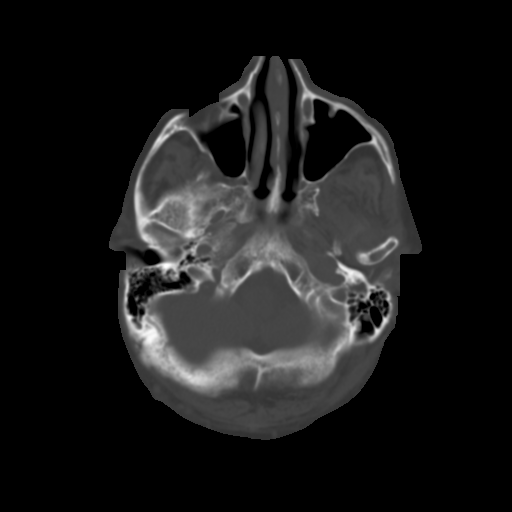
[im 9/35  brain]
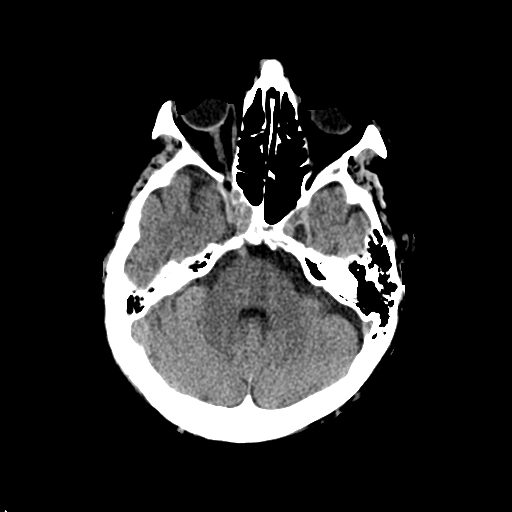
[im 13/35  brain]
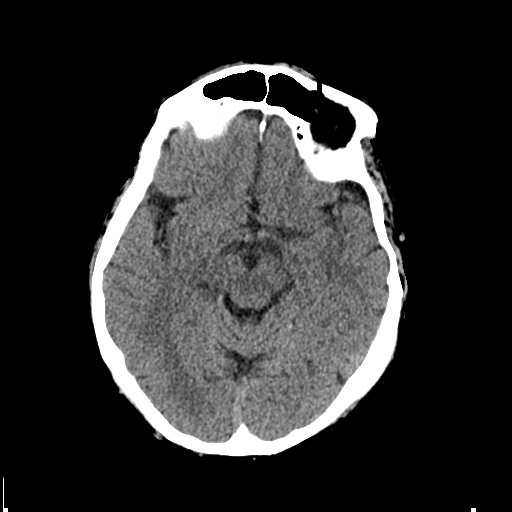
[im 18/35  brain]
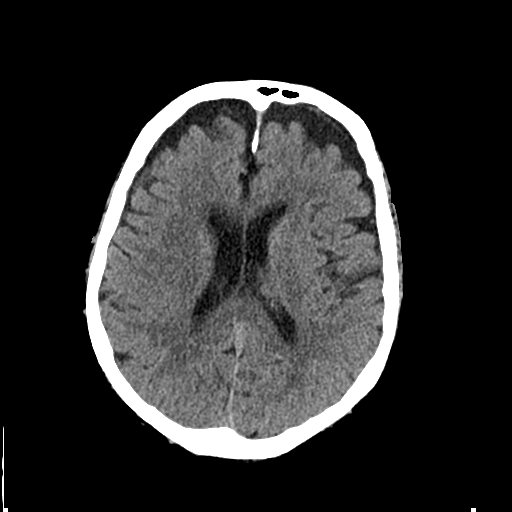
[im 22/35  brain]
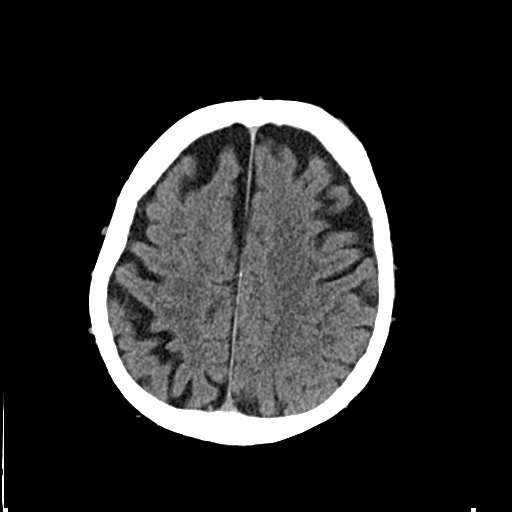
[im 22/35  bone]
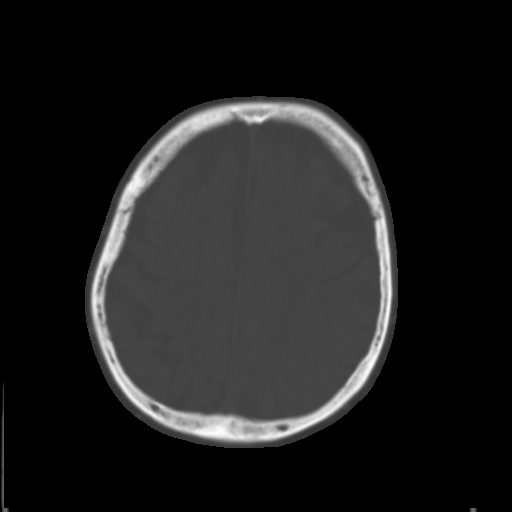
[im 26/35  brain]
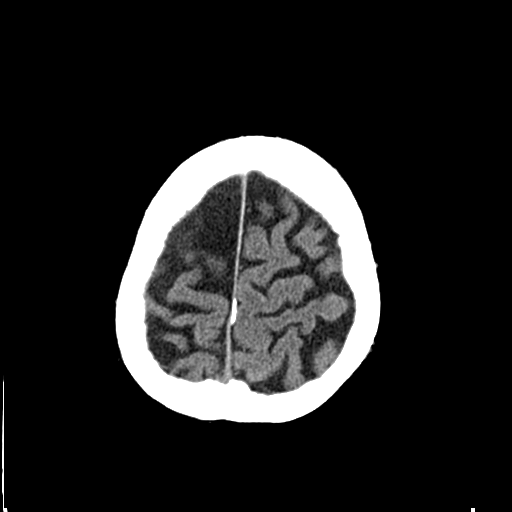
[im 30/35  brain]
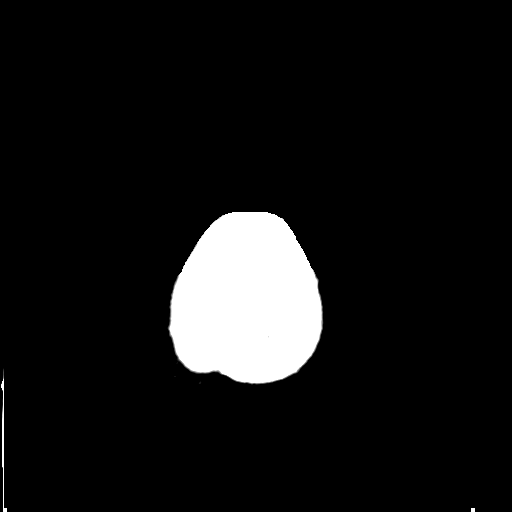

[Series 4: coronal soft tissue · coronal · 0.35mm/px · 3 of 67 slices shown]
[im 24/67  brain]
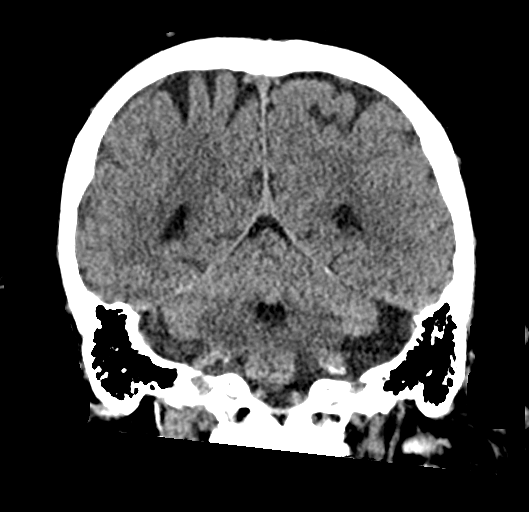
[im 30/67  brain]
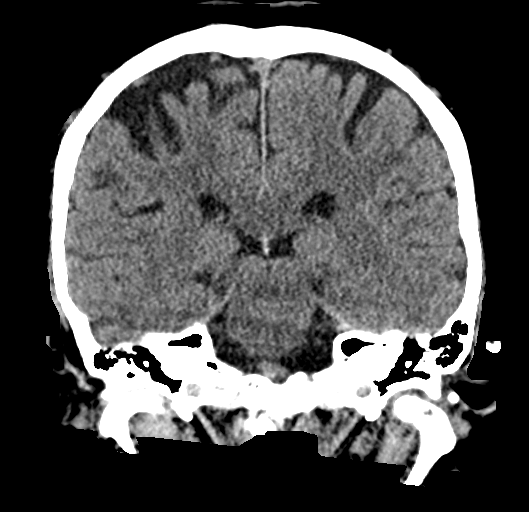
[im 37/67  brain]
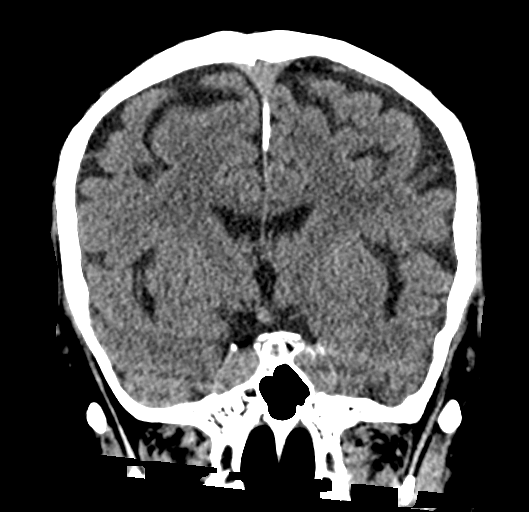

[Series 5: sagittal soft tissue · sagittal · 0.37mm/px · 3 of 59 slices shown]
[im 20/59  brain]
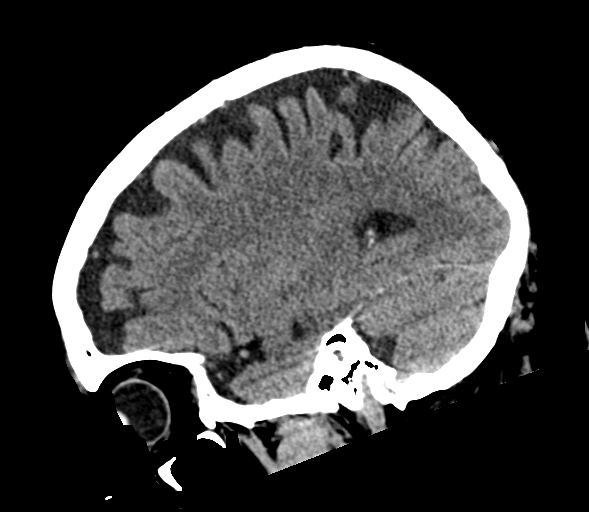
[im 30/59  brain]
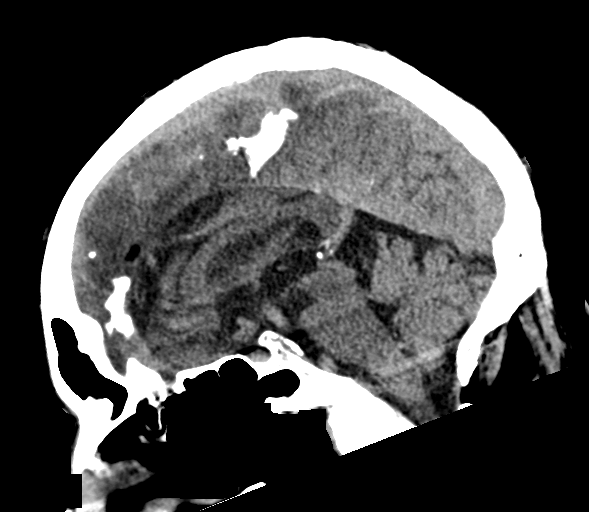
[im 39/59  brain]
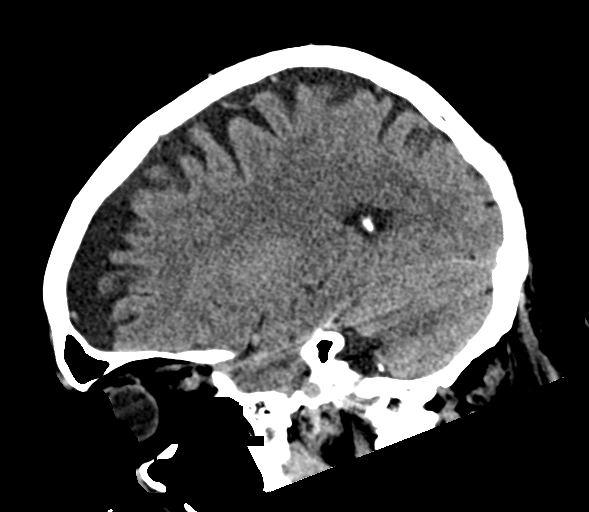

[17 of 47 positions shown; findings below may reference images not displayed]

BRAIN:
BRAIN
Patchy and confluent areas of decreased attenuation are noted
throughout the deep and periventricular white matter of the cerebral
hemispheres bilaterally, compatible with chronic microvascular
ischemic disease.

No evidence of large-territorial acute infarction. No parenchymal
hemorrhage. No mass lesion. No extra-axial collection.

No mass effect or midline shift. No hydrocephalus. Basilar cisterns
are patent.

Vascular: No hyperdense vessel. Atherosclerotic calcifications are
present within the cavernous internal carotid arteries.

Skull: No acute fracture or focal lesion.

Sinuses/Orbits: Paranasal sinuses and mastoid air cells are clear.
The orbits are unremarkable.

Other: None.
IMPRESSION: No acute intracranial abnormality.

## 2022-09-27 IMAGING — CR DG CHEST 2V
1 series · 2 of 2 positions shown · non-contrast
Comparison: Chest x-ray 02/20/2021

CLINICAL DATA: Lung nodule

EXAM:
CHEST - 2 VIEW

[Series 1: dg chest 2 view · 0.14mm/px · 2 of 2 slices shown]
[im 1/2]
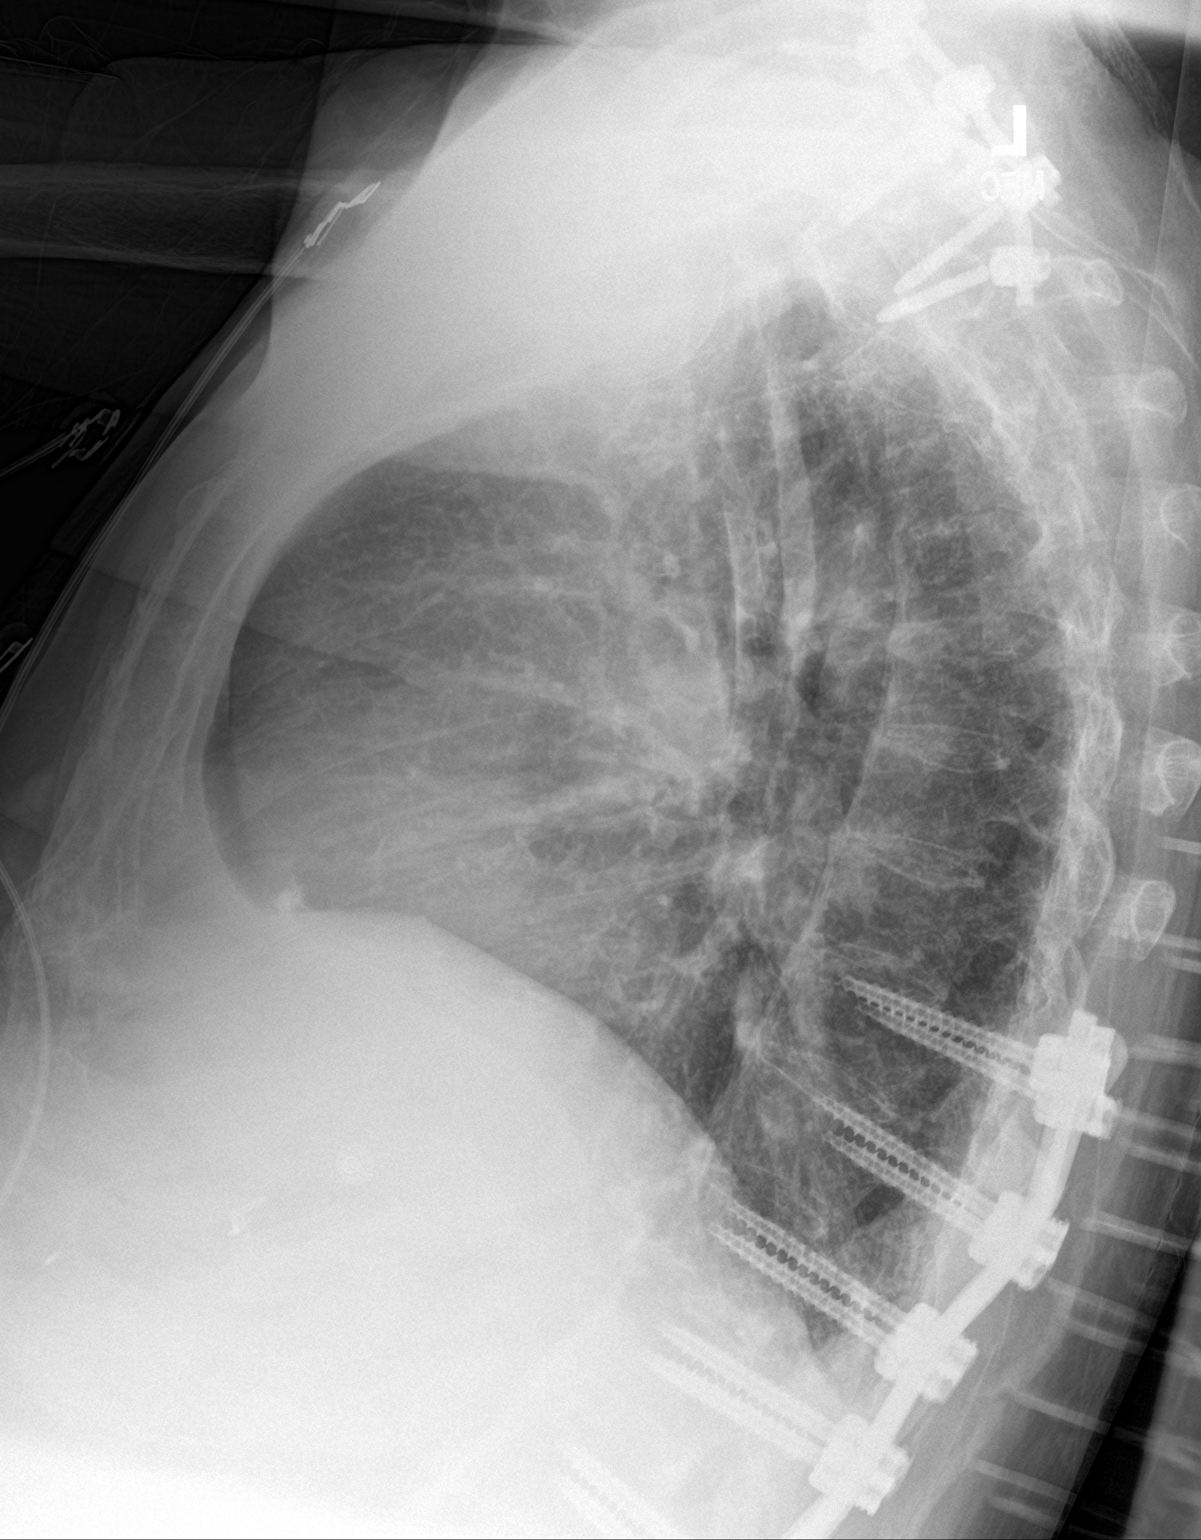
[im 2/2]
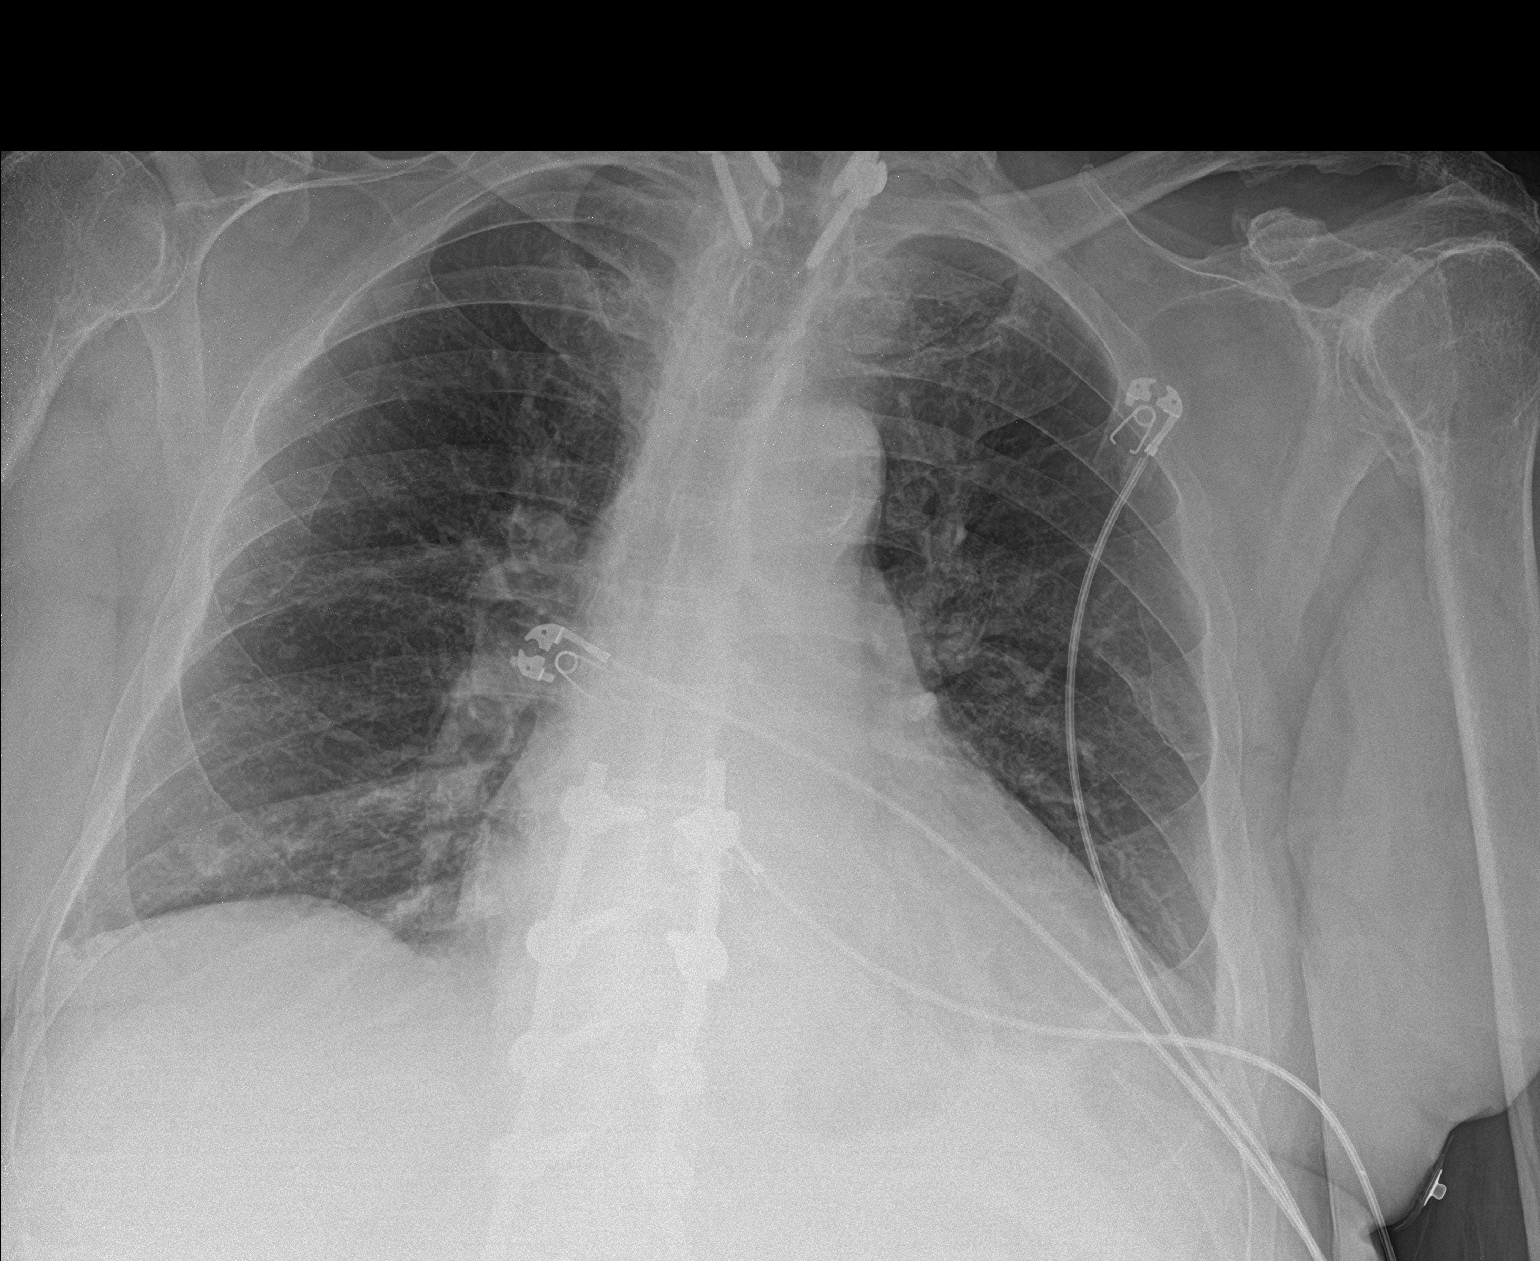

[2 of 2 positions shown; findings below may reference images not displayed]

FINDINGS: Cardiomegaly and mediastinum are unchanged. Calcified plaques in the
aortic arch. Pulmonary vasculature is normal. No focal consolidation
identified. No pleural effusion or pneumothorax. 8 mm somewhat
nodular opacity visualized in the right lower chest/lung zone, not
as well defined as on previous study, may represent a nipple shadow.
IMPRESSION: 1. No acute intrathoracic process identified.
2. 8 mm somewhat nodular opacity again seen in the right lower
chest, may represent a nipple shadow. Consider nipple markers on
future exams.
3. Cardiomegaly.
